# Patient Record
Sex: Female | Born: 1935 | Race: White | Hispanic: No | Smoking: Former smoker
Health system: Southern US, Community
[De-identification: ages and names within clinical notes are randomized; demographics above are authoritative.]

## PROBLEM LIST (undated history)

## (undated) DIAGNOSIS — I4891 Unspecified atrial fibrillation: Secondary | ICD-10-CM

## (undated) DIAGNOSIS — E039 Hypothyroidism, unspecified: Secondary | ICD-10-CM

## (undated) DIAGNOSIS — IMO0002 Reserved for concepts with insufficient information to code with codable children: Secondary | ICD-10-CM

## (undated) DIAGNOSIS — K52839 Microscopic colitis, unspecified: Secondary | ICD-10-CM

## (undated) DIAGNOSIS — M810 Age-related osteoporosis without current pathological fracture: Secondary | ICD-10-CM

## (undated) DIAGNOSIS — R55 Syncope and collapse: Secondary | ICD-10-CM

## (undated) DIAGNOSIS — J449 Chronic obstructive pulmonary disease, unspecified: Secondary | ICD-10-CM

## (undated) HISTORY — DX: Hypothyroidism, unspecified: E03.9

## (undated) HISTORY — DX: Reserved for concepts with insufficient information to code with codable children: IMO0002

## (undated) HISTORY — DX: Chronic obstructive pulmonary disease, unspecified: J44.9

## (undated) HISTORY — DX: Age-related osteoporosis without current pathological fracture: M81.0

## (undated) HISTORY — PX: LIPOMA EXCISION: SHX5283

## (undated) HISTORY — PX: MULTIPLE TOOTH EXTRACTIONS: SHX2053

## (undated) HISTORY — DX: Unspecified atrial fibrillation: I48.91

## (undated) HISTORY — DX: Syncope and collapse: R55

## (undated) HISTORY — PX: REFRACTIVE SURGERY: SHX103

## (undated) HISTORY — DX: Microscopic colitis, unspecified: K52.839

## (undated) HISTORY — PX: CATARACT EXTRACTION: SUR2

## (undated) HISTORY — PX: APPENDECTOMY: SHX54

## (undated) HISTORY — PX: COLONOSCOPY: SHX174

---

## 2010-06-14 LAB — HM COLONOSCOPY

## 2011-09-18 DIAGNOSIS — I872 Venous insufficiency (chronic) (peripheral): Secondary | ICD-10-CM | POA: Diagnosis not present

## 2011-09-18 DIAGNOSIS — L97809 Non-pressure chronic ulcer of other part of unspecified lower leg with unspecified severity: Secondary | ICD-10-CM | POA: Diagnosis not present

## 2011-09-18 DIAGNOSIS — E039 Hypothyroidism, unspecified: Secondary | ICD-10-CM | POA: Diagnosis not present

## 2011-09-18 DIAGNOSIS — Z7901 Long term (current) use of anticoagulants: Secondary | ICD-10-CM | POA: Diagnosis not present

## 2011-09-18 DIAGNOSIS — J449 Chronic obstructive pulmonary disease, unspecified: Secondary | ICD-10-CM | POA: Diagnosis not present

## 2011-09-18 DIAGNOSIS — I4891 Unspecified atrial fibrillation: Secondary | ICD-10-CM | POA: Diagnosis not present

## 2011-09-18 DIAGNOSIS — Z79899 Other long term (current) drug therapy: Secondary | ICD-10-CM | POA: Diagnosis not present

## 2011-09-20 DIAGNOSIS — L98499 Non-pressure chronic ulcer of skin of other sites with unspecified severity: Secondary | ICD-10-CM | POA: Diagnosis not present

## 2011-09-20 DIAGNOSIS — I70219 Atherosclerosis of native arteries of extremities with intermittent claudication, unspecified extremity: Secondary | ICD-10-CM | POA: Diagnosis not present

## 2011-09-20 DIAGNOSIS — I739 Peripheral vascular disease, unspecified: Secondary | ICD-10-CM | POA: Diagnosis not present

## 2011-09-24 DIAGNOSIS — J449 Chronic obstructive pulmonary disease, unspecified: Secondary | ICD-10-CM | POA: Diagnosis not present

## 2011-09-24 DIAGNOSIS — I87319 Chronic venous hypertension (idiopathic) with ulcer of unspecified lower extremity: Secondary | ICD-10-CM | POA: Diagnosis not present

## 2011-09-24 DIAGNOSIS — I872 Venous insufficiency (chronic) (peripheral): Secondary | ICD-10-CM | POA: Diagnosis not present

## 2011-09-24 DIAGNOSIS — Z79899 Other long term (current) drug therapy: Secondary | ICD-10-CM | POA: Diagnosis not present

## 2011-09-24 DIAGNOSIS — L97909 Non-pressure chronic ulcer of unspecified part of unspecified lower leg with unspecified severity: Secondary | ICD-10-CM | POA: Diagnosis not present

## 2011-09-24 DIAGNOSIS — L97809 Non-pressure chronic ulcer of other part of unspecified lower leg with unspecified severity: Secondary | ICD-10-CM | POA: Diagnosis not present

## 2011-09-24 DIAGNOSIS — I4891 Unspecified atrial fibrillation: Secondary | ICD-10-CM | POA: Diagnosis not present

## 2011-09-24 DIAGNOSIS — E039 Hypothyroidism, unspecified: Secondary | ICD-10-CM | POA: Diagnosis not present

## 2011-09-26 DIAGNOSIS — L909 Atrophic disorder of skin, unspecified: Secondary | ICD-10-CM | POA: Diagnosis not present

## 2011-09-26 DIAGNOSIS — D2310 Other benign neoplasm of skin of unspecified eyelid, including canthus: Secondary | ICD-10-CM | POA: Diagnosis not present

## 2011-09-26 DIAGNOSIS — L821 Other seborrheic keratosis: Secondary | ICD-10-CM | POA: Diagnosis not present

## 2011-09-26 DIAGNOSIS — L578 Other skin changes due to chronic exposure to nonionizing radiation: Secondary | ICD-10-CM | POA: Diagnosis not present

## 2011-09-26 DIAGNOSIS — L919 Hypertrophic disorder of the skin, unspecified: Secondary | ICD-10-CM | POA: Diagnosis not present

## 2011-10-01 DIAGNOSIS — L97809 Non-pressure chronic ulcer of other part of unspecified lower leg with unspecified severity: Secondary | ICD-10-CM | POA: Diagnosis not present

## 2011-10-01 DIAGNOSIS — I872 Venous insufficiency (chronic) (peripheral): Secondary | ICD-10-CM | POA: Diagnosis not present

## 2011-10-01 DIAGNOSIS — L97909 Non-pressure chronic ulcer of unspecified part of unspecified lower leg with unspecified severity: Secondary | ICD-10-CM | POA: Diagnosis not present

## 2011-10-01 DIAGNOSIS — Z79899 Other long term (current) drug therapy: Secondary | ICD-10-CM | POA: Diagnosis not present

## 2011-10-01 DIAGNOSIS — J449 Chronic obstructive pulmonary disease, unspecified: Secondary | ICD-10-CM | POA: Diagnosis not present

## 2011-10-01 DIAGNOSIS — I4891 Unspecified atrial fibrillation: Secondary | ICD-10-CM | POA: Diagnosis not present

## 2011-10-01 DIAGNOSIS — E039 Hypothyroidism, unspecified: Secondary | ICD-10-CM | POA: Diagnosis not present

## 2011-10-01 DIAGNOSIS — I87319 Chronic venous hypertension (idiopathic) with ulcer of unspecified lower extremity: Secondary | ICD-10-CM | POA: Diagnosis not present

## 2011-10-02 DIAGNOSIS — Z7901 Long term (current) use of anticoagulants: Secondary | ICD-10-CM | POA: Diagnosis not present

## 2011-10-08 DIAGNOSIS — Z79899 Other long term (current) drug therapy: Secondary | ICD-10-CM | POA: Diagnosis not present

## 2011-10-08 DIAGNOSIS — J449 Chronic obstructive pulmonary disease, unspecified: Secondary | ICD-10-CM | POA: Diagnosis not present

## 2011-10-08 DIAGNOSIS — L97809 Non-pressure chronic ulcer of other part of unspecified lower leg with unspecified severity: Secondary | ICD-10-CM | POA: Diagnosis not present

## 2011-10-08 DIAGNOSIS — I872 Venous insufficiency (chronic) (peripheral): Secondary | ICD-10-CM | POA: Diagnosis not present

## 2011-10-08 DIAGNOSIS — Z7901 Long term (current) use of anticoagulants: Secondary | ICD-10-CM | POA: Diagnosis not present

## 2011-10-08 DIAGNOSIS — I4891 Unspecified atrial fibrillation: Secondary | ICD-10-CM | POA: Diagnosis not present

## 2011-10-08 DIAGNOSIS — E039 Hypothyroidism, unspecified: Secondary | ICD-10-CM | POA: Diagnosis not present

## 2011-10-15 DIAGNOSIS — Z79899 Other long term (current) drug therapy: Secondary | ICD-10-CM | POA: Diagnosis not present

## 2011-10-15 DIAGNOSIS — J449 Chronic obstructive pulmonary disease, unspecified: Secondary | ICD-10-CM | POA: Diagnosis not present

## 2011-10-15 DIAGNOSIS — L97909 Non-pressure chronic ulcer of unspecified part of unspecified lower leg with unspecified severity: Secondary | ICD-10-CM | POA: Diagnosis not present

## 2011-10-15 DIAGNOSIS — I87319 Chronic venous hypertension (idiopathic) with ulcer of unspecified lower extremity: Secondary | ICD-10-CM | POA: Diagnosis not present

## 2011-10-15 DIAGNOSIS — I4891 Unspecified atrial fibrillation: Secondary | ICD-10-CM | POA: Diagnosis not present

## 2011-10-15 DIAGNOSIS — E039 Hypothyroidism, unspecified: Secondary | ICD-10-CM | POA: Diagnosis not present

## 2011-10-15 DIAGNOSIS — L97809 Non-pressure chronic ulcer of other part of unspecified lower leg with unspecified severity: Secondary | ICD-10-CM | POA: Diagnosis not present

## 2011-10-15 DIAGNOSIS — I872 Venous insufficiency (chronic) (peripheral): Secondary | ICD-10-CM | POA: Diagnosis not present

## 2011-10-16 DIAGNOSIS — Z7901 Long term (current) use of anticoagulants: Secondary | ICD-10-CM | POA: Diagnosis not present

## 2011-10-17 DIAGNOSIS — H26499 Other secondary cataract, unspecified eye: Secondary | ICD-10-CM | POA: Diagnosis not present

## 2011-10-30 DIAGNOSIS — J209 Acute bronchitis, unspecified: Secondary | ICD-10-CM | POA: Diagnosis not present

## 2011-10-30 DIAGNOSIS — R634 Abnormal weight loss: Secondary | ICD-10-CM | POA: Diagnosis not present

## 2011-11-15 DIAGNOSIS — R0602 Shortness of breath: Secondary | ICD-10-CM | POA: Diagnosis not present

## 2011-11-15 DIAGNOSIS — J4489 Other specified chronic obstructive pulmonary disease: Secondary | ICD-10-CM | POA: Insufficient documentation

## 2011-11-15 DIAGNOSIS — I4891 Unspecified atrial fibrillation: Secondary | ICD-10-CM | POA: Diagnosis not present

## 2011-11-20 DIAGNOSIS — R634 Abnormal weight loss: Secondary | ICD-10-CM | POA: Diagnosis not present

## 2011-11-20 DIAGNOSIS — R040 Epistaxis: Secondary | ICD-10-CM | POA: Diagnosis not present

## 2011-11-20 DIAGNOSIS — R7401 Elevation of levels of liver transaminase levels: Secondary | ICD-10-CM | POA: Diagnosis not present

## 2011-11-20 DIAGNOSIS — E039 Hypothyroidism, unspecified: Secondary | ICD-10-CM | POA: Diagnosis not present

## 2011-11-29 DIAGNOSIS — I4891 Unspecified atrial fibrillation: Secondary | ICD-10-CM | POA: Diagnosis not present

## 2011-12-13 DIAGNOSIS — I4891 Unspecified atrial fibrillation: Secondary | ICD-10-CM | POA: Diagnosis not present

## 2011-12-27 DIAGNOSIS — I83009 Varicose veins of unspecified lower extremity with ulcer of unspecified site: Secondary | ICD-10-CM | POA: Diagnosis not present

## 2011-12-30 DIAGNOSIS — I8000 Phlebitis and thrombophlebitis of superficial vessels of unspecified lower extremity: Secondary | ICD-10-CM | POA: Diagnosis not present

## 2011-12-30 DIAGNOSIS — I83893 Varicose veins of bilateral lower extremities with other complications: Secondary | ICD-10-CM | POA: Diagnosis not present

## 2012-01-10 DIAGNOSIS — I83893 Varicose veins of bilateral lower extremities with other complications: Secondary | ICD-10-CM | POA: Diagnosis not present

## 2012-01-10 DIAGNOSIS — I4891 Unspecified atrial fibrillation: Secondary | ICD-10-CM | POA: Diagnosis not present

## 2012-02-04 DIAGNOSIS — I83893 Varicose veins of bilateral lower extremities with other complications: Secondary | ICD-10-CM | POA: Diagnosis not present

## 2012-02-25 DIAGNOSIS — E039 Hypothyroidism, unspecified: Secondary | ICD-10-CM | POA: Diagnosis not present

## 2012-04-20 DIAGNOSIS — I4891 Unspecified atrial fibrillation: Secondary | ICD-10-CM | POA: Diagnosis not present

## 2012-04-23 DIAGNOSIS — B351 Tinea unguium: Secondary | ICD-10-CM | POA: Diagnosis not present

## 2012-04-23 DIAGNOSIS — M79609 Pain in unspecified limb: Secondary | ICD-10-CM | POA: Diagnosis not present

## 2012-04-27 DIAGNOSIS — I4891 Unspecified atrial fibrillation: Secondary | ICD-10-CM | POA: Diagnosis not present

## 2012-05-06 DIAGNOSIS — I4891 Unspecified atrial fibrillation: Secondary | ICD-10-CM | POA: Diagnosis not present

## 2012-05-14 DIAGNOSIS — I4891 Unspecified atrial fibrillation: Secondary | ICD-10-CM | POA: Diagnosis not present

## 2012-05-28 DIAGNOSIS — I4891 Unspecified atrial fibrillation: Secondary | ICD-10-CM | POA: Diagnosis not present

## 2012-06-01 DIAGNOSIS — E559 Vitamin D deficiency, unspecified: Secondary | ICD-10-CM | POA: Diagnosis not present

## 2012-06-01 DIAGNOSIS — E039 Hypothyroidism, unspecified: Secondary | ICD-10-CM | POA: Diagnosis not present

## 2012-06-01 DIAGNOSIS — E782 Mixed hyperlipidemia: Secondary | ICD-10-CM | POA: Diagnosis not present

## 2012-06-01 DIAGNOSIS — Z23 Encounter for immunization: Secondary | ICD-10-CM | POA: Diagnosis not present

## 2012-06-01 DIAGNOSIS — Z0000 Encounter for general adult medical examination without abnormal findings: Secondary | ICD-10-CM | POA: Diagnosis not present

## 2012-06-01 LAB — LIPID PANEL
Cholesterol: 231 mg/dL — AB (ref 0–200)
HDL: 88 mg/dL — AB (ref 35–70)
LDL Cholesterol: 124 mg/dL

## 2012-06-01 LAB — TSH: TSH: 2.35 u[IU]/mL (ref 0.41–5.90)

## 2012-06-02 DIAGNOSIS — M899 Disorder of bone, unspecified: Secondary | ICD-10-CM | POA: Diagnosis not present

## 2012-06-02 DIAGNOSIS — Z13820 Encounter for screening for osteoporosis: Secondary | ICD-10-CM | POA: Diagnosis not present

## 2012-06-12 DIAGNOSIS — I4891 Unspecified atrial fibrillation: Secondary | ICD-10-CM | POA: Diagnosis not present

## 2012-07-02 DIAGNOSIS — I4891 Unspecified atrial fibrillation: Secondary | ICD-10-CM | POA: Diagnosis not present

## 2012-07-09 DIAGNOSIS — Z23 Encounter for immunization: Secondary | ICD-10-CM | POA: Diagnosis not present

## 2012-07-13 DIAGNOSIS — Z1231 Encounter for screening mammogram for malignant neoplasm of breast: Secondary | ICD-10-CM | POA: Diagnosis not present

## 2012-07-13 LAB — HM MAMMOGRAPHY: HM Mammogram: NORMAL

## 2012-07-14 DIAGNOSIS — I8000 Phlebitis and thrombophlebitis of superficial vessels of unspecified lower extremity: Secondary | ICD-10-CM | POA: Diagnosis not present

## 2012-07-14 DIAGNOSIS — I83893 Varicose veins of bilateral lower extremities with other complications: Secondary | ICD-10-CM | POA: Diagnosis not present

## 2012-07-23 DIAGNOSIS — D45 Polycythemia vera: Secondary | ICD-10-CM | POA: Diagnosis not present

## 2012-07-28 DIAGNOSIS — I70219 Atherosclerosis of native arteries of extremities with intermittent claudication, unspecified extremity: Secondary | ICD-10-CM | POA: Diagnosis not present

## 2012-07-28 DIAGNOSIS — I8000 Phlebitis and thrombophlebitis of superficial vessels of unspecified lower extremity: Secondary | ICD-10-CM | POA: Diagnosis not present

## 2012-07-28 DIAGNOSIS — I83893 Varicose veins of bilateral lower extremities with other complications: Secondary | ICD-10-CM | POA: Diagnosis not present

## 2012-07-28 DIAGNOSIS — L97909 Non-pressure chronic ulcer of unspecified part of unspecified lower leg with unspecified severity: Secondary | ICD-10-CM | POA: Diagnosis not present

## 2012-07-28 DIAGNOSIS — I83009 Varicose veins of unspecified lower extremity with ulcer of unspecified site: Secondary | ICD-10-CM | POA: Diagnosis not present

## 2012-07-30 DIAGNOSIS — I4891 Unspecified atrial fibrillation: Secondary | ICD-10-CM | POA: Diagnosis not present

## 2012-08-05 DIAGNOSIS — H903 Sensorineural hearing loss, bilateral: Secondary | ICD-10-CM | POA: Diagnosis not present

## 2012-08-05 DIAGNOSIS — H905 Unspecified sensorineural hearing loss: Secondary | ICD-10-CM | POA: Diagnosis not present

## 2012-08-21 DIAGNOSIS — H01019 Ulcerative blepharitis unspecified eye, unspecified eyelid: Secondary | ICD-10-CM | POA: Diagnosis not present

## 2012-08-27 DIAGNOSIS — I4891 Unspecified atrial fibrillation: Secondary | ICD-10-CM | POA: Diagnosis not present

## 2012-09-24 DIAGNOSIS — D230 Other benign neoplasm of skin of lip: Secondary | ICD-10-CM | POA: Diagnosis not present

## 2012-09-24 DIAGNOSIS — L578 Other skin changes due to chronic exposure to nonionizing radiation: Secondary | ICD-10-CM | POA: Diagnosis not present

## 2012-09-24 DIAGNOSIS — Z85828 Personal history of other malignant neoplasm of skin: Secondary | ICD-10-CM | POA: Diagnosis not present

## 2012-09-24 DIAGNOSIS — I4891 Unspecified atrial fibrillation: Secondary | ICD-10-CM | POA: Diagnosis not present

## 2012-09-24 DIAGNOSIS — L821 Other seborrheic keratosis: Secondary | ICD-10-CM | POA: Diagnosis not present

## 2012-09-30 DIAGNOSIS — M79609 Pain in unspecified limb: Secondary | ICD-10-CM | POA: Diagnosis not present

## 2012-09-30 DIAGNOSIS — L03039 Cellulitis of unspecified toe: Secondary | ICD-10-CM | POA: Diagnosis not present

## 2012-10-09 DIAGNOSIS — T1510XA Foreign body in conjunctival sac, unspecified eye, initial encounter: Secondary | ICD-10-CM | POA: Diagnosis not present

## 2012-10-20 DIAGNOSIS — H524 Presbyopia: Secondary | ICD-10-CM | POA: Diagnosis not present

## 2012-10-20 DIAGNOSIS — H26499 Other secondary cataract, unspecified eye: Secondary | ICD-10-CM | POA: Diagnosis not present

## 2012-10-22 DIAGNOSIS — I4891 Unspecified atrial fibrillation: Secondary | ICD-10-CM | POA: Diagnosis not present

## 2012-10-22 DIAGNOSIS — Z7901 Long term (current) use of anticoagulants: Secondary | ICD-10-CM | POA: Diagnosis not present

## 2012-11-05 DIAGNOSIS — R0602 Shortness of breath: Secondary | ICD-10-CM | POA: Diagnosis not present

## 2012-11-05 DIAGNOSIS — J449 Chronic obstructive pulmonary disease, unspecified: Secondary | ICD-10-CM | POA: Diagnosis not present

## 2012-11-05 DIAGNOSIS — I4891 Unspecified atrial fibrillation: Secondary | ICD-10-CM | POA: Diagnosis not present

## 2012-11-17 DIAGNOSIS — E039 Hypothyroidism, unspecified: Secondary | ICD-10-CM | POA: Diagnosis not present

## 2012-11-17 DIAGNOSIS — M79609 Pain in unspecified limb: Secondary | ICD-10-CM | POA: Diagnosis not present

## 2012-11-17 DIAGNOSIS — R209 Unspecified disturbances of skin sensation: Secondary | ICD-10-CM | POA: Diagnosis not present

## 2012-11-17 LAB — HEPATIC FUNCTION PANEL: ALT: 33 U/L (ref 7–35)

## 2012-11-17 LAB — BASIC METABOLIC PANEL: Creatinine: 0.7 mg/dL (ref 0.5–1.1)

## 2012-11-18 DIAGNOSIS — R0602 Shortness of breath: Secondary | ICD-10-CM | POA: Diagnosis not present

## 2012-11-25 DIAGNOSIS — Z7901 Long term (current) use of anticoagulants: Secondary | ICD-10-CM | POA: Diagnosis not present

## 2012-11-25 DIAGNOSIS — I4891 Unspecified atrial fibrillation: Secondary | ICD-10-CM | POA: Diagnosis not present

## 2012-12-04 DIAGNOSIS — Z7901 Long term (current) use of anticoagulants: Secondary | ICD-10-CM | POA: Diagnosis not present

## 2012-12-04 DIAGNOSIS — I4891 Unspecified atrial fibrillation: Secondary | ICD-10-CM | POA: Diagnosis not present

## 2012-12-08 DIAGNOSIS — R0602 Shortness of breath: Secondary | ICD-10-CM | POA: Diagnosis not present

## 2012-12-08 DIAGNOSIS — D45 Polycythemia vera: Secondary | ICD-10-CM | POA: Diagnosis not present

## 2012-12-22 DIAGNOSIS — D45 Polycythemia vera: Secondary | ICD-10-CM | POA: Diagnosis not present

## 2013-01-04 DIAGNOSIS — I4891 Unspecified atrial fibrillation: Secondary | ICD-10-CM | POA: Diagnosis not present

## 2013-01-04 DIAGNOSIS — Z7901 Long term (current) use of anticoagulants: Secondary | ICD-10-CM | POA: Diagnosis not present

## 2013-01-15 DIAGNOSIS — S81809A Unspecified open wound, unspecified lower leg, initial encounter: Secondary | ICD-10-CM | POA: Diagnosis not present

## 2013-01-19 DIAGNOSIS — I83009 Varicose veins of unspecified lower extremity with ulcer of unspecified site: Secondary | ICD-10-CM | POA: Diagnosis not present

## 2013-01-19 DIAGNOSIS — I8390 Asymptomatic varicose veins of unspecified lower extremity: Secondary | ICD-10-CM | POA: Diagnosis not present

## 2013-01-19 DIAGNOSIS — L97909 Non-pressure chronic ulcer of unspecified part of unspecified lower leg with unspecified severity: Secondary | ICD-10-CM | POA: Diagnosis not present

## 2013-01-25 DIAGNOSIS — I4891 Unspecified atrial fibrillation: Secondary | ICD-10-CM | POA: Diagnosis not present

## 2013-01-25 DIAGNOSIS — J449 Chronic obstructive pulmonary disease, unspecified: Secondary | ICD-10-CM | POA: Diagnosis not present

## 2013-01-25 DIAGNOSIS — E785 Hyperlipidemia, unspecified: Secondary | ICD-10-CM | POA: Diagnosis not present

## 2013-01-25 DIAGNOSIS — Z7901 Long term (current) use of anticoagulants: Secondary | ICD-10-CM | POA: Diagnosis not present

## 2013-02-05 DIAGNOSIS — L97909 Non-pressure chronic ulcer of unspecified part of unspecified lower leg with unspecified severity: Secondary | ICD-10-CM | POA: Diagnosis not present

## 2013-02-19 DIAGNOSIS — L97909 Non-pressure chronic ulcer of unspecified part of unspecified lower leg with unspecified severity: Secondary | ICD-10-CM | POA: Diagnosis not present

## 2013-02-19 DIAGNOSIS — J40 Bronchitis, not specified as acute or chronic: Secondary | ICD-10-CM | POA: Diagnosis not present

## 2013-02-22 DIAGNOSIS — Z7901 Long term (current) use of anticoagulants: Secondary | ICD-10-CM | POA: Diagnosis not present

## 2013-02-22 DIAGNOSIS — I4891 Unspecified atrial fibrillation: Secondary | ICD-10-CM | POA: Diagnosis not present

## 2013-03-03 DIAGNOSIS — Z7901 Long term (current) use of anticoagulants: Secondary | ICD-10-CM | POA: Diagnosis not present

## 2013-03-03 DIAGNOSIS — I4891 Unspecified atrial fibrillation: Secondary | ICD-10-CM | POA: Diagnosis not present

## 2013-03-15 DIAGNOSIS — E785 Hyperlipidemia, unspecified: Secondary | ICD-10-CM | POA: Diagnosis not present

## 2013-03-15 DIAGNOSIS — Z7901 Long term (current) use of anticoagulants: Secondary | ICD-10-CM | POA: Diagnosis not present

## 2013-03-15 DIAGNOSIS — J449 Chronic obstructive pulmonary disease, unspecified: Secondary | ICD-10-CM | POA: Diagnosis not present

## 2013-03-15 DIAGNOSIS — I4891 Unspecified atrial fibrillation: Secondary | ICD-10-CM | POA: Diagnosis not present

## 2013-03-29 DIAGNOSIS — Z7901 Long term (current) use of anticoagulants: Secondary | ICD-10-CM | POA: Diagnosis not present

## 2013-04-13 ENCOUNTER — Telehealth: Payer: Self-pay

## 2013-04-13 ENCOUNTER — Encounter: Payer: Self-pay | Admitting: Family Medicine

## 2013-04-13 ENCOUNTER — Ambulatory Visit (INDEPENDENT_AMBULATORY_CARE_PROVIDER_SITE_OTHER): Payer: Medicare Other | Admitting: Family Medicine

## 2013-04-13 VITALS — BP 96/62 | HR 84 | Temp 97.3°F | Ht 69.25 in | Wt 126.0 lb

## 2013-04-13 DIAGNOSIS — E039 Hypothyroidism, unspecified: Secondary | ICD-10-CM | POA: Diagnosis not present

## 2013-04-13 DIAGNOSIS — Z66 Do not resuscitate: Secondary | ICD-10-CM | POA: Diagnosis not present

## 2013-04-13 DIAGNOSIS — I4891 Unspecified atrial fibrillation: Secondary | ICD-10-CM | POA: Diagnosis not present

## 2013-04-13 DIAGNOSIS — J449 Chronic obstructive pulmonary disease, unspecified: Secondary | ICD-10-CM | POA: Insufficient documentation

## 2013-04-13 DIAGNOSIS — J4489 Other specified chronic obstructive pulmonary disease: Secondary | ICD-10-CM

## 2013-04-13 NOTE — Progress Notes (Signed)
Subjective:    Patient ID: [name], female    DOB: [birth year], 79 y.o.   MRN: [id number]  HPI  Very pleasant 79 yo female here to establish care.  Moved to [place] earlier this month from [place], [place].  Her husband died last 03-11-23.  Wants to be closer to her only son who lives in [place]. [place] she is adjusting ok but feels "frazzled" still.  Afib- was never symptomatic.  Diagnosed during pre op clearance for cataracts surgery in 2010.  Has been on digoxin and coumadin since. Last INR per pt was 2.2 on 03/29/2013. Denies any CP, SOB or palpitations.  Hypothyroidism- TSH 2.350 in 05/2013.  Has been on same dose of synthroid for "as long as she can remember." Denies any symptoms of hypo or hyperthyroidism.  COPD- former smoker.  Has been on advair for years.  Typically not short of breath unless she really exerts herself.  She does want to be a DNR- asking for "yellow forms."  Patient Active Problem List   Diagnosis Date Noted  . DNR (do not resuscitate) 04/13/2013  . Atrial fibrillation   . COPD (chronic obstructive pulmonary disease)   . Hypothyroidism    Past Medical History  Diagnosis Date  . Atrial fibrillation   . COPD (chronic obstructive pulmonary disease)   . Hypothyroidism    Past Surgical History  Procedure Laterality Date  . Appendectomy     History  Substance Use Topics  . Smoking status: Former Games developer  . Smokeless tobacco: Not on file  . Alcohol Use: Not on file   Family History  Problem Relation Age of Onset  . Alzheimer's disease Mother   . Cancer Brother 22    bladder   Allergies  Allergen Reactions  . Sulfa Antibiotics Nausea And Vomiting   No current outpatient prescriptions on file prior to visit.   No current facility-administered medications on file prior to visit.   The PMH, PSH, Social History, Family History, Medications, and allergies have been reviewed in [facility], and have been updated if relevant.  Review of Systems See  HPI Denies any changes in her bowel habits No worsening anxiety Denies depression    Objective:   Physical Exam BP 96/62  Pulse 84  Temp(Src) 97.3 F (36.3 C)  Ht 5' 9.25" (1.759 m)  Wt 126 lb (57.153 kg)  BMI 18.47 kg/m2  General:  Well-developed,well-nourished,in no acute distress; alert,appropriate and cooperative throughout examination Head:  normocephalic and atraumatic.   Eyes:  vision grossly intact, pupils equal, pupils round, and pupils reactive to light.   Ears:  R ear normal and L ear normal.   Nose:  no external deformity.   Mouth:  good dentition.   Lungs:  Normal respiratory effort, chest expands symmetrically. Lungs are clear to auscultation, no crackles or wheezes. Heart:  Normal rate and regular rhythm. S1 and S2 normal without gallop, murmur, click, rub or other extra sounds. Abdomen:  Bowel sounds positive,abdomen soft and non-tender without masses, organomegaly or hernias noted. Msk:  No deformity or scoliosis noted of thoracic or lumbar spine.   Extremities:  No clubbing, cyanosis, edema, or deformity noted with normal full range of motion of all joints.   Neurologic:  alert & oriented X3 and gait normal.   Skin:  Intact without suspicious lesions or rashes Cervical Nodes:  No lymphadenopathy noted Psych:  Cognition and judgment appear intact. Alert and cooperative with normal attention span and concentration. No apparent delusions, illusions, hallucinations  Assessment & Plan:  1. DNR (do not resuscitate) Discussed with pt- forms signed, returned to pt and chart updated.  2. Atrial fibrillation Rate and rhythm controlled.  Continue dig and coumadin.  Refer to Dr. Mariah Milling to establish care. - Ambulatory referral to Cardiology  3. COPD (chronic obstructive pulmonary disease) Well controlled on low dose Advair.  4. Hypothyroidism Continue current dose of synthroid.  Will recheck at her medicare wellness visit. The patient indicates understanding of  these issues and agrees with the plan.

## 2013-04-13 NOTE — Patient Instructions (Addendum)
It was nice to meet you. Please stop by to see [name] on your way out to set up your referral.  Please schedule a wellness visit in 05/2013.

## 2013-04-13 NOTE — Telephone Encounter (Signed)
Pt was seen today and forgot to put Advair 100/50 taking 1 puff daily pt request med to be added to med list. Done.

## 2013-04-14 ENCOUNTER — Encounter: Payer: Self-pay | Admitting: *Deleted

## 2013-04-14 ENCOUNTER — Ambulatory Visit (INDEPENDENT_AMBULATORY_CARE_PROVIDER_SITE_OTHER): Payer: Medicare Other | Admitting: Cardiovascular Disease

## 2013-04-14 ENCOUNTER — Ambulatory Visit: Payer: Self-pay | Admitting: Family Medicine

## 2013-04-14 VITALS — BP 102/82 | HR 77 | Ht 69.0 in | Wt 129.5 lb

## 2013-04-14 DIAGNOSIS — J449 Chronic obstructive pulmonary disease, unspecified: Secondary | ICD-10-CM | POA: Diagnosis not present

## 2013-04-14 DIAGNOSIS — I4891 Unspecified atrial fibrillation: Secondary | ICD-10-CM | POA: Diagnosis not present

## 2013-04-14 DIAGNOSIS — R2681 Unsteadiness on feet: Secondary | ICD-10-CM | POA: Insufficient documentation

## 2013-04-14 DIAGNOSIS — R269 Unspecified abnormalities of gait and mobility: Secondary | ICD-10-CM

## 2013-04-14 NOTE — Assessment & Plan Note (Signed)
Long history of smoking. She states it is stable. She is currently on Advair

## 2013-04-14 NOTE — Assessment & Plan Note (Signed)
Chronic atrial fibrillation. Discussion family to all of her old records including notes from Dr. Lyn Hollingshead, several prior echocardiograms. Each note indicates she was in atrial fibrillation. She has severely dilated left and right atria. He will likely be no way we would be able to get her back to normal rhythm and maintained that rhythm given her dilated atria. Encouraged her to stay on warfarin, verapamil and digoxin. No other medication changes made.  Unable to manage her INR through fax is from [facility]. We have suggested either she come to our Wednesday Coumadin clinic or have the Coumadin clinic at primary care check her INR (to avoid our subspecialty co-pay). She does not want to come to our Coumadin clinic and prefers to have this monitored through [facility], and I'm assuming primary care. We'll try to call the Coumadin clinic at [facility].

## 2013-04-14 NOTE — Assessment & Plan Note (Addendum)
She states that she feels wobbly at times. No recent falls. Encouraged her to continue her exercise 3 times a week. She is meeting with a trainer this week at [facility].

## 2013-04-14 NOTE — Patient Instructions (Addendum)
You are doing well. No medication changes were made.  Please call us if you have new issues that need to be addressed before your next appt.  Your physician wants you to follow-up in: 6 months.  You will receive a reminder letter in the mail two months in advance. If you don't receive a letter, please call our office to schedule the follow-up appointment.   

## 2013-04-14 NOTE — Progress Notes (Signed)
Patient ID: [name], female    DOB: 1932/08/07, 79 y.o.   MRN: [id number]  HPI Comments: [name] is a 79 year old woman who is new to the area chronic atrial fibrillation, COPD, 40 years of smoking, on warfarin and rate control medications who presents to establish care in the [place] office.  She seems to be very confused as to whether she is in atrial fibrillation. She states that her prior cardiologist did not mention this to her and primary care in the past indicating she might be in normal rhythm.  Prior cardiac notes from Dr. Lyn Hollingshead in [place] indicate she has chronic atrial fibrillation, physical exam indicating a regular rhythm, echocardiogram in 2010 and March 2014 confirming atrial fibrillation. No old EKGs are available.  She is relatively asymptomatic, no palpitations, shortness of breath. Occasional fatigue with exertion. Difficulty keeping up with her grandchildren She is bothered by the warfarin and has significant lower extremity bruising. No recent falls. She denies any dizziness or symptoms concerning for orthostasis.   Echocardiogram from March 2014 shows normal LV systolic function, severely dilated left and right atrium, mildly elevated right ventricular systolic pressure estimated at 35 mm of mercury   Outpatient Encounter Prescriptions as of 04/14/2013  Medication Sig Dispense Refill  . Cholecalciferol (VITAMIN D-3) 1000 UNITS CAPS Take by mouth.      . digoxin (LANOXIN) 0.125 MG tablet Take 0.125 mg by mouth daily.      . diphenhydrAMINE (BENADRYL) 25 MG tablet Take 25 mg by mouth as needed for sleep.       Marland Kitchen Fluticasone-Salmeterol (ADVAIR DISKUS) 100-50 MCG/DOSE AEPB Inhale 1 puff into the lungs daily.      Marland Kitchen levothyroxine (SYNTHROID, LEVOTHROID) 50 MCG tablet Take one tablet by mouth one day, then half a tablet by mouth the next day and continue to alternate.      . Melatonin 1 MG TABS Take by mouth.      . traZODone (DESYREL) 50 MG tablet Take 50 mg by  mouth at bedtime.      . verapamil (CALAN-SR) 120 MG CR tablet Take 120 mg by mouth daily.      Marland Kitchen warfarin (COUMADIN) 5 MG tablet Take one to two tablets by mouth with supper or as directed.        Review of Systems  Constitutional: Negative.   HENT: Negative.   Eyes: Negative.   Respiratory: Negative.   Cardiovascular: Negative.   Gastrointestinal: Negative.   Musculoskeletal: Negative.   Skin: Negative.   Neurological: Negative.   Psychiatric/Behavioral: Negative.   All other systems reviewed and are negative.    BP 102/82  Pulse 77  Ht 5\' 9"  (1.753 m)  Wt 129 lb 8 oz (58.741 kg)  BMI 19.12 kg/m2  Physical Exam  Nursing note and vitals reviewed. Constitutional: She is oriented to person, place, and time. She appears well-developed and well-nourished.  HENT:  Head: Normocephalic.  Nose: Nose normal.  Mouth/Throat: Oropharynx is clear and moist.  Eyes: Conjunctivae are normal. Pupils are equal, round, and reactive to light.  Neck: Normal range of motion. Neck supple. No JVD present.  Cardiovascular: Normal rate, regular rhythm, S1 normal, S2 normal, normal heart sounds and intact distal pulses.  Exam reveals no gallop and no friction rub.   No murmur heard. Pulmonary/Chest: Effort normal and breath sounds normal. No respiratory distress. She has no wheezes. She has no rales. She exhibits no tenderness.  Abdominal: Soft. Bowel sounds are normal. She exhibits no  distension. There is no tenderness.  Musculoskeletal: Normal range of motion. She exhibits no edema and no tenderness.  Lymphadenopathy:    She has no cervical adenopathy.  Neurological: She is alert and oriented to person, place, and time. Coordination normal.  Skin: Skin is warm and dry. No rash noted. No erythema.  Psychiatric: She has a normal mood and affect. Her behavior is normal. Judgment and thought content normal.    Assessment and Plan

## 2013-04-26 DIAGNOSIS — Z7901 Long term (current) use of anticoagulants: Secondary | ICD-10-CM | POA: Diagnosis not present

## 2013-04-27 ENCOUNTER — Ambulatory Visit (INDEPENDENT_AMBULATORY_CARE_PROVIDER_SITE_OTHER): Payer: Medicare Other | Admitting: Family Medicine

## 2013-04-27 LAB — POCT INR: INR: 2

## 2013-04-28 ENCOUNTER — Telehealth: Payer: Self-pay | Admitting: Family Medicine

## 2013-04-28 NOTE — Telephone Encounter (Signed)
2.0-3.0 thx

## 2013-04-28 NOTE — Telephone Encounter (Signed)
Error

## 2013-04-28 NOTE — Telephone Encounter (Signed)
Morrie Sheldon, RN from [facility] said that pt told her that her MD in [place] had given her a therapeutic range of 1.8-2.6 for her INR. I have her range between 2.0-3.0 according to her hx of afib. Could you clarify what you would like for her INR range to be so that I have it correct in the chart when she comes in to have it checked?

## 2013-05-07 ENCOUNTER — Encounter: Payer: Self-pay | Admitting: Family Medicine

## 2013-05-10 ENCOUNTER — Telehealth: Payer: Self-pay

## 2013-05-10 NOTE — Telephone Encounter (Signed)
Pt left v/m that she received bill for $63.64 for record release of information from her doctor in [place]. Before pt mails the check for $63.64 pt wants cb to verify records were received by Dr Dayton Martes.Please advise.

## 2013-05-24 DIAGNOSIS — Z7901 Long term (current) use of anticoagulants: Secondary | ICD-10-CM | POA: Diagnosis not present

## 2013-05-24 DIAGNOSIS — I4891 Unspecified atrial fibrillation: Secondary | ICD-10-CM | POA: Diagnosis not present

## 2013-05-25 ENCOUNTER — Ambulatory Visit (INDEPENDENT_AMBULATORY_CARE_PROVIDER_SITE_OTHER): Payer: Medicare Other | Admitting: Family Medicine

## 2013-06-08 ENCOUNTER — Other Ambulatory Visit: Payer: Self-pay | Admitting: Family Medicine

## 2013-06-08 DIAGNOSIS — E039 Hypothyroidism, unspecified: Secondary | ICD-10-CM

## 2013-06-08 DIAGNOSIS — Z136 Encounter for screening for cardiovascular disorders: Secondary | ICD-10-CM

## 2013-06-17 ENCOUNTER — Other Ambulatory Visit (INDEPENDENT_AMBULATORY_CARE_PROVIDER_SITE_OTHER): Payer: Medicare Other

## 2013-06-17 DIAGNOSIS — E039 Hypothyroidism, unspecified: Secondary | ICD-10-CM | POA: Diagnosis not present

## 2013-06-17 DIAGNOSIS — Z136 Encounter for screening for cardiovascular disorders: Secondary | ICD-10-CM

## 2013-06-18 LAB — COMPREHENSIVE METABOLIC PANEL
Albumin: 4.2 g/dL (ref 3.5–5.2)
Alkaline Phosphatase: 56 U/L (ref 39–117)
BUN: 10 mg/dL (ref 6–23)
CO2: 30 mEq/L (ref 19–32)
Calcium: 9.5 mg/dL (ref 8.4–10.5)
Creatinine, Ser: 0.8 mg/dL (ref 0.4–1.2)
Glucose, Bld: 87 mg/dL (ref 70–99)
Potassium: 4.6 mEq/L (ref 3.5–5.1)

## 2013-06-18 LAB — TSH: TSH: 2.78 u[IU]/mL (ref 0.35–5.50)

## 2013-06-21 DIAGNOSIS — I4891 Unspecified atrial fibrillation: Secondary | ICD-10-CM | POA: Diagnosis not present

## 2013-06-21 LAB — POCT INR: INR: 1.93

## 2013-06-22 ENCOUNTER — Encounter: Payer: Self-pay | Admitting: Family Medicine

## 2013-06-22 ENCOUNTER — Ambulatory Visit (INDEPENDENT_AMBULATORY_CARE_PROVIDER_SITE_OTHER): Payer: Medicare Other | Admitting: Family Medicine

## 2013-06-22 VITALS — BP 102/78 | HR 79 | Temp 97.7°F | Ht 70.0 in | Wt 130.2 lb

## 2013-06-22 DIAGNOSIS — Z66 Do not resuscitate: Secondary | ICD-10-CM | POA: Diagnosis not present

## 2013-06-22 DIAGNOSIS — Z0000 Encounter for general adult medical examination without abnormal findings: Secondary | ICD-10-CM | POA: Diagnosis not present

## 2013-06-22 DIAGNOSIS — J449 Chronic obstructive pulmonary disease, unspecified: Secondary | ICD-10-CM | POA: Diagnosis not present

## 2013-06-22 DIAGNOSIS — Z7901 Long term (current) use of anticoagulants: Secondary | ICD-10-CM

## 2013-06-22 DIAGNOSIS — R269 Unspecified abnormalities of gait and mobility: Secondary | ICD-10-CM

## 2013-06-22 DIAGNOSIS — Z1211 Encounter for screening for malignant neoplasm of colon: Secondary | ICD-10-CM

## 2013-06-22 DIAGNOSIS — Z1231 Encounter for screening mammogram for malignant neoplasm of breast: Secondary | ICD-10-CM

## 2013-06-22 DIAGNOSIS — E039 Hypothyroidism, unspecified: Secondary | ICD-10-CM

## 2013-06-22 DIAGNOSIS — Z23 Encounter for immunization: Secondary | ICD-10-CM | POA: Diagnosis not present

## 2013-06-22 DIAGNOSIS — R2681 Unsteadiness on feet: Secondary | ICD-10-CM

## 2013-06-22 DIAGNOSIS — I4891 Unspecified atrial fibrillation: Secondary | ICD-10-CM

## 2013-06-22 DIAGNOSIS — E785 Hyperlipidemia, unspecified: Secondary | ICD-10-CM

## 2013-06-22 NOTE — Patient Instructions (Addendum)
Please make an appointment for fasting labs at your convenience. Please stop by the lab to get your stool cards.  Please call to set up your mammogram appointment.  Calcium supplements have received some bad press lately, with questions that they may increase risk of heart attack or blood clots.  The risk is very low, however none of these risks occur with calcium in FOOD.  Try to get most or all of your calcium from your food--aim for 1200 mg/day.  To figure out dietary calcium: 300 mg/day from all non dairy foods plus 300 mg per cup of milk, other dairy, or fortified juice.  Non dairy foods that contain calcium:  Kale, oranges, sardines, oatmeal, soy milk/soybeans, salmon, white beans, dried figs, turnip greens, almonds, broccoli, tofu.

## 2013-06-22 NOTE — Addendum Note (Signed)
Addended by: Criselda Peaches B on: 06/22/2013 10:19 AM   Modules accepted: Orders

## 2013-06-22 NOTE — Progress Notes (Signed)
Subjective:    Patient ID: [name], female    DOB: 07/29/1932, 79 y.o.   MRN: [id number]  HPI  Very pleasant 79 yo female here for annual medicare wellness visit.  I have personally reviewed the Medicare Annual Wellness questionnaire and have noted 1. The patient's medical and social history 2. Their use of alcohol, tobacco or illicit drugs 3. Their current medications and supplements 4. The patient's functional ability including ADL's, fall risks, home safety risks and hearing or visual             impairment. 5. Diet and physical activities 6. Evidence for depression or mood disorders   Moved to [facility] in 03/2013 rom [place], [place].  Her husband died last 03-15-2023.  Wants to be closer to her only son who lives in [place]. [place] she is adjusting ok.  Feels everyone is great but has not met a "best friend yet- someone I can call up and go to the movies."  She is getting to see her son more frequently which makes her very happy- sees him almost weekly.  Appetite good.  Weight stable. Wt Readings from Last 3 Encounters:  06/22/13 130 lb 4 oz (59.081 kg)  04/14/13 129 lb 8 oz (58.741 kg)  04/13/13 126 lb (57.153 kg)    Afib- was never symptomatic.  Diagnosed during pre op clearance for cataracts surgery in 2010.  Has been on digoxin and coumadin since. Lab Results  Component Value Date   INR 2.5 05/25/2013   INR 2.0 04/27/2013   Denies any CP, SOB or palpitations.  Saw Dr. Mariah Milling in 03/2013.  Note reviewed. Encouraged her to stay on warfarin, verapamil and digoxin. No other medication changes made.  Hypothyroidism- .  Has been on same dose of synthroid for "as long as she can remember." Denies any symptoms of hypo or hyperthyroidism. Lab Results  Component Value Date   TSH 2.78 06/17/2013   Lab Results  Component Value Date   CREATININE 0.8 06/17/2013     COPD- former smoker.  Has been on advair for years.  Typically not short of breath unless she really exerts  herself.  She does want to be a DNR- asking for "yellow forms."  Patient Active Problem List   Diagnosis Date Noted  . Medicare annual wellness visit, subsequent 06/22/2013  . Gait instability 04/14/2013  . DNR (do not resuscitate) 04/13/2013  . Atrial fibrillation   . COPD (chronic obstructive pulmonary disease)   . Hypothyroidism    Past Medical History  Diagnosis Date  . Atrial fibrillation   . COPD (chronic obstructive pulmonary disease)   . Hypothyroidism   . Osteoporosis   . Syncope   . Squamous cell carcinoma    Past Surgical History  Procedure Laterality Date  . Appendectomy    . Lipoma excision    . Cataract extraction Bilateral   . Colonoscopy    . Multiple tooth extractions     History  Substance Use Topics  . Smoking status: Former Smoker -- 1.00 packs/day for 40 years    Types: Cigarettes  . Smokeless tobacco: Not on file  . Alcohol Use: Yes     Comment: social   Family History  Problem Relation Age of Onset  . Alzheimer's disease Mother   . Cancer Brother 55    bladder  . Brain cancer Father    Allergies  Allergen Reactions  . Sulfa Antibiotics Nausea And Vomiting   Current Outpatient Prescriptions on File Prior  to Visit  Medication Sig Dispense Refill  . Cholecalciferol (VITAMIN D-3) 1000 UNITS CAPS Take by mouth.      . digoxin (LANOXIN) 0.125 MG tablet Take 0.125 mg by mouth daily.      . diphenhydrAMINE (BENADRYL) 25 MG tablet Take 25 mg by mouth as needed for sleep.       Marland Kitchen Fluticasone-Salmeterol (ADVAIR DISKUS) 100-50 MCG/DOSE AEPB Inhale 1 puff into the lungs daily.      Marland Kitchen levothyroxine (SYNTHROID, LEVOTHROID) 50 MCG tablet Take one tablet by mouth one day, then half a tablet by mouth the next day and continue to alternate.      . Melatonin 1 MG TABS Take by mouth.      . traZODone (DESYREL) 50 MG tablet Take 50 mg by mouth at bedtime.      . verapamil (CALAN-SR) 120 MG CR tablet Take 120 mg by mouth daily.      Marland Kitchen warfarin (COUMADIN) 5  MG tablet Take one to two tablets by mouth with supper or as directed.       No current facility-administered medications on file prior to visit.   The PMH, PSH, Social History, Family History, Medications, and allergies have been reviewed in [facility], and have been updated if relevant.  Review of Systems See HPI Patient reports no  vision/ hearing changes,anorexia, weight change, fever ,adenopathy, persistant / recurrent hoarseness, swallowing issues, chest pain, edema,persistant / recurrent cough, hemoptysis, dyspnea(rest, exertional, paroxysmal nocturnal), gastrointestinal  bleeding (melena, rectal bleeding), abdominal pain, excessive heart burn, GU symptoms(dysuria, hematuria, pyuria, voiding/incontinence  Issues) syncope, focal weakness, severe memory loss, concerning skin lesions, depression, anxiety, abnormal bruising/bleeding, major joint swelling, breast masses or abnormal vaginal bleeding.       Objective:   Physical Exam BP 102/78  Pulse 79  Temp(Src) 97.7 F (36.5 C) (Oral)  Ht 5\' 10"  (1.778 m)  Wt 130 lb 4 oz (59.081 kg)  BMI 18.69 kg/m2  SpO2 94%  General:  Well-developed,well-nourished,in no acute distress; alert,appropriate and cooperative throughout examination Head:  normocephalic and atraumatic.   Eyes:  vision grossly intact, pupils equal, pupils round, and pupils reactive to light.   Ears:  R ear normal and L ear normal.   Nose:  no external deformity.   Mouth:  good dentition.   Lungs:  Normal respiratory effort, chest expands symmetrically. Lungs are clear to auscultation, no crackles or wheezes. Heart:  Normal rate and regular rhythm. S1 and S2 normal without gallop, murmur, click, rub or other extra sounds. Abdomen:  Bowel sounds positive,abdomen soft and non-tender without masses, organomegaly or hernias noted. Msk:  No deformity or scoliosis noted of thoracic or lumbar spine.   Extremities:  No clubbing, cyanosis, edema, or deformity noted with normal full range  of motion of all joints.   Neurologic:  alert & oriented X3 and gait normal.   Skin:  Intact without suspicious lesions or rashes Cervical Nodes:  No lymphadenopathy noted Psych:  Cognition and judgment appear intact. Alert and cooperative with normal attention span and concentration. No apparent delusions, illusions, hallucinations     Assessment & Plan:   1. Medicare annual wellness visit, subsequent The patients weight, height, BMI and visual acuity have been recorded in the chart I have made referrals, counseling and provided education to the patient based review of the above and I have provided the pt with a written personalized care plan for preventive services. Flu shot today. IFOB Set up mammogram Bone density next year.  2.  Atrial fibrillation Rate and rhythm controlled.  Continue current x.   3. COPD (chronic obstructive pulmonary disease) Well controlled on low dose Advair.  4. Hypothyroidism Continue current dose of synthroid.

## 2013-07-08 ENCOUNTER — Other Ambulatory Visit (INDEPENDENT_AMBULATORY_CARE_PROVIDER_SITE_OTHER): Payer: Medicare Other

## 2013-07-08 DIAGNOSIS — E785 Hyperlipidemia, unspecified: Secondary | ICD-10-CM | POA: Diagnosis not present

## 2013-07-08 LAB — LIPID PANEL
Cholesterol: 225 mg/dL — ABNORMAL HIGH (ref 0–200)
HDL: 86.8 mg/dL (ref >39.00)

## 2013-07-08 LAB — LDL CHOLESTEROL, DIRECT: Direct LDL: 126.5 mg/dL

## 2013-07-14 ENCOUNTER — Other Ambulatory Visit (INDEPENDENT_AMBULATORY_CARE_PROVIDER_SITE_OTHER): Payer: Medicare Other

## 2013-07-14 DIAGNOSIS — Z1211 Encounter for screening for malignant neoplasm of colon: Secondary | ICD-10-CM | POA: Diagnosis not present

## 2013-07-14 LAB — FECAL OCCULT BLOOD, IMMUNOCHEMICAL: Fecal Occult Bld: NEGATIVE

## 2013-07-15 ENCOUNTER — Ambulatory Visit: Payer: Self-pay | Admitting: Family Medicine

## 2013-07-15 DIAGNOSIS — Z1231 Encounter for screening mammogram for malignant neoplasm of breast: Secondary | ICD-10-CM | POA: Diagnosis not present

## 2013-07-19 ENCOUNTER — Ambulatory Visit (INDEPENDENT_AMBULATORY_CARE_PROVIDER_SITE_OTHER): Payer: Medicare Other | Admitting: Family Medicine

## 2013-07-19 DIAGNOSIS — I4891 Unspecified atrial fibrillation: Secondary | ICD-10-CM | POA: Diagnosis not present

## 2013-07-19 LAB — POCT INR: INR: 1.73

## 2013-07-20 ENCOUNTER — Other Ambulatory Visit: Payer: Self-pay | Admitting: Cardiovascular Disease

## 2013-07-20 NOTE — Telephone Encounter (Signed)
Please see note below, Thank You.

## 2013-07-20 NOTE — Telephone Encounter (Signed)
Please review and refill, Thank You. 

## 2013-08-02 ENCOUNTER — Encounter: Payer: Self-pay | Admitting: Family Medicine

## 2013-08-16 ENCOUNTER — Encounter: Payer: Self-pay | Admitting: Family Medicine

## 2013-08-16 DIAGNOSIS — I4891 Unspecified atrial fibrillation: Secondary | ICD-10-CM | POA: Diagnosis not present

## 2013-08-17 ENCOUNTER — Ambulatory Visit (INDEPENDENT_AMBULATORY_CARE_PROVIDER_SITE_OTHER): Payer: Medicare Other | Admitting: Family Medicine

## 2013-08-17 LAB — POCT INR: INR: 2.47

## 2013-08-19 DIAGNOSIS — H40009 Preglaucoma, unspecified, unspecified eye: Secondary | ICD-10-CM | POA: Diagnosis not present

## 2013-08-31 ENCOUNTER — Other Ambulatory Visit: Payer: Self-pay | Admitting: Family Medicine

## 2013-09-13 ENCOUNTER — Ambulatory Visit (INDEPENDENT_AMBULATORY_CARE_PROVIDER_SITE_OTHER): Payer: Medicare Other | Admitting: Family Medicine

## 2013-09-13 DIAGNOSIS — I4891 Unspecified atrial fibrillation: Secondary | ICD-10-CM | POA: Diagnosis not present

## 2013-09-15 ENCOUNTER — Other Ambulatory Visit: Payer: Self-pay | Admitting: Family Medicine

## 2013-09-22 DIAGNOSIS — H40009 Preglaucoma, unspecified, unspecified eye: Secondary | ICD-10-CM | POA: Diagnosis not present

## 2013-10-11 DIAGNOSIS — I4891 Unspecified atrial fibrillation: Secondary | ICD-10-CM | POA: Diagnosis not present

## 2013-10-11 LAB — POCT INR: INR: 2.31

## 2013-10-12 ENCOUNTER — Ambulatory Visit (INDEPENDENT_AMBULATORY_CARE_PROVIDER_SITE_OTHER): Payer: Medicare Other | Admitting: Family Medicine

## 2013-10-12 ENCOUNTER — Telehealth: Payer: Self-pay | Admitting: Family Medicine

## 2013-10-12 NOTE — Telephone Encounter (Signed)
Called patient back and let her know she can go ahead and mail those into [place].

## 2013-10-12 NOTE — Telephone Encounter (Signed)
Yes ok to drop off or mail in.  Does not need to be seen.

## 2013-10-12 NOTE — Telephone Encounter (Signed)
Pt is needing medical clearance for wellness program at [facility]. She was last seen 10/14 Please advise if she could just drop those off and go by her last visit. Pt was wanting to mail in form if we can go off of her last visit. She ask if we have to leave a message that we leave if she can mail the form in or not.

## 2013-10-25 ENCOUNTER — Encounter: Payer: Self-pay | Admitting: Cardiovascular Disease

## 2013-10-25 ENCOUNTER — Ambulatory Visit (INDEPENDENT_AMBULATORY_CARE_PROVIDER_SITE_OTHER): Payer: Medicare Other | Admitting: Cardiovascular Disease

## 2013-10-25 VITALS — BP 102/82 | HR 92 | Ht 69.0 in | Wt 132.2 lb

## 2013-10-25 DIAGNOSIS — I4891 Unspecified atrial fibrillation: Secondary | ICD-10-CM

## 2013-10-25 DIAGNOSIS — R2681 Unsteadiness on feet: Secondary | ICD-10-CM

## 2013-10-25 DIAGNOSIS — R269 Unspecified abnormalities of gait and mobility: Secondary | ICD-10-CM | POA: Diagnosis not present

## 2013-10-25 NOTE — Assessment & Plan Note (Signed)
Encouraged her to continue her exercise. She reports that weight is up 5 pounds.

## 2013-10-25 NOTE — Assessment & Plan Note (Signed)
Chronic atrial fibrillation, asymptomatic. Suggested she stay on her warfarin

## 2013-10-25 NOTE — Progress Notes (Signed)
Patient ID: [name], female    DOB: Nov 21, 1931, 80 y.o.   MRN: [id number]  HPI Comments: [name] is a 80 year old woman with chronic atrial fibrillation, COPD, 40 years of smoking, on warfarin and rate control medications who presents for routine followup. [facility].  On her last clinic visit, she was confused that she was in atrial fibrillation. She had been told by her prior cardiologist that "everything was fine". EKG on her last visit showed atrial fibrillation. EKG on today's visit shows atrial fibrillation with ventricular rate 92 beats per minute No recent falls. She denies any dizziness or symptoms concerning for orthostasis.   Reports that she is active, walks frequently for exercise and her balance. Otherwise feels well with no complaints  Prior cardiac notes from Dr. Sheppard Coil in [place] indicate she has chronic atrial fibrillation,  echocardiogram in 2010 and March 2014 confirming atrial fibrillation. No old EKGs are available.  Echocardiogram from March 2014 shows normal LV systolic function, severely dilated left and right atrium, mildly elevated right ventricular systolic pressure estimated at 35 mm of mercury   Outpatient Encounter Prescriptions as of 10/25/2013  Medication Sig  . Cholecalciferol (VITAMIN D-3) 1000 UNITS CAPS Take by mouth.  . digoxin (LANOXIN) 0.125 MG tablet Take 0.125 mg by mouth daily.  . diphenhydrAMINE (BENADRYL) 25 MG tablet Take 25 mg by mouth as needed for sleep.   Marland Kitchen Fluticasone-Salmeterol (ADVAIR DISKUS) 100-50 MCG/DOSE AEPB Inhale 1 puff into the lungs daily.  Marland Kitchen levothyroxine (SYNTHROID, LEVOTHROID) 50 MCG tablet TAKE ONE TABLET BY MOUTH ONE DAY, THEN HALF A TABLET BY MOUTH THE NEXT DAY AND CONTINUE TO ALTERNATE DOSES  . Melatonin 1 MG TABS Take by mouth.  . traZODone (DESYREL) 50 MG tablet TAKE 1 TABLET BY MOUTH EVERY EVENING AS NEEDED FOR INSOMNIA  . verapamil (CALAN-SR) 120 MG CR tablet Take 120 mg by mouth daily.  Marland Kitchen  warfarin (COUMADIN) 5 MG tablet Take as directed.  . [DISCONTINUED] warfarin (COUMADIN) 5 MG tablet TAKE 1-2 TABLETS BY MOUTH WITH SUPPER OR AS DIRECTED    Review of Systems  Constitutional: Negative.   HENT: Negative.   Eyes: Negative.   Respiratory: Negative.   Cardiovascular: Negative.   Gastrointestinal: Negative.   Endocrine: Negative.   Musculoskeletal: Positive for gait problem.  Skin: Negative.   Allergic/Immunologic: Negative.   Neurological: Negative.   Hematological: Negative.   Psychiatric/Behavioral: Negative.   All other systems reviewed and are negative.    BP 102/82  Pulse 92  Ht 5\' 9"  (1.753 m)  Wt 132 lb 4 oz (59.988 kg)  BMI 19.52 kg/m2  Physical Exam  Nursing note and vitals reviewed. Constitutional: She is oriented to person, place, and time. She appears well-developed and well-nourished.  HENT:  Head: Normocephalic.  Nose: Nose normal.  Mouth/Throat: Oropharynx is clear and moist.  Eyes: Conjunctivae are normal. Pupils are equal, round, and reactive to light.  Neck: Normal range of motion. Neck supple. No JVD present.  Cardiovascular: Normal rate, regular rhythm, S1 normal, S2 normal, normal heart sounds and intact distal pulses.  Exam reveals no gallop and no friction rub.   No murmur heard. Pulmonary/Chest: Effort normal and breath sounds normal. No respiratory distress. She has no wheezes. She has no rales. She exhibits no tenderness.  Abdominal: Soft. Bowel sounds are normal. She exhibits no distension. There is no tenderness.  Musculoskeletal: Normal range of motion. She exhibits no edema and no tenderness.  Lymphadenopathy:  She has no cervical adenopathy.  Neurological: She is alert and oriented to person, place, and time. Coordination normal.  Skin: Skin is warm and dry. No rash noted. No erythema.  Psychiatric: She has a normal mood and affect. Her behavior is normal. Judgment and thought content normal.    Assessment and Plan

## 2013-10-25 NOTE — Patient Instructions (Signed)
You are doing well. No medication changes were made. Call the office for any dizzy epsiodes  Please call us if you have new issues that need to be addressed before your next appt.  Your physician wants you to follow-up in: 6 months in June You will receive a reminder letter in the mail two months in advance. If you don't receive a letter, please call our office to schedule the follow-up appointment.

## 2013-11-08 ENCOUNTER — Ambulatory Visit (INDEPENDENT_AMBULATORY_CARE_PROVIDER_SITE_OTHER): Payer: Medicare Other | Admitting: Family Medicine

## 2013-11-08 DIAGNOSIS — I4891 Unspecified atrial fibrillation: Secondary | ICD-10-CM | POA: Diagnosis not present

## 2013-11-08 LAB — POCT INR: INR: 2.49

## 2013-11-25 DIAGNOSIS — D485 Neoplasm of uncertain behavior of skin: Secondary | ICD-10-CM | POA: Diagnosis not present

## 2013-11-25 DIAGNOSIS — Z85828 Personal history of other malignant neoplasm of skin: Secondary | ICD-10-CM | POA: Diagnosis not present

## 2013-11-30 ENCOUNTER — Other Ambulatory Visit: Payer: Self-pay | Admitting: Cardiovascular Disease

## 2013-12-06 ENCOUNTER — Telehealth: Payer: Self-pay

## 2013-12-06 NOTE — Telephone Encounter (Signed)
[name],  Please try to find out what is going on. A protime is the only lab we would ever get there in this manner

## 2013-12-06 NOTE — Telephone Encounter (Signed)
Katie Nurse at independent living [facility] left v/m wanting to know if there is an order for pt to get labs with [employer] [facility]. Pt is having PT/INR at [facility]. Unable to reach Katie by phone to verify what labs she is speaking of.Please advise.

## 2013-12-07 NOTE — Telephone Encounter (Signed)
Spoke with Scientist, research (physical sciences) at [facility].  Pt did not show up for her appt yesterday and there was a misunderstanding with [facility].  [name] said they figured it out and pt is rescheduled to have INR drawn on Thursday.

## 2013-12-08 DIAGNOSIS — L570 Actinic keratosis: Secondary | ICD-10-CM | POA: Diagnosis not present

## 2013-12-08 DIAGNOSIS — Z85828 Personal history of other malignant neoplasm of skin: Secondary | ICD-10-CM | POA: Diagnosis not present

## 2013-12-08 DIAGNOSIS — L723 Sebaceous cyst: Secondary | ICD-10-CM | POA: Diagnosis not present

## 2013-12-08 DIAGNOSIS — L821 Other seborrheic keratosis: Secondary | ICD-10-CM | POA: Diagnosis not present

## 2013-12-08 NOTE — Telephone Encounter (Signed)
Okay, thanks

## 2013-12-09 ENCOUNTER — Ambulatory Visit (INDEPENDENT_AMBULATORY_CARE_PROVIDER_SITE_OTHER): Payer: Medicare Other | Admitting: Family Medicine

## 2013-12-09 DIAGNOSIS — I4891 Unspecified atrial fibrillation: Secondary | ICD-10-CM | POA: Diagnosis not present

## 2013-12-09 LAB — POCT INR: INR: 2.15

## 2013-12-21 ENCOUNTER — Other Ambulatory Visit: Payer: Self-pay | Admitting: Family Medicine

## 2013-12-21 ENCOUNTER — Telehealth: Payer: Self-pay

## 2013-12-21 ENCOUNTER — Ambulatory Visit (INDEPENDENT_AMBULATORY_CARE_PROVIDER_SITE_OTHER): Payer: Medicare Other | Admitting: Family Medicine

## 2013-12-21 ENCOUNTER — Encounter: Payer: Self-pay | Admitting: Family Medicine

## 2013-12-21 VITALS — BP 106/72 | HR 122 | Temp 97.4°F | Ht 68.5 in | Wt 132.5 lb

## 2013-12-21 DIAGNOSIS — I4891 Unspecified atrial fibrillation: Secondary | ICD-10-CM

## 2013-12-21 DIAGNOSIS — E039 Hypothyroidism, unspecified: Secondary | ICD-10-CM | POA: Diagnosis not present

## 2013-12-21 DIAGNOSIS — J309 Allergic rhinitis, unspecified: Secondary | ICD-10-CM | POA: Insufficient documentation

## 2013-12-21 DIAGNOSIS — R269 Unspecified abnormalities of gait and mobility: Secondary | ICD-10-CM

## 2013-12-21 DIAGNOSIS — J449 Chronic obstructive pulmonary disease, unspecified: Secondary | ICD-10-CM

## 2013-12-21 DIAGNOSIS — R2681 Unsteadiness on feet: Secondary | ICD-10-CM

## 2013-12-21 DIAGNOSIS — H9191 Unspecified hearing loss, right ear: Secondary | ICD-10-CM | POA: Insufficient documentation

## 2013-12-21 DIAGNOSIS — H9190 Unspecified hearing loss, unspecified ear: Secondary | ICD-10-CM

## 2013-12-21 MED ORDER — WARFARIN SODIUM 5 MG PO TABS: ORAL_TABLET | ORAL | Status: DC

## 2013-12-21 MED ORDER — FLUTICASONE-SALMETEROL 100-50 MCG/DOSE IN AEPB
1.0000 | INHALATION_SPRAY | Freq: Every day | RESPIRATORY_TRACT | Status: DC
Start: 2013-12-21 — End: 2013-12-21

## 2013-12-21 MED ORDER — LEVOTHYROXINE SODIUM 50 MCG PO TABS: ORAL_TABLET | ORAL | Status: DC

## 2013-12-21 MED ORDER — FLUTICASONE-SALMETEROL 100-50 MCG/DOSE IN AEPB: 1.0000 | INHALATION_SPRAY | Freq: Two times a day (BID) | RESPIRATORY_TRACT | Status: DC

## 2013-12-21 NOTE — Telephone Encounter (Signed)
[facility] left v/m to verify instructions of 1 puff in lungs daily for Advair; [facility] said usual dose is 1 puff twice a day. Please advise. [facility] request cb.

## 2013-12-21 NOTE — Progress Notes (Signed)
Pre visit review using our clinic review tool, if applicable. No additional management support is needed unless otherwise documented below in the visit note. 

## 2013-12-21 NOTE — Telephone Encounter (Signed)
Yes typical dose is 1 puff twice daily.  Please change instructions on rx.

## 2013-12-21 NOTE — Telephone Encounter (Signed)
Lm with pharmacy for new instructions. Rx instruction changed and submitted

## 2013-12-21 NOTE — Progress Notes (Signed)
Subjective:    Patient ID: [name], female    DOB: 15-Jan-1932, 80 y.o.   MRN: [id number]  HPI  Very pleasant 80 yo female here for 6 month follow up with multiple concerns.  Chronic hearing loss in right ear- had an extensive work up in [place] by Nurse, [facility].  Would like to see an audiologist here.  Denies pain in right ear.  Actually sometimes has shooting pain in left ear.  History of SCC- had a lesion on left hand.  Had appointment with Dr. Beverely Low and was told lesion on left hand is a cyst and not SCC.  Feels her nose runs constantly- clear drainage.  No sinus pressure or tooth pain. Ears sometimes pop. Takes Benadryl at bedtime for insomnia.  Sleeps with windows open.  Rhinorrhea has blood streaks in am.  Chronic afib- was never symptomatic.  Diagnosed during pre op clearance for cataracts surgery in 2010.  Has been on digoxin and coumadin since.  Last saw Dr. Rockey Situ on 10/25/13- note reviewed.  Advised to continue coumadin. Lab Results  Component Value Date   INR 2.15 12/09/2013   INR 2.49 11/08/2013   INR 2.31 10/11/2013   Denies any CP, SOB or palpitations.    Also takes verapamil and digoxin.   Hypothyroidism- .  Has been on same dose of synthroid for "as long as she can remember." Denies any symptoms of hypo or hyperthyroidism. Lab Results  Component Value Date   TSH 2.78 06/17/2013   Lab Results  Component Value Date   CREATININE 0.8 06/17/2013     COPD- former smoker.  Has been on advair for years.  Typically not short of breath unless she really exerts herself.   Patient Active Problem List   Diagnosis Date Noted  . Gait instability 04/14/2013  . DNR (do not resuscitate) 04/13/2013  . Atrial fibrillation   . COPD (chronic obstructive pulmonary disease)   . Hypothyroidism    Past Medical History  Diagnosis Date  . Atrial fibrillation   . COPD (chronic obstructive pulmonary disease)   . Hypothyroidism   . Osteoporosis   . Syncope   . Squamous  cell carcinoma    Past Surgical History  Procedure Laterality Date  . Appendectomy    . Lipoma excision    . Cataract extraction Bilateral   . Colonoscopy    . Multiple tooth extractions     History  Substance Use Topics  . Smoking status: Former Smoker -- 1.00 packs/day for 40 years    Types: Cigarettes  . Smokeless tobacco: Not on file  . Alcohol Use: Yes     Comment: social   Family History  Problem Relation Age of Onset  . Alzheimer's disease Mother   . Cancer Brother 55    bladder  . Brain cancer Father    Allergies  Allergen Reactions  . Sulfa Antibiotics Nausea And Vomiting   Current Outpatient Prescriptions on File Prior to Visit  Medication Sig Dispense Refill  . Cholecalciferol (VITAMIN D-3) 1000 UNITS CAPS Take by mouth.      . digoxin (LANOXIN) 0.125 MG tablet Take 0.125 mg by mouth daily.      . diphenhydrAMINE (BENADRYL) 25 MG tablet Take 25 mg by mouth as needed for sleep.       . Melatonin 1 MG TABS Take by mouth.      . traZODone (DESYREL) 50 MG tablet TAKE 1 TABLET BY MOUTH EVERY EVENING AS NEEDED FOR INSOMNIA  90 tablet  0  . verapamil (CALAN-SR) 120 MG CR tablet TAKE 1 TABLET BY MOUTH ONCE DAILY  90 tablet  0   No current facility-administered medications on file prior to visit.   The PMH, PSH, Social History, Family History, Medications, and allergies have been reviewed in [facility], and have been updated if relevant.  Review of Systems See HPI     Objective:   Physical Exam BP 106/72  Pulse 122  Temp(Src) 97.4 F (36.3 C) (Oral)  Ht 5' 8.5" (1.74 m)  Wt 132 lb 8 oz (60.102 kg)  BMI 19.85 kg/m2  SpO2 97%  General:  Well-developed,well-nourished,in no acute distress; alert,appropriate and cooperative throughout examination Head:  normocephalic and atraumatic.   Eyes:  vision grossly intact, pupils equal, pupils round, and pupils reactive to light.   Ears:  R ear normal and L ear normal.   Nose:  no external deformity.   Mouth:  good  dentition.   Lungs:  Normal respiratory effort, chest expands symmetrically. Lungs are clear to auscultation, no crackles or wheezes. Heart:  Normal rate and regular rhythm. S1 and S2 normal without gallop, murmur, click, rub or other extra sounds. Abdomen:  Bowel sounds positive,abdomen soft and non-tender without masses, organomegaly or hernias noted. Msk:  No deformity or scoliosis noted of thoracic or lumbar spine.   Extremities:  No clubbing, cyanosis, edema, or deformity noted with normal full range of motion of all joints.   Neurologic:  alert & oriented X3 and gait normal.   Skin:  Intact without suspicious lesions or rashes Psych:  Cognition and judgment appear intact. Alert and cooperative with normal attention span and concentration. No apparent delusions, illusions, hallucinations     Assessment & Plan:

## 2013-12-21 NOTE — Patient Instructions (Addendum)
Good to see you. Start sleeping with the windows closed and with a cool mist humidifier on.  Continue taking Benadryl. You could try Allegra over the counter.  Please schedule an appointment to see me in December.  We will call you with your audiology referral.

## 2013-12-21 NOTE — Assessment & Plan Note (Signed)
Refer to audiology locally.

## 2013-12-21 NOTE — Assessment & Plan Note (Signed)
Advised closing windows at night and sleeping with humidifier. She could also try another antihistamine. Call or return to clinic prn if these symptoms worsen or fail to improve as anticipated. The patient indicates understanding of these issues and agrees with the plan.

## 2013-12-21 NOTE — Assessment & Plan Note (Signed)
Stable No changes 

## 2013-12-21 NOTE — Assessment & Plan Note (Signed)
Rate controlled. Continue current rx. On coumadin.

## 2014-01-06 ENCOUNTER — Telehealth: Payer: Self-pay | Admitting: Family Medicine

## 2014-01-06 ENCOUNTER — Ambulatory Visit (INDEPENDENT_AMBULATORY_CARE_PROVIDER_SITE_OTHER): Payer: Medicare Other | Admitting: General Practice

## 2014-01-06 DIAGNOSIS — I4891 Unspecified atrial fibrillation: Secondary | ICD-10-CM | POA: Diagnosis not present

## 2014-01-06 LAB — PROTIME-INR: INR: 2.5 — AB (ref <=1.1)

## 2014-01-06 NOTE — Telephone Encounter (Signed)
INR results from [facility] on 01/06/14.  PT result 26.6, INR result 2.53.

## 2014-01-06 NOTE — Progress Notes (Signed)
Pre visit review using our clinic review tool, if applicable. No additional management support is needed unless otherwise documented below in the visit note. 

## 2014-01-07 ENCOUNTER — Other Ambulatory Visit: Payer: Self-pay | Admitting: Internal Medicine

## 2014-01-07 NOTE — Telephone Encounter (Signed)
Last OV was 12/21/13--please advise

## 2014-01-20 DIAGNOSIS — H903 Sensorineural hearing loss, bilateral: Secondary | ICD-10-CM | POA: Diagnosis not present

## 2014-01-20 DIAGNOSIS — H905 Unspecified sensorineural hearing loss: Secondary | ICD-10-CM | POA: Diagnosis not present

## 2014-01-20 DIAGNOSIS — M26609 Unspecified temporomandibular joint disorder, unspecified side: Secondary | ICD-10-CM | POA: Diagnosis not present

## 2014-01-31 ENCOUNTER — Other Ambulatory Visit: Payer: Self-pay | Admitting: Cardiovascular Disease

## 2014-02-03 ENCOUNTER — Ambulatory Visit (INDEPENDENT_AMBULATORY_CARE_PROVIDER_SITE_OTHER): Payer: Medicare Other | Admitting: Family Medicine

## 2014-02-03 DIAGNOSIS — I4891 Unspecified atrial fibrillation: Secondary | ICD-10-CM | POA: Diagnosis not present

## 2014-02-03 LAB — POCT INR: INR: 2.07

## 2014-03-03 ENCOUNTER — Ambulatory Visit (INDEPENDENT_AMBULATORY_CARE_PROVIDER_SITE_OTHER): Payer: Medicare Other | Admitting: Family Medicine

## 2014-03-03 DIAGNOSIS — I4891 Unspecified atrial fibrillation: Secondary | ICD-10-CM | POA: Diagnosis not present

## 2014-03-03 LAB — POCT INR: INR: 2.62

## 2014-03-08 ENCOUNTER — Other Ambulatory Visit: Payer: Self-pay | Admitting: Cardiovascular Disease

## 2014-03-10 DIAGNOSIS — L570 Actinic keratosis: Secondary | ICD-10-CM | POA: Diagnosis not present

## 2014-03-10 DIAGNOSIS — L821 Other seborrheic keratosis: Secondary | ICD-10-CM | POA: Diagnosis not present

## 2014-03-20 ENCOUNTER — Other Ambulatory Visit: Payer: Self-pay | Admitting: Family Medicine

## 2014-03-22 DIAGNOSIS — H40009 Preglaucoma, unspecified, unspecified eye: Secondary | ICD-10-CM | POA: Diagnosis not present

## 2014-03-31 ENCOUNTER — Ambulatory Visit (INDEPENDENT_AMBULATORY_CARE_PROVIDER_SITE_OTHER): Payer: PRIVATE HEALTH INSURANCE | Admitting: Family Medicine

## 2014-03-31 DIAGNOSIS — I4891 Unspecified atrial fibrillation: Secondary | ICD-10-CM | POA: Diagnosis not present

## 2014-03-31 LAB — POCT INR: INR: 2.7

## 2014-04-04 ENCOUNTER — Ambulatory Visit (INDEPENDENT_AMBULATORY_CARE_PROVIDER_SITE_OTHER): Payer: Medicare Other | Admitting: Family Medicine

## 2014-04-04 VITALS — BP 110/68 | HR 82 | Temp 97.6°F | Wt 131.5 lb

## 2014-04-04 DIAGNOSIS — R197 Diarrhea, unspecified: Secondary | ICD-10-CM | POA: Diagnosis not present

## 2014-04-04 DIAGNOSIS — R634 Abnormal weight loss: Secondary | ICD-10-CM

## 2014-04-04 DIAGNOSIS — E039 Hypothyroidism, unspecified: Secondary | ICD-10-CM

## 2014-04-04 LAB — TSH: TSH: 1.89 u[IU]/mL (ref 0.35–4.50)

## 2014-04-04 LAB — T4, FREE: FREE T4: 1.04 ng/dL (ref 0.60–1.60)

## 2014-04-04 MED ORDER — ALIGN 4 MG PO CAPS: ORAL_CAPSULE | ORAL | Status: DC

## 2014-04-04 NOTE — Progress Notes (Signed)
Pre visit review using our clinic review tool, if applicable. No additional management support is needed unless otherwise documented below in the visit note. 

## 2014-04-04 NOTE — Assessment & Plan Note (Signed)
Abdominal exam reassuring- no indication of firm stool ball on exam. Will check digoxin level, thyroid function, and C diff to rule out other possible causes of loose stools. No night time awakenings or red flag symptoms. Add probiotic-Align. The patient indicates understanding of these issues and agrees with the plan.

## 2014-04-04 NOTE — Patient Instructions (Addendum)
Lets get some blood work today and a stool sample. It is ok to start an over probiotic like Align. I will call you with your results.

## 2014-04-04 NOTE — Progress Notes (Signed)
Subjective:    Patient ID: [name], female    DOB: 08-23-1932, 80 y.o.   MRN: [id number]  HPI  Very pleasant 80 yo female here with weight loss, diarrhea and anorexia x 2  months.  "unformed" bowel movements, 1-3 times per day.  Has had some night time bloating. No black or bloody stools. No mucous in stools. Last colonoscopy 06/2010, neg stool card last year on 07/14/13. No recent antibiotics.  Wt Readings from Last 3 Encounters:  04/04/14 131 lb 8 oz (59.648 kg)  12/21/13 132 lb 8 oz (60.102 kg)  10/25/13 132 lb 4 oz (59.988 kg)   Reports weight loss but weight stable according to our scales but did just eat breakfast and had her shoes on.  Chronic afib- was never symptomatic.    Has been on digoxin and coumadin since, no changes in dose.  Followed by Dr. Rockey Situ. Lab Results  Component Value Date   INR 2.70 03/31/2014   INR 2.62 03/03/2014   INR 2.07 02/03/2014   Denies any CP, SOB or palpitations.    Hypothyroidism- .  Has been on same dose of synthroid for "as long as she can remember." Denies any symptoms of hypo or hyperthyroidism. Lab Results  Component Value Date   TSH 2.78 06/17/2013   Lab Results  Component Value Date   CREATININE 0.8 06/17/2013     Patient Active Problem List   Diagnosis Date Noted  . Diarrhea 04/04/2014  . Loss of weight 04/04/2014  . Hearing loss in right ear 12/21/2013  . Allergic rhinitis 12/21/2013  . Gait instability 04/14/2013  . DNR (do not resuscitate) 04/13/2013  . Atrial fibrillation   . COPD (chronic obstructive pulmonary disease)   . Hypothyroidism    Past Medical History  Diagnosis Date  . Atrial fibrillation   . COPD (chronic obstructive pulmonary disease)   . Hypothyroidism   . Osteoporosis   . Syncope   . Squamous cell carcinoma    Past Surgical History  Procedure Laterality Date  . Appendectomy    . Lipoma excision    . Cataract extraction Bilateral   . Colonoscopy    . Multiple tooth extractions      History  Substance Use Topics  . Smoking status: Former Smoker -- 1.00 packs/day for 40 years    Types: Cigarettes  . Smokeless tobacco: Not on file  . Alcohol Use: Yes     Comment: social   Family History  Problem Relation Age of Onset  . Alzheimer's disease Mother   . Cancer Brother 55    bladder  . Brain cancer Father    Allergies  Allergen Reactions  . Sulfa Antibiotics Nausea And Vomiting   Current Outpatient Prescriptions on File Prior to Visit  Medication Sig Dispense Refill  . Cholecalciferol (VITAMIN D-3) 1000 UNITS CAPS Take by mouth.      Marland Kitchen DIGOX 125 MCG tablet TAKE 1 TABLET BY MOUTH EVERY DAY  90 tablet  3  . diphenhydrAMINE (BENADRYL) 25 MG tablet Take 25 mg by mouth as needed for sleep.       Marland Kitchen Fluticasone-Salmeterol (ADVAIR DISKUS) 100-50 MCG/DOSE AEPB Inhale 1 puff into the lungs 2 (two) times daily.  60 each  2  . levothyroxine (SYNTHROID, LEVOTHROID) 50 MCG tablet TAKE ONE TABLET BY MOUTH ONE DAY, THEN HALF A TABLET BY MOUTH THE NEXT DAY AND CONTINUE TO ALTERNATE DOSES  90 tablet  0  . Melatonin 1 MG TABS Take by mouth.      Marland Kitchen  traZODone (DESYREL) 50 MG tablet TAKE 1 TABLET BY MOUTH EVERY EVENING AS NEEDED FOR INSOMNIA  90 tablet  0  . verapamil (CALAN-SR) 120 MG CR tablet TAKE 1 TABLET BY MOUTH EVERY DAY  90 tablet  3  . warfarin (COUMADIN) 5 MG tablet TAKE AS DIRECTED  90 tablet  0   No current facility-administered medications on file prior to visit.   The PMH, PSH, Social History, Family History, Medications, and allergies have been reviewed in [facility], and have been updated if relevant.  Review of Systems See HPI  No nausea or vomiting No fevers No incontinence No early satiety     Objective:   Physical Exam BP 110/68  Pulse 82  Temp(Src) 97.6 F (36.4 C) (Oral)  Wt 131 lb 8 oz (59.648 kg)  SpO2 98%  General:  Well-developed,well-nourished,in no acute distress; alert,appropriate and cooperative throughout examination Head:  normocephalic and  atraumatic.   Abdomen:  Bowel sounds positive,abdomen soft and non-tender without masses, organomegaly or hernias noted. Skin:  Intact without suspicious lesions or rashes Psych:  Cognition and judgment appear intact. Alert and cooperative with normal attention span and concentration. No apparent delusions, illusions, hallucinations     Assessment & Plan:

## 2014-04-05 ENCOUNTER — Telehealth: Payer: Self-pay

## 2014-04-05 LAB — DIGOXIN LEVEL: Digoxin Level: 0.71 ng/mL — ABNORMAL LOW (ref 0.8–2.0)

## 2014-04-05 NOTE — Telephone Encounter (Signed)
Pt had lab draw in rt arm on 04/04/14; pt had immediate pain in rt thumb at time of draw; pt advised then may have hit nerve and would resolve. Pt said thumb pain has resolved but on 04/04/14 there was swelling and bruising at site where blood was drawn. Pt put ice on arm x 2 yesterday and arm is not as swollen today with no pain. Pt has no SOB or CP. Pt is concerned due to pt being on coumadin. Spoke with Dr Deborra Medina and she said with info given to her she thought would be OK; Benjie Karvonen RN team lead looked at rt arm and advised pt to alternate heat and ice for no more than 20 mins every 2 hours prn. If condition changes or worsens pt will contact office or go to UC if needed. Pt voiced understanding.

## 2014-04-06 NOTE — Addendum Note (Signed)
Addended by: Ellamae Sia on: 04/06/2014 02:23 PM   Modules accepted: Orders

## 2014-04-07 ENCOUNTER — Telehealth: Payer: Self-pay

## 2014-04-07 DIAGNOSIS — R197 Diarrhea, unspecified: Secondary | ICD-10-CM | POA: Diagnosis not present

## 2014-04-07 NOTE — Addendum Note (Signed)
Addended by: Ellamae Sia on: 04/07/2014 10:44 AM   Modules accepted: Orders

## 2014-04-07 NOTE — Telephone Encounter (Signed)
Pt left note requesting to add Dr Darnelle Spangle and Dr Dingledien to her chart as physicians that pt sees periodically. Added to snapshot as requested. Pt wanted Dr Bronson Curb DDS added but could not find name in directory and could not add Dr Bronson Curb. Pt notifed done.

## 2014-04-08 LAB — C. DIFFICILE GDH AND TOXIN A/B
C. DIFF TOXIN A/B: NOT DETECTED
C. difficile GDH: NOT DETECTED

## 2014-04-22 ENCOUNTER — Other Ambulatory Visit: Payer: Self-pay | Admitting: Family Medicine

## 2014-04-25 ENCOUNTER — Encounter: Payer: Self-pay | Admitting: Cardiovascular Disease

## 2014-04-25 ENCOUNTER — Ambulatory Visit (INDEPENDENT_AMBULATORY_CARE_PROVIDER_SITE_OTHER): Payer: Medicare Other | Admitting: Cardiovascular Disease

## 2014-04-25 VITALS — BP 100/80 | HR 78 | Ht 69.0 in | Wt 132.0 lb

## 2014-04-25 DIAGNOSIS — I4891 Unspecified atrial fibrillation: Secondary | ICD-10-CM

## 2014-04-25 DIAGNOSIS — R634 Abnormal weight loss: Secondary | ICD-10-CM

## 2014-04-25 NOTE — Progress Notes (Signed)
Patient ID: [name], female    DOB: [birth year], 80 y.o.   MRN: [id number]  HPI Comments: [name] is a 81 year old woman with chronic atrial fibrillation, COPD, 40 years of smoking, on warfarin and rate control medications who presents for routine followup. She lives at [facility] [address].  On prior clinic visits, she was confused that she was in atrial fibrillation. She had been told by her prior cardiologist that "everything was fine". Since she has been seen in our clinic, she has been in permanent atrial fibrillation  In followup today, she reports that she is doing well. No orthostasis, no shortness of breath. She is very active, does exercise classes. Recently had low digoxin level 0.71. Heart rate otherwise well controlled No recent falls.   no complaints  Prior cardiac notes from Dr. Sheppard Coil in [place] indicate she has chronic atrial fibrillation,  echocardiogram in 2010 and March 2014 confirming atrial fibrillation. No old EKGs are available.  Echocardiogram from March 2014 shows normal LV systolic function, severely dilated left and right atrium, mildly elevated right ventricular systolic pressure estimated at 35 mm of mercury  EKG on today's visit shows atrial fibrillation with rate 78 beats per minute, no significant ST or T wave changes   Outpatient Encounter Prescriptions as of 04/25/2014  Medication Sig  . Cholecalciferol (VITAMIN D-3) 1000 UNITS CAPS Take by mouth.  Marland Kitchen DIGOX 125 MCG tablet TAKE 1 TABLET BY MOUTH EVERY DAY  . diphenhydrAMINE (BENADRYL) 25 MG tablet Take 25 mg by mouth as needed for sleep.   Marland Kitchen Fluticasone-Salmeterol (ADVAIR DISKUS) 100-50 MCG/DOSE AEPB Inhale 1 puff into the lungs 2 (two) times daily.  Marland Kitchen levothyroxine (SYNTHROID, LEVOTHROID) 50 MCG tablet TAKE 1 TABLET BY MOUTH ONE DAY, THEN TAKE 1/2 TABLET BY MOUTH THE NEXT AND CONTINUE TO ALTERNATE DOSES  . Melatonin 1 MG TABS Take by mouth.  . Probiotic Product (ALIGN) 4 MG CAPS 1 tablet daily  .  traZODone (DESYREL) 50 MG tablet TAKE 1 TABLET BY MOUTH EVERY EVENING AS NEEDED FOR INSOMNIA  . verapamil (CALAN-SR) 120 MG CR tablet TAKE 1 TABLET BY MOUTH EVERY DAY  . warfarin (COUMADIN) 5 MG tablet TAKE AS DIRECTED    Review of Systems  Constitutional: Negative.   HENT: Negative.   Eyes: Negative.   Respiratory: Negative.   Cardiovascular: Negative.   Gastrointestinal: Negative.   Endocrine: Negative.   Musculoskeletal: Positive for gait problem.  Skin: Negative.   Allergic/Immunologic: Negative.   Neurological: Negative.   Hematological: Negative.   Psychiatric/Behavioral: Negative.   All other systems reviewed and are negative.   BP 100/80  Pulse 78  Ht 5\' 9"  (0.947 m)  Wt 132 lb (59.875 kg)  BMI 19.48 kg/m2  Physical Exam  Nursing note and vitals reviewed. Constitutional: She is oriented to person, place, and time. She appears well-developed and well-nourished.  HENT:  Head: Normocephalic.  Nose: Nose normal.  Mouth/Throat: Oropharynx is clear and moist.  Eyes: Conjunctivae are normal. Pupils are equal, round, and reactive to light.  Neck: Normal range of motion. Neck supple. No JVD present.  Cardiovascular: Normal rate, S1 normal, S2 normal, normal heart sounds and intact distal pulses.  An irregularly irregular rhythm present. Exam reveals no gallop and no friction rub.   No murmur heard. Pulmonary/Chest: Effort normal and breath sounds normal. No respiratory distress. She has no wheezes. She has no rales. She exhibits no tenderness.  Abdominal: Soft. Bowel sounds are normal. She exhibits no distension. There is no  tenderness.  Musculoskeletal: Normal range of motion. She exhibits no edema and no tenderness.  Lymphadenopathy:    She has no cervical adenopathy.  Neurological: She is alert and oriented to person, place, and time. Coordination normal.  Skin: Skin is warm and dry. No rash noted. No erythema.  Psychiatric: She has a normal mood and affect. Her  behavior is normal. Judgment and thought content normal.    Assessment and Plan

## 2014-04-25 NOTE — Patient Instructions (Signed)
You are doing well. No medication changes were made.  Please call us if you have new issues that need to be addressed before your next appt.  Your physician wants you to follow-up in: 12 months.  You will receive a reminder letter in the mail two months in advance. If you don't receive a letter, please call our office to schedule the follow-up appointment. 

## 2014-04-25 NOTE — Assessment & Plan Note (Signed)
Heart rate well-controlled, tolerating anticoagulation. No further workup needed at this time

## 2014-04-25 NOTE — Assessment & Plan Note (Signed)
Blood pressure is getting low. Recommended that she try not to lose any weight and to call our office if she has any orthostasis symptoms.

## 2014-04-28 DIAGNOSIS — Z7901 Long term (current) use of anticoagulants: Secondary | ICD-10-CM | POA: Diagnosis not present

## 2014-04-28 LAB — PROTIME-INR

## 2014-04-29 ENCOUNTER — Encounter: Payer: Self-pay | Admitting: Family Medicine

## 2014-05-02 ENCOUNTER — Ambulatory Visit (INDEPENDENT_AMBULATORY_CARE_PROVIDER_SITE_OTHER): Payer: Medicare Other | Admitting: Family Medicine

## 2014-05-02 LAB — POCT INR: INR: 2.42

## 2014-05-10 ENCOUNTER — Other Ambulatory Visit: Payer: Self-pay | Admitting: Family Medicine

## 2014-05-16 ENCOUNTER — Other Ambulatory Visit: Payer: Self-pay | Admitting: Family Medicine

## 2014-05-19 ENCOUNTER — Telehealth: Payer: Self-pay | Admitting: *Deleted

## 2014-05-19 NOTE — Telephone Encounter (Signed)
Pt dropped off handicap placard request form to be completed; completed and mailed to pt at her request

## 2014-05-26 ENCOUNTER — Ambulatory Visit (INDEPENDENT_AMBULATORY_CARE_PROVIDER_SITE_OTHER): Payer: Medicare Other | Admitting: Family Medicine

## 2014-05-26 DIAGNOSIS — I4891 Unspecified atrial fibrillation: Secondary | ICD-10-CM | POA: Diagnosis not present

## 2014-05-26 LAB — POCT INR: INR: 2.09

## 2014-06-04 ENCOUNTER — Other Ambulatory Visit: Payer: Self-pay | Admitting: Family Medicine

## 2014-06-17 DIAGNOSIS — Z23 Encounter for immunization: Secondary | ICD-10-CM | POA: Diagnosis not present

## 2014-06-23 ENCOUNTER — Encounter: Payer: Self-pay | Admitting: Family Medicine

## 2014-06-23 DIAGNOSIS — I4891 Unspecified atrial fibrillation: Secondary | ICD-10-CM | POA: Diagnosis not present

## 2014-06-23 LAB — PROTIME-INR

## 2014-07-21 DIAGNOSIS — I4891 Unspecified atrial fibrillation: Secondary | ICD-10-CM | POA: Diagnosis not present

## 2014-07-21 LAB — PROTIME-INR

## 2014-07-22 ENCOUNTER — Encounter: Payer: Self-pay | Admitting: Family Medicine

## 2014-08-07 ENCOUNTER — Other Ambulatory Visit: Payer: Self-pay | Admitting: Family Medicine

## 2014-08-09 ENCOUNTER — Other Ambulatory Visit: Payer: Self-pay | Admitting: Family Medicine

## 2014-08-09 NOTE — Telephone Encounter (Signed)
Pt request status of advair refill; advised done this afternoon to [facility] [address]. Pt will ck with pharmacy.

## 2014-08-18 DIAGNOSIS — I482 Chronic atrial fibrillation: Secondary | ICD-10-CM | POA: Diagnosis not present

## 2014-09-05 ENCOUNTER — Other Ambulatory Visit: Payer: Self-pay | Admitting: Family Medicine

## 2014-09-19 ENCOUNTER — Encounter: Payer: Self-pay | Admitting: Family Medicine

## 2014-09-19 ENCOUNTER — Ambulatory Visit (INDEPENDENT_AMBULATORY_CARE_PROVIDER_SITE_OTHER): Payer: Medicare Other | Admitting: Family Medicine

## 2014-09-19 VITALS — BP 110/76 | HR 77 | Temp 97.4°F | Wt 129.8 lb

## 2014-09-19 DIAGNOSIS — Z13220 Encounter for screening for lipoid disorders: Secondary | ICD-10-CM

## 2014-09-19 DIAGNOSIS — I10 Essential (primary) hypertension: Secondary | ICD-10-CM | POA: Diagnosis not present

## 2014-09-19 DIAGNOSIS — R197 Diarrhea, unspecified: Secondary | ICD-10-CM | POA: Diagnosis not present

## 2014-09-19 DIAGNOSIS — R634 Abnormal weight loss: Secondary | ICD-10-CM

## 2014-09-19 DIAGNOSIS — Z1211 Encounter for screening for malignant neoplasm of colon: Secondary | ICD-10-CM | POA: Diagnosis not present

## 2014-09-19 DIAGNOSIS — E785 Hyperlipidemia, unspecified: Secondary | ICD-10-CM | POA: Diagnosis not present

## 2014-09-19 DIAGNOSIS — Z79899 Other long term (current) drug therapy: Secondary | ICD-10-CM | POA: Diagnosis not present

## 2014-09-19 DIAGNOSIS — I4891 Unspecified atrial fibrillation: Secondary | ICD-10-CM | POA: Diagnosis not present

## 2014-09-19 DIAGNOSIS — I482 Chronic atrial fibrillation: Secondary | ICD-10-CM | POA: Diagnosis not present

## 2014-09-19 LAB — COMPREHENSIVE METABOLIC PANEL
ALT: 31 U/L (ref 0–35)
AST: 43 U/L — ABNORMAL HIGH (ref 0–37)
Albumin: 4.6 g/dL (ref 3.5–5.2)
Alkaline Phosphatase: 67 U/L (ref 39–117)
BILIRUBIN TOTAL: 0.8 mg/dL (ref 0.2–1.2)
BUN: 11 mg/dL (ref 6–23)
CHLORIDE: 104 meq/L (ref 96–112)
CO2: 27 mEq/L (ref 19–32)
CREATININE: 0.7 mg/dL (ref 0.4–1.2)
Calcium: 10.2 mg/dL (ref 8.4–10.5)
GFR: 88.32 mL/min (ref >60.00)
Glucose, Bld: 69 mg/dL — ABNORMAL LOW (ref 70–99)
Potassium: 4.6 mEq/L (ref 3.5–5.1)
Sodium: 140 mEq/L (ref 135–145)
TOTAL PROTEIN: 7.8 g/dL (ref 6.0–8.3)

## 2014-09-19 LAB — CBC WITH DIFFERENTIAL/PLATELET
Basophils Absolute: 0.1 K/uL (ref 0.0–0.1)
Basophils Relative: 0.8 % (ref 0.0–3.0)
Eosinophils Absolute: 0.1 K/uL (ref 0.0–0.7)
Eosinophils Relative: 1.3 % (ref 0.0–5.0)
HCT: 51.1 % — ABNORMAL HIGH (ref 36.0–46.0)
Hemoglobin: 16.5 g/dL — ABNORMAL HIGH (ref 12.0–15.0)
Lymphocytes Relative: 24.7 % (ref 12.0–46.0)
Lymphs Abs: 1.7 K/uL (ref 0.7–4.0)
MCHC: 32.2 g/dL (ref 30.0–36.0)
MCV: 93.7 fl (ref 78.0–100.0)
Monocytes Absolute: 0.4 K/uL (ref 0.1–1.0)
Monocytes Relative: 6.3 % (ref 3.0–12.0)
Neutro Abs: 4.6 K/uL (ref 1.4–7.7)
Neutrophils Relative %: 66.9 % (ref 43.0–77.0)
Platelets: 171 K/uL (ref 150.0–400.0)
RBC: 5.46 Mil/uL — ABNORMAL HIGH (ref 3.87–5.11)
RDW: 14.1 % (ref 11.5–15.5)
WBC: 6.9 K/uL (ref 4.0–10.5)

## 2014-09-19 LAB — TSH: TSH: 3.17 u[IU]/mL (ref 0.35–4.50)

## 2014-09-19 LAB — LIPID PANEL
Cholesterol: 272 mg/dL — ABNORMAL HIGH (ref 0–200)
HDL: 79.7 mg/dL
LDL Cholesterol: 165 mg/dL — ABNORMAL HIGH (ref 0–99)
NonHDL: 192.3
Total CHOL/HDL Ratio: 3
Triglycerides: 139 mg/dL (ref 0.0–149.0)
VLDL: 27.8 mg/dL (ref 0.0–40.0)

## 2014-09-19 LAB — HEMOGLOBIN A1C: Hgb A1c MFr Bld: 6 % (ref 4.6–6.5)

## 2014-09-19 MED ORDER — WARFARIN SODIUM 5 MG PO TABS: ORAL_TABLET | ORAL | Status: DC

## 2014-09-19 MED ORDER — TRAZODONE HCL 50 MG PO TABS: ORAL_TABLET | ORAL | Status: DC

## 2014-09-19 NOTE — Assessment & Plan Note (Signed)
Persistent and concerning. Check labs today. See above.

## 2014-09-19 NOTE — Progress Notes (Signed)
Pre visit review using our clinic review tool, if applicable. No additional management support is needed unless otherwise documented below in the visit note. 

## 2014-09-19 NOTE — Assessment & Plan Note (Signed)
Intermittent issue- with unintentional weight loss which is concerning. She is refusing GI referral. Will check IFOB today. Long time spent with pt today- >25 minutes spent in face to face time with patient, >50% spent in counselling or coordination of care, explaining that she should come in for a wellness visit and we do not call pts to remind them to schedule these unfortunately.

## 2014-09-19 NOTE — Addendum Note (Signed)
Addended by: Ellamae Sia on: 09/19/2014 12:21 PM   Modules accepted: Orders

## 2014-09-19 NOTE — Progress Notes (Signed)
Subjective:    Patient ID: [name], female    DOB: [birth year], 81 y.o.   MRN: [id number]  HPI  Very pleasant 81 yo female here for 6 month follow up. Has multiple issues she wants to discuss today. She is frustrated that we did not call her to remind her to schedule a annual wellness visit- she thought today's visit was a wellness visit.    Hypothyroidism- .  Has been on same dose of synthroid for "as long as she can remember." Denies any symptoms of hypo or hyperthyroidism. Lab Results  Component Value Date   TSH 1.89 04/04/2014   Lab Results  Component Value Date   CREATININE 0.8 06/17/2013    Diarrhea- did improve when we started align for this in 03/2014.  Stopped taking probiotic once symptoms resolved.  Had another bout of diarrhea which started last month.  More bloating.  No blood in her stool.  Restarted probiotic and past two days, has had less bloating and more formed stools again. In July, ruled out dig toxicity along with c diff. No blood in her stool. Has had unintentional weight loss.  Craves sweets all the time. Wt Readings from Last 3 Encounters:  09/19/14 129 lb 12 oz (58.854 kg)  04/25/14 132 lb (59.875 kg)  04/04/14 131 lb 8 oz (59.648 kg)   Worried that her feet are deformed.  Patient Active Problem List   Diagnosis Date Noted  . Diarrhea 04/04/2014  . Loss of weight 04/04/2014  . Hearing loss in right ear 12/21/2013  . Allergic rhinitis 12/21/2013  . Gait instability 04/14/2013  . DNR (do not resuscitate) 04/13/2013  . Atrial fibrillation   . COPD (chronic obstructive pulmonary disease)   . Hypothyroidism    Past Medical History  Diagnosis Date  . Atrial fibrillation   . COPD (chronic obstructive pulmonary disease)   . Hypothyroidism   . Osteoporosis   . Syncope   . Squamous cell carcinoma    Past Surgical History  Procedure Laterality Date  . Appendectomy    . Lipoma excision    . Cataract extraction Bilateral   . Colonoscopy     . Multiple tooth extractions     History  Substance Use Topics  . Smoking status: Former Smoker -- 1.00 packs/day for 40 years    Types: Cigarettes  . Smokeless tobacco: Not on file  . Alcohol Use: Yes     Comment: social   Family History  Problem Relation Age of Onset  . Alzheimer's disease Mother   . Cancer Brother 55    bladder  . Brain cancer Father    Allergies  Allergen Reactions  . Sulfa Antibiotics Nausea And Vomiting   Current Outpatient Prescriptions on File Prior to Visit  Medication Sig Dispense Refill  . ADVAIR DISKUS 100-50 MCG/DOSE AEPB INHALE 1 PUFF BY MOUTH TWICE DAILY 60 each 1  . Cholecalciferol (VITAMIN D-3) 1000 UNITS CAPS Take by mouth.    Marland Kitchen DIGOX 125 MCG tablet TAKE 1 TABLET BY MOUTH EVERY DAY 90 tablet 3  . diphenhydrAMINE (BENADRYL) 25 MG tablet Take 25 mg by mouth as needed for sleep.     Marland Kitchen levothyroxine (SYNTHROID, LEVOTHROID) 50 MCG tablet TAKE 1 TABLET BY MOUTH ONE DAY, THEN TAKE 1/2 TABLET BY MOUTH THE NEXT AND CONTINUE TO ALTERNATE DOSES 90 tablet 1  . Melatonin 1 MG TABS Take by mouth.    . Probiotic Product (ALIGN) 4 MG CAPS 1 tablet daily 30 capsule 0  .  traZODone (DESYREL) 50 MG tablet TAKE 1 TABLET BY MOUTH EVERY EVENING AS NEEDED FOR INSOMNIA 30 tablet 0  . verapamil (CALAN-SR) 120 MG CR tablet TAKE 1 TABLET BY MOUTH EVERY DAY 90 tablet 3  . warfarin (COUMADIN) 5 MG tablet TAKE AS DIRECTED 90 tablet 0   No current facility-administered medications on file prior to visit.   The PMH, PSH, Social History, Family History, Medications, and allergies have been reviewed in [facility], and have been updated if relevant.  Review of Systems See HPI +untentional weight loss +abdominal bloating No nausea or vomiting +intermittent diarrhea No CP  No SOB No LE edema- wears compression hose +intermittent neuropathy Denies feeling anxious but "I am mad today" Denies any epistaxis    Objective:   Physical Exam BP 110/76 mmHg  Pulse 77  Temp(Src)  97.4 F (36.3 C) (Oral)  Wt 129 lb 12 oz (58.854 kg)  SpO2 96%  Wt Readings from Last 3 Encounters:  09/19/14 129 lb 12 oz (58.854 kg)  04/25/14 132 lb (59.875 kg)  04/04/14 131 lb 8 oz (59.648 kg)     General:  Well-developed,well-nourished,in no acute distress; alert,appropriate and cooperative throughout examination Head:  normocephalic and atraumatic.   Eyes:  vision grossly intact, pupils equal, pupils round, and pupils reactive to light.   Ears:  R ear normal and L ear normal.   Nose:  no external deformity.   Mouth:  good dentition.   Lungs:  Normal respiratory effort Heart:  Normal rate and effort Abdomen:  Bowel sounds positive,abdomen soft and non-tender without masses, organomegaly or hernias noted. Msk:  No deformity or scoliosis noted of thoracic or lumbar spine.   Extremities:  No LE edema, does have hyperpigmentation- changes of venous insufficiency Neurologic:  alert & oriented X3 and gait normal.   Skin:  Intact without suspicious lesions or rashes Psych:  Cognition and judgment appear intact. Alert and cooperative with normal attention span and concentration. No apparent delusions, illusions, hallucinations     Assessment & Plan:

## 2014-09-19 NOTE — Patient Instructions (Addendum)
Great to see you. Please make an appointment for a wellness visit on your way out- please schedule this week.

## 2014-09-20 ENCOUNTER — Ambulatory Visit (INDEPENDENT_AMBULATORY_CARE_PROVIDER_SITE_OTHER): Payer: Medicare Other | Admitting: Family Medicine

## 2014-09-20 LAB — POCT INR: INR: 2.43

## 2014-09-21 ENCOUNTER — Other Ambulatory Visit (INDEPENDENT_AMBULATORY_CARE_PROVIDER_SITE_OTHER): Payer: Medicare Other

## 2014-09-21 DIAGNOSIS — L720 Epidermal cyst: Secondary | ICD-10-CM | POA: Diagnosis not present

## 2014-09-21 DIAGNOSIS — X32XXXA Exposure to sunlight, initial encounter: Secondary | ICD-10-CM | POA: Diagnosis not present

## 2014-09-21 DIAGNOSIS — Z1211 Encounter for screening for malignant neoplasm of colon: Secondary | ICD-10-CM

## 2014-09-21 DIAGNOSIS — L821 Other seborrheic keratosis: Secondary | ICD-10-CM | POA: Diagnosis not present

## 2014-09-21 DIAGNOSIS — L570 Actinic keratosis: Secondary | ICD-10-CM | POA: Diagnosis not present

## 2014-09-21 LAB — FECAL OCCULT BLOOD, IMMUNOCHEMICAL: FECAL OCCULT BLD: POSITIVE — AB

## 2014-09-22 ENCOUNTER — Ambulatory Visit (INDEPENDENT_AMBULATORY_CARE_PROVIDER_SITE_OTHER): Payer: Medicare Other | Admitting: Family Medicine

## 2014-09-22 ENCOUNTER — Encounter: Payer: Self-pay | Admitting: Family Medicine

## 2014-09-22 VITALS — BP 112/60 | HR 175 | Temp 97.3°F | Ht 69.0 in | Wt 128.5 lb

## 2014-09-22 DIAGNOSIS — E785 Hyperlipidemia, unspecified: Secondary | ICD-10-CM | POA: Diagnosis not present

## 2014-09-22 DIAGNOSIS — R634 Abnormal weight loss: Secondary | ICD-10-CM

## 2014-09-22 DIAGNOSIS — Z66 Do not resuscitate: Secondary | ICD-10-CM | POA: Diagnosis not present

## 2014-09-22 DIAGNOSIS — R195 Other fecal abnormalities: Secondary | ICD-10-CM | POA: Diagnosis not present

## 2014-09-22 DIAGNOSIS — I4891 Unspecified atrial fibrillation: Secondary | ICD-10-CM

## 2014-09-22 DIAGNOSIS — R5381 Other malaise: Secondary | ICD-10-CM | POA: Diagnosis not present

## 2014-09-22 DIAGNOSIS — R000 Tachycardia, unspecified: Secondary | ICD-10-CM

## 2014-09-22 DIAGNOSIS — R197 Diarrhea, unspecified: Secondary | ICD-10-CM

## 2014-09-22 DIAGNOSIS — E782 Mixed hyperlipidemia: Secondary | ICD-10-CM | POA: Insufficient documentation

## 2014-09-22 DIAGNOSIS — Z0000 Encounter for general adult medical examination without abnormal findings: Secondary | ICD-10-CM | POA: Diagnosis not present

## 2014-09-22 NOTE — Assessment & Plan Note (Signed)
See above. Concerning issue- pursue GI work up and re evaluation by cardiology. Probably needs to get her digoxin level rechecked again. The patient indicates understanding of these issues and agrees with the plan.

## 2014-09-22 NOTE — Assessment & Plan Note (Signed)
The patients weight, height, BMI and visual acuity have been recorded in the chart I have made referrals, counseling and provided education to the patient based review of the above and I have provided the pt with a written personalized care plan for preventive services.  Discussed prevnar 13- she would like to return to have this done.  Discussed DEXA- she is declining which I feel is appropriate since she would not want bisphosphonate.  Also discussed ways in which she can increase dietary calcium- see AVS.

## 2014-09-22 NOTE — Assessment & Plan Note (Signed)
Intermittent, with unintentional weight loss and now heme positive IFOB. Refer to GI, will get a repeat IFOB in 2 weeks. The patient indicates understanding of these issues and agrees with the plan.

## 2014-09-22 NOTE — Assessment & Plan Note (Signed)
Deteriorated. She does not want to start rx at this time given that she knows her diet has worsened. Given handout on low cholesterol diet. Recheck lipid panel in 1 month. The patient indicates understanding of these issues and agrees with the plan.

## 2014-09-22 NOTE — Patient Instructions (Signed)
Good to see you. Please stop by to see [name] on your way out- I would like for her to schedule an appointment for you to see Dr. Rockey Situ sooner.  Also we will go ahead and make an appointment to see a gastroenterologist.  Calcium supplements have received some bad press lately, with questions that they may increase risk of heart attack or blood clots.  The risk is very low, however none of these risks occur with calcium in FOOD. Try to get most or all of your calcium from your food--aim for 1000 mg/day for women up to 54 and men up to 70 and 1200 mg/day for women over 31 and men over 70.  To figure out dietary calcium: 300 mg/day from all non dairy foods plus 300 mg per cup of milk, other dairy, or fortified juice.  Non dairy foods that contain calcium:  Kale, oranges, sardines, oatmeal, soy milk/soybeans, salmon, white beans, dried figs, turnip greens, almonds, broccoli, tofu.   Please work on your improving your cholesterol with the suggestions outlined in handout.

## 2014-09-22 NOTE — Assessment & Plan Note (Signed)
Deteriorated with intermittent RVR.   EKG not good reading- shows improved rate but I do not feel this is accurate.  Will ask Dr. Rockey Situ to see her urgently- ? If this was exacerbated due to her other recent health concerns and is likely worsening her feeling of malaise. Continue warfarin, verapamil and dig.

## 2014-09-22 NOTE — Progress Notes (Signed)
Pre visit review using our clinic review tool, if applicable. No additional management support is needed unless otherwise documented below in the visit note. 

## 2014-09-22 NOTE — Progress Notes (Signed)
Subjective:    Patient ID: [name], female    DOB: [birth year], 81 y.o.   MRN: [id number]  HPI  Very pleasant 81 yo female here for annual medicare wellness visit but she has multiple ongoing issues.  I have personally reviewed the Medicare Annual Wellness questionnaire and have noted 1. The patient's medical and social history 2. Their use of alcohol, tobacco or illicit drugs 3. Their current medications and supplements 4. The patient's functional ability including ADL's, fall risks, home safety risks and hearing or visual             impairment. 5. Diet and physical activities 6. Evidence for depression or mood disorders  End of life wishes discussed and updated in Social History.  The roster of all physicians providing medical care to patient - is listed in the Snapshot section of the chart.  Eye exam 03/22/14- next app 09/27/14 Dental exam 04/18/14- next appt on 10/24/14 Mammogram 07/15/13 Zostavax 08/06/07 DEXA 2013 Colonoscopy 07/11/10 Flu vaccine 06/17/14   I saw her earlier this week for loss of weight, intermittent diarrhea,. She has been refusing GI referral- did agree to IFOB which was positive.  Had another episode of diarrhea yesterday, normal/formed BM today. Wt Readings from Last 3 Encounters:  09/22/14 128 lb 8 oz (58.287 kg)  09/19/14 129 lb 12 oz (58.854 kg)  04/25/14 132 lb (59.875 kg)    HLD- very elevated this month compared to 06/2013.  She admits to eating more fatty and sweet foods but she is surprised by how high it is. Lab Results  Component Value Date   CHOL 272* 09/19/2014   HDL 79.70 09/19/2014   LDLCALC 165* 09/19/2014   LDLDIRECT 126.5 07/08/2013   TRIG 139.0 09/19/2014   CHOLHDL 3 09/19/2014    Lab Results  Component Value Date   WBC 6.9 09/19/2014   HGB 16.5* 09/19/2014   HCT 51.1* 09/19/2014   MCV 93.7 09/19/2014   PLT 171.0 09/19/2014   Lab Results  Component Value Date   TSH 3.17 09/19/2014   Lab Results  Component Value  Date   NA 140 09/19/2014   K 4.6 09/19/2014   CL 104 09/19/2014   CO2 27 09/19/2014   Lab Results  Component Value Date   CREATININE 0.7 09/19/2014     Afib- was never symptomatic but ?RVR this am.  Diagnosed during pre op clearance for cataracts surgery in 2010.  Has been on digoxin and coumadin since.  Rate was normal when I saw her two days ago.   Lab Results  Component Value Date   INR 2.43 09/20/2014   INR 2.09 05/26/2014   INR 2.42 05/02/2014   Denies any CP or palpitations.  Saw Dr. Rockey Situ on 04/25/2013.  Note reviewed. Encouraged her to stay on warfarin, verapamil and digoxin. No other medication changes made.  Hypothyroidism- .  Has been on same dose of synthroid for "as long as she can remember." Denies any symptoms of hypo or hyperthyroidism. Lab Results  Component Value Date   TSH 3.17 09/19/2014   Lab Results  Component Value Date   CREATININE 0.7 09/19/2014       Patient Active Problem List   Diagnosis Date Noted  . Medicare annual wellness visit, subsequent 09/22/2014  . Diarrhea 04/04/2014  . Loss of weight 04/04/2014  . Hearing loss in right ear 12/21/2013  . Allergic rhinitis 12/21/2013  . Gait instability 04/14/2013  . DNR (do not resuscitate) 04/13/2013  . Atrial fibrillation   .  COPD (chronic obstructive pulmonary disease)   . Hypothyroidism    Past Medical History  Diagnosis Date  . Atrial fibrillation   . COPD (chronic obstructive pulmonary disease)   . Hypothyroidism   . Osteoporosis   . Syncope   . Squamous cell carcinoma    Past Surgical History  Procedure Laterality Date  . Appendectomy    . Lipoma excision    . Cataract extraction Bilateral   . Colonoscopy    . Multiple tooth extractions     History  Substance Use Topics  . Smoking status: Former Smoker -- 1.00 packs/day for 40 years    Types: Cigarettes  . Smokeless tobacco: Not on file  . Alcohol Use: Yes     Comment: social   Family History  Problem Relation Age  of Onset  . Alzheimer's disease Mother   . Cancer Brother 55    bladder  . Brain cancer Father    Allergies  Allergen Reactions  . Sulfa Antibiotics Nausea And Vomiting   Current Outpatient Prescriptions on File Prior to Visit  Medication Sig Dispense Refill  . ADVAIR DISKUS 100-50 MCG/DOSE AEPB INHALE 1 PUFF BY MOUTH TWICE DAILY 60 each 1  . Cholecalciferol (VITAMIN D-3) 1000 UNITS CAPS Take by mouth.    Marland Kitchen DIGOX 125 MCG tablet TAKE 1 TABLET BY MOUTH EVERY DAY 90 tablet 3  . diphenhydrAMINE (BENADRYL) 25 MG tablet Take 25 mg by mouth as needed for sleep.     Marland Kitchen levothyroxine (SYNTHROID, LEVOTHROID) 50 MCG tablet TAKE 1 TABLET BY MOUTH ONE DAY, THEN TAKE 1/2 TABLET BY MOUTH THE NEXT AND CONTINUE TO ALTERNATE DOSES 90 tablet 1  . Melatonin 1 MG TABS Take by mouth.    . traZODone (DESYREL) 50 MG tablet TAKE 1 TABLET BY MOUTH EVERY EVENING AS NEEDED FOR INSOMNIA 90 tablet 3  . verapamil (CALAN-SR) 120 MG CR tablet TAKE 1 TABLET BY MOUTH EVERY DAY 90 tablet 3  . warfarin (COUMADIN) 5 MG tablet TAKE AS DIRECTED 90 tablet 1   No current facility-administered medications on file prior to visit.   The PMH, PSH, Social History, Family History, Medications, and allergies have been reviewed in [facility], and have been updated if relevant.  Review of Systems See HPI  +fatigue, malaise +intermittent diarrhea with bloating No gross blood in stool or black stools but IFOB positive this week No nausea but was diaphoretic with last large stool she had + continued unintentional weight loss-  Wt Readings from Last 3 Encounters:  09/22/14 128 lb 8 oz (58.287 kg)  09/19/14 129 lb 12 oz (58.854 kg)  04/25/14 132 lb (59.875 kg)   No LE edema No HA No blurred vision No back pain No rashes No ear pain No difficulty swallowing Denies feeling depressed or anxious- "I like to stay busy."      Objective:   Physical Exam BP 112/60 mmHg  Pulse 175  Temp(Src) 97.3 F (36.3 C) (Oral)  Ht 5\' 9"  (1.753  m)  Wt 128 lb 8 oz (58.287 kg)  BMI 18.97 kg/m2  SpO2 89%   General:  Well-developed,well-nourished,in no acute distress; alert,appropriate and cooperative throughout examination Head:  normocephalic and atraumatic.   Eyes:  vision grossly intact, pupils equal, pupils round, and pupils reactive to light.   Ears:  R ear normal and L ear normal.   Nose:  no external deformity.   Mouth:  good dentition.   Neck:  No deformities, masses, or tenderness noted. Breasts:  No  mass, nodules, thickening, tenderness, bulging, retraction, inflamation, nipple discharge or skin changes noted.   Lungs:  Normal respiratory effort, chest expands symmetrically. Lungs are clear to auscultation, no crackles or wheezes. Heart:  irregularly irregular- rate improved from this am Abdomen:  Bowel sounds positive,abdomen soft and non-tender without masses, organomegaly or hernias noted. Msk:  No deformity or scoliosis noted of thoracic or lumbar spine.   Extremities:  No clubbing, cyanosis, edema, or deformity noted with normal full range of motion of all joints.   Neurologic:  alert & oriented X3 and gait normal.   Skin:  Intact without suspicious lesions or rashes Cervical Nodes:  No lymphadenopathy noted Axillary Nodes:  No palpable lymphadenopathy Psych:  Cognition and judgment appear intact. Alert and cooperative with normal attention span and concentration. No apparent delusions, illusions, hallucinations     Assessment & Plan:

## 2014-09-23 ENCOUNTER — Encounter: Payer: Self-pay | Admitting: Family Medicine

## 2014-09-26 ENCOUNTER — Ambulatory Visit (INDEPENDENT_AMBULATORY_CARE_PROVIDER_SITE_OTHER): Payer: Medicare Other | Admitting: Cardiovascular Disease

## 2014-09-26 ENCOUNTER — Encounter: Payer: Self-pay | Admitting: Cardiovascular Disease

## 2014-09-26 VITALS — BP 112/78 | HR 71 | Ht 69.0 in | Wt 128.5 lb

## 2014-09-26 DIAGNOSIS — I4891 Unspecified atrial fibrillation: Secondary | ICD-10-CM

## 2014-09-26 DIAGNOSIS — R634 Abnormal weight loss: Secondary | ICD-10-CM

## 2014-09-26 DIAGNOSIS — E785 Hyperlipidemia, unspecified: Secondary | ICD-10-CM

## 2014-09-26 DIAGNOSIS — R197 Diarrhea, unspecified: Secondary | ICD-10-CM | POA: Diagnosis not present

## 2014-09-26 DIAGNOSIS — J439 Emphysema, unspecified: Secondary | ICD-10-CM | POA: Diagnosis not present

## 2014-09-26 NOTE — Assessment & Plan Note (Signed)
Stable breathing, on Advair

## 2014-09-26 NOTE — Assessment & Plan Note (Signed)
She is reluctant to go out of the [place] area to see GI. She has an appointment in February 2016. She does not think that her GI issues are a problem. She feels that everything will look better on her next stool study. She does not seem bothered by her diarrhea.

## 2014-09-26 NOTE — Patient Instructions (Signed)
You are doing well. No medication changes were made.  Please call us if you have new issues that need to be addressed before your next appt.  Your physician wants you to follow-up in: 6 months.  You will receive a reminder letter in the mail two months in advance. If you don't receive a letter, please call our office to schedule the follow-up appointment.   

## 2014-09-26 NOTE — Assessment & Plan Note (Signed)
She continues to have chronic atrial fibrillation. Rate is well controlled on her visit to primary care with heart rate in the 80s, and on her visit today with heart rate of 75. No changes to her medications. She is tolerating anticoagulation

## 2014-09-26 NOTE — Progress Notes (Signed)
Patient ID: [name], female    DOB: 1931-10-04, 81 y.o.   MRN: [id number]  HPI Comments: [name] is a 81 year old woman with chronic atrial fibrillation, COPD, 40 years of smoking, on warfarin and rate control medications who presents for routine followup of her atrial fibrillation. She lives at [address] [place].  In follow-up today, she reports having recent episodes of diarrhea, sometimes explosive. It seems to come and go. She is not very concerned by it as it has been a chronic issue. She does not take Imodium. She reports her weight is down several pounds. She is otherwise eating well. She denies any tachycardia or shortness of breath She does a very aggressive exercise class and feels that it is too aggressive, too much. She feels exhausted after this is done. The trainer continues to push her harder and harder No recent falls.   EKG last week with primary care showed atrial fibrillation with ventricular rate in the 80s, significant baseline artifact EKG today shows atrial fibrillation with ventricular rate 75 bpm, poor R-wave progression to the anterior precordial leads, left axis deviation otherwise both EKGs are unchanged from prior EKGs in 2015  Other past medical history Prior cardiac notes from Dr. Sheppard Coil in [place] indicate she has chronic atrial fibrillation,  echocardiogram in 2010 and March 2014 confirming atrial fibrillation. No old EKGs are available.  Echocardiogram from March 2014 shows normal LV systolic function, severely dilated left and right atrium, mildly elevated right ventricular systolic pressure estimated at 35 mm of mercury  Allergies  Allergen Reactions  . Sulfa Antibiotics Nausea And Vomiting    Outpatient Encounter Prescriptions as of 09/26/2014  Medication Sig  . ADVAIR DISKUS 100-50 MCG/DOSE AEPB INHALE 1 PUFF BY MOUTH TWICE DAILY  . Cholecalciferol (VITAMIN D-3) 1000 UNITS CAPS Take 1,000 Units by mouth every other day.   Marland Kitchen DIGOX 125 MCG tablet  TAKE 1 TABLET BY MOUTH EVERY DAY  . diphenhydrAMINE (BENADRYL) 25 MG tablet Take 25 mg by mouth as needed for sleep.   . Lactobacillus (ACIDOPHILUS PO) Take by mouth.  . levothyroxine (SYNTHROID, LEVOTHROID) 50 MCG tablet TAKE 1 TABLET BY MOUTH ONE DAY, THEN TAKE 1/2 TABLET BY MOUTH THE NEXT AND CONTINUE TO ALTERNATE DOSES  . Melatonin 1 MG TABS Take by mouth.  . traZODone (DESYREL) 50 MG tablet TAKE 1 TABLET BY MOUTH EVERY EVENING AS NEEDED FOR INSOMNIA  . verapamil (CALAN-SR) 120 MG CR tablet TAKE 1 TABLET BY MOUTH EVERY DAY  . warfarin (COUMADIN) 5 MG tablet TAKE AS DIRECTED    Past Medical History  Diagnosis Date  . Atrial fibrillation   . COPD (chronic obstructive pulmonary disease)   . Hypothyroidism   . Osteoporosis   . Syncope   . Squamous cell carcinoma     Past Surgical History  Procedure Laterality Date  . Appendectomy    . Lipoma excision    . Cataract extraction Bilateral   . Colonoscopy    . Multiple tooth extractions      Social History  reports that she has quit smoking. Her smoking use included Cigarettes. She has a 40 pack-year smoking history. She does not have any smokeless tobacco history on file. She reports that she drinks alcohol. She reports that she does not use illicit drugs.  Family History family history includes Alzheimer's disease in her mother; Brain cancer in her father; Cancer (age of onset: 29) in her brother.  Review of Systems  Constitutional: Negative.   Respiratory: Negative.  Cardiovascular: Negative.   Gastrointestinal: Negative.   Musculoskeletal: Positive for gait problem.  Skin: Negative.   Neurological: Negative.   Hematological: Negative.   Psychiatric/Behavioral: Negative.   All other systems reviewed and are negative.   BP 112/78 mmHg  Pulse 71  Ht 5\' 9"  (1.753 m)  Wt 128 lb 8 oz (58.287 kg)  BMI 18.97 kg/m2  Physical Exam  Constitutional: She is oriented to person, place, and time.  Very thin  HENT:  Head:  Normocephalic.  Nose: Nose normal.  Mouth/Throat: Oropharynx is clear and moist.  Eyes: Conjunctivae are normal. Pupils are equal, round, and reactive to light.  Neck: Normal range of motion. Neck supple. No JVD present.  Cardiovascular: Normal rate, regular rhythm, S1 normal, S2 normal, normal heart sounds and intact distal pulses.  An irregularly irregular rhythm present. Exam reveals no gallop and no friction rub.   No murmur heard. Pulmonary/Chest: Effort normal and breath sounds normal. No respiratory distress. She has no wheezes. She has no rales. She exhibits no tenderness.  Abdominal: Soft. Bowel sounds are normal. She exhibits no distension. There is no tenderness.  Musculoskeletal: Normal range of motion. She exhibits no edema or tenderness.  Lymphadenopathy:    She has no cervical adenopathy.  Neurological: She is alert and oriented to person, place, and time. Coordination normal.  Skin: Skin is warm and dry. No rash noted. No erythema.  Psychiatric: She has a normal mood and affect. Her behavior is normal. Judgment and thought content normal.    Assessment and Plan  Nursing note and vitals reviewed.

## 2014-09-26 NOTE — Assessment & Plan Note (Signed)
She is aware of her elevated cholesterol. She did not seem particularly interested in cholesterol medication at this time. We will discuss with her again in follow-up

## 2014-09-26 NOTE — Assessment & Plan Note (Signed)
Close to 3 pound weight loss from her prior clinic visit. She is working out aggressively. Suggested she try to increase her calorie intake, decrease the intensity of her workouts

## 2014-09-27 DIAGNOSIS — H40003 Preglaucoma, unspecified, bilateral: Secondary | ICD-10-CM | POA: Diagnosis not present

## 2014-09-27 DIAGNOSIS — Z961 Presence of intraocular lens: Secondary | ICD-10-CM | POA: Diagnosis not present

## 2014-09-29 ENCOUNTER — Ambulatory Visit (INDEPENDENT_AMBULATORY_CARE_PROVIDER_SITE_OTHER): Payer: Medicare Other | Admitting: *Deleted

## 2014-09-29 DIAGNOSIS — Z23 Encounter for immunization: Secondary | ICD-10-CM

## 2014-10-03 ENCOUNTER — Other Ambulatory Visit: Payer: Medicare Other

## 2014-10-04 ENCOUNTER — Telehealth: Payer: Self-pay | Admitting: Family Medicine

## 2014-10-04 ENCOUNTER — Other Ambulatory Visit (INDEPENDENT_AMBULATORY_CARE_PROVIDER_SITE_OTHER): Payer: Medicare Other

## 2014-10-04 ENCOUNTER — Other Ambulatory Visit: Payer: Self-pay | Admitting: Family Medicine

## 2014-10-04 DIAGNOSIS — Z1211 Encounter for screening for malignant neoplasm of colon: Secondary | ICD-10-CM

## 2014-10-04 DIAGNOSIS — R195 Other fecal abnormalities: Secondary | ICD-10-CM

## 2014-10-04 DIAGNOSIS — R634 Abnormal weight loss: Secondary | ICD-10-CM

## 2014-10-04 DIAGNOSIS — R197 Diarrhea, unspecified: Secondary | ICD-10-CM

## 2014-10-04 LAB — FECAL OCCULT BLOOD, IMMUNOCHEMICAL: Fecal Occult Bld: POSITIVE — AB

## 2014-10-04 NOTE — Telephone Encounter (Signed)
Pt states that she is returning a call from [facility], Please give pt a call back @ [phone].

## 2014-10-05 NOTE — Telephone Encounter (Signed)
Spoke to pt and informed her of results; see result note

## 2014-10-17 DIAGNOSIS — I482 Chronic atrial fibrillation: Secondary | ICD-10-CM | POA: Diagnosis not present

## 2014-10-18 DIAGNOSIS — K529 Noninfective gastroenteritis and colitis, unspecified: Secondary | ICD-10-CM | POA: Diagnosis not present

## 2014-10-18 DIAGNOSIS — R195 Other fecal abnormalities: Secondary | ICD-10-CM | POA: Diagnosis not present

## 2014-10-21 ENCOUNTER — Telehealth: Payer: Self-pay

## 2014-10-21 ENCOUNTER — Telehealth: Payer: Self-pay | Admitting: Family Medicine

## 2014-10-21 NOTE — Telephone Encounter (Signed)
Spoke w/ receptionist @ [facility]. Asked her to let Amy know that we have not received clearance request for this pt.

## 2014-10-21 NOTE — Telephone Encounter (Signed)
Amy is calling to see if you received the form for patient to hold Coumadin for 5 days and a risk assessment for Cardiac clearance for a Colonoscopy.  He would take his last Coumadin on 10/25/14.  Amy would like to know as soon as possible, so she can contact the patient.  The form is on your desk and she said she sent another form on 10/18/14.

## 2014-10-21 NOTE — Telephone Encounter (Signed)
Spoke to Amy and informed her form was faxed to Dr Rockey Situ, per Dr Deborra Medina,

## 2014-10-21 NOTE — Telephone Encounter (Signed)
Nurse calling regardign clearance for colonoscopy, states it was faxed this morning, needs asap.

## 2014-10-21 NOTE — Telephone Encounter (Signed)
She has a cardiologist so I requested that it be sent to cardiology for cardiac clearance.

## 2014-10-22 DIAGNOSIS — K529 Noninfective gastroenteritis and colitis, unspecified: Secondary | ICD-10-CM | POA: Insufficient documentation

## 2014-10-25 ENCOUNTER — Telehealth: Payer: Self-pay | Admitting: Family Medicine

## 2014-10-25 ENCOUNTER — Telehealth: Payer: Self-pay

## 2014-10-25 NOTE — Telephone Encounter (Signed)
Spoke w/ [name] @ [place] [facility]. Advised her that I will be happy to educate pt on Lovenox injection and send in rx if needed.  Left message for pt to call back to discuss when she is available to set this up.

## 2014-10-25 NOTE — Telephone Encounter (Signed)
Spoke to [place], Therapist, sports at [facility] and advised her of current situation. She states that she will look more in depth and contact me back with a possible resolution

## 2014-10-25 NOTE — Telephone Encounter (Signed)
Per Christell Faith, PA, pt is cleared to proceed w/ surgery. "Discontinue coumadin x 5 days prior to procedure.  Start Lovenox 2 days after stopping warfarin (Lovenox 19 mg BID), stop 24 hrs prior to procedure.  Restart 24 hrs post procedure if ok w/ MD (Lovenox & coumadin).  Continue both until therapeutic INR (2.0-3.0), then stop Lovenox.  Sched coumadin clinic f/u). Faxed to [facility] at [phone].

## 2014-10-25 NOTE — Telephone Encounter (Signed)
Spoke to [place], Therapist, sports who states that she has spoken with Leafy Ro at Dr Donivan Scull office and they will contact pt directly for Lovenox teaching. Contacted Amy at [facility] and advised.

## 2014-10-25 NOTE — Telephone Encounter (Signed)
Spoke w/ pt.  Advised her that I am sending in rx for Lovenox and scheduling her an appt to come in tomorrow am for injection education.  Pt is quite upset about this, as she states that her roommate "busted a blood vessel" by self administering lovenox. Advised pt that bruising is a side effect and will most likely occur and this may have been what her friend experienced.  Pt is concerned about the cost, asking if our office will pay for the Lovenox or if it will be her financial responsibility.  Pt asks that I call Dr. Jackalyn Lombard office and see if she definitely needs the colonoscopy as she "doesn't feel good about it".  Spoke w/ Amy @ [facility].  Advised her that pt is sched for nurse visit tomorrow for Lovenox education and that our coumadin nurse will be in the office to answer any questions she may have.   Asked Ignacia Bayley, NP to review Lovenox instructions.  He is reviewing the dose and will speak w/ Dr. Rockey Situ to see if we can avoid the Lovenox altogether.

## 2014-10-25 NOTE — Telephone Encounter (Signed)
Amy @ [facility] called She sent form for ms Deshler from dr [name] office on recommentations for pt coumadin dosage today  She needs answer to this or she will have to r/s ms Mealor   Pt will have to be r/s for her colonscopy if this has not be taken care of She faxed this form to dr Deborra Medina office on 10/20/14   Please call amy if you have any questions about the form [phone]

## 2014-10-25 NOTE — Telephone Encounter (Signed)
Spoke w/ [name].  Advised her that Dr. Hulen Shouts office follows and doses pt's coumadin, so she will need appt

## 2014-10-25 NOTE — Telephone Encounter (Signed)
I have never done lovenox teaching or been asked to do so but if you call coumadin clinic RN at [facility] and find out exactly what this entails (ie how to given injection, where to give injection, risks), we would be happy to do it here.

## 2014-10-25 NOTE — Telephone Encounter (Signed)
Dr. Reuel Derby nurse called, states they need to know when pt needs f/u for coumadin, Lovenox education. Please call.

## 2014-10-25 NOTE — Telephone Encounter (Signed)
Coumadin Clinic nurse in [place] P[facility] called, states that Dr. Deborra Medina office does not have a nurse to educate pt on Lovenox,(nurse has taken another job) and asks can we educate her here in [place]. Please call and advise.

## 2014-10-25 NOTE — Telephone Encounter (Signed)
Advised Amy to have pt f/u w/ coumadin clinic in PCP's office, as they monitor pt's coumadin dosing. She verbalizes understanding and will call back w/ any further questions or concerns.

## 2014-10-25 NOTE — Telephone Encounter (Signed)
Spoke w/ pt.  Advised her that I did not sent in Lovenox rx, I am cancelling her nurse visit tomorrow, as she will not need Lovenox. She is very grateful for this and understands to hold her coumadin x 5 days before procedure.  Asked her to call back w/ any questions or concerns.

## 2014-10-25 NOTE — Telephone Encounter (Signed)
Spoke to [name] at [facility]. She states that she has spoken with Dr Rockey Situ and they have refused to assist in this matter any further, as Dr Deborra Medina manages pt's coumadin. Advised [name] that we no longer have a Coumadin clinic at this location, while the clinical lead is out. [name] verbally expressed understanding and states that last refill and dosing instructions are from Dr Deborra Medina and Dr Rockey Situ states that it has to be taken care of through [facility]. [name] states that the pt is very upset and is not wanting to reschedule procedure. Advised [name] that I would contact her before the end of today with a resolution.

## 2014-10-25 NOTE — Telephone Encounter (Signed)
Spoke w/pt. °

## 2014-10-25 NOTE — Telephone Encounter (Signed)
Mackenzie Bayley, NP discussed w/ Dr. Rockey Situ and they both agree that pt will hold coumadin x 5 days before colonoscopy w/ no Lovenox bridge.   Left message for pt to call back.   Left message for Amy to call back if she needs a new clearance signed.

## 2014-10-27 ENCOUNTER — Telehealth: Payer: Self-pay | Admitting: Cardiovascular Disease

## 2014-10-27 NOTE — Telephone Encounter (Signed)
Patient is having GI procedure tomorrow and Nursing at Facility would like to know when and how much to resume blood thinner.  Please call back.

## 2014-10-27 NOTE — Telephone Encounter (Signed)
Left message on nursing line that our office cleared pt to hold coumadin x 5 days prior to colonoscopy and it is up to the discretion of performing MD when to resume.  Asked her to call back w/ any questions or concerns.

## 2014-10-31 ENCOUNTER — Ambulatory Visit: Payer: Self-pay | Admitting: Gastroenterology

## 2014-10-31 DIAGNOSIS — D122 Benign neoplasm of ascending colon: Secondary | ICD-10-CM | POA: Diagnosis not present

## 2014-10-31 DIAGNOSIS — R197 Diarrhea, unspecified: Secondary | ICD-10-CM | POA: Diagnosis not present

## 2014-10-31 DIAGNOSIS — R634 Abnormal weight loss: Secondary | ICD-10-CM | POA: Diagnosis not present

## 2014-10-31 DIAGNOSIS — K648 Other hemorrhoids: Secondary | ICD-10-CM | POA: Diagnosis not present

## 2014-10-31 DIAGNOSIS — K5289 Other specified noninfective gastroenteritis and colitis: Secondary | ICD-10-CM | POA: Diagnosis not present

## 2014-10-31 DIAGNOSIS — K635 Polyp of colon: Secondary | ICD-10-CM | POA: Diagnosis not present

## 2014-10-31 DIAGNOSIS — D124 Benign neoplasm of descending colon: Secondary | ICD-10-CM | POA: Diagnosis not present

## 2014-10-31 DIAGNOSIS — K640 First degree hemorrhoids: Secondary | ICD-10-CM | POA: Diagnosis not present

## 2014-10-31 DIAGNOSIS — Z882 Allergy status to sulfonamides status: Secondary | ICD-10-CM | POA: Diagnosis not present

## 2014-10-31 DIAGNOSIS — I10 Essential (primary) hypertension: Secondary | ICD-10-CM | POA: Diagnosis not present

## 2014-10-31 LAB — HM COLONOSCOPY: HM COLON: NORMAL

## 2014-11-01 ENCOUNTER — Encounter: Payer: Self-pay | Admitting: Family Medicine

## 2014-11-07 ENCOUNTER — Encounter: Payer: Self-pay | Admitting: Family Medicine

## 2014-11-14 DIAGNOSIS — I482 Chronic atrial fibrillation: Secondary | ICD-10-CM | POA: Diagnosis not present

## 2014-11-28 DIAGNOSIS — I482 Chronic atrial fibrillation: Secondary | ICD-10-CM | POA: Diagnosis not present

## 2014-12-06 ENCOUNTER — Other Ambulatory Visit: Payer: Self-pay | Admitting: Family Medicine

## 2014-12-14 IMAGING — MG MM DIGITAL SCREENING BILAT W/ CAD
1 series · 4 of 4 positions shown · non-contrast
Comparison: Previous exam(s).

CLINICAL DATA: Screening.

EXAM:
DIGITAL SCREENING BILATERAL MAMMOGRAM WITH CAD

[R CC · right · 4 of 4 slices shown]
[im 1/4]
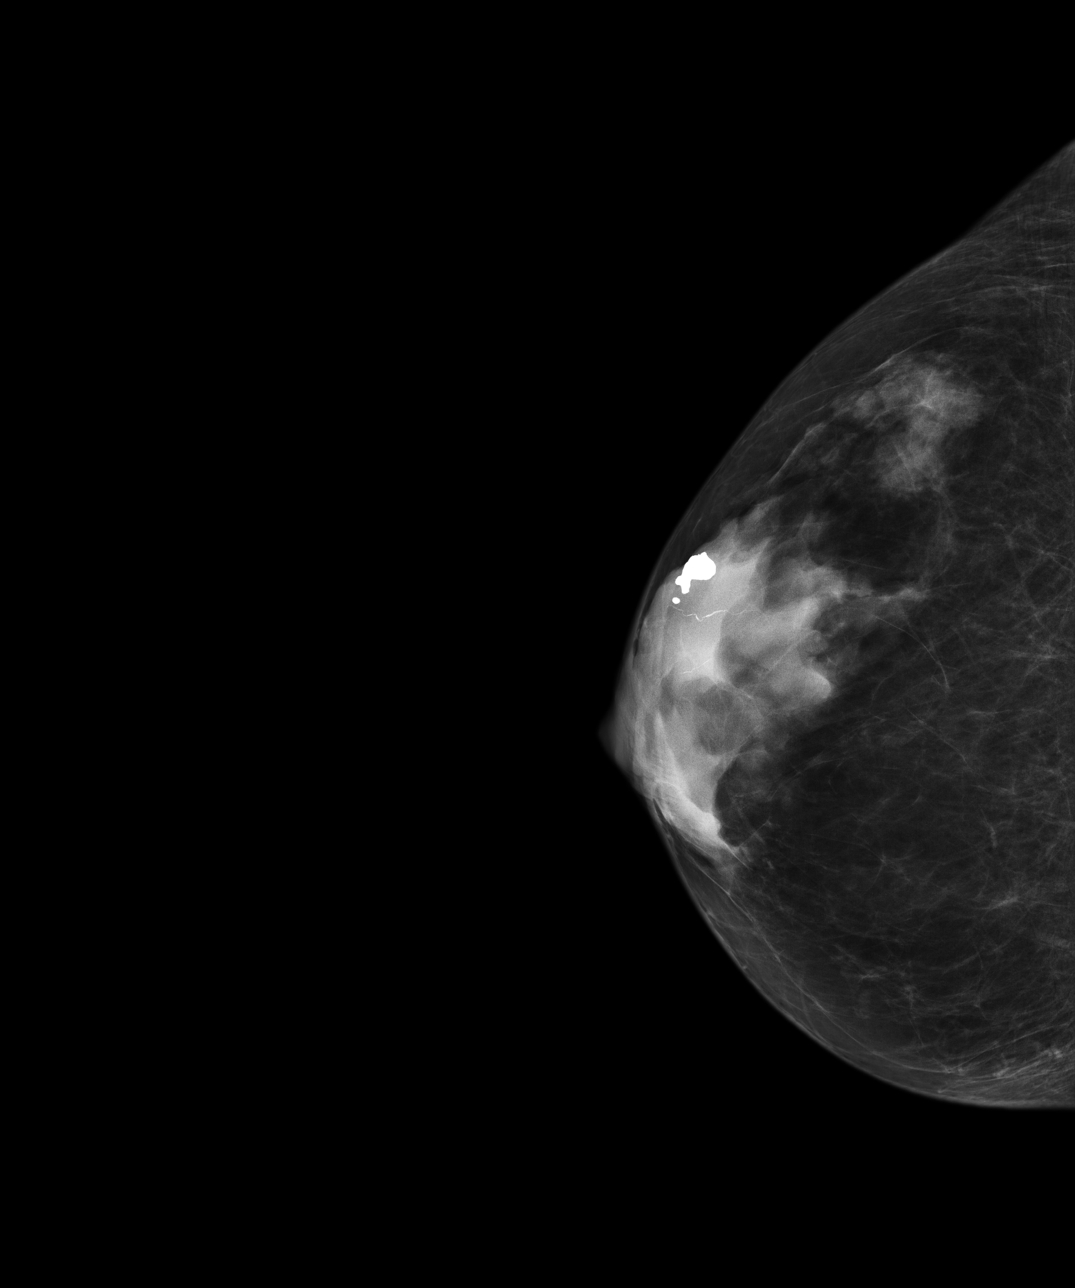
[im 2/4]
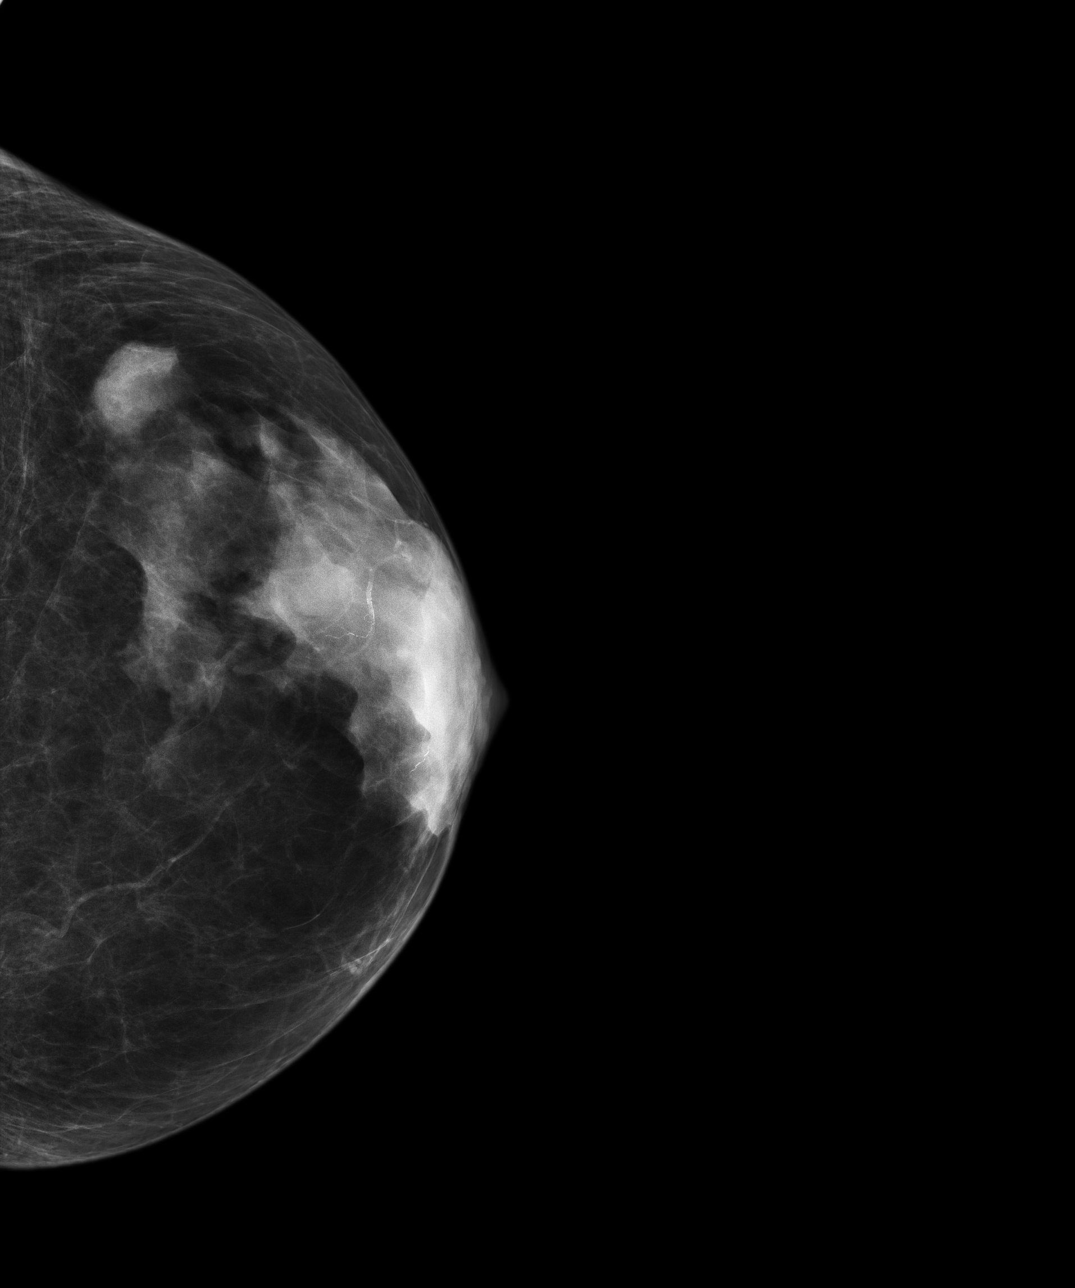
[im 3/4]
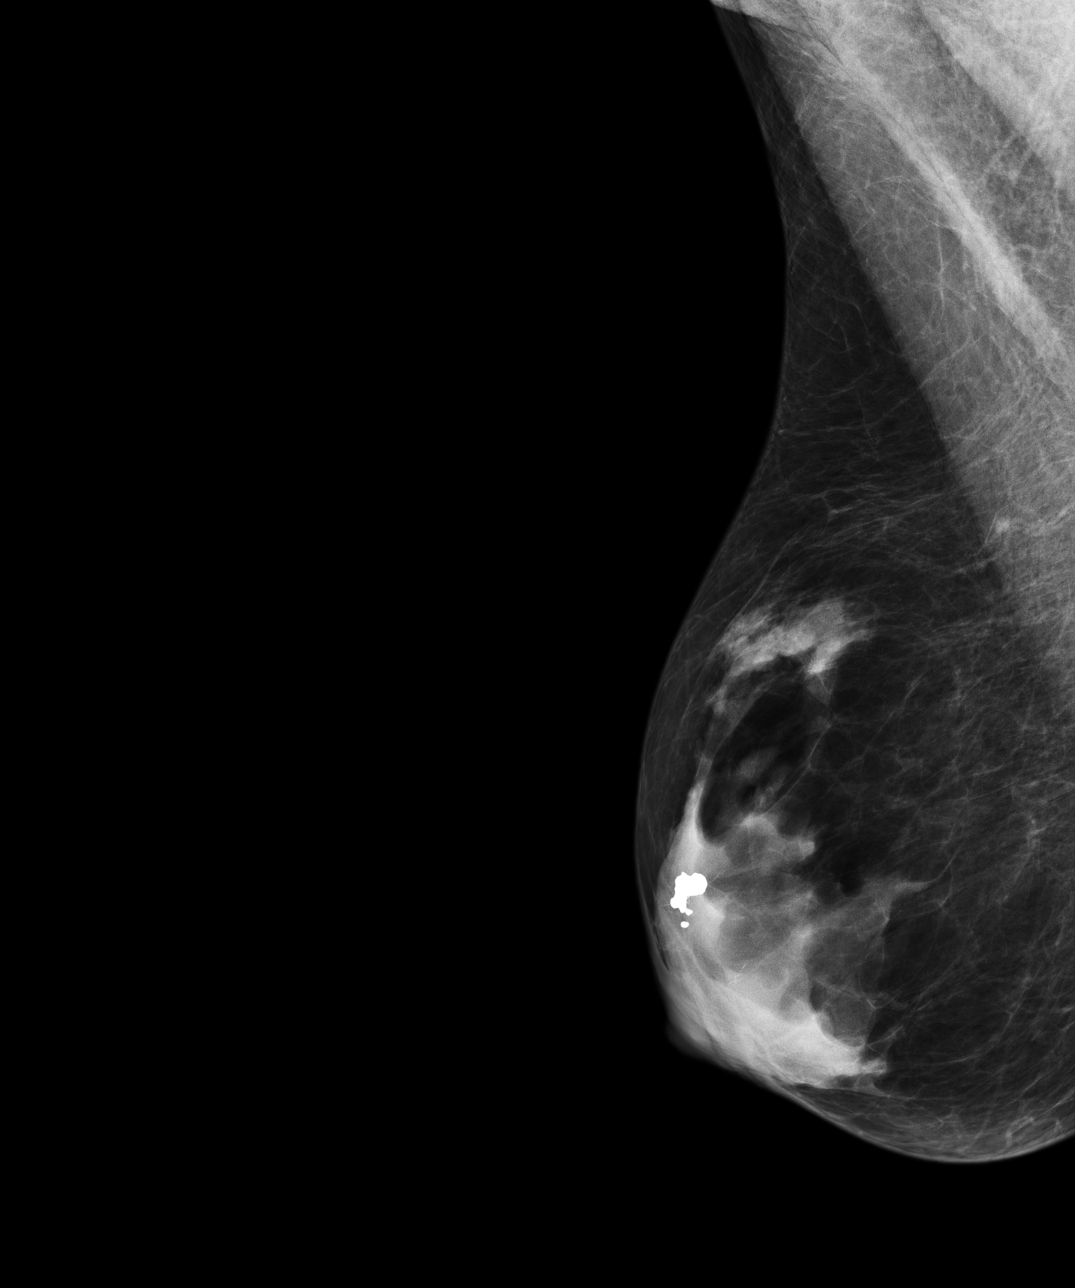
[im 4/4]
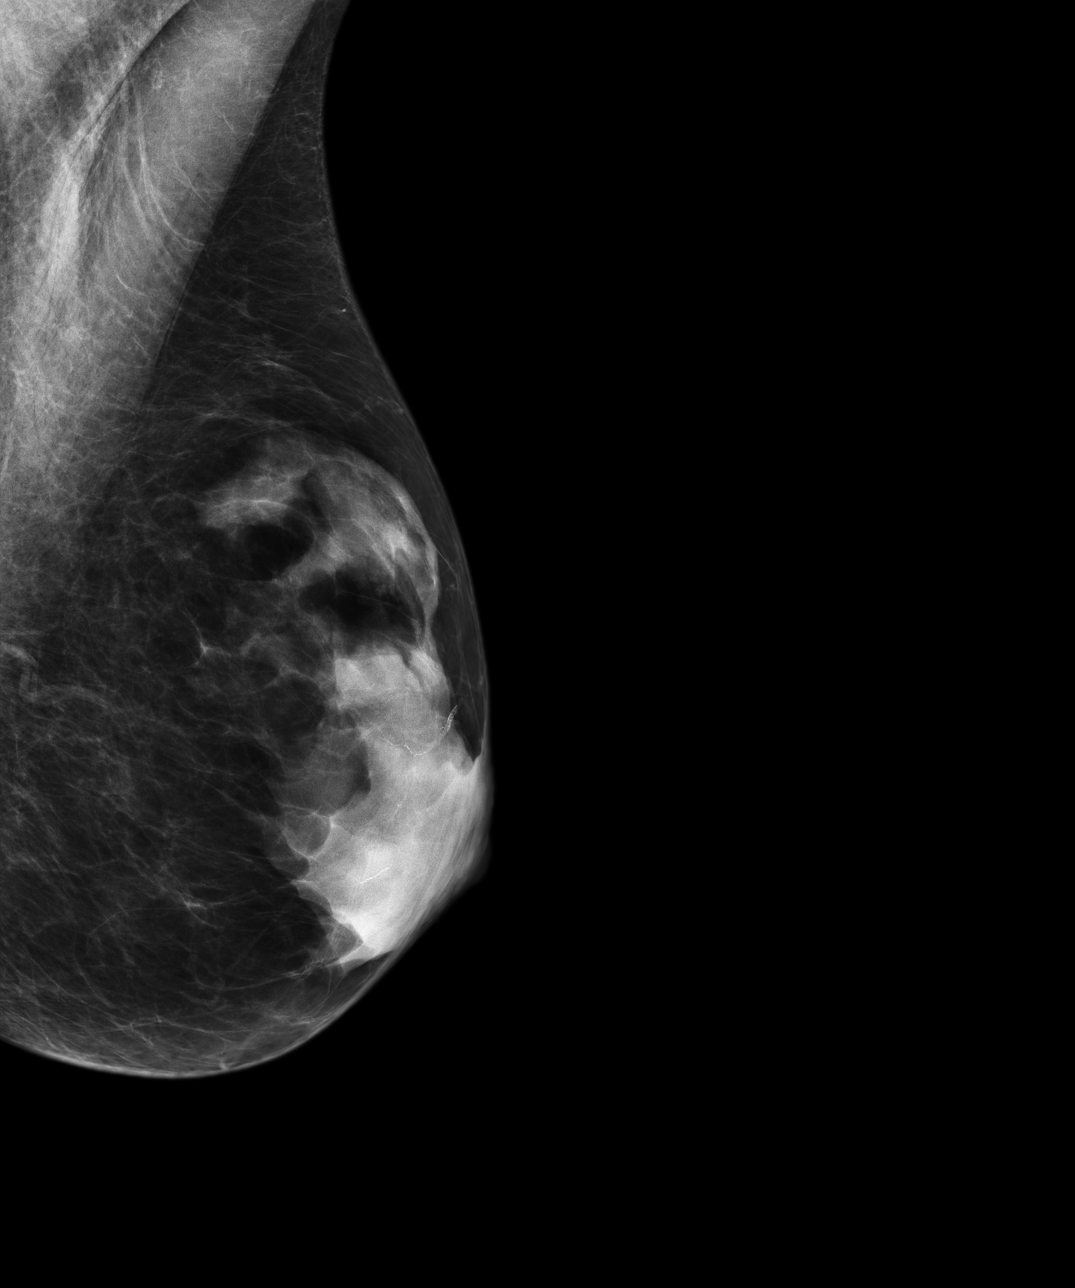

[4 of 4 positions shown; findings below may reference images not displayed]

ACR Breast Density Category c: The breasts are heterogeneously
dense, which may obscure small masses.
FINDINGS: There are no findings suspicious for malignancy. Images were
processed with CAD.
IMPRESSION: No mammographic evidence of malignancy. A result letter of this
screening mammogram will be mailed directly to the patient.

RECOMMENDATION:
Screening mammogram in one year. (Code:ZN-B-8X6)

BI-RADS CATEGORY  1: Negative

## 2014-12-26 DIAGNOSIS — I482 Chronic atrial fibrillation: Secondary | ICD-10-CM | POA: Diagnosis not present

## 2014-12-27 DIAGNOSIS — K529 Noninfective gastroenteritis and colitis, unspecified: Secondary | ICD-10-CM | POA: Diagnosis not present

## 2014-12-27 DIAGNOSIS — K52839 Microscopic colitis, unspecified: Secondary | ICD-10-CM | POA: Insufficient documentation

## 2014-12-27 DIAGNOSIS — K5289 Other specified noninfective gastroenteritis and colitis: Secondary | ICD-10-CM | POA: Diagnosis not present

## 2015-01-09 LAB — SURGICAL PATHOLOGY

## 2015-01-26 DIAGNOSIS — I482 Chronic atrial fibrillation: Secondary | ICD-10-CM | POA: Diagnosis not present

## 2015-01-31 ENCOUNTER — Other Ambulatory Visit: Payer: Self-pay | Admitting: Cardiovascular Disease

## 2015-02-27 DIAGNOSIS — I482 Chronic atrial fibrillation: Secondary | ICD-10-CM | POA: Diagnosis not present

## 2015-03-06 ENCOUNTER — Other Ambulatory Visit: Payer: Self-pay | Admitting: *Deleted

## 2015-03-06 MED ORDER — VERAPAMIL HCL ER 120 MG PO TBCR: 120.0000 mg | EXTENDED_RELEASE_TABLET | Freq: Every day | ORAL | Status: DC

## 2015-03-22 DIAGNOSIS — Z85828 Personal history of other malignant neoplasm of skin: Secondary | ICD-10-CM | POA: Diagnosis not present

## 2015-03-22 DIAGNOSIS — L821 Other seborrheic keratosis: Secondary | ICD-10-CM | POA: Diagnosis not present

## 2015-03-22 DIAGNOSIS — L570 Actinic keratosis: Secondary | ICD-10-CM | POA: Diagnosis not present

## 2015-03-22 DIAGNOSIS — D485 Neoplasm of uncertain behavior of skin: Secondary | ICD-10-CM | POA: Diagnosis not present

## 2015-03-22 DIAGNOSIS — L820 Inflamed seborrheic keratosis: Secondary | ICD-10-CM | POA: Diagnosis not present

## 2015-03-22 DIAGNOSIS — X32XXXA Exposure to sunlight, initial encounter: Secondary | ICD-10-CM | POA: Diagnosis not present

## 2015-03-27 ENCOUNTER — Ambulatory Visit (INDEPENDENT_AMBULATORY_CARE_PROVIDER_SITE_OTHER): Payer: Medicare Other | Admitting: *Deleted

## 2015-03-27 DIAGNOSIS — I482 Chronic atrial fibrillation: Secondary | ICD-10-CM | POA: Diagnosis not present

## 2015-03-27 LAB — POCT INR: INR: 2.31

## 2015-03-27 NOTE — Progress Notes (Signed)
Pre visit review using our clinic review tool, if applicable. No additional management support is needed unless otherwise documented below in the visit note. 

## 2015-04-14 ENCOUNTER — Encounter: Payer: Self-pay | Admitting: Cardiovascular Disease

## 2015-04-14 ENCOUNTER — Ambulatory Visit (INDEPENDENT_AMBULATORY_CARE_PROVIDER_SITE_OTHER): Payer: Medicare Other | Admitting: Cardiovascular Disease

## 2015-04-14 VITALS — BP 110/80 | HR 80 | Ht 69.0 in | Wt 129.5 lb

## 2015-04-14 DIAGNOSIS — I4891 Unspecified atrial fibrillation: Secondary | ICD-10-CM

## 2015-04-14 DIAGNOSIS — R0602 Shortness of breath: Secondary | ICD-10-CM

## 2015-04-14 DIAGNOSIS — R5381 Other malaise: Secondary | ICD-10-CM | POA: Diagnosis not present

## 2015-04-14 DIAGNOSIS — E785 Hyperlipidemia, unspecified: Secondary | ICD-10-CM

## 2015-04-14 DIAGNOSIS — R197 Diarrhea, unspecified: Secondary | ICD-10-CM

## 2015-04-14 DIAGNOSIS — R634 Abnormal weight loss: Secondary | ICD-10-CM

## 2015-04-14 NOTE — Assessment & Plan Note (Signed)
She is aware of her elevated cholesterol. She did not seem particularly interested in cholesterol medication at this time.

## 2015-04-14 NOTE — Progress Notes (Signed)
Patient ID: [name], female    DOB: 1932/08/04, 81 y.o.   MRN: [id number]  HPI Comments: [name] is a 81 year old woman with chronic atrial fibrillation, COPD, 40 years of smoking, on warfarin and rate control medications who presents for routine followup of her atrial fibrillation. She lives at [address].  In follow-up today, she reports having periods of malaise, feels clammy. She has been less active in the heat. She reports weight has been relatively stable, she is trying to eat more. Significant bruising of her lower extremities, very thin skin Occasionally has palpitations are a strong heartbeat No blood pressure cuff at home Initial blood pressure was low, up to 379 systolic on my check Diarrhea stable, none recently  EKG on today's visit shows atrial fibrillation with ventricular rate 80 bpm, no significant ST or T-wave changes  Other past medical history Prior cardiac notes from Dr. Sheppard Coil in [place] indicate she has chronic atrial fibrillation,  echocardiogram in 2010 and March 2014 confirming atrial fibrillation. No old EKGs are available.  Echocardiogram from March 2014 shows normal LV systolic function, severely dilated left and right atrium, mildly elevated right ventricular systolic pressure estimated at 35 mm of mercury  Allergies  Allergen Reactions  . Sulfa Antibiotics Nausea And Vomiting    Outpatient Encounter Prescriptions as of 04/14/2015  Medication Sig  . ADVAIR DISKUS 100-50 MCG/DOSE AEPB INHALE 1 PUFF BY MOUTH TWICE DAILY  . Cholecalciferol (VITAMIN D-3) 1000 UNITS CAPS Take 1,000 Units by mouth every other day.   Marland Kitchen DIGOX 125 MCG tablet TAKE 1 TABLET BY MOUTH EVERY DAY  . diphenhydrAMINE (BENADRYL) 25 MG tablet Take 25 mg by mouth as needed for sleep.   . Lactobacillus (ACIDOPHILUS PO) Take by mouth.  . levothyroxine (SYNTHROID, LEVOTHROID) 50 MCG tablet TAKE 1 TABLET BY MOUTH EVERY OTHER DAY AND ALTERNATE WITH 1/2 TABLET  . Melatonin 1 MG TABS  Take by mouth.  . traZODone (DESYREL) 50 MG tablet TAKE 1 TABLET BY MOUTH EVERY EVENING AS NEEDED FOR INSOMNIA  . verapamil (CALAN-SR) 120 MG CR tablet Take 1 tablet (120 mg total) by mouth daily.  Marland Kitchen warfarin (COUMADIN) 5 MG tablet TAKE AS DIRECTED   No facility-administered encounter medications on file as of 04/14/2015.    Past Medical History  Diagnosis Date  . Atrial fibrillation   . COPD (chronic obstructive pulmonary disease)   . Hypothyroidism   . Osteoporosis   . Syncope   . Squamous cell carcinoma   . Microscopic colitis     Past Surgical History  Procedure Laterality Date  . Appendectomy    . Lipoma excision    . Cataract extraction Bilateral   . Colonoscopy    . Multiple tooth extractions      Social History  reports that she has quit smoking. Her smoking use included Cigarettes. She has a 40 pack-year smoking history. She does not have any smokeless tobacco history on file. She reports that she drinks alcohol. She reports that she does not use illicit drugs.  Family History family history includes Alzheimer's disease in her mother; Brain cancer in her father; Cancer (age of onset: 36) in her brother.  Review of Systems  Constitutional: Positive for fatigue.  Respiratory: Negative.   Cardiovascular: Negative.   Gastrointestinal: Negative.   Musculoskeletal: Positive for gait problem.  Skin: Negative.   Neurological: Negative.   Hematological: Negative.   Psychiatric/Behavioral: Negative.   All other systems reviewed and are negative.   BP 110/80  mmHg  Pulse 80  Ht 5\' 9"  (1.753 m)  Wt 129 lb 8 oz (58.741 kg)  BMI 19.12 kg/m2  Physical Exam  Constitutional: She is oriented to person, place, and time.  Very thin  HENT:  Head: Normocephalic.  Nose: Nose normal.  Mouth/Throat: Oropharynx is clear and moist.  Eyes: Conjunctivae are normal. Pupils are equal, round, and reactive to light.  Neck: Normal range of motion. Neck supple. No JVD present.   Cardiovascular: Normal rate, S1 normal, S2 normal, normal heart sounds and intact distal pulses.  An irregularly irregular rhythm present. Exam reveals no gallop and no friction rub.   No murmur heard. Pulmonary/Chest: Effort normal and breath sounds normal. No respiratory distress. She has no wheezes. She has no rales. She exhibits no tenderness.  Abdominal: Soft. Bowel sounds are normal. She exhibits no distension. There is no tenderness.  Musculoskeletal: Normal range of motion. She exhibits no edema or tenderness.  Lymphadenopathy:    She has no cervical adenopathy.  Neurological: She is alert and oriented to person, place, and time. Coordination normal.  Skin: Skin is warm and dry. No rash noted. No erythema.  Psychiatric: She has a normal mood and affect. Her behavior is normal. Judgment and thought content normal.    Assessment and Plan  Nursing note and vitals reviewed.

## 2015-04-14 NOTE — Patient Instructions (Addendum)
You are doing well. No medication changes were made.  Consider buying a blood pressure cuff if you have periods of malaise  Please call us if you have new issues that need to be addressed before your next appt.  Your physician wants you to follow-up in: 12 months.  You will receive a reminder letter in the mail two months in advance. If you don't receive a letter, please call our office to schedule the follow-up appointment.

## 2015-04-14 NOTE — Assessment & Plan Note (Signed)
Previously reported having fatigue, malaise. Etiology unclear. Sometimes has a clammy feeling. Recommended she monitor blood pressure at home. This is borderline low Possibly from the heat

## 2015-04-14 NOTE — Assessment & Plan Note (Signed)
Continues to struggle with low weight. Reports she is eating high calorie foods

## 2015-04-14 NOTE — Assessment & Plan Note (Signed)
chronic atrial fibrillation. Rate is well controlled   She is tolerating anticoagulation

## 2015-04-14 NOTE — Assessment & Plan Note (Signed)
Seems to be less of an issue on today's visit. Occasional flareup

## 2015-04-20 DIAGNOSIS — L570 Actinic keratosis: Secondary | ICD-10-CM | POA: Diagnosis not present

## 2015-04-24 ENCOUNTER — Ambulatory Visit (INDEPENDENT_AMBULATORY_CARE_PROVIDER_SITE_OTHER): Payer: Medicare Other | Admitting: *Deleted

## 2015-04-24 DIAGNOSIS — I482 Chronic atrial fibrillation: Secondary | ICD-10-CM | POA: Diagnosis not present

## 2015-04-24 LAB — POCT INR: INR: 2.32

## 2015-04-24 NOTE — Progress Notes (Signed)
Pre visit review using our clinic review tool, if applicable. No additional management support is needed unless otherwise documented below in the visit note. 

## 2015-05-01 ENCOUNTER — Other Ambulatory Visit: Payer: Self-pay | Admitting: Family Medicine

## 2015-05-25 ENCOUNTER — Ambulatory Visit (INDEPENDENT_AMBULATORY_CARE_PROVIDER_SITE_OTHER): Payer: Medicare Other | Admitting: *Deleted

## 2015-05-25 DIAGNOSIS — I482 Chronic atrial fibrillation: Secondary | ICD-10-CM | POA: Diagnosis not present

## 2015-05-25 LAB — POCT INR: INR: 2.02

## 2015-05-25 NOTE — Progress Notes (Signed)
Pre visit review using our clinic review tool, if applicable. No additional management support is needed unless otherwise documented below in the visit note. 

## 2015-06-22 ENCOUNTER — Ambulatory Visit (INDEPENDENT_AMBULATORY_CARE_PROVIDER_SITE_OTHER): Payer: PRIVATE HEALTH INSURANCE | Admitting: *Deleted

## 2015-06-22 DIAGNOSIS — I482 Chronic atrial fibrillation: Secondary | ICD-10-CM | POA: Diagnosis not present

## 2015-06-22 LAB — POCT INR: INR: 3.27

## 2015-06-22 NOTE — Progress Notes (Signed)
Pre visit review using our clinic review tool, if applicable. No additional management support is needed unless otherwise documented below in the visit note. 

## 2015-06-26 ENCOUNTER — Encounter: Payer: Self-pay | Admitting: Cardiovascular Disease

## 2015-06-26 ENCOUNTER — Encounter: Payer: Self-pay | Admitting: Family Medicine

## 2015-06-26 DIAGNOSIS — Z23 Encounter for immunization: Secondary | ICD-10-CM | POA: Diagnosis not present

## 2015-07-18 ENCOUNTER — Other Ambulatory Visit: Payer: Self-pay | Admitting: Family Medicine

## 2015-07-20 ENCOUNTER — Ambulatory Visit (INDEPENDENT_AMBULATORY_CARE_PROVIDER_SITE_OTHER): Payer: PRIVATE HEALTH INSURANCE | Admitting: *Deleted

## 2015-07-20 DIAGNOSIS — I482 Chronic atrial fibrillation: Secondary | ICD-10-CM | POA: Diagnosis not present

## 2015-07-20 LAB — POCT INR: INR: 2.49

## 2015-07-20 NOTE — Progress Notes (Signed)
Pre visit review using our clinic review tool, if applicable. No additional management support is needed unless otherwise documented below in the visit note. 

## 2015-08-17 ENCOUNTER — Ambulatory Visit (INDEPENDENT_AMBULATORY_CARE_PROVIDER_SITE_OTHER): Payer: PRIVATE HEALTH INSURANCE | Admitting: *Deleted

## 2015-08-17 DIAGNOSIS — I482 Chronic atrial fibrillation: Secondary | ICD-10-CM | POA: Diagnosis not present

## 2015-08-17 LAB — POCT INR: INR: 1.96

## 2015-08-17 NOTE — Progress Notes (Signed)
Pre visit review using our clinic review tool, if applicable. No additional management support is needed unless otherwise documented below in the visit note. 

## 2015-08-21 ENCOUNTER — Other Ambulatory Visit: Payer: Self-pay | Admitting: Family Medicine

## 2015-08-31 DIAGNOSIS — I482 Chronic atrial fibrillation: Secondary | ICD-10-CM | POA: Diagnosis not present

## 2015-08-31 LAB — POCT INR: INR: 3.18

## 2015-09-01 ENCOUNTER — Ambulatory Visit (INDEPENDENT_AMBULATORY_CARE_PROVIDER_SITE_OTHER): Payer: PRIVATE HEALTH INSURANCE | Admitting: *Deleted

## 2015-09-04 NOTE — Progress Notes (Signed)
Pre visit review using our clinic review tool, if applicable. No additional management support is needed unless otherwise documented below in the visit note. 

## 2015-09-14 ENCOUNTER — Ambulatory Visit (INDEPENDENT_AMBULATORY_CARE_PROVIDER_SITE_OTHER): Payer: PRIVATE HEALTH INSURANCE | Admitting: *Deleted

## 2015-09-14 DIAGNOSIS — I482 Chronic atrial fibrillation: Secondary | ICD-10-CM | POA: Diagnosis not present

## 2015-09-14 LAB — POCT INR: INR: 2.56

## 2015-09-14 NOTE — Progress Notes (Signed)
Pre visit review using our clinic review tool, if applicable. No additional management support is needed unless otherwise documented below in the visit note. 

## 2015-09-19 ENCOUNTER — Other Ambulatory Visit: Payer: Self-pay | Admitting: Family Medicine

## 2015-09-19 DIAGNOSIS — Z0000 Encounter for general adult medical examination without abnormal findings: Secondary | ICD-10-CM

## 2015-09-25 ENCOUNTER — Other Ambulatory Visit: Payer: Self-pay

## 2015-09-26 ENCOUNTER — Encounter: Payer: Self-pay | Admitting: Family Medicine

## 2015-09-27 DIAGNOSIS — L570 Actinic keratosis: Secondary | ICD-10-CM | POA: Diagnosis not present

## 2015-09-27 DIAGNOSIS — X32XXXA Exposure to sunlight, initial encounter: Secondary | ICD-10-CM | POA: Diagnosis not present

## 2015-09-27 DIAGNOSIS — Z85828 Personal history of other malignant neoplasm of skin: Secondary | ICD-10-CM | POA: Diagnosis not present

## 2015-09-27 DIAGNOSIS — L821 Other seborrheic keratosis: Secondary | ICD-10-CM | POA: Diagnosis not present

## 2015-09-28 ENCOUNTER — Encounter: Payer: Self-pay | Admitting: Family Medicine

## 2015-09-29 ENCOUNTER — Other Ambulatory Visit (INDEPENDENT_AMBULATORY_CARE_PROVIDER_SITE_OTHER): Payer: Medicare Other

## 2015-09-29 DIAGNOSIS — Z0000 Encounter for general adult medical examination without abnormal findings: Secondary | ICD-10-CM

## 2015-09-29 DIAGNOSIS — E785 Hyperlipidemia, unspecified: Secondary | ICD-10-CM

## 2015-09-29 DIAGNOSIS — E039 Hypothyroidism, unspecified: Secondary | ICD-10-CM | POA: Diagnosis not present

## 2015-09-29 LAB — COMPREHENSIVE METABOLIC PANEL
ALK PHOS: 70 U/L (ref 39–117)
ALT: 21 U/L (ref 0–35)
AST: 29 U/L (ref 0–37)
Albumin: 4.1 g/dL (ref 3.5–5.2)
BUN: 13 mg/dL (ref 6–23)
CO2: 30 meq/L (ref 19–32)
Calcium: 9.7 mg/dL (ref 8.4–10.5)
Chloride: 101 mEq/L (ref 96–112)
Creatinine, Ser: 0.71 mg/dL (ref 0.40–1.20)
GFR: 83.81 mL/min (ref >60.00)
GLUCOSE: 81 mg/dL (ref 70–99)
POTASSIUM: 3.9 meq/L (ref 3.5–5.1)
Sodium: 137 mEq/L (ref 135–145)
TOTAL PROTEIN: 7.1 g/dL (ref 6.0–8.3)
Total Bilirubin: 0.8 mg/dL (ref 0.2–1.2)

## 2015-09-29 LAB — LIPID PANEL
Cholesterol: 208 mg/dL — ABNORMAL HIGH (ref 0–200)
HDL: 67.8 mg/dL (ref >39.00)
LDL Cholesterol: 120 mg/dL — ABNORMAL HIGH (ref 0–99)
NONHDL: 140.69
TRIGLYCERIDES: 103 mg/dL (ref 0.0–149.0)
Total CHOL/HDL Ratio: 3
VLDL: 20.6 mg/dL (ref 0.0–40.0)

## 2015-09-29 LAB — TSH: TSH: 2.52 u[IU]/mL (ref 0.35–4.50)

## 2015-10-03 ENCOUNTER — Ambulatory Visit (INDEPENDENT_AMBULATORY_CARE_PROVIDER_SITE_OTHER): Payer: Medicare Other | Admitting: Family Medicine

## 2015-10-03 ENCOUNTER — Encounter: Payer: Self-pay | Admitting: Family Medicine

## 2015-10-03 VITALS — BP 124/76 | HR 80 | Temp 97.5°F | Ht 68.75 in | Wt 131.5 lb

## 2015-10-03 DIAGNOSIS — E039 Hypothyroidism, unspecified: Secondary | ICD-10-CM | POA: Diagnosis not present

## 2015-10-03 DIAGNOSIS — Z66 Do not resuscitate: Secondary | ICD-10-CM

## 2015-10-03 DIAGNOSIS — I4891 Unspecified atrial fibrillation: Secondary | ICD-10-CM

## 2015-10-03 DIAGNOSIS — Z0000 Encounter for general adult medical examination without abnormal findings: Secondary | ICD-10-CM

## 2015-10-03 DIAGNOSIS — E785 Hyperlipidemia, unspecified: Secondary | ICD-10-CM | POA: Diagnosis not present

## 2015-10-03 DIAGNOSIS — J439 Emphysema, unspecified: Secondary | ICD-10-CM | POA: Diagnosis not present

## 2015-10-03 NOTE — Progress Notes (Signed)
Pre visit review using our clinic review tool, if applicable. No additional management support is needed unless otherwise documented below in the visit note. 

## 2015-10-03 NOTE — Assessment & Plan Note (Signed)
Well controlled on current dose of synthroid. No changes made. 

## 2015-10-03 NOTE — Addendum Note (Signed)
Addended by: Lucille Passy on: 10/03/2015 01:40 PM   Modules accepted: Miquel Dunn

## 2015-10-03 NOTE — Assessment & Plan Note (Signed)
Improved from last year.  

## 2015-10-03 NOTE — Assessment & Plan Note (Signed)
Chronic afib Rate controlled On coumadin. Followed by Dr. Rockey Situ.

## 2015-10-03 NOTE — Assessment & Plan Note (Signed)
The patients weight, height, BMI and visual acuity have been recorded in the chart.  Cognitive function assessed.   I have made referrals, counseling and provided education to the patient based review of the above and I have provided the pt with a written personalized care plan for preventive services.  

## 2015-10-03 NOTE — Progress Notes (Addendum)
Subjective:    Patient ID: [name], female    DOB: 1931-11-23, 82 y.o.   MRN: [id number]  HPI  Very pleasant 82 yo female here for annual medicare wellness visit and follow up chronic medical conditions.  I have personally reviewed the Medicare Annual Wellness questionnaire and have noted 1. The patient's medical and social history 2. Their use of alcohol, tobacco or illicit drugs 3. Their current medications and supplements 4. The patient's functional ability including ADL's, fall risks, home safety risks and hearing or visual             impairment. 5. Diet and physical activities 6. Evidence for depression or mood disorders  End of life wishes discussed and updated in Social History.  The roster of all physicians providing medical care to patient - is listed in the Snapshot section of the chart.  Eye exam/12/16 Dental exam 10/24/14 Mammogram 07/15/13 Zostavax 08/06/07 DEXA 2013 Colonoscopy 10/31/14 Flu vaccine 06/26/15 Pneumovax 06/01/12 Prevnar 13 09/29/14  Remains very active.  Loves to walk.  Seeing Dr. Kellie Moor on 10/06/2015 for blue light treatment.  Weight has improved- did have some unintentional weight loss last year.  Colonoscopy was done by Dr. Rayann Heman in 10/2014.  Wt Readings from Last 3 Encounters:  10/03/15 131 lb 8 oz (59.648 kg)  04/14/15 129 lb 8 oz (58.741 kg)  09/26/14 128 lb 8 oz (58.287 kg)    HLD- much improved this year.  Lab Results  Component Value Date   CHOL 208* 09/29/2015   HDL 67.80 09/29/2015   LDLCALC 120* 09/29/2015   LDLDIRECT 126.5 07/08/2013   TRIG 103.0 09/29/2015   CHOLHDL 3 09/29/2015    Lab Results  Component Value Date   WBC 6.9 09/19/2014   HGB 16.5* 09/19/2014   HCT 51.1* 09/19/2014   MCV 93.7 09/19/2014   PLT 171.0 09/19/2014   Lab Results  Component Value Date   TSH 2.52 09/29/2015   Lab Results  Component Value Date   NA 137 09/29/2015   K 3.9 09/29/2015   CL 101 09/29/2015   CO2 30 09/29/2015   Lab  Results  Component Value Date   CREATININE 0.71 09/29/2015     Afib-   Diagnosed during pre op clearance for cataracts surgery in 2010.  Has been on digoxin and coumadin since.   Lab Results  Component Value Date   INR 2.56 09/14/2015   INR 3.18 08/31/2015   INR 1.96 08/17/2015   Denies any CP or palpitations.  Saw Dr. Rockey Situ on 03/2915.  Note reviewed. No changes were made to her rxs.  Hypothyroidism- .  Has been on same dose of synthroid for "as long as she can remember." Denies any symptoms of hypo or hyperthyroidism. Lab Results  Component Value Date   TSH 2.52 09/29/2015   Lab Results  Component Value Date   CREATININE 0.71 09/29/2015       Patient Active Problem List   Diagnosis Date Noted  . Medicare annual wellness visit, subsequent 09/22/2014  . HLD (hyperlipidemia) 09/22/2014  . DNR (do not resuscitate) 04/13/2013  . Atrial fibrillation ([place])   . COPD (chronic obstructive pulmonary disease) ([place])   . Hypothyroidism    Past Medical History  Diagnosis Date  . Atrial fibrillation ([place])   . COPD (chronic obstructive pulmonary disease) ([place])   . Hypothyroidism   . Osteoporosis   . Syncope   . Squamous cell carcinoma ([place])   . Microscopic colitis    Past Surgical  History  Procedure Laterality Date  . Appendectomy    . Lipoma excision    . Cataract extraction Bilateral   . Colonoscopy    . Multiple tooth extractions     Social History  Substance Use Topics  . Smoking status: Former Smoker -- 1.00 packs/day for 40 years    Types: Cigarettes  . Smokeless tobacco: None  . Alcohol Use: Yes     Comment: social   Family History  Problem Relation Age of Onset  . Alzheimer's disease Mother   . Cancer Brother 55    bladder  . Brain cancer Father    Allergies  Allergen Reactions  . Sulfa Antibiotics Nausea And Vomiting   Current Outpatient Prescriptions on File Prior to Visit  Medication Sig Dispense Refill  . ADVAIR DISKUS 100-50 MCG/DOSE AEPB  INHALE 1 PUFF BY MOUTH TWICE DAILY 60 each 5  . Cholecalciferol (VITAMIN D-3) 1000 UNITS CAPS Take 1,000 Units by mouth every other day.     Marland Kitchen DIGOX 125 MCG tablet TAKE 1 TABLET BY MOUTH EVERY DAY 90 tablet 3  . diphenhydrAMINE (BENADRYL) 25 MG tablet Take 25 mg by mouth as needed for sleep.     . Lactobacillus (ACIDOPHILUS PO) Take by mouth.    . levothyroxine (SYNTHROID, LEVOTHROID) 50 MCG tablet TAKE 1 TABLET BY MOUTH EVERY OTHER DAY AND ALTERNATE WITH 1/2 TABLET 90 tablet 3  . Melatonin 1 MG TABS Take by mouth.    . traZODone (DESYREL) 50 MG tablet TAKE 1 TABLET BY MOUTH EVERY EVENING AS NEEDED FOR INSOMNIA 90 tablet 3  . verapamil (CALAN-SR) 120 MG CR tablet Take 1 tablet (120 mg total) by mouth daily. 90 tablet 3  . warfarin (COUMADIN) 5 MG tablet TAKE AS DIRECTED BY ANTI-COAGULATION CLINIC 120 tablet 0   No current facility-administered medications on file prior to visit.   The PMH, PSH, Social History, Family History, Medications, and allergies have been reviewed in [facility], and have been updated if relevant.  Review of Systems  Constitutional: Negative.   HENT: Negative.   Respiratory: Negative.   Cardiovascular: Negative.   Gastrointestinal: Negative.   Endocrine: Negative.   Genitourinary: Negative.   Musculoskeletal: Negative.   Skin: Negative.   Allergic/Immunologic: Negative.   Neurological: Negative.   Hematological: Negative.   Psychiatric/Behavioral: Negative.   All other systems reviewed and are negative.       Objective:   Physical Exam BP 124/76 mmHg  Pulse 80  Temp(Src) 97.5 F (36.4 C) (Oral)  Ht 5' 8.75" (1.746 m)  Wt 131 lb 8 oz (59.648 kg)  BMI 19.57 kg/m2  SpO2 88%   General:  Well-developed,well-nourished,in no acute distress; alert,appropriate and cooperative throughout examination Head:  normocephalic and atraumatic.   Eyes:  vision grossly intact, pupils equal, pupils round, and pupils reactive to light.   Ears:  R ear normal and L ear  normal.   Nose:  no external deformity.   Mouth:  good dentition.   Neck:  No deformities, masses, or tenderness noted. Breasts:  No mass, nodules, thickening, tenderness, bulging, retraction, inflamation, nipple discharge or skin changes noted.   Lungs:  Normal respiratory effort, chest expands symmetrically. Lungs are clear to auscultation, no crackles or wheezes. Heart:  irregularly irregular Abdomen:  Bowel sounds positive,abdomen soft and non-tender without masses, organomegaly or hernias noted. Msk:  No deformity or scoliosis noted of thoracic or lumbar spine.   Extremities:  No clubbing, cyanosis, edema, or deformity noted with normal full range  of motion of all joints.   Neurologic:  alert & oriented X3 and gait normal.   Skin:  Intact without suspicious lesions or rashes Cervical Nodes:  No lymphadenopathy noted Axillary Nodes:  No palpable lymphadenopathy Psych:  Cognition and judgment appear intact. Alert and cooperative with normal attention span and concentration. No apparent delusions, illusions, hallucinations     Assessment & Plan:

## 2015-10-11 DIAGNOSIS — L570 Actinic keratosis: Secondary | ICD-10-CM | POA: Diagnosis not present

## 2015-10-11 DIAGNOSIS — X32XXXA Exposure to sunlight, initial encounter: Secondary | ICD-10-CM | POA: Diagnosis not present

## 2015-10-12 ENCOUNTER — Ambulatory Visit (INDEPENDENT_AMBULATORY_CARE_PROVIDER_SITE_OTHER): Payer: PRIVATE HEALTH INSURANCE | Admitting: *Deleted

## 2015-10-12 DIAGNOSIS — I482 Chronic atrial fibrillation: Secondary | ICD-10-CM | POA: Diagnosis not present

## 2015-10-12 LAB — POCT INR: INR: 2.76

## 2015-10-12 NOTE — Progress Notes (Signed)
Pre visit review using our clinic review tool, if applicable. No additional management support is needed unless otherwise documented below in the visit note. 

## 2015-10-26 DIAGNOSIS — H40003 Preglaucoma, unspecified, bilateral: Secondary | ICD-10-CM | POA: Diagnosis not present

## 2015-10-26 DIAGNOSIS — Z961 Presence of intraocular lens: Secondary | ICD-10-CM | POA: Diagnosis not present

## 2015-11-09 ENCOUNTER — Ambulatory Visit (INDEPENDENT_AMBULATORY_CARE_PROVIDER_SITE_OTHER): Payer: PRIVATE HEALTH INSURANCE | Admitting: *Deleted

## 2015-11-09 DIAGNOSIS — I482 Chronic atrial fibrillation: Secondary | ICD-10-CM | POA: Diagnosis not present

## 2015-11-09 LAB — POCT INR: INR: 2.09

## 2015-11-09 NOTE — Progress Notes (Signed)
Pre visit review using our clinic review tool, if applicable. No additional management support is needed unless otherwise documented below in the visit note. 

## 2015-11-30 DIAGNOSIS — H26492 Other secondary cataract, left eye: Secondary | ICD-10-CM | POA: Diagnosis not present

## 2015-12-07 ENCOUNTER — Encounter: Payer: Self-pay | Admitting: Family Medicine

## 2015-12-07 ENCOUNTER — Other Ambulatory Visit: Payer: Self-pay | Admitting: Family Medicine

## 2015-12-07 ENCOUNTER — Ambulatory Visit (INDEPENDENT_AMBULATORY_CARE_PROVIDER_SITE_OTHER): Payer: PRIVATE HEALTH INSURANCE | Admitting: *Deleted

## 2015-12-07 DIAGNOSIS — I482 Chronic atrial fibrillation: Secondary | ICD-10-CM | POA: Diagnosis not present

## 2015-12-07 LAB — POCT INR: INR: 2.43

## 2015-12-07 NOTE — Progress Notes (Signed)
Pre visit review using our clinic review tool, if applicable. No additional management support is needed unless otherwise documented below in the visit note. 

## 2015-12-07 NOTE — Telephone Encounter (Signed)
i am not familiar with anything she has mentioned below. Will await form from pharmacy

## 2016-01-03 ENCOUNTER — Telehealth: Payer: Self-pay | Admitting: Family Medicine

## 2016-01-03 NOTE — Telephone Encounter (Signed)
Left message for pt to call if she has any question regarding the [place] letter she received about Dante (ACO) and the AWV that would allow her to recieve compensation for completion of her AWV.

## 2016-01-04 DIAGNOSIS — I482 Chronic atrial fibrillation: Secondary | ICD-10-CM | POA: Diagnosis not present

## 2016-01-05 ENCOUNTER — Ambulatory Visit (INDEPENDENT_AMBULATORY_CARE_PROVIDER_SITE_OTHER): Payer: PRIVATE HEALTH INSURANCE | Admitting: *Deleted

## 2016-01-05 LAB — POCT INR: INR: 3.12

## 2016-01-05 NOTE — Progress Notes (Signed)
Pre visit review using our clinic review tool, if applicable. No additional management support is needed unless otherwise documented below in the visit note. 

## 2016-02-01 DIAGNOSIS — I482 Chronic atrial fibrillation: Secondary | ICD-10-CM | POA: Diagnosis not present

## 2016-02-01 LAB — POCT INR: INR: 2.55

## 2016-02-06 ENCOUNTER — Ambulatory Visit (INDEPENDENT_AMBULATORY_CARE_PROVIDER_SITE_OTHER): Payer: PRIVATE HEALTH INSURANCE | Admitting: *Deleted

## 2016-02-06 ENCOUNTER — Other Ambulatory Visit: Payer: Self-pay | Admitting: Cardiovascular Disease

## 2016-02-06 NOTE — Progress Notes (Signed)
Pre visit review using our clinic review tool, if applicable. No additional management support is needed unless otherwise documented below in the visit note. 

## 2016-02-29 DIAGNOSIS — I482 Chronic atrial fibrillation: Secondary | ICD-10-CM | POA: Diagnosis not present

## 2016-03-05 ENCOUNTER — Other Ambulatory Visit: Payer: Self-pay | Admitting: Cardiovascular Disease

## 2016-03-28 ENCOUNTER — Other Ambulatory Visit: Payer: Self-pay | Admitting: Family Medicine

## 2016-03-28 ENCOUNTER — Encounter: Payer: Self-pay | Admitting: Family Medicine

## 2016-03-28 DIAGNOSIS — I482 Chronic atrial fibrillation: Secondary | ICD-10-CM | POA: Diagnosis not present

## 2016-03-28 LAB — PROTIME-INR

## 2016-04-10 ENCOUNTER — Encounter: Payer: Self-pay | Admitting: Cardiovascular Disease

## 2016-04-10 ENCOUNTER — Ambulatory Visit (INDEPENDENT_AMBULATORY_CARE_PROVIDER_SITE_OTHER): Payer: Medicare Other | Admitting: Cardiovascular Disease

## 2016-04-10 VITALS — BP 104/80 | HR 86 | Ht 68.0 in | Wt 133.0 lb

## 2016-04-10 DIAGNOSIS — I4891 Unspecified atrial fibrillation: Secondary | ICD-10-CM | POA: Diagnosis not present

## 2016-04-10 DIAGNOSIS — E785 Hyperlipidemia, unspecified: Secondary | ICD-10-CM

## 2016-04-10 DIAGNOSIS — J432 Centrilobular emphysema: Secondary | ICD-10-CM

## 2016-04-10 DIAGNOSIS — M79673 Pain in unspecified foot: Secondary | ICD-10-CM

## 2016-04-10 NOTE — Patient Instructions (Signed)
Medication Instructions:   No medication changes   Follow-Up: It was a pleasure seeing you in the office today. Please call us if you have new issues that need to be addressed before your next appt.  [phone]  Your physician wants you to follow-up in: 12 months.  You will receive a reminder letter in the mail two months in advance. If you don't receive a letter, please call our office to schedule the follow-up appointment.  If you need a refill on your cardiac medications before your next appointment, please call your pharmacy.

## 2016-04-10 NOTE — Progress Notes (Signed)
Cardiology Office Note  Date:  04/10/2016   ID:  [name], DOB December 21, 1931, MRN [id number]  PCP:  Arnette Norris, MD   Chief Complaint  Patient presents with  . Other    1 yr f/u. Meds reviewed verbally with pt.    HPI:  [name] is a 82 year old woman with chronic atrial fibrillation, COPD, 40 years of smoking, on warfarin and rate control medications who presents for routine followup of her atrial fibrillation. She lives at [facility].  In follow-up today, she reports that she is doing relatively well Foot problems, pain in feet, with shoes, leg pain Less active 2 to 3 days a week goes to do exercise  Problems with vertigo, balance issues, legs weak Seeing [name], Dr. Pryor Ochoa for vertigo  SOB with hills and stairs, okay on flat surfaces Drinking almond milk, feels it may have helped her cholesterol Down from 270 to 208  EKG on today's visit shows atrial fibrillation with ventricular rate 86 bpm, no significant ST or T-wave changes  Other past medical history Prior cardiac notes from Dr. Sheppard Coil in [place] indicate she has chronic atrial fibrillation,  echocardiogram in 2010 and March 2014 confirming atrial fibrillation. No old EKGs are available.  Echocardiogram from March 2014 shows normal LV systolic function, severely dilated left and right atrium, mildly elevated right ventricular systolic pressure estimated at 35 mm of mercury  PMH:   has a past medical history of Atrial fibrillation ([place]); COPD (chronic obstructive pulmonary disease) ([place]); Hypothyroidism; Microscopic colitis; Osteoporosis; Squamous cell carcinoma ([place]); and Syncope.  PSH:    Past Surgical History:  Procedure Laterality Date  . APPENDECTOMY    . CATARACT EXTRACTION Bilateral   . COLONOSCOPY    . LIPOMA EXCISION    . MULTIPLE TOOTH EXTRACTIONS    . REFRACTIVE SURGERY      Current Outpatient Prescriptions  Medication Sig Dispense Refill  . ADVAIR DISKUS 100-50 MCG/DOSE AEPB INHALE 1 PUFF BY  MOUTH TWICE DAILY 60 each 5  . Cholecalciferol (VITAMIN D-3) 1000 UNITS CAPS Take 1,000 Units by mouth every other day.     Marland Kitchen DIGOX 125 MCG tablet TAKE 1 TABLET BY MOUTH EVERY DAY 90 tablet 0  . diphenhydrAMINE (BENADRYL) 25 MG tablet Take 25 mg by mouth as needed for sleep.     . Lactobacillus (ACIDOPHILUS PO) Take by mouth.    . levothyroxine (SYNTHROID, LEVOTHROID) 50 MCG tablet TAKE 1 TABLET BY MOUTH EVERY OTHER DAY AND ALTERNATE WITH 1/2 TABLET 90 tablet 0  . Melatonin 1 MG TABS Take by mouth.    . traZODone (DESYREL) 50 MG tablet TAKE 1 TABLET BY MOUTH EVERY EVENING AS NEEDED FOR INSOMNIA 90 tablet 3  . verapamil (CALAN-SR) 120 MG CR tablet TAKE 1 TABLET(120 MG) BY MOUTH DAILY 90 tablet 0  . warfarin (COUMADIN) 5 MG tablet TAKE AS DIRECTED BY ANTI-COAGULATION CLINIC 360 tablet 0   No current facility-administered medications for this visit.      Allergies:   Sulfa antibiotics   Social History:  The patient  reports that she has quit smoking. Her smoking use included Cigarettes. She has a 40.00 pack-year smoking history. She does not have any smokeless tobacco history on file. She reports that she drinks alcohol. She reports that she does not use drugs.   Family History:   family history includes Alzheimer's disease in her mother; Brain cancer in her father; Cancer (age of onset: 52) in her brother.    Review of Systems:  Review of Systems  Respiratory: Negative.   Cardiovascular: Negative.   Gastrointestinal: Negative.   Musculoskeletal: Negative.        Poor balance, pain in her feet  Neurological: Positive for dizziness and weakness.  Endo/Heme/Allergies: Bruises/bleeds easily.  Psychiatric/Behavioral: Negative.   All other systems reviewed and are negative.    PHYSICAL EXAM: VS:  BP 104/80 (BP Location: Left Arm, Patient Position: Sitting, Cuff Size: Normal)   Pulse 86   Ht 5\' 8"  (1.727 m)   Wt 133 lb (60.3 kg)   BMI 20.22 kg/m  , BMI Body mass index is 20.22  kg/m. GEN: Well nourished, well developed, in no acute distress  HEENT: normal  Neck: no JVD, carotid bruits, or masses Cardiac: irregular irregular,  no murmurs, rubs, or gallops,no edema  Respiratory:  clear to auscultation bilaterally, normal work of breathing GI: soft, nontender, nondistended, + BS MS: no deformity or atrophy  Skin: warm and dry, no rash, bruising in LE b/l Neuro:  Strength and sensation are intact Psych: euthymic mood, full affect    Recent Labs: 09/29/2015: ALT 21; BUN 13; Creatinine, Ser 0.71; Potassium 3.9; Sodium 137; TSH 2.52    Lipid Panel Lab Results  Component Value Date   CHOL 208 (H) 09/29/2015   HDL 67.80 09/29/2015   LDLCALC 120 (H) 09/29/2015   TRIG 103.0 09/29/2015      Wt Readings from Last 3 Encounters:  04/10/16 133 lb (60.3 kg)  10/03/15 131 lb 8 oz (59.6 kg)  04/14/15 129 lb 8 oz (58.7 kg)       ASSESSMENT AND PLAN:  Atrial fibrillation, unspecified type ([place]) - Plan: EKG 12-Lead Rate controlled , on anticoagulation Again is surprised that she is still in atrial fibrillation  HLD (hyperlipidemia) 270 down to 208 in 09/2015 Weight up 4 pounds in one year Does not want a statin  Centrilobular emphysema (HCC) SOB with hills and stairs  Leg pain Significant problems with her feet, recommended she see podiatry   Total encounter time more than 25 minutes  Greater than 50% was spent in counseling and coordination of care with the patient   Disposition:   F/U  12 months   Orders Placed This Encounter  Procedures  . EKG 12-Lead     Signed, Esmond Plants, M.D., Ph.D. 04/10/2016  [facility], [place]

## 2016-04-11 ENCOUNTER — Encounter: Payer: Self-pay | Admitting: Cardiovascular Disease

## 2016-04-19 DIAGNOSIS — R42 Dizziness and giddiness: Secondary | ICD-10-CM | POA: Diagnosis not present

## 2016-04-19 DIAGNOSIS — H903 Sensorineural hearing loss, bilateral: Secondary | ICD-10-CM | POA: Diagnosis not present

## 2016-04-25 DIAGNOSIS — I482 Chronic atrial fibrillation: Secondary | ICD-10-CM | POA: Diagnosis not present

## 2016-04-26 ENCOUNTER — Telehealth: Payer: Self-pay

## 2016-04-26 NOTE — Telephone Encounter (Signed)
Goal is 2-3 Have her hold x 1 day--then restart at 5mg  daily except 7.5mg  once a week Recheck 1 month

## 2016-04-26 NOTE — Telephone Encounter (Signed)
Sharee Pimple at [facility] left v/m with called report for PT/INR. PT is 33.9 and INR is 3.4; pt presently taking Coumadin 5 mg daily except taking 7.5 mg on Mon. And Thur. Sharee Pimple request cb. Dr Deborra Medina out of office and Earl Lagos is not sure when Dr Deborra Medina will be back on computers. Will send note to Dr Silvio Pate.

## 2016-04-26 NOTE — Telephone Encounter (Signed)
Left message to call office

## 2016-04-26 NOTE — Telephone Encounter (Signed)
Spoke to [place] at [facility] with verbal orders for PT/INR

## 2016-05-02 ENCOUNTER — Other Ambulatory Visit: Payer: Self-pay | Admitting: Cardiovascular Disease

## 2016-05-02 ENCOUNTER — Other Ambulatory Visit: Payer: Self-pay | Admitting: Family Medicine

## 2016-05-23 ENCOUNTER — Encounter: Payer: Self-pay | Admitting: Family Medicine

## 2016-05-23 DIAGNOSIS — I482 Chronic atrial fibrillation: Secondary | ICD-10-CM | POA: Diagnosis not present

## 2016-05-23 LAB — PROTIME-INR

## 2016-06-06 ENCOUNTER — Other Ambulatory Visit: Payer: Self-pay | Admitting: Cardiovascular Disease

## 2016-06-19 DIAGNOSIS — R42 Dizziness and giddiness: Secondary | ICD-10-CM | POA: Diagnosis not present

## 2016-06-19 DIAGNOSIS — H903 Sensorineural hearing loss, bilateral: Secondary | ICD-10-CM | POA: Diagnosis not present

## 2016-06-20 ENCOUNTER — Encounter: Payer: Self-pay | Admitting: Family Medicine

## 2016-06-20 DIAGNOSIS — I482 Chronic atrial fibrillation: Secondary | ICD-10-CM | POA: Diagnosis not present

## 2016-06-20 LAB — PROTIME-INR

## 2016-06-25 DIAGNOSIS — Z23 Encounter for immunization: Secondary | ICD-10-CM | POA: Diagnosis not present

## 2016-06-27 ENCOUNTER — Encounter: Payer: Self-pay | Admitting: Cardiovascular Disease

## 2016-06-27 ENCOUNTER — Encounter: Payer: Self-pay | Admitting: Family Medicine

## 2016-06-27 DIAGNOSIS — I482 Chronic atrial fibrillation: Secondary | ICD-10-CM | POA: Diagnosis not present

## 2016-06-29 ENCOUNTER — Other Ambulatory Visit: Payer: Self-pay | Admitting: Family Medicine

## 2016-07-04 DIAGNOSIS — I482 Chronic atrial fibrillation: Secondary | ICD-10-CM | POA: Diagnosis not present

## 2016-07-18 ENCOUNTER — Encounter: Payer: Self-pay | Admitting: Family Medicine

## 2016-07-18 DIAGNOSIS — I482 Chronic atrial fibrillation: Secondary | ICD-10-CM | POA: Diagnosis not present

## 2016-07-18 LAB — PROTIME-INR

## 2016-07-25 ENCOUNTER — Encounter: Payer: Self-pay | Admitting: Family Medicine

## 2016-07-25 DIAGNOSIS — I482 Chronic atrial fibrillation: Secondary | ICD-10-CM | POA: Diagnosis not present

## 2016-07-25 LAB — PROTIME-INR

## 2016-08-12 ENCOUNTER — Other Ambulatory Visit: Payer: Self-pay | Admitting: Family Medicine

## 2016-08-14 DIAGNOSIS — H40003 Preglaucoma, unspecified, bilateral: Secondary | ICD-10-CM | POA: Diagnosis not present

## 2016-08-22 ENCOUNTER — Ambulatory Visit (INDEPENDENT_AMBULATORY_CARE_PROVIDER_SITE_OTHER): Payer: PRIVATE HEALTH INSURANCE

## 2016-08-22 DIAGNOSIS — I482 Chronic atrial fibrillation: Secondary | ICD-10-CM | POA: Diagnosis not present

## 2016-08-22 LAB — POCT INR: INR: 2.4

## 2016-08-22 NOTE — Patient Instructions (Signed)
Pre visit review using our clinic review tool, if applicable. No additional management support is needed unless otherwise documented below in the visit note. 

## 2016-08-28 ENCOUNTER — Other Ambulatory Visit: Payer: Self-pay | Admitting: Family Medicine

## 2016-09-19 ENCOUNTER — Ambulatory Visit (INDEPENDENT_AMBULATORY_CARE_PROVIDER_SITE_OTHER): Payer: PRIVATE HEALTH INSURANCE

## 2016-09-19 DIAGNOSIS — I482 Chronic atrial fibrillation: Secondary | ICD-10-CM | POA: Diagnosis not present

## 2016-09-19 LAB — POCT INR: INR: 2.5

## 2016-09-19 NOTE — Patient Instructions (Signed)
Pre visit review using our clinic review tool, if applicable. No additional management support is needed unless otherwise documented below in the visit note. 

## 2016-09-26 DIAGNOSIS — I872 Venous insufficiency (chronic) (peripheral): Secondary | ICD-10-CM | POA: Diagnosis not present

## 2016-09-26 DIAGNOSIS — X32XXXA Exposure to sunlight, initial encounter: Secondary | ICD-10-CM | POA: Diagnosis not present

## 2016-09-26 DIAGNOSIS — Z08 Encounter for follow-up examination after completed treatment for malignant neoplasm: Secondary | ICD-10-CM | POA: Diagnosis not present

## 2016-09-26 DIAGNOSIS — Z85828 Personal history of other malignant neoplasm of skin: Secondary | ICD-10-CM | POA: Diagnosis not present

## 2016-09-26 DIAGNOSIS — L821 Other seborrheic keratosis: Secondary | ICD-10-CM | POA: Diagnosis not present

## 2016-09-26 DIAGNOSIS — L570 Actinic keratosis: Secondary | ICD-10-CM | POA: Diagnosis not present

## 2016-10-01 ENCOUNTER — Encounter: Payer: Self-pay | Admitting: Family Medicine

## 2016-10-07 ENCOUNTER — Ambulatory Visit (INDEPENDENT_AMBULATORY_CARE_PROVIDER_SITE_OTHER): Payer: Medicare Other

## 2016-10-07 VITALS — BP 100/72 | HR 79 | Temp 97.6°F | Ht 68.25 in | Wt 134.8 lb

## 2016-10-07 DIAGNOSIS — E784 Other hyperlipidemia: Secondary | ICD-10-CM

## 2016-10-07 DIAGNOSIS — Z79899 Other long term (current) drug therapy: Secondary | ICD-10-CM

## 2016-10-07 DIAGNOSIS — Z0000 Encounter for general adult medical examination without abnormal findings: Secondary | ICD-10-CM | POA: Diagnosis not present

## 2016-10-07 DIAGNOSIS — E038 Other specified hypothyroidism: Secondary | ICD-10-CM

## 2016-10-07 DIAGNOSIS — E7849 Other hyperlipidemia: Secondary | ICD-10-CM

## 2016-10-07 DIAGNOSIS — Z5181 Encounter for therapeutic drug level monitoring: Secondary | ICD-10-CM

## 2016-10-07 DIAGNOSIS — R7989 Other specified abnormal findings of blood chemistry: Secondary | ICD-10-CM | POA: Diagnosis not present

## 2016-10-07 LAB — LIPID PANEL
Cholesterol: 233 mg/dL — ABNORMAL HIGH (ref 0–200)
HDL: 75.9 mg/dL (ref >39.00)
LDL Cholesterol: 140 mg/dL — ABNORMAL HIGH (ref 0–99)
NONHDL: 157.23
Total CHOL/HDL Ratio: 3
Triglycerides: 85 mg/dL (ref 0.0–149.0)
VLDL: 17 mg/dL (ref 0.0–40.0)

## 2016-10-07 LAB — COMPREHENSIVE METABOLIC PANEL
ALBUMIN: 4.3 g/dL (ref 3.5–5.2)
ALK PHOS: 79 U/L (ref 39–117)
ALT: 25 U/L (ref 0–35)
AST: 31 U/L (ref 0–37)
BILIRUBIN TOTAL: 0.6 mg/dL (ref 0.2–1.2)
BUN: 12 mg/dL (ref 6–23)
CO2: 30 mEq/L (ref 19–32)
Calcium: 10 mg/dL (ref 8.4–10.5)
Chloride: 102 mEq/L (ref 96–112)
Creatinine, Ser: 0.69 mg/dL (ref 0.40–1.20)
GFR: 86.4 mL/min (ref >60.00)
GLUCOSE: 94 mg/dL (ref 70–99)
Potassium: 4.6 mEq/L (ref 3.5–5.1)
Sodium: 138 mEq/L (ref 135–145)
Total Protein: 7.5 g/dL (ref 6.0–8.3)

## 2016-10-07 LAB — CBC WITH DIFFERENTIAL/PLATELET
BASOS PCT: 0.6 % (ref 0.0–3.0)
Basophils Absolute: 0 10*3/uL (ref 0.0–0.1)
EOS ABS: 0.1 10*3/uL (ref 0.0–0.7)
Eosinophils Relative: 1.5 % (ref 0.0–5.0)
HEMATOCRIT: 45.2 % (ref 36.0–46.0)
Hemoglobin: 15 g/dL (ref 12.0–15.0)
Lymphocytes Relative: 28.1 % (ref 12.0–46.0)
Lymphs Abs: 1.9 10*3/uL (ref 0.7–4.0)
MCHC: 33.3 g/dL (ref 30.0–36.0)
MCV: 92.2 fl (ref 78.0–100.0)
MONO ABS: 0.4 10*3/uL (ref 0.1–1.0)
Monocytes Relative: 6.5 % (ref 3.0–12.0)
NEUTROS ABS: 4.3 10*3/uL (ref 1.4–7.7)
Neutrophils Relative %: 63.3 % (ref 43.0–77.0)
PLATELETS: 201 10*3/uL (ref 150.0–400.0)
RBC: 4.9 Mil/uL (ref 3.87–5.11)
RDW: 14.2 % (ref 11.5–15.5)
WBC: 6.8 10*3/uL (ref 4.0–10.5)

## 2016-10-07 LAB — TSH: TSH: 1.68 u[IU]/mL (ref 0.35–4.50)

## 2016-10-07 NOTE — Progress Notes (Signed)
Subjective:   [name] is a 83 y.o. female who presents for Medicare Annual (Subsequent) preventive examination.  Review of Systems:  N/A Cardiac Risk Factors include: advanced age (>35men, >60 women);dyslipidemia     Objective:     Vitals: BP 100/72 (BP Location: Right Arm, Patient Position: Sitting, Cuff Size: Normal)   Pulse 79   Temp 97.6 F (36.4 C) (Oral)   Ht 5' 8.25" (1.734 m) Comment: no shoes  Wt 134 lb 12 oz (61.1 kg)   SpO2 92%   BMI 20.34 kg/m   Body mass index is 20.34 kg/m.   Tobacco History  Smoking Status  . Former Smoker  . Packs/day: 1.00  . Years: 40.00  . Types: Cigarettes  Smokeless Tobacco  . Never Used     Counseling given: No   Past Medical History:  Diagnosis Date  . Atrial fibrillation ([place])   . COPD (chronic obstructive pulmonary disease) ([place])   . Hypothyroidism   . Microscopic colitis   . Osteoporosis   . Squamous cell carcinoma   . Syncope    Past Surgical History:  Procedure Laterality Date  . APPENDECTOMY    . CATARACT EXTRACTION Bilateral   . COLONOSCOPY    . LIPOMA EXCISION    . MULTIPLE TOOTH EXTRACTIONS    . REFRACTIVE SURGERY     Family History  Problem Relation Age of Onset  . Alzheimer's disease Mother   . Brain cancer Father   . Cancer Brother 7    bladder   History  Sexual Activity  . Sexual activity: No    Outpatient Encounter Prescriptions as of 10/07/2016  Medication Sig  . ADVAIR DISKUS 100-50 MCG/DOSE AEPB INHALE 1 PUFF BY MOUTH TWICE DAILY  . Cholecalciferol (VITAMIN D-3) 1000 UNITS CAPS Take 1,000 Units by mouth every other day.   Marland Kitchen DIGOX 125 MCG tablet TAKE 1 TABLET BY MOUTH EVERY DAY  . Lactobacillus (ACIDOPHILUS PO) Take by mouth.  . levothyroxine (SYNTHROID, LEVOTHROID) 50 MCG tablet TAKE 1 TABLET BY MOUTH EVERY OTHER DAY AND ALTERNATE WITH 1/2 TABLET  . verapamil (CALAN-SR) 120 MG CR tablet TAKE 1 TABLET(120 MG) BY MOUTH DAILY  . warfarin (COUMADIN) 5 MG tablet TAKE AS DIRECTED  BY ANTI-COAGULATION CLINIC  . [DISCONTINUED] Melatonin 1 MG TABS Take by mouth.  . [DISCONTINUED] traZODone (DESYREL) 50 MG tablet TAKE 1 TABLET BY MOUTH EVERY EVENING AS NEEDED FOR INSOMNIA  . [DISCONTINUED] diphenhydrAMINE (BENADRYL) 25 MG tablet Take 25 mg by mouth as needed for sleep.    No facility-administered encounter medications on file as of 10/07/2016.     Activities of Daily Living In your present state of health, do you have any difficulty performing the following activities: 10/07/2016  Hearing? Y  Vision? N  Difficulty concentrating or making decisions? Y  Walking or climbing stairs? Y  Dressing or bathing? N  Doing errands, shopping? N  Preparing Food and eating ? N  Using the Toilet? N  In the past six months, have you accidently leaked urine? N  Do you have problems with loss of bowel control? N  Managing your Medications? N  Managing your Finances? N  Housekeeping or managing your Housekeeping? N  Some recent data might be hidden    Patient Care Team: Lucille Passy, MD as PCP - General (Family Medicine) Oneta Rack, MD (Dermatology) Carloyn Manner, MD as Referring Physician (Otolaryngology) Estill Cotta, MD as Consulting Physician (Ophthalmology) Minna Merritts, MD as Consulting Physician (Cardiology)  Josefine Class, MD as Referring Physician (Gastroenterology) Oneta Rack, MD (Dermatology)    Assessment:     Hearing Screening   125Hz  250Hz  500Hz  1000Hz  2000Hz  3000Hz  4000Hz  6000Hz  8000Hz   Right ear:   0 0 0  0    Left ear:   0 0 40  40    Vision Screening Comments: Last vision exam in November 2017 with Dr. Sandra Cockayne   Exercise Activities and Dietary recommendations Current Exercise Habits: Home exercise routine, Type of exercise: yoga;walking, Time (Minutes): 60, Frequency (Times/Week): 3, Weekly Exercise (Minutes/Week): 180, Exercise limited by: None identified  Goals    . Increase physical activity          Starting  10/07/16, I will continue to do yoga and stretching at least 60 min 3 days a week.       Fall Risk Fall Risk  10/07/2016 10/03/2015 09/22/2014 06/22/2013  Falls in the past year? No No No No   Depression Screen PHQ 2/9 Scores 10/07/2016 10/03/2015 09/22/2014 06/22/2013  PHQ - 2 Score 0 0 0 0     Cognitive Function MMSE - Mini Mental State Exam 10/07/2016  Orientation to time 5  Orientation to Place 5  Registration 3  Attention/ Calculation 0  Recall 3  Language- name 2 objects 0  Language- repeat 1  Language- follow 3 step command 3  Language- read & follow direction 0  Write a sentence 0  Copy design 0  Total score 20     PLEASE NOTE: A Mini-Cog screen was completed. Maximum score is 20. A value of 0 denotes this part of [place] MMSE was not completed or the patient failed this part of the Mini-Cog screening.   Mini-Cog Screening Orientation to Time - Max 5 pts Orientation to Place - Max 5 pts Registration - Max 3 pts Recall - Max 3 pts Language Repeat - Max 1 pts Language Follow 3 Step Command - Max 3 pts     Immunization History  Administered Date(s) Administered  . DTaP 04/30/2004  . Influenza, High Dose Seasonal PF 06/17/2014  . Influenza,inj,Quad PF,36+ Mos 06/22/2013  . Influenza-Unspecified 06/26/2015  . Pneumococcal Conjugate-13 09/29/2014  . Pneumococcal Polysaccharide-23 06/01/2012  . Varicella 08/06/2007   Screening Tests Health Maintenance  Topic Date Due  . TETANUS/TDAP  04/29/2024 (Originally 04/30/2014)  . INFLUENZA VACCINE  Completed  . DEXA SCAN  Completed  . ZOSTAVAX  Completed  . PNA vac Low Risk Adult  Completed      Plan:     I have personally reviewed and addressed the Medicare Annual Wellness questionnaire and have noted the following in the patient's chart:  A. Medical and social history B. Use of alcohol, tobacco or illicit drugs  C. Current medications and supplements D. Functional ability and status E.  Nutritional status F.    Physical activity G. Advance directives H. List of other physicians I.  Hospitalizations, surgeries, and ER visits in previous 12 months J.  [place] to include hearing, vision, cognitive, depression L. Referrals and appointments - none  In addition, I have reviewed and discussed with patient certain preventive protocols, quality metrics, and best practice recommendations. A written personalized care plan for preventive services as well as general preventive health recommendations were provided to patient.  See attached scanned questionnaire for additional information.   Signed,   Lindell Noe, MHA, BS, LPN Health Coach

## 2016-10-07 NOTE — Progress Notes (Signed)
I reviewed health advisor's note, was available for consultation, and agree with documentation and plan.  

## 2016-10-07 NOTE — Patient Instructions (Signed)
Ms. Burgert , Thank you for taking time to come for your Medicare Wellness Visit. I appreciate your ongoing commitment to your health goals. Please review the following plan we discussed and let me know if I can assist you in the future.   These are the goals we discussed: Goals    . Increase physical activity          Starting 10/07/16, I will continue to do yoga and stretching at least 60 min 3 days a week.        This is a list of the screening recommended for you and due dates:  Health Maintenance  Topic Date Due  . Tetanus Vaccine  04/29/2024*  . Flu Shot  Completed  . DEXA scan (bone density measurement)  Completed  . Shingles Vaccine  Completed  . Pneumonia vaccines  Completed  *Topic was postponed. The date shown is not the original due date.   Preventive Care for Adults  A healthy lifestyle and preventive care can promote health and wellness. Preventive health guidelines for adults include the following key practices.  . A routine yearly physical is a good way to check with your health care provider about your health and preventive screening. It is a chance to share any concerns and updates on your health and to receive a thorough exam.  . Visit your dentist for a routine exam and preventive care every 6 months. Brush your teeth twice a day and floss once a day. Good oral hygiene prevents tooth decay and gum disease.  . The frequency of eye exams is based on your age, health, family medical history, use  of contact lenses, and other factors. Follow your health care provider's ecommendations for frequency of eye exams.  . Eat a healthy diet. Foods like vegetables, fruits, whole grains, low-fat dairy products, and lean protein foods contain the nutrients you need without too many calories. Decrease your intake of foods high in solid fats, added sugars, and salt. Eat the right amount of calories for you. Get information about a proper diet from your health care provider, if  necessary.  . Regular physical exercise is one of the most important things you can do for your health. Most adults should get at least 150 minutes of moderate-intensity exercise (any activity that increases your heart rate and causes you to sweat) each week. In addition, most adults need muscle-strengthening exercises on 2 or more days a week.  Silver Sneakers may be a benefit available to you. To determine eligibility, you may visit the website: www.silversneakers.com or contact program at 845-356-8127 Mon-Fri between 8AM-8PM.   . Maintain a healthy weight. The body mass index (BMI) is a screening tool to identify possible weight problems. It provides an estimate of body fat based on height and weight. Your health care provider can find your BMI and can help you achieve or maintain a healthy weight.   For adults 20 years and older: ? A BMI below 18.5 is considered underweight. ? A BMI of 18.5 to 24.9 is normal. ? A BMI of 25 to 29.9 is considered overweight. ? A BMI of 30 and above is considered obese.   . Maintain normal blood lipids and cholesterol levels by exercising and minimizing your intake of saturated fat. Eat a balanced diet with plenty of fruit and vegetables. Blood tests for lipids and cholesterol should begin at age 53 and be repeated every 5 years. If your lipid or cholesterol levels are high, you are over 50,  or you are at high risk for heart disease, you may need your cholesterol levels checked more frequently. Ongoing high lipid and cholesterol levels should be treated with medicines if diet and exercise are not working.  . If you smoke, find out from your health care provider how to quit. If you do not use tobacco, please do not start.  . If you choose to drink alcohol, please do not consume more than 2 drinks per day. One drink is considered to be 12 ounces (355 mL) of beer, 5 ounces (148 mL) of wine, or 1.5 ounces (44 mL) of liquor.  . If you are 69-2 years old, ask your  health care provider if you should take aspirin to prevent strokes.  . Use sunscreen. Apply sunscreen liberally and repeatedly throughout the day. You should seek shade when your shadow is shorter than you. Protect yourself by wearing long sleeves, pants, a wide-brimmed hat, and sunglasses year round, whenever you are outdoors.  . Once a month, do a whole body skin exam, using a mirror to look at the skin on your back. Tell your health care provider of new moles, moles that have irregular borders, moles that are larger than a pencil eraser, or moles that have changed in shape or color.

## 2016-10-07 NOTE — Progress Notes (Signed)
PCP notes:   Health maintenance:  Tetanus - postponed/insurance  Abnormal screenings:   Hearing- failed  Patient concerns:   None  Nurse concerns:  None  Next PCP appt:   10/09/16 @ 0915

## 2016-10-07 NOTE — Progress Notes (Signed)
Pre visit review using our clinic review tool, if applicable. No additional management support is needed unless otherwise documented below in the visit note. 

## 2016-10-08 LAB — DIGOXIN LEVEL: DIGOXIN LVL: 0.6 ug/L — AB (ref 0.8–2.0)

## 2016-10-09 ENCOUNTER — Ambulatory Visit (INDEPENDENT_AMBULATORY_CARE_PROVIDER_SITE_OTHER)
Admission: RE | Admit: 2016-10-09 | Discharge: 2016-10-09 | Disposition: A | Discharge status: Home / Self Care | Payer: Medicare Other | Source: Ambulatory Visit | Attending: Family Medicine | Admitting: Family Medicine

## 2016-10-09 ENCOUNTER — Ambulatory Visit (INDEPENDENT_AMBULATORY_CARE_PROVIDER_SITE_OTHER): Payer: Medicare Other | Admitting: Family Medicine

## 2016-10-09 ENCOUNTER — Encounter: Payer: Self-pay | Admitting: Family Medicine

## 2016-10-09 VITALS — BP 118/66 | Temp 97.5°F | Ht 68.25 in | Wt 136.5 lb

## 2016-10-09 DIAGNOSIS — M25579 Pain in unspecified ankle and joints of unspecified foot: Secondary | ICD-10-CM | POA: Insufficient documentation

## 2016-10-09 DIAGNOSIS — Z66 Do not resuscitate: Secondary | ICD-10-CM | POA: Diagnosis not present

## 2016-10-09 DIAGNOSIS — E039 Hypothyroidism, unspecified: Secondary | ICD-10-CM | POA: Diagnosis not present

## 2016-10-09 DIAGNOSIS — J432 Centrilobular emphysema: Secondary | ICD-10-CM | POA: Diagnosis not present

## 2016-10-09 DIAGNOSIS — E785 Hyperlipidemia, unspecified: Secondary | ICD-10-CM

## 2016-10-09 DIAGNOSIS — I4891 Unspecified atrial fibrillation: Secondary | ICD-10-CM

## 2016-10-09 DIAGNOSIS — I872 Venous insufficiency (chronic) (peripheral): Secondary | ICD-10-CM | POA: Insufficient documentation

## 2016-10-09 DIAGNOSIS — R0602 Shortness of breath: Secondary | ICD-10-CM | POA: Diagnosis not present

## 2016-10-09 DIAGNOSIS — B351 Tinea unguium: Secondary | ICD-10-CM | POA: Insufficient documentation

## 2016-10-09 NOTE — Assessment & Plan Note (Signed)
With onychomycosis. Refer to podiatry.

## 2016-10-09 NOTE — Progress Notes (Signed)
Pre visit review using our clinic review tool, if applicable. No additional management support is needed unless otherwise documented below in the visit note. 

## 2016-10-09 NOTE — Assessment & Plan Note (Signed)
Deteriorated. Check CXR today.

## 2016-10-09 NOTE — Addendum Note (Signed)
Addended by: Lucille Passy on: 10/09/2016 09:41 AM   Modules accepted: Level of Service

## 2016-10-09 NOTE — Assessment & Plan Note (Signed)
Well controlled. No changes made to rxs today. 

## 2016-10-09 NOTE — Progress Notes (Addendum)
Subjective:   Patient ID: [name], female    DOB: Aug 29, 1932, 83 y.o.   MRN: [id number]  [name] is a pleasant 83 y.o. year old female who presents to clinic today with Follow-up  on 10/09/2016  HPI:  Annual wellness visit with [name], RN on 10/07/16. Note reviewed.  Bilateral foot pain- "malformed" an affecting her walking.  Afib-   Diagnosed during pre op clearance for cataracts surgery in 2010.  Has been on digoxin and coumadin since.    Denies any CP or palpitations.  Saw Dr. Rockey Situ on 04/10/16.  Note reviewed. No changes were made to her rxs.  Lab Results  Component Value Date   INR 2.5 09/19/2016   INR 2.4 08/22/2016   INR 2.55 02/01/2016   COPD- feels this is worsening despite taking Adviar.   Hypothyroidism- .  Has been on same dose of synthroid for "as long as she can remember." Denies any symptoms of hypo or hyperthyroidism.  Lab Results  Component Value Date   TSH 1.68 10/07/2016   HLD- continues to refuse statin. Lab Results  Component Value Date   CHOL 233 (H) 10/07/2016   HDL 75.90 10/07/2016   LDLCALC 140 (H) 10/07/2016   LDLDIRECT 126.5 07/08/2013   TRIG 85.0 10/07/2016   CHOLHDL 3 10/07/2016   Current Outpatient Prescriptions on File Prior to Visit  Medication Sig Dispense Refill  . ADVAIR DISKUS 100-50 MCG/DOSE AEPB INHALE 1 PUFF BY MOUTH TWICE DAILY 60 each 5  . Cholecalciferol (VITAMIN D-3) 1000 UNITS CAPS Take 1,000 Units by mouth every other day.     Marland Kitchen DIGOX 125 MCG tablet TAKE 1 TABLET BY MOUTH EVERY DAY 90 tablet 3  . Lactobacillus (ACIDOPHILUS PO) Take by mouth.    . levothyroxine (SYNTHROID, LEVOTHROID) 50 MCG tablet TAKE 1 TABLET BY MOUTH EVERY OTHER DAY AND ALTERNATE WITH 1/2 TABLET 90 tablet 0  . verapamil (CALAN-SR) 120 MG CR tablet TAKE 1 TABLET(120 MG) BY MOUTH DAILY 90 tablet 3  . warfarin (COUMADIN) 5 MG tablet TAKE AS DIRECTED BY ANTI-COAGULATION CLINIC 360 tablet 0   No current facility-administered  medications on file prior to visit.     Allergies  Allergen Reactions  . Sulfa Antibiotics Nausea And Vomiting    Past Medical History:  Diagnosis Date  . Atrial fibrillation ([place])   . COPD (chronic obstructive pulmonary disease) ([place])   . Hypothyroidism   . Microscopic colitis   . Osteoporosis   . Squamous cell carcinoma   . Syncope     Past Surgical History:  Procedure Laterality Date  . APPENDECTOMY    . CATARACT EXTRACTION Bilateral   . COLONOSCOPY    . LIPOMA EXCISION    . MULTIPLE TOOTH EXTRACTIONS    . REFRACTIVE SURGERY      Family History  Problem Relation Age of Onset  . Alzheimer's disease Mother   . Brain cancer Father   . Cancer Brother 20    bladder    Social History   Social History  . Marital status: Widowed    Spouse name: N/A  . Number of children: N/A  . Years of education: N/A   Occupational History  . Not on file.   Social History Main Topics  . Smoking status: Former Smoker    Packs/day: 1.00    Years: 40.00    Types: Cigarettes  . Smokeless tobacco: Never Used  . Alcohol use Yes     Comment: social  .  Drug use: No  . Sexual activity: No   Other Topics Concern  . Not on file   Social History Narrative   Recently moved to [facility] 03/2013 from [place].   Husband died in 11/11/2011.      Son (adopted) lives in [place], [place].  Two grandchildren- 49, 1 yo.   DNR- forms signed and returned to patient.            The PMH, PSH, Social History, Family History, Medications, and allergies have been reviewed in [facility], and have been updated if relevant.  Review of Systems  Constitutional: Negative.   HENT: Negative.   Eyes: Negative.   Respiratory: Positive for shortness of breath.   Gastrointestinal: Negative.   Endocrine: Negative.   Genitourinary: Negative.   Musculoskeletal: Negative.   Allergic/Immunologic: Negative.   Neurological: Negative.   Hematological: Negative.   Psychiatric/Behavioral: Negative.   All other  systems reviewed and are negative.      Objective:    BP 118/66   Temp 97.5 F (36.4 C) (Oral)   Ht 5' 8.25" (1.734 m)   Wt 136 lb 8 oz (61.9 kg)   BMI 20.60 kg/m    Physical Exam  Constitutional: She is oriented to person, place, and time. She appears well-developed and well-nourished. No distress.  HENT:  Head: Normocephalic.  Eyes: Conjunctivae are normal.  Neck: Neck supple.  Cardiovascular: An irregularly irregular rhythm present.  Pulmonary/Chest: Effort normal and breath sounds normal. No respiratory distress.  Musculoskeletal: She exhibits no edema.  Both feet have some bony protrusions and nail thickening and discoloration.  Venous changes of lower leg bilateral  +pes planus bilaterally  Neurological: She is alert and oriented to person, place, and time. No cranial nerve deficit.  Skin: She is not diaphoretic.  Psychiatric: She has a normal mood and affect. Her behavior is normal. Judgment and thought content normal.          Assessment & Plan:   Hyperlipidemia, unspecified hyperlipidemia type  DNR (do not resuscitate)  Hypothyroidism, unspecified type  Centrilobular emphysema ([place])  Atrial fibrillation, unspecified type ([place]) No Follow-up on file.

## 2016-10-09 NOTE — Assessment & Plan Note (Signed)
Chronic afib. Rate controlled. On coumadin followed by Dr. Rockey Situ.

## 2016-10-09 NOTE — Assessment & Plan Note (Signed)
Chronic changes.

## 2016-10-09 NOTE — Patient Instructions (Signed)
Great to see you. I will call you with xray results from today.  Please stop by to see Rosaria Ferries on your way out.

## 2016-10-16 ENCOUNTER — Ambulatory Visit (INDEPENDENT_AMBULATORY_CARE_PROVIDER_SITE_OTHER): Payer: Medicare Other

## 2016-10-16 ENCOUNTER — Ambulatory Visit (INDEPENDENT_AMBULATORY_CARE_PROVIDER_SITE_OTHER): Payer: Medicare Other | Admitting: Podiatry

## 2016-10-16 ENCOUNTER — Telehealth: Payer: Self-pay | Admitting: *Deleted

## 2016-10-16 VITALS — BP 106/73 | HR 80 | Resp 16

## 2016-10-16 DIAGNOSIS — M2040 Other hammer toe(s) (acquired), unspecified foot: Secondary | ICD-10-CM | POA: Diagnosis not present

## 2016-10-16 DIAGNOSIS — R52 Pain, unspecified: Secondary | ICD-10-CM

## 2016-10-16 DIAGNOSIS — R0989 Other specified symptoms and signs involving the circulatory and respiratory systems: Secondary | ICD-10-CM

## 2016-10-16 DIAGNOSIS — M21379 Foot drop, unspecified foot: Secondary | ICD-10-CM

## 2016-10-16 NOTE — Progress Notes (Signed)
   Subjective:    Patient ID: [name], female    DOB: 1931/09/25, 83 y.o.   MRN: [id number]  HPI: She presents today as a 83 year old white female with a chief complaint of hammertoes and bunions this into the getting worse. She is also concerned about the fungus in the nail of the hallux bilaterally and the lesser toes. States that she had a podiatrist prescribed for medication for this in the past that did not work    Review of Systems  HENT: Positive for tinnitus.   Hematological: Bruises/bleeds easily.  All other systems reviewed and are negative.      Objective:   Physical Exam: While signs are stable alert and oriented 3 pulses are nonpalpable bilateral. Venous distention is noted. She does have venous insufficiency which has resulted in hemosiderin deposition to the legs and the dorsal aspect of the bilateral foot. Muscle strength is normal bilateral. Though she does have a weak tibialis anterior tendon possibly associated with her foot drop. This could be neuropathy. Orthopedic evaluation develops severe hallux abductovalgus deformity and hammertoe deformities. She is requesting surgical intervention and I explicitly explained to her that this should've been done many years ago and that her vascular status is to report this point. Radiographs don't demonstrate that she has osteolysis and osteopenia with severe deformity. Cutaneous evaluation demonstrates nail dystrophy and possible onychomycosis. No open lesions or wounds are noted.        Assessment & Plan:  Assessment: Hallux abductovalgus deformity hammertoe deformities bilateral. Neurologic complications as well as vascular insufficiency  Plan: Referring her to neurology intravascular for evaluations.

## 2016-10-16 NOTE — Telephone Encounter (Addendum)
-----   Message from [place] sent at 10/16/2016  2:48 PM EST ----- Regarding: Referrals  Referrals-  1) Vascular studies - [facility] - non palpable     pulses bilateral   2) Neurology consult - [facility] - drop foot  Thanks!! Orders for arterial dopplers and referral for evaluation and treatment to [facility] with clinicals and demographic. Orders to [facility] for referral for drop foot with clinicals and demographics.

## 2016-10-17 ENCOUNTER — Ambulatory Visit (INDEPENDENT_AMBULATORY_CARE_PROVIDER_SITE_OTHER): Payer: PRIVATE HEALTH INSURANCE

## 2016-10-17 DIAGNOSIS — I482 Chronic atrial fibrillation: Secondary | ICD-10-CM | POA: Diagnosis not present

## 2016-10-17 LAB — POCT INR: INR: 2.9

## 2016-10-17 NOTE — Patient Instructions (Signed)
Pre visit review using our clinic review tool, if applicable. No additional management support is needed unless otherwise documented below in the visit note. 

## 2016-10-18 ENCOUNTER — Other Ambulatory Visit (INDEPENDENT_AMBULATORY_CARE_PROVIDER_SITE_OTHER): Payer: Self-pay | Admitting: Vascular Surgery

## 2016-10-18 ENCOUNTER — Encounter (INDEPENDENT_AMBULATORY_CARE_PROVIDER_SITE_OTHER): Payer: Self-pay | Admitting: Vascular Surgery

## 2016-10-18 ENCOUNTER — Ambulatory Visit (INDEPENDENT_AMBULATORY_CARE_PROVIDER_SITE_OTHER): Payer: Medicare Other | Admitting: Vascular Surgery

## 2016-10-18 ENCOUNTER — Ambulatory Visit (INDEPENDENT_AMBULATORY_CARE_PROVIDER_SITE_OTHER): Payer: Medicare Other

## 2016-10-18 VITALS — BP 107/71 | HR 88 | Resp 15 | Ht 68.5 in | Wt 135.0 lb

## 2016-10-18 DIAGNOSIS — J432 Centrilobular emphysema: Secondary | ICD-10-CM | POA: Diagnosis not present

## 2016-10-18 DIAGNOSIS — E785 Hyperlipidemia, unspecified: Secondary | ICD-10-CM

## 2016-10-18 DIAGNOSIS — M79604 Pain in right leg: Secondary | ICD-10-CM

## 2016-10-18 DIAGNOSIS — R0989 Other specified symptoms and signs involving the circulatory and respiratory systems: Secondary | ICD-10-CM | POA: Diagnosis not present

## 2016-10-18 DIAGNOSIS — I4891 Unspecified atrial fibrillation: Secondary | ICD-10-CM | POA: Diagnosis not present

## 2016-10-18 DIAGNOSIS — M79609 Pain in unspecified limb: Secondary | ICD-10-CM | POA: Insufficient documentation

## 2016-10-18 DIAGNOSIS — I872 Venous insufficiency (chronic) (peripheral): Secondary | ICD-10-CM | POA: Diagnosis not present

## 2016-10-18 DIAGNOSIS — M79605 Pain in left leg: Secondary | ICD-10-CM

## 2016-10-18 NOTE — Assessment & Plan Note (Signed)
Has a history of venous intervention about 5 years ago and has symptoms that sound consistent with possible venous insufficiency currently. Does not have arterial insufficiency. Were going to check venous reflux studies at her convenience in the near future. I will see her back following the studies.

## 2016-10-18 NOTE — Assessment & Plan Note (Signed)
On anticoagulation 

## 2016-10-18 NOTE — Patient Instructions (Signed)
Venous Stasis or Chronic Venous Insufficiency Chronic venous insufficiency, also called venous stasis, is a condition that affects the veins in the legs. The condition prevents blood from being pumped through these veins effectively. Blood may no longer be pumped effectively from the legs back to the heart. This condition can range from mild to severe. With proper treatment, you should be able to continue with an active life. CAUSES  Chronic venous insufficiency occurs when the vein walls become stretched, weakened, or damaged or when valves within the vein are damaged. Some common causes of this include:  High blood pressure inside the veins (venous hypertension).  Increased blood pressure in the leg veins from long periods of sitting or standing.  A blood clot that blocks blood flow in a vein (deep vein thrombosis).  Inflammation of a superficial vein (phlebitis) that causes a blood clot to form. RISK FACTORS Various things can make you more likely to develop chronic venous insufficiency, including:  Family history of this condition.  Obesity.  Pregnancy.  Sedentary lifestyle.  Smoking.  Jobs requiring long periods of standing or sitting in one place.  Being a certain age. Women in their 40s and 50s and men in their 70s are more likely to develop this condition. SIGNS AND SYMPTOMS  Symptoms may include:   Varicose veins.  Skin breakdown or ulcers.  Reddened or discolored skin on the leg.  Brown, smooth, tight, and painful skin just above the ankle, usually on the inside surface (lipodermatosclerosis).  Swelling. DIAGNOSIS  To diagnose this condition, your health care provider will take a medical history and do a physical exam. The following tests may be ordered to confirm the diagnosis:  Duplex ultrasound-A procedure that produces a picture of a blood vessel and nearby organs and also provides information on blood flow through the blood vessel.  Plethysmography-A  procedure that tests blood flow.  A venogram, or venography-A procedure used to look at the veins using X-ray and dye. TREATMENT The goals of treatment are to help you return to an active life and to minimize pain or disability. Treatment will depend on the severity of the condition. Medical procedures may be needed for severe cases. Treatment options may include:   Use of compression stockings. These can help with symptoms and lower the chances of the problem getting worse, but they do not cure the problem.  Sclerotherapy-A procedure involving an injection of a material that "dissolves" the damaged veins. Other veins in the network of blood vessels take over the function of the damaged veins.  Surgery to remove the vein or cut off blood flow through the vein (vein stripping or laser ablation surgery).  Surgery to repair a valve. HOME CARE INSTRUCTIONS   Wear compression stockings as directed by your health care provider.  Only take over-the-counter or prescription medicines for pain, discomfort, or fever as directed by your health care provider.  Follow up with your health care provider as directed. SEEK MEDICAL CARE IF:   You have redness, swelling, or increasing pain in the affected area.  You see a red streak or line that extends up or down from the affected area.  You have a breakdown or loss of skin in the affected area, even if the breakdown is small.  You have an injury to the affected area. SEEK IMMEDIATE MEDICAL CARE IF:   You have an injury and open wound in the affected area.  Your pain is severe and does not improve with medicine.  You have   sudden numbness or weakness in the foot or ankle below the affected area, or you have trouble moving your foot or ankle.  You have a fever or persistent symptoms for more than 2-3 days.  You have a fever and your symptoms suddenly get worse. MAKE SURE YOU:   Understand these instructions.  Will watch your condition.  Will  get help right away if you are not doing well or get worse. This information is not intended to replace advice given to you by your health care provider. Make sure you discuss any questions you have with your health care provider. Document Released: 01/06/2007 Document Revised: 06/23/2013 Document Reviewed: 05/10/2013 Elsevier Interactive Patient Education  2017 [employer]  

## 2016-10-18 NOTE — Assessment & Plan Note (Signed)
To evaluate her arterial perfusion of the lower extremities, noninvasive studies were performed today. Her right ABI was greater than 1.3 consistent with noncompressible vessel, but her waveforms were good and her digit pressure was normal. Her left ABI was 1.04 with good wave forms as well. It does not appear as if she has significant lower extremity arterial insufficiency. I suspect a large component of her symptoms are from neuropathy. I do think venous insufficiency could be contributing as well, and we will check reflux study at her convenience in the near future.

## 2016-10-18 NOTE — Assessment & Plan Note (Signed)
lipid control important in reducing the progression of atherosclerotic disease.   

## 2016-10-18 NOTE — Progress Notes (Signed)
Patient ID: [name], female   DOB: 11-05-1931, 83 y.o.   MRN: [id number]  Chief Complaint  Patient presents with  . New Evaluation    Diminishing pulses    HPI [name] is a 83 y.o. female.  I am asked to see the patient by Dr. Milinda Pointer for evaluation of lower extremity circulation.  The patient reports A previous history of treatment for venous insufficiency of the right leg associated with ulceration about 5 years ago in [place]. It sounds like she had venous ablation performed for a nonhealing ulceration. She is now having problems with pain, discoloration, as well as numbness and tingling in the feet. This is affecting both legs. She has purplish discoloration of both feet and ankles. She does not have a lot of swelling, but her skin changes have become much more prominent over the past couple of years. There was no trauma, injury, or inciting event that started her symptoms. She reports having an appointment with neurology up-and-coming for suspected neuropathy. To evaluate her arterial perfusion of the lower extremities, noninvasive studies were performed today. Her right ABI was greater than 1.3 consistent with noncompressible vessel, but her waveforms were good and her digit pressure was normal. Her left ABI was 1.04 with good wave forms as well. It does not appear as if she has significant lower extremity arterial insufficiency.  Past Medical History:  Diagnosis Date  . Atrial fibrillation ([place])   . COPD (chronic obstructive pulmonary disease) ([place])   . Hypothyroidism   . Microscopic colitis   . Osteoporosis   . Squamous cell carcinoma   . Syncope     Past Surgical History:  Procedure Laterality Date  . APPENDECTOMY    . CATARACT EXTRACTION Bilateral   . COLONOSCOPY    . LIPOMA EXCISION    . MULTIPLE TOOTH EXTRACTIONS    . REFRACTIVE SURGERY      Family History  Problem Relation Age of Onset  . Alzheimer's disease Mother   . Brain cancer Father   . Cancer  Brother 80    bladder  No bleeding or clotting disorders  Social History Social History  Substance Use Topics  . Smoking status: Former Smoker    Packs/day: 1.00    Years: 40.00    Types: Cigarettes  . Smokeless tobacco: Never Used  . Alcohol use Yes     Comment: social  Widowed  Allergies  Allergen Reactions  . Sulfa Antibiotics Nausea And Vomiting    Current Outpatient Prescriptions  Medication Sig Dispense Refill  . ADVAIR DISKUS 100-50 MCG/DOSE AEPB INHALE 1 PUFF BY MOUTH TWICE DAILY 60 each 5  . Cholecalciferol (VITAMIN D-3) 1000 UNITS CAPS Take 1,000 Units by mouth every other day.     Marland Kitchen DIGOX 125 MCG tablet TAKE 1 TABLET BY MOUTH EVERY DAY 90 tablet 3  . Lactobacillus (ACIDOPHILUS PO) Take by mouth.    . levothyroxine (SYNTHROID, LEVOTHROID) 50 MCG tablet TAKE 1 TABLET BY MOUTH EVERY OTHER DAY AND ALTERNATE WITH 1/2 TABLET 90 tablet 0  . verapamil (CALAN-SR) 120 MG CR tablet TAKE 1 TABLET(120 MG) BY MOUTH DAILY 90 tablet 3  . warfarin (COUMADIN) 5 MG tablet TAKE AS DIRECTED BY ANTI-COAGULATION CLINIC 360 tablet 0   No current facility-administered medications for this visit.       REVIEW OF SYSTEMS (Negative unless checked)  Constitutional: [] Weight loss  [] Fever  [] Chills Cardiac: [] Chest pain   [] Chest pressure   [x] Palpitations   [] Shortness  of breath when laying flat   [] Shortness of breath at rest   [] Shortness of breath with exertion. Vascular:  [] Pain in legs with walking   [] Pain in legs at rest   [] Pain in legs when laying flat   [] Claudication   [] Pain in feet when walking  [] Pain in feet at rest  [] Pain in feet when laying flat   [] History of DVT   [] Phlebitis   [] Swelling in legs   [x] Varicose veins   [] Non-healing ulcers Pulmonary:   [] Uses home oxygen   [] Productive cough   [] Hemoptysis   [] Wheeze  [x] COPD   [] Asthma Neurologic:  [] Dizziness  [] Blackouts   [] Seizures   [] History of stroke   [] History of TIA  [] Aphasia   [] Temporary blindness    [] Dysphagia   [] Weakness or numbness in arms   [] Weakness or numbness in legs Musculoskeletal:  [] Arthritis   [] Joint swelling   [] Joint pain   [] Low back pain Hematologic:  [] Easy bruising  [] Easy bleeding   [] Hypercoagulable state   [] Anemic  [] Hepatitis Gastrointestinal:  [] Blood in stool   [] Vomiting blood  [] Gastroesophageal reflux/heartburn   [] Abdominal pain Genitourinary:  [] Chronic kidney disease   [] Difficult urination  [] Frequent urination  [] Burning with urination   [] Hematuria Skin:  [] Rashes   [] Ulcers   [] Wounds Psychological:  [] History of anxiety   []  History of major depression.    Physical Exam BP 107/71 (BP Location: Right Arm)   Pulse 88   Resp 15   Ht 5' 8.5" (1.74 m)   Wt 135 lb (61.2 kg)   BMI 20.23 kg/m  Gen: Thin, NAD. Appears younger than stated age Head: [place]/AT, No temporalis wasting. Prominent temp pulse not noted. Ear/Nose/Throat: Hearing grossly intact, nares w/o erythema or drainage, oropharynx w/o Erythema/Exudate Eyes: Conjunctiva clear, sclera non-icteric  Neck: trachea midline.  No JVD.  Pulmonary:  Good air movement, no use of accessory muscles.  Cardiac: Irregularly irregular Vascular:  Vessel Right Left  Radial Palpable Palpable  Ulnar Palpable Palpable  Brachial Palpable Palpable  Carotid Palpable, without bruit Palpable, without bruit  Aorta Not palpable N/A  Femoral Palpable Palpable  Popliteal Palpable Palpable  PT Palpable 1+ Palpable  DP 1+ Palpable Palpable   Gastrointestinal: soft, non-tender/non-distended. No guarding/reflex. No masses, surgical incisions, or scars. Musculoskeletal: M/S 5/5 throughout.  No deformity or atrophy. Significant stasis changes are present bilaterally. Prominent varicose veins are present in the right leg with more scattered varicosities present in the left leg Neurologic: Sensation grossly intact in extremities.  Symmetrical.  Speech is fluent. Motor exam as listed above. Psychiatric: Judgment intact,  Mood & affect appropriate for pt's clinical situation. Dermatologic: No rashes or ulcers noted.  No cellulitis or open wounds. Lymph : No Cervical, Axillary, or Inguinal lymphadenopathy.   Radiology Dg Chest 2 View  Result Date: 10/09/2016 CLINICAL DATA:  COPD with shortness of Breath EXAM: CHEST  2 VIEW COMPARISON:  None. FINDINGS: Cardiac shadow is within normal limits. The lungs are hyperinflated consistent with COPD. Considerable but formation is noted inferiorly on the right. No sizable infiltrate or effusion is seen. No parenchymal nodules are noted. No bony abnormality is seen. IMPRESSION: COPD without acute abnormality. Electronically Signed   By: Inez Catalina M.D.   On: 10/09/2016 10:26    Labs Recent Results (from the past 2160 hour(s))  Protime-INR     Status: None   Collection Time: 07/25/16 12:00 AM  Result Value Ref Range   INR  0.9 - 1.1  POCT INR     Status: None   Collection Time: 08/22/16 12:00 AM  Result Value Ref Range   INR 2.4   POCT INR     Status: None   Collection Time: 09/19/16 12:00 AM  Result Value Ref Range   INR 2.5   Digoxin level     Status: Abnormal   Collection Time: 10/07/16  1:45 PM  Result Value Ref Range   Digoxin Level 0.6 (L) 0.8 - 2.0 ug/L    Comment: ** Please note change in unit of measure. ** FAB Anti-Digoxin (Digibind(R)) in serum/plasma of patients under toxicity therapy may interfere with the digoxin immunoassay.   TSH     Status: None   Collection Time: 10/07/16  1:45 PM  Result Value Ref Range   TSH 1.68 0.35 - 4.50 uIU/mL  CBC with Differential     Status: None   Collection Time: 10/07/16  1:45 PM  Result Value Ref Range   WBC 6.8 4.0 - 10.5 K/uL   RBC 4.90 3.87 - 5.11 Mil/uL   Hemoglobin 15.0 12.0 - 15.0 g/dL   HCT 45.2 36.0 - 46.0 %   MCV 92.2 78.0 - 100.0 fl   MCHC 33.3 30.0 - 36.0 g/dL   RDW 14.2 11.5 - 15.5 %   Platelets 201.0 150.0 - 400.0 K/uL   Neutrophils Relative % 63.3 43.0 - 77.0 %   Lymphocytes Relative  28.1 12.0 - 46.0 %   Monocytes Relative 6.5 3.0 - 12.0 %   Eosinophils Relative 1.5 0.0 - 5.0 %   Basophils Relative 0.6 0.0 - 3.0 %   Neutro Abs 4.3 1.4 - 7.7 K/uL   Lymphs Abs 1.9 0.7 - 4.0 K/uL   Monocytes Absolute 0.4 0.1 - 1.0 K/uL   Eosinophils Absolute 0.1 0.0 - 0.7 K/uL   Basophils Absolute 0.0 0.0 - 0.1 K/uL  Comprehensive metabolic panel     Status: None   Collection Time: 10/07/16  1:45 PM  Result Value Ref Range   Sodium 138 135 - 145 mEq/L   Potassium 4.6 3.5 - 5.1 mEq/L   Chloride 102 96 - 112 mEq/L   CO2 30 19 - 32 mEq/L   Glucose, Bld 94 70 - 99 mg/dL   BUN 12 6 - 23 mg/dL   Creatinine, Ser 0.69 0.40 - 1.20 mg/dL   Total Bilirubin 0.6 0.2 - 1.2 mg/dL   Alkaline Phosphatase 79 39 - 117 U/L   AST 31 0 - 37 U/L   ALT 25 0 - 35 U/L   Total Protein 7.5 6.0 - 8.3 g/dL   Albumin 4.3 3.5 - 5.2 g/dL   Calcium 10.0 8.4 - 10.5 mg/dL   GFR 86.40 >60.00 mL/min  Lipid Panel     Status: Abnormal   Collection Time: 10/07/16  1:45 PM  Result Value Ref Range   Cholesterol 233 (H) 0 - 200 mg/dL    Comment: ATP III Classification       Desirable:  < 200 mg/dL               Borderline High:  200 - 239 mg/dL          High:  > = 240 mg/dL   Triglycerides 85.0 0.0 - 149.0 mg/dL    Comment: Normal:  <150 mg/dLBorderline High:  150 - 199 mg/dL   HDL 75.90 >39.00 mg/dL   VLDL 17.0 0.0 - 40.0 mg/dL   LDL Cholesterol 140 (H) 0 - 99 mg/dL  Total CHOL/HDL Ratio 3     Comment:                Men          Women1/2 Average Risk     3.4          3.3Average Risk          5.0          4.42X Average Risk          9.6          7.13X Average Risk          15.0          11.0                       NonHDL 157.23     Comment: NOTE:  Non-HDL goal should be 30 mg/dL higher than patient's LDL goal (i.e. LDL goal of < 70 mg/dL, would have non-HDL goal of < 100 mg/dL)  POCT INR     Status: None   Collection Time: 10/17/16 12:00 AM  Result Value Ref Range   INR 2.9     Assessment/Plan:  Atrial  fibrillation On anticoagulation  HLD (hyperlipidemia) lipid control important in reducing the progression of atherosclerotic disease.    COPD (chronic obstructive pulmonary disease) Stable  Pain in limb To evaluate her arterial perfusion of the lower extremities, noninvasive studies were performed today. Her right ABI was greater than 1.3 consistent with noncompressible vessel, but her waveforms were good and her digit pressure was normal. Her left ABI was 1.04 with good wave forms as well. It does not appear as if she has significant lower extremity arterial insufficiency. I suspect a large component of her symptoms are from neuropathy. I do think venous insufficiency could be contributing as well, and we will check reflux study at her convenience in the near future.  Chronic venous insufficiency Has a history of venous intervention about 5 years ago and has symptoms that sound consistent with possible venous insufficiency currently. Does not have arterial insufficiency. Were going to check venous reflux studies at her convenience in the near future. I will see her back following the studies.      Leotis Pain 10/18/2016, 3:53 PM   This note was created with Dragon medical transcription system.  Any errors from dictation are unintentional.

## 2016-10-18 NOTE — Assessment & Plan Note (Signed)
Stable

## 2016-10-31 ENCOUNTER — Other Ambulatory Visit (INDEPENDENT_AMBULATORY_CARE_PROVIDER_SITE_OTHER): Payer: Self-pay

## 2016-11-14 DIAGNOSIS — I482 Chronic atrial fibrillation: Secondary | ICD-10-CM | POA: Diagnosis not present

## 2016-11-14 LAB — POCT INR: INR: 2.3

## 2016-11-15 ENCOUNTER — Ambulatory Visit (INDEPENDENT_AMBULATORY_CARE_PROVIDER_SITE_OTHER): Payer: PRIVATE HEALTH INSURANCE

## 2016-11-19 NOTE — Patient Instructions (Signed)
Pre visit review using our clinic review tool, if applicable. No additional management support is needed unless otherwise documented below in the visit note. 

## 2016-11-27 DIAGNOSIS — M21371 Foot drop, right foot: Secondary | ICD-10-CM | POA: Diagnosis not present

## 2016-11-27 DIAGNOSIS — M21372 Foot drop, left foot: Secondary | ICD-10-CM | POA: Diagnosis not present

## 2016-11-27 DIAGNOSIS — G629 Polyneuropathy, unspecified: Secondary | ICD-10-CM | POA: Diagnosis not present

## 2016-11-29 ENCOUNTER — Encounter: Payer: Self-pay | Admitting: Family Medicine

## 2016-12-02 MED ORDER — LEVOTHYROXINE SODIUM 50 MCG PO TABS: ORAL_TABLET | ORAL | 3 refills | Status: DC

## 2016-12-06 ENCOUNTER — Encounter: Payer: Self-pay | Admitting: Family Medicine

## 2016-12-12 ENCOUNTER — Ambulatory Visit (INDEPENDENT_AMBULATORY_CARE_PROVIDER_SITE_OTHER): Payer: PRIVATE HEALTH INSURANCE

## 2016-12-12 DIAGNOSIS — I482 Chronic atrial fibrillation: Secondary | ICD-10-CM | POA: Diagnosis not present

## 2016-12-12 LAB — POCT INR: INR: 2.2

## 2016-12-12 NOTE — Patient Instructions (Signed)
Pre visit review using our clinic review tool, if applicable. No additional management support is needed unless otherwise documented below in the visit note. 

## 2016-12-16 ENCOUNTER — Ambulatory Visit (INDEPENDENT_AMBULATORY_CARE_PROVIDER_SITE_OTHER): Payer: Medicare Other | Admitting: Vascular Surgery

## 2016-12-16 ENCOUNTER — Ambulatory Visit (INDEPENDENT_AMBULATORY_CARE_PROVIDER_SITE_OTHER): Payer: Medicare Other

## 2016-12-16 ENCOUNTER — Encounter (INDEPENDENT_AMBULATORY_CARE_PROVIDER_SITE_OTHER): Payer: Self-pay | Admitting: Vascular Surgery

## 2016-12-16 VITALS — BP 107/68 | HR 73 | Resp 16 | Wt 138.0 lb

## 2016-12-16 DIAGNOSIS — M79605 Pain in left leg: Secondary | ICD-10-CM

## 2016-12-16 DIAGNOSIS — M79604 Pain in right leg: Secondary | ICD-10-CM

## 2016-12-16 DIAGNOSIS — I872 Venous insufficiency (chronic) (peripheral): Secondary | ICD-10-CM

## 2016-12-16 DIAGNOSIS — E785 Hyperlipidemia, unspecified: Secondary | ICD-10-CM | POA: Diagnosis not present

## 2016-12-16 NOTE — Progress Notes (Signed)
Subjective:    Patient ID: [name], female    DOB: 08/09/1932, 83 y.o.   MRN: [id number] Chief Complaint  Patient presents with  . Follow-up    Patient presents to review vascular studies. She was last seen on 10/18/16 for lower extremity pain and swelling. ABI at the time was normal. Patients symptoms are stable. She continues to wear medical grade one compression, elevate her legs and remain active. She underwent a bilateral venous duplex which was notable for ablation of her right lower extremity GSV. She does have venous reflux in the left GSV and femoropopliteal venous system. No DVT. Denies fever, nausea or vomiting.    Review of Systems  Constitutional: Negative.   HENT: Negative.   Eyes: Negative.   Respiratory: Negative.   Cardiovascular: Positive for leg swelling.       Lower Extremity Pain  Gastrointestinal: Negative.   Endocrine: Negative.   Genitourinary: Negative.   Musculoskeletal: Negative.   Skin: Negative.   Allergic/Immunologic: Negative.   Neurological: Negative.   Hematological: Negative.   Psychiatric/Behavioral: Negative.       Objective:   Physical Exam  Constitutional: She is oriented to person, place, and time. She appears well-developed and well-nourished. No distress.  HENT:  Head: Normocephalic and atraumatic.  Eyes: Conjunctivae are normal. Pupils are equal, round, and reactive to light.  Neck: Normal range of motion.  Cardiovascular: Normal rate, regular rhythm and normal heart sounds.   Pulses:      Radial pulses are 2+ on the right side, and 2+ on the left side.  Pulmonary/Chest: Effort normal.  Musculoskeletal: Normal range of motion. She exhibits edema (Moderate Bilateral Edema. Severe stasis dermatitis. <1cm diffuse varicose veins. ).  Neurological: She is alert and oriented to person, place, and time.  Skin: Skin is warm and dry. She is not diaphoretic.  Psychiatric: She has a normal mood and affect. Her behavior is normal.  Judgment and thought content normal.   BP 107/68   Pulse 73   Resp 16   Wt 138 lb (62.6 kg)   BMI 20.68 kg/m   Past Medical History:  Diagnosis Date  . Atrial fibrillation ([place])   . COPD (chronic obstructive pulmonary disease) ([place])   . Hypothyroidism   . Microscopic colitis   . Osteoporosis   . Squamous cell carcinoma   . Syncope    Social History   Social History  . Marital status: Widowed    Spouse name: N/A  . Number of children: N/A  . Years of education: N/A   Occupational History  . Not on file.   Social History Main Topics  . Smoking status: Former Smoker    Packs/day: 1.00    Years: 40.00    Types: Cigarettes  . Smokeless tobacco: Never Used  . Alcohol use Yes     Comment: social  . Drug use: No  . Sexual activity: No   Other Topics Concern  . Not on file   Social History Narrative   Recently moved to [facility] 03/2013 from [place].   Husband died in 11/14/11.      Son (adopted) lives in [place], [place].  Two grandchildren- 16, 24 yo.   DNR- forms signed and returned to patient.            Past Surgical History:  Procedure Laterality Date  . APPENDECTOMY    . CATARACT EXTRACTION Bilateral   . COLONOSCOPY    . LIPOMA EXCISION    .  MULTIPLE TOOTH EXTRACTIONS    . REFRACTIVE SURGERY     Family History  Problem Relation Age of Onset  . Alzheimer's disease Mother   . Brain cancer Father   . Cancer Brother 55    bladder   Allergies  Allergen Reactions  . Sulfa Antibiotics Nausea And Vomiting      Assessment & Plan:  Patient presents to review vascular studies. She was last seen on 10/18/16 for lower extremity pain and swelling. ABI at the time was normal. Patients symptoms are stable. She continues to wear medical grade one compression, elevate her legs and remain active. She underwent a bilateral venous duplex which was notable for ablation of her right lower extremity GSV. She does have venous reflux in the left GSV and femoropopliteal venous  system. No DVT. Denies fever, nausea or vomiting.   1. Chronic venous insufficiency - New Recommend left GSV ablation. I spent over 25 minutes with patient explaining venous reflux and lymphedema. Recommended laser ablation to the left extremity and lymphedema pump to control swelling and discomfort.  The patient is unsure if she wants to proceed. She has been given information on venous reflux, lymphedema, laser ablation and lymphedema pump. She will call of she wants to move forward. Continue compression and elevation for now.    2. Hyperlipidemia, unspecified hyperlipidemia type - Stable Encouraged good control as its slows the progression of atherosclerotic disease  Current Outpatient Prescriptions on File Prior to Visit  Medication Sig Dispense Refill  . ADVAIR DISKUS 100-50 MCG/DOSE AEPB INHALE 1 PUFF BY MOUTH TWICE DAILY 60 each 5  . Cholecalciferol (VITAMIN D-3) 1000 UNITS CAPS Take 1,000 Units by mouth every other day.     Marland Kitchen DIGOX 125 MCG tablet TAKE 1 TABLET BY MOUTH EVERY DAY 90 tablet 3  . Lactobacillus (ACIDOPHILUS PO) Take by mouth.    . levothyroxine (SYNTHROID, LEVOTHROID) 50 MCG tablet 1 tablet one day then 1/2 tablet next day alternating. 68 tablet 3  . verapamil (CALAN-SR) 120 MG CR tablet TAKE 1 TABLET(120 MG) BY MOUTH DAILY 90 tablet 3  . warfarin (COUMADIN) 5 MG tablet TAKE AS DIRECTED BY ANTI-COAGULATION CLINIC 360 tablet 0   No current facility-administered medications on file prior to visit.     There are no Patient Instructions on file for this visit. No Follow-up on file.   Amiliana Foutz A Celestino Ackerman, PA-C

## 2017-01-09 DIAGNOSIS — I482 Chronic atrial fibrillation: Secondary | ICD-10-CM | POA: Diagnosis not present

## 2017-01-10 ENCOUNTER — Ambulatory Visit: Payer: Self-pay

## 2017-01-10 LAB — POCT INR: INR: 2.5

## 2017-01-13 ENCOUNTER — Encounter: Payer: Self-pay | Admitting: Family Medicine

## 2017-01-13 ENCOUNTER — Ambulatory Visit (INDEPENDENT_AMBULATORY_CARE_PROVIDER_SITE_OTHER): Payer: Medicare Other | Admitting: Family Medicine

## 2017-01-13 VITALS — BP 132/90 | HR 74 | Temp 97.4°F | Resp 24 | Wt 138.0 lb

## 2017-01-13 DIAGNOSIS — J069 Acute upper respiratory infection, unspecified: Secondary | ICD-10-CM | POA: Diagnosis not present

## 2017-01-13 NOTE — Patient Instructions (Signed)
Great to see you. Your lungs sound great!  Keep taking Delsym like you have been doing for cough.  Drink plenty fluids.

## 2017-01-13 NOTE — Progress Notes (Signed)
Pre visit review using our clinic review tool, if applicable. No additional management support is needed unless otherwise documented below in the visit note. 

## 2017-01-13 NOTE — Progress Notes (Signed)
SUBJECTIVE:  [name] is a 83 y.o. female who complains of coryza, congestion, cough described as nonproductive and bilateral sinus pain for 8 days. She denies a history of anorexia and chest pain and denies a history of asthma. Patient denies smoke cigarettes.   Current Outpatient Prescriptions on File Prior to Visit  Medication Sig Dispense Refill  . ADVAIR DISKUS 100-50 MCG/DOSE AEPB INHALE 1 PUFF BY MOUTH TWICE DAILY 60 each 5  . Cholecalciferol (VITAMIN D-3) 1000 UNITS CAPS Take 1,000 Units by mouth every other day.     Marland Kitchen DIGOX 125 MCG tablet TAKE 1 TABLET BY MOUTH EVERY DAY 90 tablet 3  . gabapentin (NEURONTIN) 100 MG capsule Take 100 mg by mouth 2 (two) times daily.     . Lactobacillus (ACIDOPHILUS PO) Take by mouth.    . levothyroxine (SYNTHROID, LEVOTHROID) 50 MCG tablet 1 tablet one day then 1/2 tablet next day alternating. 68 tablet 3  . verapamil (CALAN-SR) 120 MG CR tablet TAKE 1 TABLET(120 MG) BY MOUTH DAILY 90 tablet 3  . warfarin (COUMADIN) 5 MG tablet TAKE AS DIRECTED BY ANTI-COAGULATION CLINIC 360 tablet 0   No current facility-administered medications on file prior to visit.     Allergies  Allergen Reactions  . Sulfa Antibiotics Nausea And Vomiting    Past Medical History:  Diagnosis Date  . Atrial fibrillation ([place])   . COPD (chronic obstructive pulmonary disease) ([place])   . Hypothyroidism   . Microscopic colitis   . Osteoporosis   . Squamous cell carcinoma   . Syncope     Past Surgical History:  Procedure Laterality Date  . APPENDECTOMY    . CATARACT EXTRACTION Bilateral   . COLONOSCOPY    . LIPOMA EXCISION    . MULTIPLE TOOTH EXTRACTIONS    . REFRACTIVE SURGERY      Family History  Problem Relation Age of Onset  . Alzheimer's disease Mother   . Brain cancer Father   . Cancer Brother 8    bladder    Social History   Social History  . Marital status: Widowed    Spouse name: N/A  . Number of children: N/A  . Years of education: N/A    Occupational History  . Not on file.   Social History Main Topics  . Smoking status: Former Smoker    Packs/day: 1.00    Years: 40.00    Types: Cigarettes  . Smokeless tobacco: Never Used  . Alcohol use Yes     Comment: social  . Drug use: No  . Sexual activity: No   Other Topics Concern  . Not on file   Social History Narrative   Recently moved to [facility] 03/2013 from [place].   Husband died in November 02, 2011.      Son (adopted) lives in [place], [place].  Two grandchildren- 88, 81 yo.   DNR- forms signed and returned to patient.            The PMH, PSH, Social History, Family History, Medications, and allergies have been reviewed in [facility], and have been updated if relevant.  OBJECTIVE: BP 132/90 (BP Location: Right Arm, Patient Position: Sitting, Cuff Size: Small)   Pulse 74   Temp 97.4 F (36.3 C) (Oral)   Resp (!) 24   Wt 138 lb (62.6 kg)   SpO2 97%   BMI 20.68 kg/m   She appears well, vital signs are as noted. Ears normal.  Throat and pharynx normal.  Neck supple. No adenopathy  in the neck. Nose is congested. Sinuses non tender. The chest is clear, without wheezes or rales.  ASSESSMENT:  viral upper respiratory illness  PLAN: Symptomatic therapy suggested: push fluids, rest and return office visit prn if symptoms persist or worsen. Lack of antibiotic effectiveness discussed with her. Call or return to clinic prn if these symptoms worsen or fail to improve as anticipated.

## 2017-01-28 ENCOUNTER — Encounter: Payer: Self-pay | Admitting: Family Medicine

## 2017-01-28 ENCOUNTER — Ambulatory Visit (INDEPENDENT_AMBULATORY_CARE_PROVIDER_SITE_OTHER): Payer: Medicare Other | Admitting: Family Medicine

## 2017-01-28 VITALS — BP 120/70 | HR 82 | Temp 98.1°F | Wt 137.0 lb

## 2017-01-28 DIAGNOSIS — I872 Venous insufficiency (chronic) (peripheral): Secondary | ICD-10-CM | POA: Diagnosis not present

## 2017-01-28 NOTE — Assessment & Plan Note (Signed)
>  25 minutes spent in face to face time with patient, >50% spent in counselling or coordination of care I attempted to explain what I could about the pump versus ablation.  I did recommend that she call Dr. Bunnie Domino office to tell him that she has additional questions. The patient indicates understanding of these issues and agrees with the plan.

## 2017-01-28 NOTE — Patient Instructions (Signed)

## 2017-01-28 NOTE — Progress Notes (Signed)
Subjective:   Patient ID: [name], female    DOB: 03-28-1932, 83 y.o.   MRN: [id number]  [name] is a pleasant 83 y.o. year old female who presents to clinic today with Follow-up  on 01/28/2017  HPI:  [name] is really here to discuss her recent appointment with Vascular.  She is confused about her diagnosis and what has been recommended for her to do.  Note reviewed from 12/16/16- Finally assessment and plan copied below-    1. Chronic venous insufficiency - New Recommend left GSV ablation. I spent over 25 minutes with patient explaining venous reflux and lymphedema. Recommended laser ablation to the left extremity and lymphedema pump to control swelling and discomfort.  The patient is unsure if she wants to proceed. She has been given information on venous reflux, lymphedema, laser ablation and lymphedema pump. She will call of she wants to move forward. Continue compression and elevation for now.    She is unclear as to what a lymphedema pump does and if she should get this pump or have ablation done.  She does wear compression socks daily.  Pain in her legs has improved with the addition of Gabapentin.   Current Outpatient Prescriptions on File Prior to Visit  Medication Sig Dispense Refill  . ADVAIR DISKUS 100-50 MCG/DOSE AEPB INHALE 1 PUFF BY MOUTH TWICE DAILY 60 each 5  . Cholecalciferol (VITAMIN D-3) 1000 UNITS CAPS Take 1,000 Units by mouth every other day.     Marland Kitchen DIGOX 125 MCG tablet TAKE 1 TABLET BY MOUTH EVERY DAY 90 tablet 3  . gabapentin (NEURONTIN) 100 MG capsule Take 100 mg by mouth 4 (four) times daily. Pt takes 2 in the am and 2 at night    . Lactobacillus (ACIDOPHILUS PO) Take by mouth.    . levothyroxine (SYNTHROID, LEVOTHROID) 50 MCG tablet 1 tablet one day then 1/2 tablet next day alternating. 68 tablet 3  . verapamil (CALAN-SR) 120 MG CR tablet TAKE 1 TABLET(120 MG) BY MOUTH DAILY 90 tablet 3  . warfarin (COUMADIN) 5 MG tablet TAKE AS  DIRECTED BY ANTI-COAGULATION CLINIC 360 tablet 0   No current facility-administered medications on file prior to visit.     Allergies  Allergen Reactions  . Sulfa Antibiotics Nausea And Vomiting    Past Medical History:  Diagnosis Date  . Atrial fibrillation ([place])   . COPD (chronic obstructive pulmonary disease) ([place])   . Hypothyroidism   . Microscopic colitis   . Osteoporosis   . Squamous cell carcinoma   . Syncope     Past Surgical History:  Procedure Laterality Date  . APPENDECTOMY    . CATARACT EXTRACTION Bilateral   . COLONOSCOPY    . LIPOMA EXCISION    . MULTIPLE TOOTH EXTRACTIONS    . REFRACTIVE SURGERY      Family History  Problem Relation Age of Onset  . Alzheimer's disease Mother   . Brain cancer Father   . Cancer Brother 51       bladder    Social History   Social History  . Marital status: Widowed    Spouse name: N/A  . Number of children: N/A  . Years of education: N/A   Occupational History  . Not on file.   Social History Main Topics  . Smoking status: Former Smoker    Packs/day: 1.00    Years: 40.00    Types: Cigarettes  . Smokeless tobacco: Never Used  . Alcohol use Yes  Comment: social  . Drug use: No  . Sexual activity: No   Other Topics Concern  . Not on file   Social History Narrative   Recently moved to [facility] 03/2013 from [place].   Husband died in 2011/10/31.      Son (adopted) lives in [place], [place].  Two grandchildren- 22, 29 yo.   DNR- forms signed and returned to patient.            The PMH, PSH, Social History, Family History, Medications, and allergies have been reviewed in [facility], and have been updated if relevant.   Review of Systems  All other systems reviewed and are negative.      Objective:    BP 120/70   Pulse 82   Temp 98.1 F (36.7 C)   Wt 137 lb (62.1 kg)   SpO2 94%   BMI 20.53 kg/m    Physical Exam  Constitutional: She is oriented to person, place, and time. She appears  well-nourished. No distress.  HENT:  Head: Normocephalic and atraumatic.  Eyes: Conjunctivae are normal.  Cardiovascular: Normal rate.   Pulmonary/Chest: Effort normal.  Musculoskeletal: Normal range of motion. She exhibits no edema.  Neurological: She is alert and oriented to person, place, and time. No cranial nerve deficit.  Skin: Skin is warm and dry. She is not diaphoretic.  Psychiatric: She has a normal mood and affect. Her behavior is normal. Judgment and thought content normal.  Nursing note and vitals reviewed.         Assessment & Plan:   Chronic venous insufficiency No Follow-up on file.

## 2017-01-31 ENCOUNTER — Encounter (INDEPENDENT_AMBULATORY_CARE_PROVIDER_SITE_OTHER): Payer: Self-pay | Admitting: Vascular Surgery

## 2017-01-31 ENCOUNTER — Ambulatory Visit (INDEPENDENT_AMBULATORY_CARE_PROVIDER_SITE_OTHER): Payer: Medicare Other | Admitting: Vascular Surgery

## 2017-01-31 VITALS — BP 172/103 | HR 105 | Resp 17 | Ht 68.0 in | Wt 136.0 lb

## 2017-01-31 DIAGNOSIS — I872 Venous insufficiency (chronic) (peripheral): Secondary | ICD-10-CM | POA: Diagnosis not present

## 2017-01-31 DIAGNOSIS — I4891 Unspecified atrial fibrillation: Secondary | ICD-10-CM | POA: Diagnosis not present

## 2017-01-31 DIAGNOSIS — M25579 Pain in unspecified ankle and joints of unspecified foot: Secondary | ICD-10-CM | POA: Diagnosis not present

## 2017-01-31 DIAGNOSIS — J432 Centrilobular emphysema: Secondary | ICD-10-CM

## 2017-01-31 NOTE — Progress Notes (Signed)
MRN : [id number]  [name] is a 83 y.o. ([birth year]) female who presents with chief complaint of  Chief Complaint  Patient presents with  . Re-evaluation    Questions about treatment  .  History of Present Illness: Patient returns today in follow up of Her venous disease and lymphedema. She has been wearing compression stockings daily and her swelling is essentially gone at this point. She still has pain in her legs but she thinks this is more related to foot drop and neuropathy that his venous disease. She had no arterial insufficiency and her studies. Her venous duplex showed deep venous reflux bilaterally and superficial venous reflux in the great saphenous vein on the left.       Past Medical History:  Diagnosis Date  . Atrial fibrillation ([place])   . COPD (chronic obstructive pulmonary disease) ([place])   . Hypothyroidism   . Microscopic colitis   . Osteoporosis   . Squamous cell carcinoma   . Syncope          Past Surgical History:  Procedure Laterality Date  . APPENDECTOMY    . CATARACT EXTRACTION Bilateral   . COLONOSCOPY    . LIPOMA EXCISION    . MULTIPLE TOOTH EXTRACTIONS    . REFRACTIVE SURGERY            Family History  Problem Relation Age of Onset  . Alzheimer's disease Mother   . Brain cancer Father   . Cancer Brother 50    bladder  No bleeding or clotting disorders  Social History        Social History  Substance Use Topics  . Smoking status: Former Smoker    Packs/day: 1.00    Years: 40.00    Types: Cigarettes  . Smokeless tobacco: Never Used  . Alcohol use Yes      Comment: social  Widowed      Allergies  Allergen Reactions  . Sulfa Antibiotics Nausea And Vomiting    Current Outpatient Prescriptions  Medication Sig Dispense Refill  . ADVAIR DISKUS 100-50 MCG/DOSE AEPB INHALE 1 PUFF BY MOUTH TWICE DAILY 60 each 5  . Cholecalciferol (VITAMIN D-3) 1000 UNITS CAPS Take 1,000 Units by mouth  every other day.     Marland Kitchen DIGOX 125 MCG tablet TAKE 1 TABLET BY MOUTH EVERY DAY 90 tablet 3  . Lactobacillus (ACIDOPHILUS PO) Take by mouth.    . levothyroxine (SYNTHROID, LEVOTHROID) 50 MCG tablet TAKE 1 TABLET BY MOUTH EVERY OTHER DAY AND ALTERNATE WITH 1/2 TABLET 90 tablet 0  . verapamil (CALAN-SR) 120 MG CR tablet TAKE 1 TABLET(120 MG) BY MOUTH DAILY 90 tablet 3  . warfarin (COUMADIN) 5 MG tablet TAKE AS DIRECTED BY ANTI-COAGULATION CLINIC 360 tablet 0   No current facility-administered medications for this visit.       REVIEW OF SYSTEMS (Negative unless checked)  Constitutional: [] Weight loss  [] Fever  [] Chills Cardiac: [] Chest pain   [] Chest pressure   [x] Palpitations   [] Shortness of breath when laying flat   [] Shortness of breath at rest   [] Shortness of breath with exertion. Vascular:  [] Pain in legs with walking   [] Pain in legs at rest   [] Pain in legs when laying flat   [] Claudication   [] Pain in feet when walking  [] Pain in feet at rest  [] Pain in feet when laying flat   [] History of DVT   [] Phlebitis   [] Swelling in legs   [x] Varicose veins   [] Non-healing ulcers Pulmonary:   []   Uses home oxygen   [] Productive cough   [] Hemoptysis   [] Wheeze  [x] COPD   [] Asthma Neurologic:  [] Dizziness  [] Blackouts   [] Seizures   [] History of stroke   [] History of TIA  [] Aphasia   [] Temporary blindness   [] Dysphagia   [] Weakness or numbness in arms   [] Weakness or numbness in legs Musculoskeletal:  [] Arthritis   [] Joint swelling   [] Joint pain   [] Low back pain Hematologic:  [] Easy bruising  [] Easy bleeding   [] Hypercoagulable state   [] Anemic  [] Hepatitis Gastrointestinal:  [] Blood in stool   [] Vomiting blood  [] Gastroesophageal reflux/heartburn   [] Abdominal pain Genitourinary:  [] Chronic kidney disease   [] Difficult urination  [] Frequent urination  [] Burning with urination   [] Hematuria Skin:  [] Rashes   [] Ulcers   [] Wounds Psychological:  [] History of anxiety   []  History of major  depression.   Physical Examination  BP (!) 172/103 (BP Location: Right Arm)   Pulse (!) 105   Resp 17   Ht 5\' 8"  (1.727 m)   Wt 136 lb (61.7 kg)   BMI 20.68 kg/m  Gen:  WD/WN, NAD Head: [place]/AT, No temporalis wasting. Ear/Nose/Throat: Hearing grossly intact, nares w/o erythema or drainage, trachea midline Eyes: Conjunctiva clear. Sclera non-icteric Neck: Supple.  No JVD.  Pulmonary:  Good air movement, no use of accessory muscles.  Cardiac: RRR, normal S1, S2 Vascular:  Vessel Right Left  Radial Palpable Palpable                                   Gastrointestinal: soft, non-tender/non-distended.  Musculoskeletal: M/S 5/5 throughout.  No deformity or atrophy. No edema. Stasis changes present bilaterally Neurologic: Sensation grossly intact in extremities.  Symmetrical.  Speech is fluent.  Psychiatric: Judgment intact, Mood & affect appropriate for pt's clinical situation. Dermatologic: No rashes or ulcers noted.  No cellulitis or open wounds.       Labs Recent Results (from the past 2160 hour(s))  POCT INR     Status: None   Collection Time: 11/14/16 12:00 AM  Result Value Ref Range   INR 2.3   POCT INR     Status: None   Collection Time: 12/12/16 12:00 AM  Result Value Ref Range   INR 2.2   POCT INR     Status: None   Collection Time: 01/10/17 12:00 AM  Result Value Ref Range   INR 2.5     Radiology No results found.    Assessment/Plan  Atrial fibrillation On anticoagulation  HLD (hyperlipidemia) lipid control important in reducing the progression of atherosclerotic disease.    COPD (chronic obstructive pulmonary disease) Stable Chronic venous insufficiency Currently well controlled with compression stockings and conservative measures. No need for intervention at this point. Also likely no need for a lymphedema pump and lesser symptoms worsen.    Leotis Pain, MD  01/31/2017 4:13 PM    This note was created with Dragon medical  transcription system.  Any errors from dictation are purely unintentional

## 2017-01-31 NOTE — Assessment & Plan Note (Signed)
Currently well controlled with compression stockings and conservative measures. No need for intervention at this point. Also likely no need for a lymphedema pump and lesser symptoms worsen.

## 2017-02-06 ENCOUNTER — Encounter: Payer: Self-pay | Admitting: Family Medicine

## 2017-02-06 ENCOUNTER — Ambulatory Visit (INDEPENDENT_AMBULATORY_CARE_PROVIDER_SITE_OTHER): Payer: Medicare Other

## 2017-02-06 DIAGNOSIS — I482 Chronic atrial fibrillation: Secondary | ICD-10-CM | POA: Diagnosis not present

## 2017-02-06 LAB — POCT INR: INR: 1.6

## 2017-02-06 LAB — PROTIME-INR

## 2017-02-06 NOTE — Patient Instructions (Signed)
Pre visit review using our clinic review tool, if applicable. No additional management support is needed unless otherwise documented below in the visit note. 

## 2017-02-13 ENCOUNTER — Ambulatory Visit (INDEPENDENT_AMBULATORY_CARE_PROVIDER_SITE_OTHER)
Admission: RE | Admit: 2017-02-13 | Discharge: 2017-02-13 | Disposition: A | Discharge status: Home / Self Care | Payer: Medicare Other | Source: Ambulatory Visit | Attending: Family Medicine | Admitting: Family Medicine

## 2017-02-13 ENCOUNTER — Ambulatory Visit (INDEPENDENT_AMBULATORY_CARE_PROVIDER_SITE_OTHER): Payer: Medicare Other | Admitting: Family Medicine

## 2017-02-13 VITALS — BP 100/70 | HR 86 | Temp 97.7°F | Wt 137.5 lb

## 2017-02-13 DIAGNOSIS — M25562 Pain in left knee: Secondary | ICD-10-CM | POA: Diagnosis not present

## 2017-02-13 DIAGNOSIS — S8992XA Unspecified injury of left lower leg, initial encounter: Secondary | ICD-10-CM | POA: Diagnosis not present

## 2017-02-13 DIAGNOSIS — H40003 Preglaucoma, unspecified, bilateral: Secondary | ICD-10-CM | POA: Diagnosis not present

## 2017-02-13 NOTE — Progress Notes (Signed)
Pre visit review using our clinic review tool, if applicable. No additional management support is needed unless otherwise documented below in the visit note. 

## 2017-02-13 NOTE — Progress Notes (Signed)
SUBJECTIVE: [name] is a 83 y.o. female who sustained a left knee injury 2 week(s) ago. Mechanism of injury- tripped and fell on both knees. Immediate symptoms: immediate pain. Symptoms have been constant since that time. Prior history of related problems: no prior problems with this area in the past.  Current Outpatient Prescriptions on File Prior to Visit  Medication Sig Dispense Refill  . ADVAIR DISKUS 100-50 MCG/DOSE AEPB INHALE 1 PUFF BY MOUTH TWICE DAILY 60 each 5  . Cholecalciferol (VITAMIN D-3) 1000 UNITS CAPS Take 1,000 Units by mouth every other day.     Marland Kitchen DIGOX 125 MCG tablet TAKE 1 TABLET BY MOUTH EVERY DAY 90 tablet 3  . gabapentin (NEURONTIN) 100 MG capsule Take 100 mg by mouth 4 (four) times daily. Pt takes 2 in the am and 2 at night    . Lactobacillus (ACIDOPHILUS PO) Take by mouth.    . levothyroxine (SYNTHROID, LEVOTHROID) 50 MCG tablet 1 tablet one day then 1/2 tablet next day alternating. 68 tablet 3  . verapamil (CALAN-SR) 120 MG CR tablet TAKE 1 TABLET(120 MG) BY MOUTH DAILY 90 tablet 3  . warfarin (COUMADIN) 5 MG tablet TAKE AS DIRECTED BY ANTI-COAGULATION CLINIC 360 tablet 0   No current facility-administered medications on file prior to visit.     Allergies  Allergen Reactions  . Sulfa Antibiotics Nausea And Vomiting    Past Medical History:  Diagnosis Date  . Atrial fibrillation ([place])   . COPD (chronic obstructive pulmonary disease) ([place])   . Hypothyroidism   . Microscopic colitis   . Osteoporosis   . Squamous cell carcinoma   . Syncope     Past Surgical History:  Procedure Laterality Date  . APPENDECTOMY    . CATARACT EXTRACTION Bilateral   . COLONOSCOPY    . LIPOMA EXCISION    . MULTIPLE TOOTH EXTRACTIONS    . REFRACTIVE SURGERY      Family History  Problem Relation Age of Onset  . Alzheimer's disease Mother   . Brain cancer Father   . Cancer Brother 20       bladder    Social History   Social History  . Marital status: Widowed     Spouse name: N/A  . Number of children: N/A  . Years of education: N/A   Occupational History  . Not on file.   Social History Main Topics  . Smoking status: Former Smoker    Packs/day: 1.00    Years: 40.00    Types: Cigarettes  . Smokeless tobacco: Never Used  . Alcohol use Yes     Comment: social  . Drug use: No  . Sexual activity: No   Other Topics Concern  . Not on file   Social History Narrative   Recently moved to [facility] 03/2013 from [place].   Husband died in 11-07-11.      Son (adopted) lives in [place], [place].  Two grandchildren- 77, 23 yo.   DNR- forms signed and returned to patient.            The PMH, PSH, Social History, Family History, Medications, and allergies have been reviewed in [facility], and have been updated if relevant.  OBJECTIVE: BP 100/70   Pulse 86   Temp 97.7 F (36.5 C)   Wt 137 lb 8 oz (62.4 kg)   SpO2 98%   BMI 20.91 kg/m   Vital signs as noted above. Appearance: alert, well appearing, and in no distress. Knee exam: soft  tissue tenderness over left medial joint spce. X-ray: ordered, but results not yet available.  ASSESSMENT: Knee contusion  PLAN: rest the injured area as much as practical Xray to rule out fx due to pt concern See orders for this visit as documented in the electronic medical record.

## 2017-02-20 ENCOUNTER — Ambulatory Visit (INDEPENDENT_AMBULATORY_CARE_PROVIDER_SITE_OTHER): Payer: Medicare Other

## 2017-02-20 DIAGNOSIS — I482 Chronic atrial fibrillation: Secondary | ICD-10-CM | POA: Diagnosis not present

## 2017-02-20 LAB — POCT INR: INR: 3.3

## 2017-02-20 NOTE — Patient Instructions (Signed)
Pre visit review using our clinic review tool, if applicable. No additional management support is needed unless otherwise documented below in the visit note. 

## 2017-03-04 DIAGNOSIS — M21371 Foot drop, right foot: Secondary | ICD-10-CM | POA: Diagnosis not present

## 2017-03-04 DIAGNOSIS — M21372 Foot drop, left foot: Secondary | ICD-10-CM | POA: Diagnosis not present

## 2017-03-04 DIAGNOSIS — G629 Polyneuropathy, unspecified: Secondary | ICD-10-CM | POA: Diagnosis not present

## 2017-03-06 DIAGNOSIS — I482 Chronic atrial fibrillation: Secondary | ICD-10-CM | POA: Diagnosis not present

## 2017-03-06 LAB — POCT INR: INR: 2.4

## 2017-03-07 ENCOUNTER — Ambulatory Visit (INDEPENDENT_AMBULATORY_CARE_PROVIDER_SITE_OTHER): Payer: Medicare Other

## 2017-03-07 NOTE — Patient Instructions (Signed)
Pre visit review using our clinic review tool, if applicable. No additional management support is needed unless otherwise documented below in the visit note. 

## 2017-04-01 ENCOUNTER — Other Ambulatory Visit: Payer: Self-pay | Admitting: Family Medicine

## 2017-04-01 ENCOUNTER — Other Ambulatory Visit: Payer: Self-pay | Admitting: Cardiovascular Disease

## 2017-04-01 NOTE — Telephone Encounter (Signed)
Patient is compliant with coumadin regimen.  Will fill for 3 month supply with 1 refill.

## 2017-04-03 ENCOUNTER — Ambulatory Visit (INDEPENDENT_AMBULATORY_CARE_PROVIDER_SITE_OTHER): Payer: Medicare Other

## 2017-04-03 DIAGNOSIS — I482 Chronic atrial fibrillation: Secondary | ICD-10-CM | POA: Diagnosis not present

## 2017-04-03 LAB — POCT INR: INR: 2.4

## 2017-04-03 NOTE — Patient Instructions (Signed)
Pre visit review using our clinic review tool, if applicable. No additional management support is needed unless otherwise documented below in the visit note. 

## 2017-04-09 NOTE — Progress Notes (Signed)
Cardiology Office Note  Date:  04/10/2017   ID:  Roanna Reaves, DOB May 28, 1932, MRN [id number]  PCP:  Lucille Passy, MD   Chief Complaint  Patient presents with  . other    12 month follow up. Patient states she is doing good. Meds reviewed verbally with patient.     HPI:  Ms. Matsuoka is a 83 year old woman with  chronic atrial fibrillation,  COPD,   40 years of smoking, on warfarin  rate control medications  who presents for routine followup of her atrial fibrillation.  She lives at [facility].  doing relatively well Foot problems, pain in feet, with shoes, leg pain Active, less exercise 2 to 3 days a week goes to do exercise  Problems with vertigo, balance issues, legs weak Seeing [name], Dr. Pryor Ochoa for vertigo  SOB with hills and stairs, okay on flat surfaces  Chronically low blood pressure, she does not feel it. Denies having any orthostasis symptoms   EKG on today's visit shows atrial fibrillation with ventricular rate 83 bpm, no significant ST or T-wave changes  Other past medical history Prior cardiac notes from Dr. Sheppard Coil in [place] indicate she has chronic atrial fibrillation,  echocardiogram in 2010 and March 2014 confirming atrial fibrillation. No old EKGs are available.  Echocardiogram from March 2014 shows normal LV systolic function, severely dilated left and right atrium, mildly elevated right ventricular systolic pressure estimated at 35 mm of mercury  PMH:   has a past medical history of Atrial fibrillation ([place]); COPD (chronic obstructive pulmonary disease) ([place]); Hypothyroidism; Microscopic colitis; Osteoporosis; Squamous cell carcinoma; and Syncope.  PSH:    Past Surgical History:  Procedure Laterality Date  . APPENDECTOMY    . CATARACT EXTRACTION Bilateral   . COLONOSCOPY    . LIPOMA EXCISION    . MULTIPLE TOOTH EXTRACTIONS    . REFRACTIVE SURGERY      Current Outpatient Prescriptions  Medication Sig Dispense Refill  . ADVAIR DISKUS 100-50  MCG/DOSE AEPB INHALE 1 PUFF BY MOUTH TWICE DAILY 60 each 5  . Cholecalciferol (VITAMIN D-3) 1000 UNITS CAPS Take 1,000 Units by mouth every other day.     Marland Kitchen DIGOX 125 MCG tablet TAKE 1 TABLET BY MOUTH EVERY DAY 90 tablet 3  . gabapentin (NEURONTIN) 100 MG capsule Take 100 mg by mouth 4 (four) times daily. Pt takes 2 in the am and 2 at night    . levothyroxine (SYNTHROID, LEVOTHROID) 50 MCG tablet 1 tablet one day then 1/2 tablet next day alternating. 68 tablet 3  . verapamil (CALAN-SR) 120 MG CR tablet TAKE 1 TABLET(120 MG) BY MOUTH DAILY 90 tablet 3  . warfarin (COUMADIN) 5 MG tablet TAKE AS DIRECTED BY ANTI-COAGULATION CLINIC 360 tablet 0  . warfarin (COUMADIN) 5 MG tablet TAKE AS DIRECTED BY ANTI-COAGULATION CLINIC 90 tablet 1   No current facility-administered medications for this visit.      Allergies:   Sulfa antibiotics   Social History:  The patient  reports that she has quit smoking. Her smoking use included Cigarettes. She has a 40.00 pack-year smoking history. She has never used smokeless tobacco. She reports that she drinks alcohol. She reports that she does not use drugs.   Family History:   family history includes Alzheimer's disease in her mother; Brain cancer in her father; Cancer (age of onset: 22) in her brother.    Review of Systems: Review of Systems  Respiratory: Negative.   Cardiovascular: Negative.   Gastrointestinal: Negative.  Musculoskeletal: Negative.        Poor balance, pain in her feet  Neurological: Positive for weakness.  Endo/Heme/Allergies: Bruises/bleeds easily.  Psychiatric/Behavioral: Negative.   All other systems reviewed and are negative.    PHYSICAL EXAM: VS:  BP 102/68 (BP Location: Left Arm, Patient Position: Sitting, Cuff Size: Normal)   Pulse 83   Ht 5\' 8"  (1.727 m)   Wt 138 lb 12 oz (62.9 kg)   BMI 21.10 kg/m  , BMI Body mass index is 21.1 kg/m.  GEN: Well nourished, well developed, in no acute distress  HEENT: normal  Neck:  no JVD, carotid bruits, or masses Cardiac: irregular irregular,  no murmurs, rubs, or gallops,no edema  Respiratory:  clear to auscultation bilaterally, normal work of breathing GI: soft, nontender, nondistended, + BS MS: no deformity or atrophy  Skin: warm and dry, no rash, bruising in LE b/l Neuro:  Strength and sensation are intact Psych: euthymic mood, full affect    Recent Labs: 10/07/2016: ALT 25; BUN 12; Creatinine, Ser 0.69; Hemoglobin 15.0; Platelets 201.0; Potassium 4.6; Sodium 138; TSH 1.68    Lipid Panel Lab Results  Component Value Date   CHOL 233 (H) 10/07/2016   HDL 75.90 10/07/2016   LDLCALC 140 (H) 10/07/2016   TRIG 85.0 10/07/2016      Wt Readings from Last 3 Encounters:  04/10/17 138 lb 12 oz (62.9 kg)  02/13/17 137 lb 8 oz (62.4 kg)  01/31/17 136 lb (61.7 kg)       ASSESSMENT AND PLAN:  Atrial fibrillation, unspecified type ([place]) - Plan: EKG 12-Lead Rate controlled , on anticoagulation No orthostasis sx, BP low We'll continue current medications If she develops orthostasis symptoms we will decrease verapamil dosing  HLD (hyperlipidemia) Does not want a statin  Centrilobular emphysema (HCC) Stable, Some SOB with hills. Not on oxygen  Leg pain Significant problems with her feet, previously seen by podiatry Foot drop. Hammertoes among other foot issues    Total encounter time more than 25 minutes  Greater than 50% was spent in counseling and coordination of care with the patient   Disposition:   F/U  12 months   Orders Placed This Encounter  Procedures  . EKG 12-Lead     Signed, Esmond Plants, M.D., Ph.D. 04/10/2017  [place], [place]

## 2017-04-10 ENCOUNTER — Ambulatory Visit (INDEPENDENT_AMBULATORY_CARE_PROVIDER_SITE_OTHER): Payer: Medicare Other | Admitting: Cardiovascular Disease

## 2017-04-10 VITALS — BP 102/68 | HR 83 | Ht 68.0 in | Wt 138.8 lb

## 2017-04-10 DIAGNOSIS — E782 Mixed hyperlipidemia: Secondary | ICD-10-CM

## 2017-04-10 DIAGNOSIS — I482 Chronic atrial fibrillation, unspecified: Secondary | ICD-10-CM

## 2017-04-10 DIAGNOSIS — J432 Centrilobular emphysema: Secondary | ICD-10-CM

## 2017-04-10 NOTE — Patient Instructions (Addendum)
Medication Instructions:   No medication changes made  Please call if you get dizzy symptoms when you stand up  Labwork:  No new labs needed  Testing/Procedures:  No further testing at this time   Follow-Up: It was a pleasure seeing you in the office today. Please call us if you have new issues that need to be addressed before your next appt.  [phone]  Your physician wants you to follow-up in: 12 months.  You will receive a reminder letter in the mail two months in advance. If you don't receive a letter, please call our office to schedule the follow-up appointment.  If you need a refill on your cardiac medications before your next appointment, please call your pharmacy.

## 2017-05-01 DIAGNOSIS — I482 Chronic atrial fibrillation: Secondary | ICD-10-CM | POA: Diagnosis not present

## 2017-05-01 LAB — POCT INR: INR: 2.1

## 2017-05-02 ENCOUNTER — Ambulatory Visit: Payer: Self-pay

## 2017-05-02 NOTE — Patient Instructions (Signed)
Pre visit review using our clinic review tool, if applicable. No additional management support is needed unless otherwise documented below in the visit note. 

## 2017-05-12 ENCOUNTER — Encounter: Payer: Self-pay | Admitting: Cardiovascular Disease

## 2017-05-22 DIAGNOSIS — X32XXXA Exposure to sunlight, initial encounter: Secondary | ICD-10-CM | POA: Diagnosis not present

## 2017-05-22 DIAGNOSIS — L570 Actinic keratosis: Secondary | ICD-10-CM | POA: Diagnosis not present

## 2017-05-22 DIAGNOSIS — D485 Neoplasm of uncertain behavior of skin: Secondary | ICD-10-CM | POA: Diagnosis not present

## 2017-05-22 DIAGNOSIS — D0472 Carcinoma in situ of skin of left lower limb, including hip: Secondary | ICD-10-CM | POA: Diagnosis not present

## 2017-05-29 ENCOUNTER — Ambulatory Visit: Payer: Self-pay

## 2017-05-29 DIAGNOSIS — I482 Chronic atrial fibrillation: Secondary | ICD-10-CM | POA: Diagnosis not present

## 2017-05-29 LAB — POCT INR: INR: 2.1

## 2017-05-29 NOTE — Patient Instructions (Signed)
Pre visit review using our clinic review tool, if applicable. No additional management support is needed unless otherwise documented below in the visit note. 

## 2017-06-03 DIAGNOSIS — M21371 Foot drop, right foot: Secondary | ICD-10-CM | POA: Diagnosis not present

## 2017-06-03 DIAGNOSIS — M21372 Foot drop, left foot: Secondary | ICD-10-CM | POA: Diagnosis not present

## 2017-06-03 DIAGNOSIS — G629 Polyneuropathy, unspecified: Secondary | ICD-10-CM | POA: Diagnosis not present

## 2017-06-06 ENCOUNTER — Encounter: Payer: Self-pay | Admitting: Family Medicine

## 2017-06-09 ENCOUNTER — Other Ambulatory Visit: Payer: Self-pay | Admitting: Cardiovascular Disease

## 2017-06-25 DIAGNOSIS — Z23 Encounter for immunization: Secondary | ICD-10-CM | POA: Diagnosis not present

## 2017-07-03 ENCOUNTER — Telehealth: Payer: Self-pay

## 2017-07-03 NOTE — Telephone Encounter (Signed)
Lab tech did not show up this am at [facility] to draw patient's INR.  Nurse wants to know whether patient can come in here for check or okay to wait until lab returns next week?  Thanks.

## 2017-07-03 NOTE — Telephone Encounter (Signed)
LM with Sharee Pimple (nurse receptionist), to inform [name] that it is okay to recheck this Monday 10/22 at [facility].  But, if lab does not show at that time, will need to schedule to come in here to the office.

## 2017-07-07 ENCOUNTER — Ambulatory Visit: Payer: Self-pay

## 2017-07-07 DIAGNOSIS — I482 Chronic atrial fibrillation: Secondary | ICD-10-CM | POA: Diagnosis not present

## 2017-07-07 LAB — POCT INR: INR: 2.9

## 2017-07-07 NOTE — Patient Instructions (Signed)
Pre visit review using our clinic review tool, if applicable. No additional management support is needed unless otherwise documented below in the visit note. 

## 2017-07-10 DIAGNOSIS — D0472 Carcinoma in situ of skin of left lower limb, including hip: Secondary | ICD-10-CM | POA: Diagnosis not present

## 2017-07-10 DIAGNOSIS — L905 Scar conditions and fibrosis of skin: Secondary | ICD-10-CM | POA: Diagnosis not present

## 2017-07-10 HISTORY — PX: SQUAMOUS CELL CARCINOMA EXCISION: SHX2433

## 2017-07-14 DIAGNOSIS — T8141XA Infection following a procedure, superficial incisional surgical site, initial encounter: Secondary | ICD-10-CM | POA: Diagnosis not present

## 2017-07-31 ENCOUNTER — Encounter: Payer: Self-pay | Admitting: Family Medicine

## 2017-07-31 DIAGNOSIS — I482 Chronic atrial fibrillation: Secondary | ICD-10-CM | POA: Diagnosis not present

## 2017-07-31 LAB — PROTIME-INR

## 2017-08-01 ENCOUNTER — Ambulatory Visit: Payer: Self-pay

## 2017-08-01 LAB — POCT INR: INR: 2.6

## 2017-08-13 ENCOUNTER — Other Ambulatory Visit: Payer: Self-pay | Admitting: Family Medicine

## 2017-08-28 DIAGNOSIS — I482 Chronic atrial fibrillation: Secondary | ICD-10-CM | POA: Diagnosis not present

## 2017-08-28 LAB — POCT INR: INR: 2.7

## 2017-08-29 ENCOUNTER — Ambulatory Visit: Payer: Self-pay

## 2017-08-29 NOTE — Patient Instructions (Signed)
INR today 2.7  Continue same dose (1 pill - 5mg ) daily and recheck in 4 weeks.  Nurse, Wilford Sports, RN at [facility] made aware and will communicate with patient.

## 2017-09-11 ENCOUNTER — Other Ambulatory Visit: Payer: Self-pay | Admitting: Family Medicine

## 2017-09-19 NOTE — Progress Notes (Signed)
Coumadin visit note reviewed. Agree with assessment and plan.

## 2017-09-25 ENCOUNTER — Ambulatory Visit: Payer: Self-pay

## 2017-09-25 DIAGNOSIS — L97829 Non-pressure chronic ulcer of other part of left lower leg with unspecified severity: Secondary | ICD-10-CM | POA: Diagnosis not present

## 2017-09-25 DIAGNOSIS — Z08 Encounter for follow-up examination after completed treatment for malignant neoplasm: Secondary | ICD-10-CM | POA: Diagnosis not present

## 2017-09-25 DIAGNOSIS — I482 Chronic atrial fibrillation: Secondary | ICD-10-CM | POA: Diagnosis not present

## 2017-09-25 DIAGNOSIS — Z85828 Personal history of other malignant neoplasm of skin: Secondary | ICD-10-CM | POA: Diagnosis not present

## 2017-09-25 DIAGNOSIS — L821 Other seborrheic keratosis: Secondary | ICD-10-CM | POA: Diagnosis not present

## 2017-09-25 LAB — POCT INR: INR: 2.4

## 2017-10-03 NOTE — Patient Instructions (Signed)
INR on 09/25/17:  2.4  Continue taking 1 (5mg ) pill daily and recheck in 4 weeks.  Notified Wilford Sports RN, nurse at [facility] who will contact patient.

## 2017-10-10 ENCOUNTER — Other Ambulatory Visit: Payer: Self-pay | Admitting: Family Medicine

## 2017-10-10 ENCOUNTER — Telehealth: Payer: Self-pay

## 2017-10-10 DIAGNOSIS — Z7901 Long term (current) use of anticoagulants: Secondary | ICD-10-CM

## 2017-10-10 DIAGNOSIS — Z79899 Other long term (current) drug therapy: Secondary | ICD-10-CM

## 2017-10-10 DIAGNOSIS — I482 Chronic atrial fibrillation, unspecified: Secondary | ICD-10-CM

## 2017-10-10 DIAGNOSIS — Z5181 Encounter for therapeutic drug level monitoring: Secondary | ICD-10-CM

## 2017-10-10 NOTE — Telephone Encounter (Signed)
Lm on pts vm requesting a call back to schedule lab appt 

## 2017-10-10 NOTE — Progress Notes (Signed)
Patient requests labs prior to visit

## 2017-10-10 NOTE — Telephone Encounter (Signed)
I have put in future lab orders,  Please schedule her a lab only visit.

## 2017-10-10 NOTE — Telephone Encounter (Signed)
Copied from [place] [id number]. Topic: Inquiry >> Oct 10, 2017 12:57 PM Scherrie Gerlach wrote: Reason for CRM: pt wants to know if she cshould have labs done prior to her visit on 10/15/17? No one said anything about.  Pt last labs 10/07/16. Pt would like a call back and ok to leave message

## 2017-10-13 ENCOUNTER — Other Ambulatory Visit (INDEPENDENT_AMBULATORY_CARE_PROVIDER_SITE_OTHER): Payer: PPO

## 2017-10-13 DIAGNOSIS — I482 Chronic atrial fibrillation, unspecified: Secondary | ICD-10-CM

## 2017-10-13 DIAGNOSIS — Z7901 Long term (current) use of anticoagulants: Secondary | ICD-10-CM

## 2017-10-13 DIAGNOSIS — Z5181 Encounter for therapeutic drug level monitoring: Secondary | ICD-10-CM

## 2017-10-13 DIAGNOSIS — Z79899 Other long term (current) drug therapy: Principal | ICD-10-CM

## 2017-10-13 LAB — CBC WITH DIFFERENTIAL/PLATELET
Basophils Absolute: 0.1 10*3/uL (ref 0.0–0.1)
Basophils Relative: 1.1 % (ref 0.0–3.0)
EOS PCT: 1.4 % (ref 0.0–5.0)
Eosinophils Absolute: 0.1 10*3/uL (ref 0.0–0.7)
HEMATOCRIT: 47.1 % — AB (ref 36.0–46.0)
HEMOGLOBIN: 15.7 g/dL — AB (ref 12.0–15.0)
LYMPHS ABS: 1.2 10*3/uL (ref 0.7–4.0)
Lymphocytes Relative: 22.5 % (ref 12.0–46.0)
MCHC: 33.4 g/dL (ref 30.0–36.0)
MCV: 93.1 fl (ref 78.0–100.0)
MONOS PCT: 7.7 % (ref 3.0–12.0)
Monocytes Absolute: 0.4 10*3/uL (ref 0.1–1.0)
Neutro Abs: 3.6 10*3/uL (ref 1.4–7.7)
Neutrophils Relative %: 67.3 % (ref 43.0–77.0)
Platelets: 192 10*3/uL (ref 150.0–400.0)
RBC: 5.06 Mil/uL (ref 3.87–5.11)
RDW: 14 % (ref 11.5–15.5)
WBC: 5.3 10*3/uL (ref 4.0–10.5)

## 2017-10-13 LAB — COMPREHENSIVE METABOLIC PANEL
ALT: 22 U/L (ref 0–35)
AST: 32 U/L (ref 0–37)
Albumin: 4.3 g/dL (ref 3.5–5.2)
Alkaline Phosphatase: 79 U/L (ref 39–117)
BUN: 12 mg/dL (ref 6–23)
CO2: 29 mEq/L (ref 19–32)
Calcium: 10 mg/dL (ref 8.4–10.5)
Chloride: 104 mEq/L (ref 96–112)
Creatinine, Ser: 0.72 mg/dL (ref 0.40–1.20)
GFR: 82.06 mL/min (ref >60.00)
GLUCOSE: 86 mg/dL (ref 70–99)
POTASSIUM: 3.8 meq/L (ref 3.5–5.1)
Sodium: 140 mEq/L (ref 135–145)
TOTAL PROTEIN: 7.5 g/dL (ref 6.0–8.3)
Total Bilirubin: 0.9 mg/dL (ref 0.2–1.2)

## 2017-10-14 LAB — DIGOXIN LEVEL: Digoxin Level: 0.6 mcg/L — ABNORMAL LOW (ref 0.8–2.0)

## 2017-10-14 NOTE — Telephone Encounter (Signed)
Pt completed labs 1/28

## 2017-10-15 ENCOUNTER — Ambulatory Visit (INDEPENDENT_AMBULATORY_CARE_PROVIDER_SITE_OTHER): Payer: PPO | Admitting: Family Medicine

## 2017-10-15 ENCOUNTER — Encounter: Payer: Self-pay | Admitting: Family Medicine

## 2017-10-15 VITALS — BP 96/66 | HR 80 | Temp 97.6°F | Ht 68.5 in | Wt 139.0 lb

## 2017-10-15 DIAGNOSIS — I4891 Unspecified atrial fibrillation: Secondary | ICD-10-CM | POA: Diagnosis not present

## 2017-10-15 DIAGNOSIS — E785 Hyperlipidemia, unspecified: Secondary | ICD-10-CM | POA: Diagnosis not present

## 2017-10-15 DIAGNOSIS — Z7689 Persons encountering health services in other specified circumstances: Secondary | ICD-10-CM

## 2017-10-15 DIAGNOSIS — I872 Venous insufficiency (chronic) (peripheral): Secondary | ICD-10-CM | POA: Diagnosis not present

## 2017-10-15 DIAGNOSIS — J310 Chronic rhinitis: Secondary | ICD-10-CM

## 2017-10-15 NOTE — Progress Notes (Signed)
Subjective:    Patient ID: [name], female    DOB: 01/09/1932, 84 y.o.   MRN: [id number]  HPI This is an 84 yo female who presents today to establish care. Was previously cared for by Dr. Deborra Medina.  She lives at [facility] and stays very active. She continues to drive. She exercises most days.   Her only concern today is that she has noticed that her nose drips watery drainage every day. This is very annoying as she is always wiping her nose with tissue and it drips as she does her housework. Occasional sneezing, little watery eyes. No cough.   Chronic venous insufficiency- no swelling as long as she put her support socks on first thing in the morning. Had skin cancer removed from left leg several months ago, has taken a long time to heal.   Last CPE- AWV- 10/07/16 Mammo- 07/15/13- no longer desires screening Pap- no longer desires screening Colonoscopy- 07/13/10, no additional screening recommended Tdap- 2--5 Flu- annual Eye- regular Dental-regular Exercise- most days, participates in exercise classes    Past Medical History:  Diagnosis Date  . Atrial fibrillation ([place])   . COPD (chronic obstructive pulmonary disease) ([place])   . Hypothyroidism   . Microscopic colitis   . Osteoporosis   . Squamous cell carcinoma   . Syncope    Past Surgical History:  Procedure Laterality Date  . APPENDECTOMY    . CATARACT EXTRACTION Bilateral   . COLONOSCOPY    . LIPOMA EXCISION    . MULTIPLE TOOTH EXTRACTIONS    . REFRACTIVE SURGERY    . SQUAMOUS CELL CARCINOMA EXCISION  07/10/2017   left shin   Family History  Problem Relation Age of Onset  . Alzheimer's disease Mother   . Brain cancer Father   . Cancer Brother 42       bladder   Social History   Tobacco Use  . Smoking status: Former Smoker    Packs/day: 1.00    Years: 40.00    Pack years: 40.00    Types: Cigarettes  . Smokeless tobacco: Never Used  Substance Use Topics  . Alcohol use: Yes    Comment: social  . Drug  use: No      Review of Systems  Constitutional: Negative for fatigue, fever and unexpected weight change.  HENT: Positive for rhinorrhea and sneezing. Negative for sinus pressure and sore throat.   Respiratory: Negative for cough and shortness of breath.   Cardiovascular: Negative for chest pain, palpitations and leg swelling.  Gastrointestinal: Negative for abdominal pain, constipation and diarrhea.  Genitourinary: Negative for dysuria and frequency.  Skin: Positive for color change (lower legs related to venous insufficiency).  Psychiatric/Behavioral: Positive for sleep disturbance (occasionally when she has something on her mind). Negative for dysphoric mood. The patient is not nervous/anxious.        Objective:   Physical Exam  Constitutional: She is oriented to person, place, and time. She appears well-developed and well-nourished. No distress.  HENT:  Head: Normocephalic and atraumatic.  Mouth/Throat: Oropharynx is clear and moist.  Eyes: Conjunctivae are normal.  Cardiovascular: Normal rate and normal heart sounds. An irregular rhythm present.  Pulmonary/Chest: Effort normal and breath sounds normal.  Musculoskeletal: She exhibits no edema.  Neurological: She is alert and oriented to person, place, and time.  Skin: Skin is warm and dry. She is not diaphoretic.  Bilateral lower extremities with hyperpigmentation. Left medial leg with very small (approximately 3 mm), healing wound. No  drainage or erythema.   Psychiatric: She has a normal mood and affect. Her behavior is normal. Judgment and thought content normal.  Vitals reviewed.     BP 96/66 (BP Location: Left Arm, Patient Position: Sitting, Cuff Size: Normal)   Pulse 80   Temp 97.6 F (36.4 C) (Oral)   Ht 5' 8.5" (1.74 m)   Wt 139 lb (63 kg)   BMI 20.83 kg/m  Wt Readings from Last 3 Encounters:  10/15/17 139 lb (63 kg)  04/10/17 138 lb 12 oz (62.9 kg)  02/13/17 137 lb 8 oz (62.4 kg)   BP Readings from Last 3  Encounters:  10/15/17 96/66  04/10/17 102/68  02/13/17 100/70   Depression screen PHQ 2/9 10/15/2017 10/07/2016 10/03/2015 09/22/2014 06/22/2013  Decreased Interest 0 0 0 0 0  Down, Depressed, Hopeless 0 0 0 0 0  PHQ - 2 Score 0 0 0 0 0       Assessment & Plan:  1. Encounter to establish care - Discussed her desire to forgo regular screenings due to age  53. Chronic venous insufficiency - continue support socks  3. Hyperlipidemia, unspecified hyperlipidemia type - Discussed no further need to check lipids as she would not want any treatement  4. Atrial fibrillation, unspecified type ([place]) - continue coumadin with INR goal of therapy 2-3  5. Chronic rhinitis - can try otc cetirizine daily prn   Clarene Reamer, FNP-BC  [facility] at [facility], [facility]  10/18/2017 6:55 AM

## 2017-10-15 NOTE — Patient Instructions (Signed)
For your drippy nose, I would like you to try generic Zyrtec- cetirizine, once a day for several days. If it is helpful, you can take every day, if not helpful, you can stop it.

## 2017-10-18 ENCOUNTER — Encounter: Payer: Self-pay | Admitting: Family Medicine

## 2017-10-21 ENCOUNTER — Ambulatory Visit (INDEPENDENT_AMBULATORY_CARE_PROVIDER_SITE_OTHER): Payer: PPO

## 2017-10-21 VITALS — BP 92/70 | HR 91 | Temp 98.3°F | Ht 69.0 in | Wt 141.2 lb

## 2017-10-21 DIAGNOSIS — Z0000 Encounter for general adult medical examination without abnormal findings: Secondary | ICD-10-CM

## 2017-10-21 NOTE — Progress Notes (Signed)
Pre visit review using our clinic review tool, if applicable. No additional management support is needed unless otherwise documented below in the visit note. 

## 2017-10-21 NOTE — Progress Notes (Signed)
PCP notes:   Health maintenance:  No gaps identified.  Abnormal screenings:   Hearing - failed  Hearing Screening   125Hz  250Hz  500Hz  1000Hz  2000Hz  3000Hz  4000Hz  6000Hz  8000Hz   Right ear:   0 0 0  0    Left ear:   0 0 40  0     Patient concerns:   None  Nurse concerns:  None  Next PCP appt:   N/A; new patient appt in Jan 2019  I reviewed health advisor's note, was available for consultation on the day of service listed in this note, and agree with documentation and plan. Elsie Stain, MD.

## 2017-10-21 NOTE — Patient Instructions (Signed)
[name] , Thank you for taking time to come for your Medicare Wellness Visit. I appreciate your ongoing commitment to your health goals. Please review the following plan we discussed and let me know if I can assist you in the future.   These are the goals we discussed: Goals    . Increase physical activity     Starting 10/21/2017, I will continue to exercise for at least 30 minutes 6 days per week.        This is a list of the screening recommended for you and due dates:  Health Maintenance  Topic Date Due  . Tetanus Vaccine  04/29/2024*  . Flu Shot  Completed  . DEXA scan (bone density measurement)  Completed  . Pneumonia vaccines  Completed  *Topic was postponed. The date shown is not the original due date.    Preventive Care for Adults  A healthy lifestyle and preventive care can promote health and wellness. Preventive health guidelines for adults include the following key practices.  . A routine yearly physical is a good way to check with your health care provider about your health and preventive screening. It is a chance to share any concerns and updates on your health and to receive a thorough exam.  . Visit your dentist for a routine exam and preventive care every 6 months. Brush your teeth twice a day and floss once a day. Good oral hygiene prevents tooth decay and gum disease.  . The frequency of eye exams is based on your age, health, family medical history, use  of contact lenses, and other factors. Follow your health care provider's recommendations for frequency of eye exams.  . Eat a healthy diet. Foods like vegetables, fruits, whole grains, low-fat dairy products, and lean protein foods contain the nutrients you need without too many calories. Decrease your intake of foods high in solid fats, added sugars, and salt. Eat the right amount of calories for you. Get information about a proper diet from your health care provider, if necessary.  . Regular physical exercise is  one of the most important things you can do for your health. Most adults should get at least 150 minutes of moderate-intensity exercise (any activity that increases your heart rate and causes you to sweat) each week. In addition, most adults need muscle-strengthening exercises on 2 or more days a week.  Silver Sneakers may be a benefit available to you. To determine eligibility, you may visit the website: www.silversneakers.com or contact program at 220 273 9458 Mon-Fri between 8AM-8PM.   . Maintain a healthy weight. The body mass index (BMI) is a screening tool to identify possible weight problems. It provides an estimate of body fat based on height and weight. Your health care provider can find your BMI and can help you achieve or maintain a healthy weight.   For adults 20 years and older: ? A BMI below 18.5 is considered underweight. ? A BMI of 18.5 to 24.9 is normal. ? A BMI of 25 to 29.9 is considered overweight. ? A BMI of 30 and above is considered obese.   . Maintain normal blood lipids and cholesterol levels by exercising and minimizing your intake of saturated fat. Eat a balanced diet with plenty of fruit and vegetables. Blood tests for lipids and cholesterol should begin at age 45 and be repeated every 5 years. If your lipid or cholesterol levels are high, you are over 50, or you are at high risk for heart disease, you may need  your cholesterol levels checked more frequently. Ongoing high lipid and cholesterol levels should be treated with medicines if diet and exercise are not working.  . If you smoke, find out from your health care provider how to quit. If you do not use tobacco, please do not start.  . If you choose to drink alcohol, please do not consume more than 2 drinks per day. One drink is considered to be 12 ounces (355 mL) of beer, 5 ounces (148 mL) of wine, or 1.5 ounces (44 mL) of liquor.  . If you are 60-71 years old, ask your health care provider if you should take  aspirin to prevent strokes.  . Use sunscreen. Apply sunscreen liberally and repeatedly throughout the day. You should seek shade when your shadow is shorter than you. Protect yourself by wearing long sleeves, pants, a wide-brimmed hat, and sunglasses year round, whenever you are outdoors.  . Once a month, do a whole body skin exam, using a mirror to look at the skin on your back. Tell your health care provider of new moles, moles that have irregular borders, moles that are larger than a pencil eraser, or moles that have changed in shape or color.

## 2017-10-21 NOTE — Progress Notes (Signed)
Subjective:   [name] is a 84 y.o. female who presents for Medicare Annual (Subsequent) preventive examination.  Review of Systems:  N/A Cardiac Risk Factors include: advanced age (>80men, >26 women);dyslipidemia     Objective:     Vitals: BP 92/70 (BP Location: Right Arm, Patient Position: Sitting, Cuff Size: Normal)   Pulse 91   Temp 98.3 F (36.8 C) (Oral)   Ht 5\' 9"  (1.753 m)   Wt 141 lb 4 oz (64.1 kg)   SpO2 91%   BMI 20.86 kg/m   Body mass index is 20.86 kg/m.  Advanced Directives 10/21/2017 01/31/2017 12/16/2016 10/07/2016  Does Patient Have a Medical Advance Directive? Yes Yes Yes Yes  Type of Paramedic of [place];Living will [place];Living will [place];Living will [place];Living will  Copy of [place] in Chart? No - copy requested - - No - copy requested    Tobacco Social History   Tobacco Use  Smoking Status Former Smoker  . Packs/day: 1.00  . Years: 40.00  . Pack years: 40.00  . 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 Atrial fibrillation ([place])   . COPD (chronic obstructive pulmonary disease) ([place])   . Hypothyroidism   . Microscopic colitis   . Osteoporosis   . Squamous cell carcinoma   . Syncope    Past Surgical History:    Procedure Laterality Date  . APPENDECTOMY    . CATARACT EXTRACTION Bilateral   . COLONOSCOPY    . LIPOMA EXCISION    . MULTIPLE TOOTH EXTRACTIONS    . REFRACTIVE SURGERY    . SQUAMOUS CELL CARCINOMA EXCISION  07/10/2017   left shin   Family History  Problem Relation Age of Onset  . Alzheimer's disease Mother   . Brain cancer Father   . Cancer Brother 1       bladder   Social History   Socioeconomic History  . Marital status: Widowed    Spouse name: None  . Number of children: None  . Years of education: None  . Highest education level: None  Social Needs  . Financial resource strain: None  . Food insecurity - worry: None  . Food insecurity - inability: None  . Transportation needs - medical: None  . Transportation needs - non-medical: None  Occupational History  . None  Tobacco Use  . Smoking status: Former Smoker    Packs/day: 1.00    Years: 40.00    Pack years: 40.00    Types: Cigarettes  . Smokeless tobacco: Never Used  Substance and Sexual Activity  . Alcohol use: Yes    Comment: social  . Drug use: No  . Sexual activity: No  Other Topics Concern  . None  Social History Narrative   Recently  moved to [facility] 03/2013 from [place].   Husband died in 11/04/11.      Son (adopted) lives in [place], [place].  Two grandchildren- 21, 49 yo.   DNR- forms signed and returned to patient.             Outpatient Encounter Medications as of 10/21/2017  Medication Sig  . ADVAIR DISKUS 100-50 MCG/DOSE AEPB INHALE 1 PUFF BY MOUTH TWICE DAILY  . CETIRIZINE HCL PO Take 5 mg by mouth as needed.  . Cholecalciferol (VITAMIN D-3) 1000 UNITS CAPS Take 1,000 Units by mouth every other day.   Marland Kitchen DIGOX 125 MCG tablet TAKE 1 TABLET BY MOUTH EVERY DAY  . gabapentin (NEURONTIN) 100 MG capsule Take 100 mg by mouth 4 (four) times daily. Pt takes 2 in the am and 2 at night  . levothyroxine (SYNTHROID, LEVOTHROID) 50 MCG tablet 1 tablet one day then 1/2 tablet next day alternating.  .  verapamil (CALAN-SR) 120 MG CR tablet TAKE 1 TABLET(120 MG) BY MOUTH DAILY  . warfarin (COUMADIN) 5 MG tablet TAKE AS DIRECTED BY ANTI-COAGULATION CLINIC   No facility-administered encounter medications on file as of 10/21/2017.     Activities of Daily Living In your present state of health, do you have any difficulty performing the following activities: 10/21/2017 10/15/2017  Hearing? Y Y  Comment - worse over last year  Vision? N N  Difficulty concentrating or making decisions? N N  Walking or climbing stairs? Y Y  Comment - balance issue  Dressing or bathing? N N  Doing errands, shopping? N N  Preparing Food and eating ? N -  Using the Toilet? N -  In the past six months, have you accidently leaked urine? N -  Do you have problems with loss of bowel control? N -  Managing your Medications? N -  Managing your Finances? N -  Housekeeping or managing your Housekeeping? N -  Some recent data might be hidden    Patient Care Team: Elby Beck, FNP as PCP - General (Nurse Practitioner) Oneta Rack, MD (Dermatology) Carloyn Manner, MD as Referring Physician (Otolaryngology) Estill Cotta, MD as Consulting Physician (Ophthalmology) Minna Merritts, MD as Consulting Physician (Cardiology) Josefine Class, MD as Referring Physician (Gastroenterology) Oneta Rack, MD (Dermatology)    Assessment:   This is a routine wellness examination for [name].   Hearing Screening   125Hz  250Hz  500Hz  1000Hz  2000Hz  3000Hz  4000Hz  6000Hz  8000Hz   Right ear:   0 0 0  0    Left ear:   0 0 40  0    Vision Screening Comments: Vision exam in 2018 with Dr. Sandra Cockayne    Exercise Activities and Dietary recommendations Current Exercise Habits: Home exercise routine;Structured exercise class, Type of exercise: yoga;stretching;walking;Other - see comments([employer] class 30 min 2 days/wk; recumbent bike), Time (Minutes): 30, Frequency (Times/Week): 6, Weekly Exercise  (Minutes/Week): 180, Intensity: Mild, Exercise limited by: None identified  Goals    . Increase physical activity     Starting 10/21/2017, I will continue to exercise for at least 30 minutes 6 days per week.        Fall Risk Fall Risk  10/21/2017 10/15/2017 10/07/2016 10/03/2015 09/22/2014  Falls in the past year? No No No No No   Depression Screen PHQ 2/9 Scores 10/21/2017 10/15/2017 10/07/2016 10/03/2015  PHQ - 2 Score 0 0 0 0  PHQ- 9 Score 0 - - -     Cognitive Function MMSE -  Mini Mental State Exam 10/21/2017 10/07/2016  Orientation to time 5 5  Orientation to Place 5 5  Registration 3 3  Attention/ Calculation 0 0  Recall 3 3  Language- name 2 objects 0 0  Language- repeat 1 1  Language- follow 3 step command 3 3  Language- read & follow direction 0 0  Write a sentence 0 0  Copy design 0 0  Total score 20 20       PLEASE NOTE: A Mini-Cog screen was completed. Maximum score is 20. A value of 0 denotes this part of [place] MMSE was not completed or the patient failed this part of the Mini-Cog screening.   Mini-Cog Screening Orientation to Time - Max 5 pts Orientation to Place - Max 5 pts Registration - Max 3 pts Recall - Max 3 pts Language Repeat - Max 1 pts Language Follow 3 Step Command - Max 3 pts   Immunization History  Administered Date(s) Administered  . DTaP 04/30/2004  . Influenza, High Dose Seasonal PF 06/17/2014  . Influenza,inj,Quad PF,6+ Mos 06/22/2013  . Influenza-Unspecified 06/26/2015  . Pneumococcal Conjugate-13 09/29/2014  . Pneumococcal Polysaccharide-23 06/01/2012  . Varicella 08/06/2007    Screening Tests Health Maintenance  Topic Date Due  . TETANUS/TDAP  04/29/2024 (Originally 04/30/2014)  . INFLUENZA VACCINE  Completed  . DEXA SCAN  Completed  . PNA vac Low Risk Adult  Completed      Plan:     I have personally reviewed, addressed, and noted the following in the patient's chart:  A. Medical and social history B. Use of alcohol, tobacco or  illicit drugs  C. Current medications and supplements D. Functional ability and status E.  Nutritional status F.  Physical activity G. Advance directives H. List of other physicians I.  Hospitalizations, surgeries, and ER visits in previous 12 months J.  [place] to include hearing, vision, cognitive, depression L. Referrals and appointments - none  In addition, I have reviewed and discussed with patient certain preventive protocols, quality metrics, and best practice recommendations. A written personalized care plan for preventive services as well as general preventive health recommendations were provided to patient.  See attached scanned questionnaire for additional information.   Signed,   Lindell Noe, MHA, BS, LPN Health Coach

## 2017-10-23 ENCOUNTER — Ambulatory Visit: Payer: Self-pay

## 2017-10-23 DIAGNOSIS — I482 Chronic atrial fibrillation: Secondary | ICD-10-CM | POA: Diagnosis not present

## 2017-10-23 LAB — POCT INR: INR: 1.9

## 2017-10-23 NOTE — Patient Instructions (Signed)
INR today 1.9  Take 7.5mg  today (10/23/17) only and then continue taking 1 (5mg ) pill daily and recheck in 3 weeks.  Notified Wilford Sports RN, nurse at [facility] who will contact patient.

## 2017-10-28 NOTE — Progress Notes (Signed)
I have reviewed this visit and I agree on the patient's plan of dosage and recommendations. Saheed Carrington B Slayton Lubitz, FNP   

## 2017-11-13 ENCOUNTER — Ambulatory Visit (INDEPENDENT_AMBULATORY_CARE_PROVIDER_SITE_OTHER): Payer: PPO | Admitting: General Practice

## 2017-11-13 DIAGNOSIS — I482 Chronic atrial fibrillation: Secondary | ICD-10-CM | POA: Diagnosis not present

## 2017-11-13 DIAGNOSIS — Z7901 Long term (current) use of anticoagulants: Secondary | ICD-10-CM | POA: Diagnosis not present

## 2017-11-13 LAB — PROTIME-INR: INR: 2.6 — AB (ref 0.9–1.1)

## 2017-11-13 LAB — POCT INR: INR: 2.6

## 2017-11-13 NOTE — Patient Instructions (Signed)
Pre visit review using our clinic review tool, if applicable. No additional management support is needed unless otherwise documented below in the visit note.  Continue taking 1 (5mg ) pill daily and recheck in 3 weeks.  Notified Wilford Sports RN, nurse at [facility] who will contact patient.

## 2017-11-17 ENCOUNTER — Ambulatory Visit: Payer: Self-pay

## 2017-11-17 NOTE — Patient Instructions (Signed)
INR on 11/13/17: 2.6  Continue taking 1 (5mg ) pill daily and recheck in 4 weeks.  Notified Wilford Sports RN, nurse at [facility] who will contact patient.

## 2017-12-09 ENCOUNTER — Other Ambulatory Visit: Payer: Self-pay | Admitting: Family Medicine

## 2017-12-11 ENCOUNTER — Other Ambulatory Visit: Payer: Self-pay | Admitting: Family Medicine

## 2017-12-11 ENCOUNTER — Ambulatory Visit: Payer: Self-pay

## 2017-12-11 DIAGNOSIS — I482 Chronic atrial fibrillation: Secondary | ICD-10-CM | POA: Diagnosis not present

## 2017-12-11 LAB — POCT INR: INR: 2.8

## 2017-12-11 NOTE — Telephone Encounter (Signed)
Patient is compliant with coumadin management, will refill X 6 months.   

## 2017-12-12 NOTE — Progress Notes (Signed)
I have reviewed this visit and I agree on the patient's plan of dosage and recommendations. Deborah B Gessner, FNP   

## 2018-01-08 ENCOUNTER — Ambulatory Visit: Payer: Self-pay

## 2018-01-08 DIAGNOSIS — I482 Chronic atrial fibrillation: Secondary | ICD-10-CM | POA: Diagnosis not present

## 2018-01-08 LAB — POCT INR: INR: 2.9

## 2018-01-08 NOTE — Patient Instructions (Signed)
Email Report received from [facility] Nurse: Wilford Sports, RN  INR: 2.9 today  Continue taking 1 (5mg ) pill daily and recheck in 4 weeks.  Notified Wilford Sports RN, nurse at [facility] who will contact patient.

## 2018-01-09 NOTE — Progress Notes (Signed)
I have reviewed this visit and I agree on the patient's plan of dosage and recommendations. Akua Blethen B Avan Gullett, FNP   

## 2018-02-02 ENCOUNTER — Other Ambulatory Visit: Payer: Self-pay | Admitting: Family Medicine

## 2018-02-05 ENCOUNTER — Ambulatory Visit: Payer: Self-pay

## 2018-02-05 DIAGNOSIS — I482 Chronic atrial fibrillation: Secondary | ICD-10-CM | POA: Diagnosis not present

## 2018-02-05 LAB — POCT INR: INR: 2.6 (ref 2.0–3.0)

## 2018-02-05 NOTE — Patient Instructions (Signed)
Email Report received from [facility] Nurse: Susan Moore, RN INR: 2.6  Continue taking 1 (5mg) pill daily and recheck in 4 weeks.  Notified Susan Moore, RN, nurse at [facility] who will contact patient.   

## 2018-02-14 NOTE — Progress Notes (Signed)
I have reviewed this visit and I agree on the patient's plan of dosage and recommendations. Custer Pimenta B Kineta Fudala, FNP   

## 2018-03-05 ENCOUNTER — Ambulatory Visit: Payer: Self-pay

## 2018-03-05 DIAGNOSIS — I482 Chronic atrial fibrillation: Secondary | ICD-10-CM | POA: Diagnosis not present

## 2018-03-05 LAB — POCT INR: INR: 2.6 (ref 2.0–3.0)

## 2018-03-06 LAB — POCT INR: INR: 2.6 (ref 2.0–3.0)

## 2018-03-06 NOTE — Patient Instructions (Signed)
Email Report received from [facility] Nurse: Zenovia Jordan, RN INR: 2.6  Continue taking 1 (5mg ) pill daily and recheck in 4 weeks.  Notified Zenovia Jordan, RN, nurse at [facility] who will contact patient.

## 2018-03-10 IMAGING — DX DG CHEST 2V
2 series · 2 of 2 positions shown · non-contrast
Comparison: None.

CLINICAL DATA: COPD with shortness of Breath

EXAM:
CHEST  2 VIEW

[chest pa]
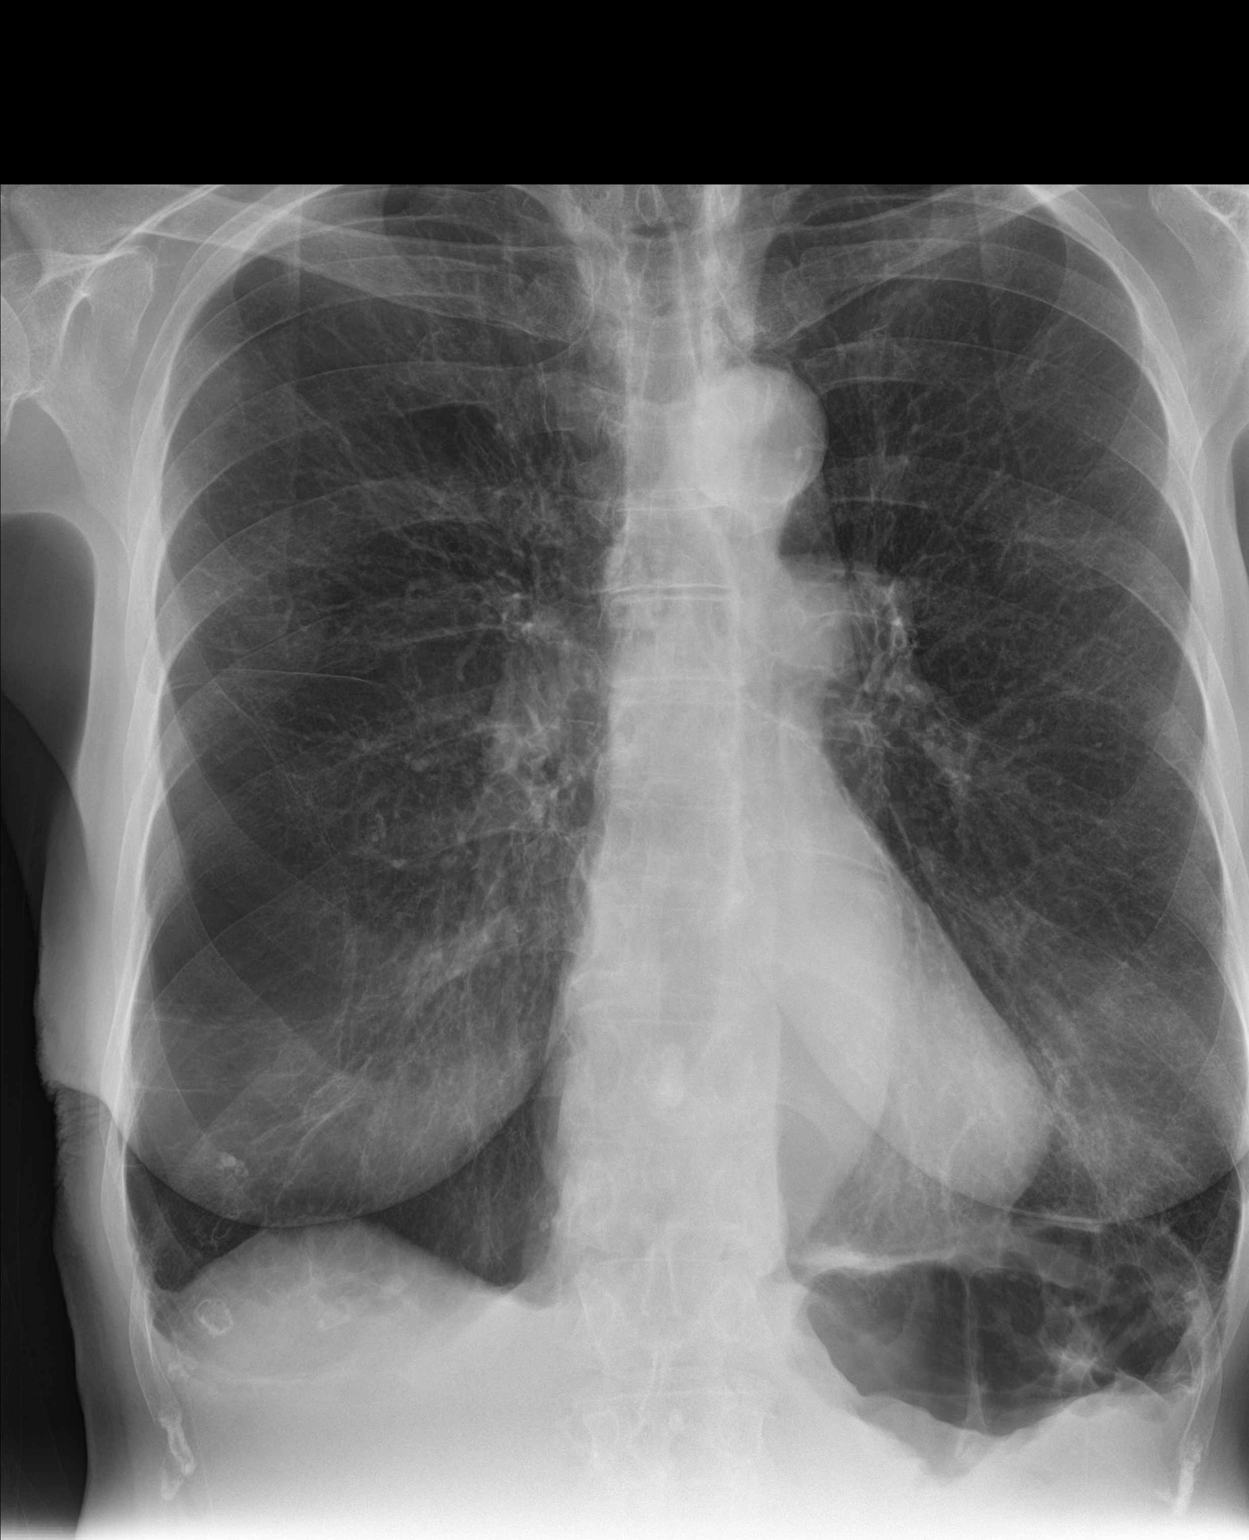

[chest lat]
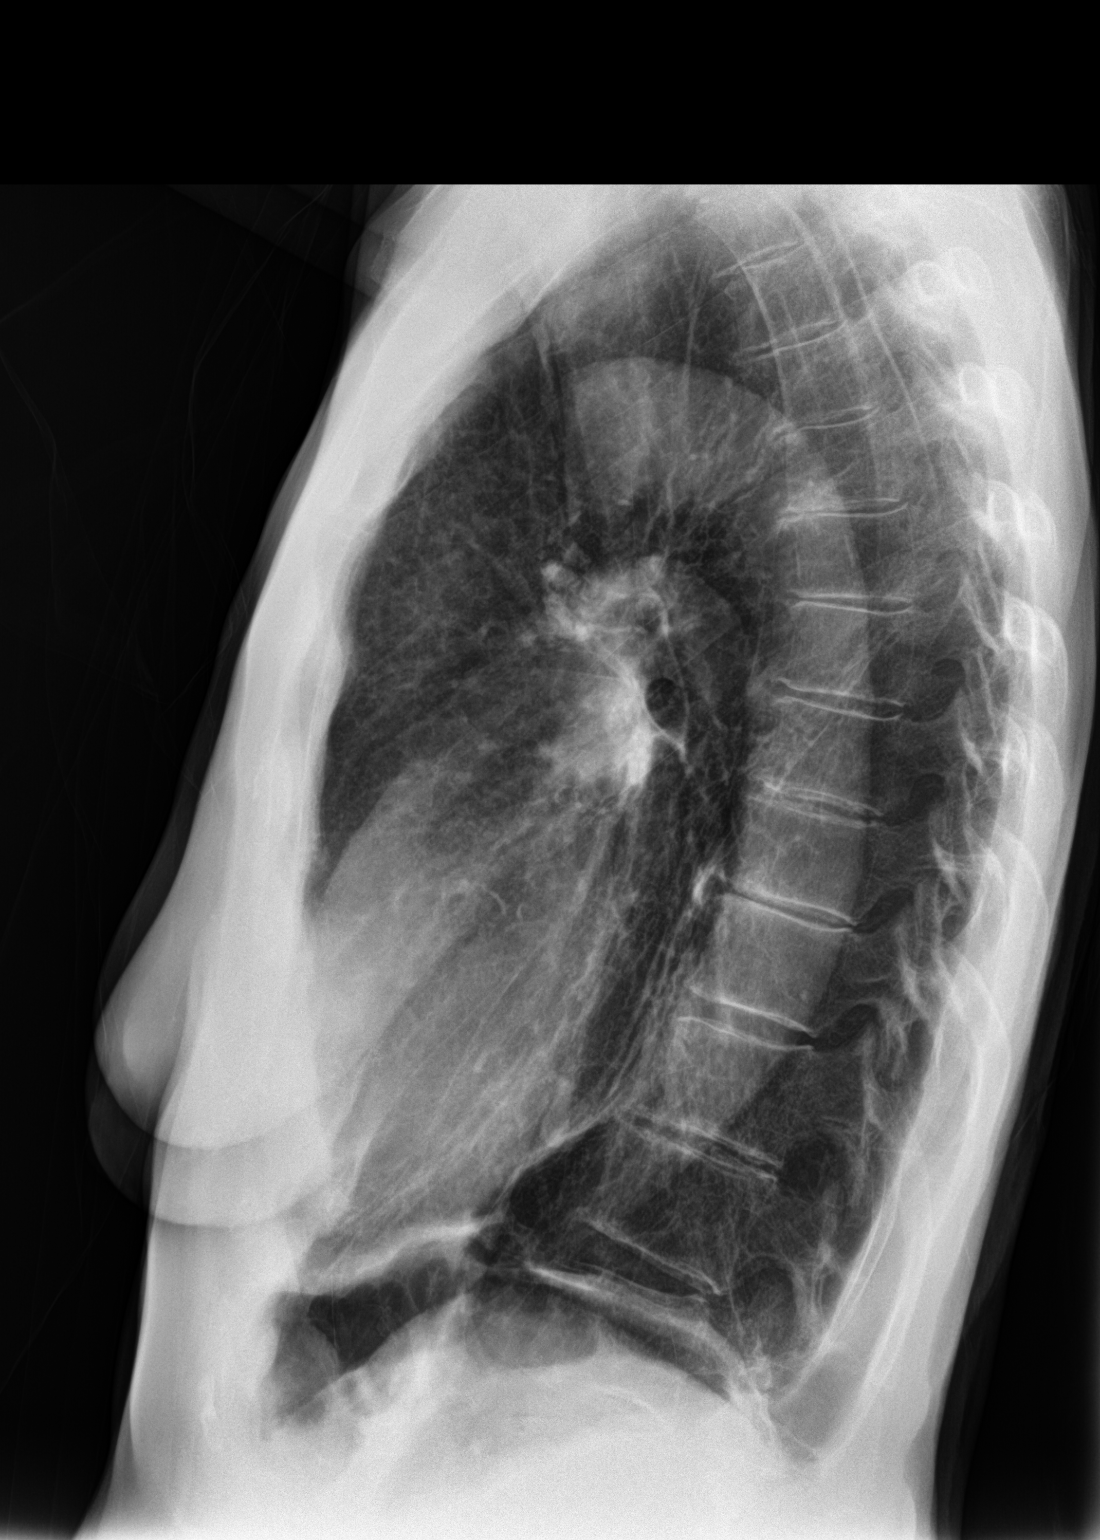

[2 of 2 positions shown; findings below may reference images not displayed]

FINDINGS: Cardiac shadow is within normal limits. The lungs are hyperinflated
consistent with COPD. Considerable but formation is noted inferiorly
on the right. No sizable infiltrate or effusion is seen. No
parenchymal nodules are noted. No bony abnormality is seen.
IMPRESSION: COPD without acute abnormality.

## 2018-03-18 ENCOUNTER — Other Ambulatory Visit: Payer: Self-pay | Admitting: Cardiovascular Disease

## 2018-03-18 NOTE — Progress Notes (Signed)
I have reviewed this visit and I agree on the patient's plan of dosage and recommendations. Deborah B Gessner, FNP   

## 2018-03-20 ENCOUNTER — Other Ambulatory Visit: Payer: Self-pay | Admitting: Family Medicine

## 2018-03-26 DIAGNOSIS — R58 Hemorrhage, not elsewhere classified: Secondary | ICD-10-CM | POA: Diagnosis not present

## 2018-03-26 DIAGNOSIS — L820 Inflamed seborrheic keratosis: Secondary | ICD-10-CM | POA: Diagnosis not present

## 2018-03-26 DIAGNOSIS — Z85828 Personal history of other malignant neoplasm of skin: Secondary | ICD-10-CM | POA: Diagnosis not present

## 2018-03-26 DIAGNOSIS — Z08 Encounter for follow-up examination after completed treatment for malignant neoplasm: Secondary | ICD-10-CM | POA: Diagnosis not present

## 2018-03-26 DIAGNOSIS — L570 Actinic keratosis: Secondary | ICD-10-CM | POA: Diagnosis not present

## 2018-04-02 ENCOUNTER — Ambulatory Visit: Payer: Self-pay

## 2018-04-02 DIAGNOSIS — I482 Chronic atrial fibrillation: Secondary | ICD-10-CM | POA: Diagnosis not present

## 2018-04-03 LAB — POCT INR: INR: 3 (ref 2.0–3.0)

## 2018-04-03 NOTE — Patient Instructions (Signed)
Email Report received from [facility] Nurse: Zenovia Jordan, RN INR: 3.0  Continue taking 1 (5mg ) pill daily and recheck in 4 weeks.  Notified Zenovia Jordan, RN, nurse at [facility] who will contact patient.

## 2018-04-05 NOTE — Progress Notes (Signed)
I have reviewed this visit and I agree on the patient's plan of dosage and recommendations. Deborah B Gessner, FNP   

## 2018-04-06 ENCOUNTER — Ambulatory Visit: Payer: PPO | Admitting: Podiatry

## 2018-04-06 ENCOUNTER — Encounter: Payer: Self-pay | Admitting: Podiatry

## 2018-04-06 DIAGNOSIS — M79675 Pain in left toe(s): Secondary | ICD-10-CM | POA: Diagnosis not present

## 2018-04-06 DIAGNOSIS — M79674 Pain in right toe(s): Secondary | ICD-10-CM | POA: Diagnosis not present

## 2018-04-06 DIAGNOSIS — B351 Tinea unguium: Secondary | ICD-10-CM

## 2018-04-06 DIAGNOSIS — D689 Coagulation defect, unspecified: Secondary | ICD-10-CM

## 2018-04-07 NOTE — Progress Notes (Signed)
Complaint:  Visit Type: Patient returns to my office for continued preventative foot care services. Complaint: Patient states" my nails have grown long and thick and become painful to walk and wear shoes" The patient presents for preventative foot care services. No changes to ROS.  Patient is taking coumadin.  Podiatric Exam: Vascular: dorsalis pedis and posterior tibial pulses are not  palpable bilateral. Capillary return is immediate. Temperature gradient is WNL. Skin turgor WNL .  Brown discoloration legs/foot  B/L. Sensorium: Normal Semmes Weinstein monofilament test. Normal tactile sensation bilaterally. Nail Exam: Pt has thick disfigured discolored nails with subungual debris noted bilateral entire nail hallux through fifth toenails Ulcer Exam: There is no evidence of ulcer or pre-ulcerative changes or infection. Orthopedic Exam: Muscle tone and strength are WNL. No limitations in general ROM. No crepitus or effusions noted. Foot type and digits show no abnormalities.HAV  B/L.    Hammer toes  2-4  B/L. Skin: No Porokeratosis. No infection or ulcers  Diagnosis:  Onychomycosis, , Pain in right toe, pain in left toes  Treatment & Plan Procedures and Treatment: Consent by patient was obtained for treatment procedures.   Debridement of mycotic and hypertrophic toenails, 1 through 5 bilateral and clearing of subungual debris. No ulceration, no infection noted.  Return Visit-Office Procedure: Patient instructed to return to the office for a follow up visit 3 months for continued evaluation and treatment.    Gardiner Barefoot DPM

## 2018-04-11 NOTE — Progress Notes (Deleted)
Cardiology Office Note  Date:  04/11/2018   ID:  Chelcie Estorga, DOB Aug 12, 1932, MRN [id number]  PCP:  Elby Beck, FNP   No chief complaint on file.   HPI:  Ms. Chiu is a 84 year old woman with  chronic atrial fibrillation,  COPD,   40 years of smoking, on warfarin  rate control medications  who presents for routine followup of her atrial fibrillation.  She lives at [address] [place].  doing relatively well Foot problems, pain in feet, with shoes, leg pain Active, less exercise 2 to 3 days a week goes to do exercise  Problems with vertigo, balance issues, legs weak Seeing [name], Dr. Pryor Ochoa for vertigo  SOB with hills and stairs, okay on flat surfaces  Chronically low blood pressure, she does not feel it. Denies having any orthostasis symptoms   EKG on today's visit shows atrial fibrillation with ventricular rate 83 bpm, no significant ST or T-wave changes  Other past medical history Prior cardiac notes from Dr. Sheppard Coil in [place] indicate she has chronic atrial fibrillation,  echocardiogram in 2010 and March 2014 confirming atrial fibrillation. No old EKGs are available.  Echocardiogram from March 2014 shows normal LV systolic function, severely dilated left and right atrium, mildly elevated right ventricular systolic pressure estimated at 35 mm of mercury  PMH:   has a past medical history of Atrial fibrillation ([place]), COPD (chronic obstructive pulmonary disease) ([place]), Hypothyroidism, Microscopic colitis, Osteoporosis, Squamous cell carcinoma, and Syncope.  PSH:    Past Surgical History:  Procedure Laterality Date  . APPENDECTOMY    . CATARACT EXTRACTION Bilateral   . COLONOSCOPY    . LIPOMA EXCISION    . MULTIPLE TOOTH EXTRACTIONS    . REFRACTIVE SURGERY    . SQUAMOUS CELL CARCINOMA EXCISION  07/10/2017   left shin    Current Outpatient Medications  Medication Sig Dispense Refill  . ADVAIR DISKUS 100-50 MCG/DOSE AEPB USE 1 INHALATION BY MOUTH TWICE DAILY  180 each 2  . CETIRIZINE HCL PO Take 5 mg by mouth as needed.    . Cholecalciferol (VITAMIN D-3) 1000 UNITS CAPS Take 1,000 Units by mouth every other day.     Marland Kitchen DIGOX 125 MCG tablet TAKE 1 TABLET BY MOUTH EVERY DAY 90 tablet 0  . gabapentin (NEURONTIN) 100 MG capsule Take 100 mg by mouth 4 (four) times daily. Pt takes 2 in the am and 2 at night    . levothyroxine (SYNTHROID, LEVOTHROID) 50 MCG tablet TAKE 1 TABLET BY MOUTH DAILY, THEN 1/2 TABLET THE NEXT DAY ALTERNATING 68 tablet 3  . verapamil (CALAN-SR) 120 MG CR tablet TAKE 1 TABLET(120 MG) BY MOUTH DAILY 90 tablet 3  . verapamil (CALAN-SR) 120 MG CR tablet TAKE 1 TABLET(120 MG) BY MOUTH DAILY 90 tablet 0  . warfarin (COUMADIN) 5 MG tablet TAKE AS DIRECTED BY ANTI-COAGULATION CLINIC 90 tablet 1   No current facility-administered medications for this visit.      Allergies:   Other and Sulfa antibiotics   Social History:  The patient  reports that she has quit smoking. Her smoking use included cigarettes. She has a 40.00 pack-year smoking history. She has never used smokeless tobacco. She reports that she drinks alcohol. She reports that she does not use drugs.   Family History:   family history includes Alzheimer's disease in her mother; Brain cancer in her father; Cancer (age of onset: 57) in her brother.    Review of Systems: Review of Systems  Respiratory: Negative.  Cardiovascular: Negative.   Gastrointestinal: Negative.   Musculoskeletal: Negative.        Poor balance, pain in her feet  Neurological: Positive for weakness.  Endo/Heme/Allergies: Bruises/bleeds easily.  Psychiatric/Behavioral: Negative.   All other systems reviewed and are negative.    PHYSICAL EXAM: VS:  There were no vitals taken for this visit. , BMI There is no height or weight on file to calculate BMI.  GEN: Well nourished, well developed, in no acute distress  HEENT: normal  Neck: no JVD, carotid bruits, or masses Cardiac: irregular irregular,   no murmurs, rubs, or gallops,no edema  Respiratory:  clear to auscultation bilaterally, normal work of breathing GI: soft, nontender, nondistended, + BS MS: no deformity or atrophy  Skin: warm and dry, no rash, bruising in LE b/l Neuro:  Strength and sensation are intact Psych: euthymic mood, full affect    Recent Labs: 10/13/2017: ALT 22; BUN 12; Creatinine, Ser 0.72; Hemoglobin 15.7; Platelets 192.0; Potassium 3.8; Sodium 140    Lipid Panel Lab Results  Component Value Date   CHOL 233 (H) 10/07/2016   HDL 75.90 10/07/2016   LDLCALC 140 (H) 10/07/2016   TRIG 85.0 10/07/2016      Wt Readings from Last 3 Encounters:  10/21/17 141 lb 4 oz (64.1 kg)  10/15/17 139 lb (63 kg)  04/10/17 138 lb 12 oz (62.9 kg)       ASSESSMENT AND PLAN:  Atrial fibrillation, unspecified type ([place]) - Plan: EKG 12-Lead Rate controlled , on anticoagulation No orthostasis sx, BP low We'll continue current medications If she develops orthostasis symptoms we will decrease verapamil dosing  HLD (hyperlipidemia) Does not want a statin  Centrilobular emphysema (HCC) Stable, Some SOB with hills. Not on oxygen  Leg pain Significant problems with her feet, previously seen by podiatry Foot drop. Hammertoes among other foot issues    Total encounter time more than 25 minutes  Greater than 50% was spent in counseling and coordination of care with the patient   Disposition:   F/U  12 months   No orders of the defined types were placed in this encounter.    Signed, Esmond Plants, M.D., Ph.D. 04/11/2018  [place], [place]

## 2018-04-13 ENCOUNTER — Ambulatory Visit: Payer: PPO | Admitting: Cardiovascular Disease

## 2018-04-13 ENCOUNTER — Telehealth: Payer: Self-pay | Admitting: Cardiovascular Disease

## 2018-04-13 NOTE — Telephone Encounter (Signed)
lmov to reschedule appt with Dr. Rockey Situ

## 2018-04-14 NOTE — Telephone Encounter (Signed)
Pt rescheduled for 05/14/18   Nothing else needed.

## 2018-04-24 DIAGNOSIS — T148XXA Other injury of unspecified body region, initial encounter: Secondary | ICD-10-CM | POA: Diagnosis not present

## 2018-04-30 DIAGNOSIS — I482 Chronic atrial fibrillation: Secondary | ICD-10-CM | POA: Diagnosis not present

## 2018-05-04 ENCOUNTER — Ambulatory Visit: Payer: Self-pay

## 2018-05-04 DIAGNOSIS — I482 Chronic atrial fibrillation: Secondary | ICD-10-CM | POA: Diagnosis not present

## 2018-05-04 LAB — POCT INR
INR: 3.1 — AB (ref 2.0–3.0)
INR: 3.1 — AB (ref 2.0–3.0)

## 2018-05-05 LAB — POCT INR: INR: 3.1 — AB (ref 2.0–3.0)

## 2018-05-11 ENCOUNTER — Ambulatory Visit: Payer: Self-pay

## 2018-05-11 DIAGNOSIS — I482 Chronic atrial fibrillation: Secondary | ICD-10-CM | POA: Diagnosis not present

## 2018-05-11 LAB — POCT INR: INR: 2.1 (ref 2.0–3.0)

## 2018-05-11 NOTE — Patient Instructions (Addendum)
Email Report received from [facility] Nurse: Wilford Sports RN: 3.1  Patient is on abx, amoxicillin 4x daily though this Thursday.  Patient is to hold coumadin today (05/04/18) and then take 1/2 pill (2.5mg ) on 8/20 then resume prior dosing of 1 pill daily Recheck in 1 week after finishing abx on 05/11/18.  Notified Wilford Sports, RN who will communicate with patient.

## 2018-05-11 NOTE — Progress Notes (Signed)
I have reviewed this visit and I agree on the patient's plan of dosage and recommendations. Ian Cavey B Aman Bonet, FNP   

## 2018-05-11 NOTE — Progress Notes (Signed)
I have reviewed this visit and I agree on the patient's plan of dosage and recommendations. Deborah B Gessner, FNP   

## 2018-05-11 NOTE — Patient Instructions (Signed)
Email Report received from [facility] Nurse: Wilford Sports, RN: 2.1  Continue taking 1 (5mg ) pill daily and recheck in 2 weeks.  Notified Wilford Sports, RN at [facility] who will communicate to patient.

## 2018-05-11 NOTE — Progress Notes (Signed)
This encounter was created in error - please disregard.

## 2018-05-14 ENCOUNTER — Encounter: Payer: Self-pay | Admitting: Cardiovascular Disease

## 2018-05-14 ENCOUNTER — Ambulatory Visit: Payer: PPO | Admitting: Cardiovascular Disease

## 2018-05-14 VITALS — BP 100/60 | HR 79 | Ht 68.0 in | Wt 137.2 lb

## 2018-05-14 DIAGNOSIS — J432 Centrilobular emphysema: Secondary | ICD-10-CM | POA: Insufficient documentation

## 2018-05-14 DIAGNOSIS — I872 Venous insufficiency (chronic) (peripheral): Secondary | ICD-10-CM

## 2018-05-14 DIAGNOSIS — I482 Chronic atrial fibrillation, unspecified: Secondary | ICD-10-CM

## 2018-05-14 DIAGNOSIS — E782 Mixed hyperlipidemia: Secondary | ICD-10-CM

## 2018-05-14 NOTE — Progress Notes (Signed)
Cardiology Office Note  Date:  05/14/2018   ID:  [name], DOB 10/26/1931, MRN [id number]  PCP:  Elby Beck, FNP   Chief Complaint  Patient presents with  . other    12 month f/u no complaints today.  Meds reviewed verbally with pt.    HPI:  [name] is a 84 year old woman with  chronic atrial fibrillation, on warfarin COPD,   40 years of smoking, hyperlipidemia rate control medications  Chronic leg pain She lives at [facility] [place]. who presents for routine followup of her atrial fibrillation.   In follow-up today she reports having continued leg pain somewhat better on gabapentin Managed by neurology  Active, less exercise Does exercise 2 or 3 days a week  Previously with chronic vertigo, balance issues, legs weak Previously seen by [name], Dr. Pryor Ochoa for vertigo  Chronically low blood pressure, denies any orthostasis symptoms Blood pressure on today's visit barely 253 systolic Does not want to change her medications  EKG personally reviewed by myself on todays visit Shows atrial fibrillation ventricular rate 79 bpm no significant ST or T wave changes  Other past medical history Prior cardiac notes from Dr. Sheppard Coil in [place] indicate she has chronic atrial fibrillation,  echocardiogram in 2010 and March 2014 confirming atrial fibrillation. No old EKGs are available.  Echocardiogram from March 2014 shows normal LV systolic function, severely dilated left and right atrium, mildly elevated right ventricular systolic pressure estimated at 35 mm of mercury  PMH:   has a past medical history of Atrial fibrillation ([place]), COPD (chronic obstructive pulmonary disease) ([place]), Hypothyroidism, Microscopic colitis, Osteoporosis, Squamous cell carcinoma, and Syncope.  PSH:    Past Surgical History:  Procedure Laterality Date  . APPENDECTOMY    . CATARACT EXTRACTION Bilateral   . COLONOSCOPY    . LIPOMA EXCISION    . MULTIPLE TOOTH EXTRACTIONS    . REFRACTIVE  SURGERY    . SQUAMOUS CELL CARCINOMA EXCISION  07/10/2017   left shin    Current Outpatient Medications  Medication Sig Dispense Refill  . ADVAIR DISKUS 100-50 MCG/DOSE AEPB USE 1 INHALATION BY MOUTH TWICE DAILY 180 each 2  . Cholecalciferol (VITAMIN D-3) 1000 UNITS CAPS Take 1,000 Units by mouth every other day.     Marland Kitchen DIGOX 125 MCG tablet TAKE 1 TABLET BY MOUTH EVERY DAY 90 tablet 0  . gabapentin (NEURONTIN) 100 MG capsule Take 100 mg by mouth. Pt takes 2 in the am and 2 at night    . levothyroxine (SYNTHROID, LEVOTHROID) 50 MCG tablet TAKE 1 TABLET BY MOUTH DAILY, THEN 1/2 TABLET THE NEXT DAY ALTERNATING 68 tablet 3  . verapamil (CALAN-SR) 120 MG CR tablet TAKE 1 TABLET(120 MG) BY MOUTH DAILY 90 tablet 0  . warfarin (COUMADIN) 5 MG tablet TAKE AS DIRECTED BY ANTI-COAGULATION CLINIC 90 tablet 1   No current facility-administered medications for this visit.      Allergies:   Other and Sulfa antibiotics   Social History:  The patient  reports that she has quit smoking. Her smoking use included cigarettes. She has a 40.00 pack-year smoking history. She has never used smokeless tobacco. She reports that she drinks alcohol. She reports that she does not use drugs.   Family History:   family history includes Alzheimer's disease in her mother; Brain cancer in her father; Cancer (age of onset: 1) in her brother.    Review of Systems: Review of Systems  Respiratory: Negative.   Cardiovascular: Negative.   Gastrointestinal:  Negative.   Musculoskeletal: Negative.        Poor balance, pain in her feet  Neurological: Positive for weakness.  Endo/Heme/Allergies: Bruises/bleeds easily.  Psychiatric/Behavioral: Negative.   All other systems reviewed and are negative.    PHYSICAL EXAM: VS:  BP (!) 84/60 (BP Location: Left Arm, Patient Position: Sitting, Cuff Size: Normal)   Pulse 79   Ht 5\' 8"  (1.727 m)   Wt 137 lb 4 oz (62.3 kg)   BMI 20.87 kg/m  , BMI Body mass index is 20.87  kg/m.  Constitutional:  oriented to person, place, and time. No distress. Very thin HENT:  Head: Normocephalic and atraumatic.  Eyes:  no discharge. No scleral icterus.  Neck: Normal range of motion. Neck supple. No JVD present.  Cardiovascular: Normal rate, regular rhythm, normal heart sounds and intact distal pulses. Exam reveals no gallop and no friction rub. No edema No murmur heard. Pulmonary/Chest: Effort normal and breath sounds normal. No stridor. No respiratory distress.  no wheezes.  no rales.  no tenderness.  Abdominal: Soft.  no distension.  no tenderness.  Musculoskeletal: Normal range of motion.  no  tenderness or deformity.  Neurological:  normal muscle tone. Coordination normal. No atrophy Skin: Skin is warm and dry. No rash noted. not diaphoretic.  Psychiatric:  normal mood and affect. behavior is normal. Thought content normal.   Recent Labs: 10/13/2017: ALT 22; BUN 12; Creatinine, Ser 0.72; Hemoglobin 15.7; Platelets 192.0; Potassium 3.8; Sodium 140    Lipid Panel Lab Results  Component Value Date   CHOL 233 (H) 10/07/2016   HDL 75.90 10/07/2016   LDLCALC 140 (H) 10/07/2016   TRIG 85.0 10/07/2016      Wt Readings from Last 3 Encounters:  05/14/18 137 lb 4 oz (62.3 kg)  10/21/17 141 lb 4 oz (64.1 kg)  10/15/17 139 lb (63 kg)     ASSESSMENT AND PLAN:  Atrial fibrillation, unspecified type ([place]) - Plan: EKG 12-Lead Rate controlled , on anticoagulation No orthostasis sx,  We'll continue current medications If she develops orthostasis symptoms we will decrease verapamil dosing  HLD (hyperlipidemia) Currently not on cholesterol medication  Centrilobular emphysema (HCC) Stable, No recent COPD exacerbation  Leg pain Significant problems with her feet, previously seen by podiatry and neurology  Foot drop. Hammertoes  On Neurontin    Total encounter time more than 25 minutes  Greater than 50% was spent in counseling and coordination of care with the  patient   Disposition:   F/U  12 months   No orders of the defined types were placed in this encounter.    Signed, Esmond Plants, M.D., Ph.D. 05/14/2018  [place], [place]

## 2018-05-14 NOTE — Patient Instructions (Addendum)
Medication Instructions:   No medication changes made  Consider changing to xarelto one a day instead of warfarin Or eliquis twice a day instead of warfarin  Labwork:  No new labs needed  Testing/Procedures:  No further testing at this time   Follow-Up: It was a pleasure seeing you in the office today. Please call us if you have new issues that need to be addressed before your next appt.  [phone]  Your physician wants you to follow-up in: 12 months.  You will receive a reminder letter in the mail two months in advance. If you don't receive a letter, please call our office to schedule the follow-up appointment.  If you need a refill on your cardiac medications before your next appointment, please call your pharmacy.  For educational health videos Log in to : [email or web address] Or : [email or web address], password : [email or web address]

## 2018-05-25 ENCOUNTER — Ambulatory Visit: Payer: Self-pay

## 2018-05-25 DIAGNOSIS — I482 Chronic atrial fibrillation: Secondary | ICD-10-CM | POA: Diagnosis not present

## 2018-05-25 LAB — POCT INR: INR: 3.3 — AB (ref 2.0–3.0)

## 2018-05-25 NOTE — Patient Instructions (Signed)
Email Report received from [facility] Nurse: Wilford Sports, RN: 3.3  Have patient hold her coumadin today and then decrease weekly dose to taking 1 (5mg ) pill daily EXCEPT for 1/2 pill (2.5mg ) on Wednesdays and recheck in 2 weeks.  Notified Wilford Sports, RN at [facility] who will communicate to patient.

## 2018-06-02 ENCOUNTER — Other Ambulatory Visit: Payer: Self-pay | Admitting: Family Medicine

## 2018-06-02 ENCOUNTER — Other Ambulatory Visit: Payer: Self-pay | Admitting: Cardiovascular Disease

## 2018-06-04 NOTE — Telephone Encounter (Signed)
Patient is compliant with coumadin management will refill X 6 months.   

## 2018-06-05 NOTE — Progress Notes (Signed)
I have reviewed this visit and I agree on the patient's plan of dosage and recommendations. Lulani Bour B Leslyn Monda, FNP   

## 2018-06-08 ENCOUNTER — Ambulatory Visit: Payer: Self-pay

## 2018-06-08 DIAGNOSIS — I482 Chronic atrial fibrillation: Secondary | ICD-10-CM | POA: Diagnosis not present

## 2018-06-08 LAB — POCT INR: INR: 2.3 (ref 2.0–3.0)

## 2018-06-12 NOTE — Progress Notes (Signed)
I have reviewed this visit and I agree on the patient's plan of dosage and recommendations. Tesha Archambeau B Esmay Amspacher, FNP   

## 2018-06-12 NOTE — Patient Instructions (Signed)
Email Report received from [facility] Nurse: Wilford Sports, RN: 2.3  Spoke with Wilford Sports, RN on 9/23 and gave the following instructions:   Patient is to continue taking 1 (5mg ) pill daily EXCEPT for 1/2 pill (2.5mg ) on Wednesdays and recheck in 4 weeks.  Notified Wilford Sports, RN at [facility] who will communicate to patient.

## 2018-06-16 DIAGNOSIS — G629 Polyneuropathy, unspecified: Secondary | ICD-10-CM | POA: Diagnosis not present

## 2018-06-16 DIAGNOSIS — M21371 Foot drop, right foot: Secondary | ICD-10-CM | POA: Diagnosis not present

## 2018-06-16 DIAGNOSIS — M21372 Foot drop, left foot: Secondary | ICD-10-CM | POA: Diagnosis not present

## 2018-07-06 ENCOUNTER — Ambulatory Visit: Payer: Self-pay

## 2018-07-06 DIAGNOSIS — I482 Chronic atrial fibrillation, unspecified: Secondary | ICD-10-CM | POA: Diagnosis not present

## 2018-07-06 LAB — POCT INR: INR: 2.5 (ref 2.0–3.0)

## 2018-07-10 NOTE — Patient Instructions (Signed)
Email Report received from [facility] Nurse: Wilford Sports, RN: 2.5  Spoke with Wilford Sports, RN on 10/21 and gave the following instructions:   Patient is to continue taking 1 (5mg ) pill daily EXCEPT for 1/2 pill (2.5mg ) on Wednesdays and recheck in 4 weeks.  Notified Wilford Sports, RN at [facility] who will communicate to patient.

## 2018-07-13 NOTE — Progress Notes (Signed)
I have reviewed this visit and I agree on the patient's plan of dosage and recommendations. Baila Rouse B Vennie Waymire, FNP   

## 2018-07-15 ENCOUNTER — Telehealth: Payer: Self-pay | Admitting: Family Medicine

## 2018-07-15 NOTE — Telephone Encounter (Signed)
Pt want to let us know she had her flu shot 10.17.19 at [facility]

## 2018-07-20 NOTE — Telephone Encounter (Signed)
Updated in system

## 2018-08-03 ENCOUNTER — Ambulatory Visit: Payer: Self-pay

## 2018-08-03 DIAGNOSIS — I482 Chronic atrial fibrillation, unspecified: Secondary | ICD-10-CM | POA: Diagnosis not present

## 2018-08-03 LAB — POCT INR: INR: 2.4 (ref 2.0–3.0)

## 2018-08-07 NOTE — Patient Instructions (Signed)
Email Report received from [facility] Nurse: Wilford Sports, RN: 2.4  Spoke with Wilford Sports, RN on 11/18 and gave the following instructions:   Patient is to continue taking 1 (5mg ) pill daily EXCEPT for 1/2 pill (2.5mg ) on Wednesdays and recheck in 4 weeks.  Notified Wilford Sports, RN at [facility] who will communicate to patient.

## 2018-08-09 NOTE — Progress Notes (Signed)
I have reviewed this visit and I agree on the patient's plan of dosage and recommendations. Deborah B Gessner, FNP   

## 2018-08-18 DIAGNOSIS — Z961 Presence of intraocular lens: Secondary | ICD-10-CM | POA: Diagnosis not present

## 2018-08-31 ENCOUNTER — Ambulatory Visit: Payer: Self-pay

## 2018-08-31 DIAGNOSIS — I482 Chronic atrial fibrillation, unspecified: Secondary | ICD-10-CM | POA: Diagnosis not present

## 2018-08-31 LAB — POCT INR: INR: 2.3 (ref 2.0–3.0)

## 2018-09-01 LAB — POCT INR: INR: 2.3 (ref 2.0–3.0)

## 2018-09-01 NOTE — Patient Instructions (Signed)
Email Report received from [facility] Nurse: Wilford Sports, RN: 2.3  Spoke with Wilford Sports, RN on 12/16 and gave the following instructions:   Patient is to continue taking 1 (5mg ) pill daily EXCEPT for 1/2 pill (2.5mg ) on Wednesdays and recheck in 4 weeks.  Notified Wilford Sports, RN at [facility] who will communicate to patient.

## 2018-09-05 NOTE — Progress Notes (Signed)
I have reviewed this visit and I agree on the patient's plan of dosage and recommendations. Deborah B Gessner, FNP   

## 2018-09-11 ENCOUNTER — Other Ambulatory Visit: Payer: Self-pay | Admitting: Cardiovascular Disease

## 2018-09-28 ENCOUNTER — Ambulatory Visit: Payer: Self-pay

## 2018-09-28 DIAGNOSIS — I482 Chronic atrial fibrillation, unspecified: Secondary | ICD-10-CM | POA: Diagnosis not present

## 2018-09-28 LAB — POCT INR: INR: 1.6 — AB (ref 2.0–3.0)

## 2018-09-28 NOTE — Progress Notes (Signed)
I have reviewed this visit and I agree on the patient's plan of dosage and recommendations. Deborah B Gessner, FNP   

## 2018-09-28 NOTE — Patient Instructions (Signed)
Email Report received from [facility] Nurse: Wilford Sports, RN: 1.6  Spoke with Wilford Sports, RN on 09/28/18 and gave the following instructions: Take 7.5mg  today (1/13 and tomorrow 1/14) and then resume prior dosing of 1 (5mg ) pill daily EXCEPT for 1/2 pill (2.5mg ) on Wednesdays and recheck in 2 weeks.  Notified Wilford Sports, RN at [facility] who will communicate to patient.    Patient denies any changes to diet, health or medications.  She is aware of risks associated with subtherapeutic level and will go to ER if any concerns develop.

## 2018-10-01 DIAGNOSIS — Z08 Encounter for follow-up examination after completed treatment for malignant neoplasm: Secondary | ICD-10-CM | POA: Diagnosis not present

## 2018-10-01 DIAGNOSIS — Z85828 Personal history of other malignant neoplasm of skin: Secondary | ICD-10-CM | POA: Diagnosis not present

## 2018-10-01 DIAGNOSIS — L821 Other seborrheic keratosis: Secondary | ICD-10-CM | POA: Diagnosis not present

## 2018-10-12 ENCOUNTER — Ambulatory Visit: Payer: Self-pay

## 2018-10-12 DIAGNOSIS — I482 Chronic atrial fibrillation, unspecified: Secondary | ICD-10-CM | POA: Diagnosis not present

## 2018-10-12 LAB — POCT INR: INR: 2.3 (ref 2.0–3.0)

## 2018-10-14 NOTE — Patient Instructions (Signed)
Email Report received from [facility] Nurse: Wilford Sports, RN: 2.3  Spoke with Wilford Sports, RN on 10/12/18 and gave the following instructions: Continue taking 1 (5mg ) pill daily EXCEPT for 1/2 pill (2.5mg ) on Wednesdays and recheck in 4 weeks.  Notified Wilford Sports, RN at [facility] who will communicate to patient.    Patient denies any changes to diet, health or medications.

## 2018-10-17 NOTE — Progress Notes (Signed)
I have reviewed this visit and I agree on the patient's plan of dosage and recommendations. Yaman Grauberger B Markise Haymer, FNP   

## 2018-10-26 ENCOUNTER — Telehealth: Payer: Self-pay

## 2018-10-26 DIAGNOSIS — Z5181 Encounter for therapeutic drug level monitoring: Secondary | ICD-10-CM

## 2018-10-26 DIAGNOSIS — Z79899 Other long term (current) drug therapy: Secondary | ICD-10-CM

## 2018-10-26 DIAGNOSIS — E039 Hypothyroidism, unspecified: Secondary | ICD-10-CM

## 2018-10-26 DIAGNOSIS — E782 Mixed hyperlipidemia: Secondary | ICD-10-CM

## 2018-10-26 NOTE — Telephone Encounter (Signed)
CPE labs ordered 

## 2018-10-27 ENCOUNTER — Ambulatory Visit (INDEPENDENT_AMBULATORY_CARE_PROVIDER_SITE_OTHER): Payer: PPO

## 2018-10-27 ENCOUNTER — Ambulatory Visit: Payer: PPO

## 2018-10-27 VITALS — BP 98/76 | HR 81 | Temp 97.4°F | Ht 69.0 in | Wt 135.5 lb

## 2018-10-27 DIAGNOSIS — E039 Hypothyroidism, unspecified: Secondary | ICD-10-CM | POA: Diagnosis not present

## 2018-10-27 DIAGNOSIS — Z0000 Encounter for general adult medical examination without abnormal findings: Secondary | ICD-10-CM | POA: Diagnosis not present

## 2018-10-27 DIAGNOSIS — Z79899 Other long term (current) drug therapy: Secondary | ICD-10-CM

## 2018-10-27 DIAGNOSIS — Z5181 Encounter for therapeutic drug level monitoring: Secondary | ICD-10-CM | POA: Diagnosis not present

## 2018-10-27 DIAGNOSIS — E782 Mixed hyperlipidemia: Secondary | ICD-10-CM | POA: Diagnosis not present

## 2018-10-27 LAB — LIPID PANEL
Cholesterol: 203 mg/dL — ABNORMAL HIGH (ref 0–200)
HDL: 67.4 mg/dL (ref >39.00)
LDL Cholesterol: 109 mg/dL — ABNORMAL HIGH (ref 0–99)
NonHDL: 135.54
TRIGLYCERIDES: 135 mg/dL (ref 0.0–149.0)
Total CHOL/HDL Ratio: 3
VLDL: 27 mg/dL (ref 0.0–40.0)

## 2018-10-27 LAB — CBC WITH DIFFERENTIAL/PLATELET
Basophils Absolute: 0.1 10*3/uL (ref 0.0–0.1)
Basophils Relative: 1.1 % (ref 0.0–3.0)
Eosinophils Absolute: 0.1 10*3/uL (ref 0.0–0.7)
Eosinophils Relative: 2.3 % (ref 0.0–5.0)
HEMATOCRIT: 46.7 % — AB (ref 36.0–46.0)
Hemoglobin: 15.5 g/dL — ABNORMAL HIGH (ref 12.0–15.0)
Lymphocytes Relative: 29.2 % (ref 12.0–46.0)
Lymphs Abs: 1.5 10*3/uL (ref 0.7–4.0)
MCHC: 33.1 g/dL (ref 30.0–36.0)
MCV: 93.3 fl (ref 78.0–100.0)
MONOS PCT: 8.7 % (ref 3.0–12.0)
Monocytes Absolute: 0.5 10*3/uL (ref 0.1–1.0)
Neutro Abs: 3.1 10*3/uL (ref 1.4–7.7)
Neutrophils Relative %: 58.7 % (ref 43.0–77.0)
Platelets: 194 10*3/uL (ref 150.0–400.0)
RBC: 5 Mil/uL (ref 3.87–5.11)
RDW: 14 % (ref 11.5–15.5)
WBC: 5.3 10*3/uL (ref 4.0–10.5)

## 2018-10-27 LAB — COMPREHENSIVE METABOLIC PANEL
ALT: 21 U/L (ref 0–35)
AST: 30 U/L (ref 0–37)
Albumin: 4.2 g/dL (ref 3.5–5.2)
Alkaline Phosphatase: 77 U/L (ref 39–117)
BUN: 12 mg/dL (ref 6–23)
CALCIUM: 10.3 mg/dL (ref 8.4–10.5)
CO2: 29 mEq/L (ref 19–32)
Chloride: 101 mEq/L (ref 96–112)
Creatinine, Ser: 0.76 mg/dL (ref 0.40–1.20)
GFR: 72.35 mL/min (ref >60.00)
Glucose, Bld: 81 mg/dL (ref 70–99)
Potassium: 4.6 mEq/L (ref 3.5–5.1)
Sodium: 138 mEq/L (ref 135–145)
Total Bilirubin: 0.7 mg/dL (ref 0.2–1.2)
Total Protein: 7.1 g/dL (ref 6.0–8.3)

## 2018-10-27 LAB — TSH: TSH: 2.93 u[IU]/mL (ref 0.35–4.50)

## 2018-10-27 NOTE — Patient Instructions (Signed)
Ms. Doan , Thank you for taking time to come for your Medicare Wellness Visit. I appreciate your ongoing commitment to your health goals. Please review the following plan we discussed and let me know if I can assist you in the future.   These are the goals we discussed: Goals    . Increase physical activity     Starting 10/27/2018, I will continue to exercise for at least 30 minutes 6 days per week.        This is a list of the screening recommended for you and due dates:  Health Maintenance  Topic Date Due  . Tetanus Vaccine  04/29/2024*  . Flu Shot  Completed  . DEXA scan (bone density measurement)  Completed  . Pneumonia vaccines  Completed  *Topic was postponed. The date shown is not the original due date.    Preventive Care for Adults  A healthy lifestyle and preventive care can promote health and wellness. Preventive health guidelines for adults include the following key practices.  . A routine yearly physical is a good way to check with your health care provider about your health and preventive screening. It is a chance to share any concerns and updates on your health and to receive a thorough exam.  . Visit your dentist for a routine exam and preventive care every 6 months. Brush your teeth twice a day and floss once a day. Good oral hygiene prevents tooth decay and gum disease.  . The frequency of eye exams is based on your age, health, family medical history, use  of contact lenses, and other factors. Follow your health care provider's recommendations for frequency of eye exams.  . Eat a healthy diet. Foods like vegetables, fruits, whole grains, low-fat dairy products, and lean protein foods contain the nutrients you need without too many calories. Decrease your intake of foods high in solid fats, added sugars, and salt. Eat the right amount of calories for you. Get information about a proper diet from your health care provider, if necessary.  . Regular physical exercise  is one of the most important things you can do for your health. Most adults should get at least 150 minutes of moderate-intensity exercise (any activity that increases your heart rate and causes you to sweat) each week. In addition, most adults need muscle-strengthening exercises on 2 or more days a week.  Silver Sneakers may be a benefit available to you. To determine eligibility, you may visit the website: www.silversneakers.com or contact program at 775-410-3646 Mon-Fri between 8AM-8PM.   . Maintain a healthy weight. The body mass index (BMI) is a screening tool to identify possible weight problems. It provides an estimate of body fat based on height and weight. Your health care provider can find your BMI and can help you achieve or maintain a healthy weight.   For adults 20 years and older: ? A BMI below 18.5 is considered underweight. ? A BMI of 18.5 to 24.9 is normal. ? A BMI of 25 to 29.9 is considered overweight. ? A BMI of 30 and above is considered obese.   . Maintain normal blood lipids and cholesterol levels by exercising and minimizing your intake of saturated fat. Eat a balanced diet with plenty of fruit and vegetables. Blood tests for lipids and cholesterol should begin at age 36 and be repeated every 5 years. If your lipid or cholesterol levels are high, you are over 50, or you are at high risk for heart disease, you may need  your cholesterol levels checked more frequently. Ongoing high lipid and cholesterol levels should be treated with medicines if diet and exercise are not working.  . If you smoke, find out from your health care provider how to quit. If you do not use tobacco, please do not start.  . If you choose to drink alcohol, please do not consume more than 2 drinks per day. One drink is considered to be 12 ounces (355 mL) of beer, 5 ounces (148 mL) of wine, or 1.5 ounces (44 mL) of liquor.  . If you are 60-71 years old, ask your health care provider if you should take  aspirin to prevent strokes.  . Use sunscreen. Apply sunscreen liberally and repeatedly throughout the day. You should seek shade when your shadow is shorter than you. Protect yourself by wearing long sleeves, pants, a wide-brimmed hat, and sunglasses year round, whenever you are outdoors.  . Once a month, do a whole body skin exam, using a mirror to look at the skin on your back. Tell your health care provider of new moles, moles that have irregular borders, moles that are larger than a pencil eraser, or moles that have changed in shape or color.

## 2018-10-27 NOTE — Progress Notes (Signed)
Subjective:   [name] is a 85 y.o. female who presents for Medicare Annual (Subsequent) preventive examination.  Review of Systems:  N/A Cardiac Risk Factors include: advanced age (>72men, >88 women);dyslipidemia     Objective:     Vitals: BP 98/76 (BP Location: Left Arm, Patient Position: Sitting, Cuff Size: Normal)   Pulse 81   Temp (!) 97.4 F (36.3 C) (Oral)   Ht 5\' 9"  (1.753 m) Comment: shoes  Wt 135 lb 8 oz (61.5 kg)   SpO2 95%   BMI 20.01 kg/m   Body mass index is 20.01 kg/m.  Advanced Directives 10/27/2018 10/21/2017 01/31/2017 12/16/2016 10/07/2016  Does Patient Have a Medical Advance Directive? Yes Yes Yes Yes Yes  Type of Paramedic of [place];Living will [place];Living will [place];Living will [place];Living will [place];Living will  Copy of [place] in Chart? No - copy requested No - copy requested - - No - copy requested    Tobacco Social History   Tobacco Use  Smoking Status Former Smoker  . Packs/day: 1.00  . Years: 40.00  . Pack years: 40.00  . Types: Cigarettes  Smokeless Tobacco Never Used     Counseling given: No   Clinical Intake:  Pre-visit preparation completed: Yes  Pain : No/denies pain Pain Score: 0-No pain     Nutritional Status: BMI 25 -29 Overweight Nutritional Risks: None  How often do you need to have someone help you when you read instructions, pamphlets, or other written materials from your doctor or pharmacy?: 1 - Never What is the last grade level you completed in school?: 12th grade + 2 ys college  Interpreter Needed?: No  Comments: pt is a widow and lives alone Information entered by :: LPinson, LPN  Past Medical History:  Diagnosis Date  . Atrial fibrillation ([place])   . COPD (chronic obstructive pulmonary disease) ([place])   . Hypothyroidism   . Microscopic colitis   . Osteoporosis     . Squamous cell carcinoma   . Syncope    Past Surgical History:  Procedure Laterality Date  . APPENDECTOMY    . CATARACT EXTRACTION Bilateral   . COLONOSCOPY    . LIPOMA EXCISION    . MULTIPLE TOOTH EXTRACTIONS    . REFRACTIVE SURGERY    . SQUAMOUS CELL CARCINOMA EXCISION  07/10/2017   left shin   Family History  Problem Relation Age of Onset  . Alzheimer's disease Mother   . Brain cancer Father   . Cancer Brother 62       bladder   Social History   Socioeconomic History  . Marital status: Widowed    Spouse name: Not on file  . Number of children: Not on file  . Years of education: Not on file  . Highest education level: Not on file  Occupational History  . Not on file  Social Needs  . Financial resource strain: Not on file  . Food insecurity:    Worry: Not on file    Inability: Not on file  . Transportation needs:    Medical: Not on file    Non-medical: Not on file  Tobacco Use  . Smoking status: Former Smoker    Packs/day: 1.00    Years: 40.00    Pack years: 40.00    Types: Cigarettes  . Smokeless tobacco: Never Used  Substance and Sexual Activity  . Alcohol use: Yes  Comment: social  . Drug use: No  . Sexual activity: Never  Lifestyle  . Physical activity:    Days per week: Not on file    Minutes per session: Not on file  . Stress: Not on file  Relationships  . Social connections:    Talks on phone: Not on file    Gets together: Not on file    Attends religious service: Not on file    Active member of club or organization: Not on file    Attends meetings of clubs or organizations: Not on file    Relationship status: Not on file  Other Topics Concern  . Not on file  Social History Narrative   Recently moved to [facility] 03/2013 from [place].   Husband died in Nov 10, 2011.      Son (adopted) lives in [place], [place].  Two grandchildren- 52, 69 yo.   DNR- forms signed and returned to patient.             Outpatient Encounter Medications as of  10/27/2018  Medication Sig  . ADVAIR DISKUS 100-50 MCG/DOSE AEPB USE 1 INHALATION BY MOUTH TWICE DAILY  . Cholecalciferol (VITAMIN D-3) 1000 UNITS CAPS Take 1,000 Units by mouth every other day.   . digoxin (LANOXIN) 0.125 MG tablet TAKE 1 TABLET BY MOUTH EVERY DAY  . gabapentin (NEURONTIN) 100 MG capsule Take 100 mg by mouth. Pt takes 2 in the am and 2 at night  . levothyroxine (SYNTHROID, LEVOTHROID) 50 MCG tablet TAKE 1 TABLET BY MOUTH DAILY, THEN 1/2 TABLET THE NEXT DAY ALTERNATING  . verapamil (CALAN-SR) 120 MG CR tablet TAKE 1 TABLET(120 MG) BY MOUTH DAILY  . warfarin (COUMADIN) 5 MG tablet TAKE AS DIRECTED BY ANTI-COAGULATION CLINIC   No facility-administered encounter medications on file as of 10/27/2018.     Activities of Daily Living In your present state of health, do you have any difficulty performing the following activities: 10/27/2018  Hearing? N  Vision? N  Difficulty concentrating or making decisions? N  Walking or climbing stairs? N  Dressing or bathing? N  Doing errands, shopping? N  Preparing Food and eating ? N  Using the Toilet? N  In the past six months, have you accidently leaked urine? N  Do you have problems with loss of bowel control? N  Managing your Medications? N  Managing your Finances? N  Housekeeping or managing your Housekeeping? N  Some recent data might be hidden    Patient Care Team: Elby Beck, FNP as PCP - General (Nurse Practitioner) Oneta Rack, MD (Dermatology) Carloyn Manner, MD as Referring Physician (Otolaryngology) Estill Cotta, MD as Consulting Physician (Ophthalmology) Minna Merritts, MD as Consulting Physician (Cardiology) Josefine Class, MD as Referring Physician (Gastroenterology) Oneta Rack, MD (Dermatology)    Assessment:   This is a routine wellness examination for [name].   Hearing Screening   125Hz  250Hz  500Hz  1000Hz  2000Hz  3000Hz  4000Hz  6000Hz  8000Hz   Right ear:   0 0 40  0      Left ear:   0 40 40  40    Vision Screening Comments: Vision exam in December 2019 with Dr. Sandra Cockayne   Exercise Activities and Dietary recommendations Current Exercise Habits: The patient does not participate in regular exercise at present, Exercise limited by: None identified  Goals    . Increase physical activity     Starting 10/27/2018, I will continue to exercise for at least 30 minutes 6 days per week.  Fall Risk Fall Risk  10/27/2018 10/21/2017 10/15/2017 10/07/2016 10/03/2015  Falls in the past year? 0 No No No No   Depression Screen PHQ 2/9 Scores 10/27/2018 10/21/2017 10/15/2017 10/07/2016  PHQ - 2 Score 0 0 0 0  PHQ- 9 Score 0 0 - -     Cognitive Function MMSE - Mini Mental State Exam 10/27/2018 10/21/2017 10/07/2016  Orientation to time 5 5 5   Orientation to Place 5 5 5   Registration 3 3 3   Attention/ Calculation 0 0 0  Recall 3 3 3   Language- name 2 objects 0 0 0  Language- repeat 1 1 1   Language- follow 3 step command 3 3 3   Language- read & follow direction 0 0 0  Write a sentence 0 0 0  Copy design 0 0 0  Total score 20 20 20      PLEASE NOTE: A Mini-Cog screen was completed. Maximum score is 20. A value of 0 denotes this part of [place] MMSE was not completed or the patient failed this part of the Mini-Cog screening.   Mini-Cog Screening Orientation to Time - Max 5 pts Orientation to Place - Max 5 pts Registration - Max 3 pts Recall - Max 3 pts Language Repeat - Max 1 pts Language Follow 3 Step Command - Max 3 pts     Immunization History  Administered Date(s) Administered  . DTaP 04/30/2004  . Influenza, High Dose Seasonal PF 06/17/2014  . Influenza,inj,Quad PF,6+ Mos 06/22/2013  . Influenza-Unspecified 06/26/2015, 06/25/2017  . Pneumococcal Conjugate-13 09/29/2014  . Pneumococcal Polysaccharide-23 06/01/2012  . Varicella 08/06/2007    Screening Tests Health Maintenance  Topic Date Due  . TETANUS/TDAP  04/29/2024 (Originally 04/30/2014)  .  INFLUENZA VACCINE  Completed  . DEXA SCAN  Completed  . PNA vac Low Risk Adult  Completed       Plan:     I have personally reviewed, addressed, and noted the following in the patient's chart:  A. Medical and social history B. Use of alcohol, tobacco or illicit drugs  C. Current medications and supplements D. Functional ability and status E.  Nutritional status F.  Physical activity G. Advance directives H. List of other physicians I.  Hospitalizations, surgeries, and ER visits in previous 12 months J.  [place] to include hearing, vision, cognitive, depression L. Referrals and appointments - none  In addition, I have reviewed and discussed with patient certain preventive protocols, quality metrics, and best practice recommendations. A written personalized care plan for preventive services as well as general preventive health recommendations were provided to patient.  See attached scanned questionnaire for additional information.   Signed,   Lindell Noe, MHA, BS, LPN Health Coach

## 2018-10-27 NOTE — Progress Notes (Signed)
PCP notes:   Health maintenance:  No gaps identified.  Abnormal screenings:   Hearing - failed  Hearing Screening   125Hz  250Hz  500Hz  1000Hz  2000Hz  3000Hz  4000Hz  6000Hz  8000Hz   Right ear:   0 0 40  0    Left ear:   0 40 40  40     Patient concerns:   None  Nurse concerns:  None  Next PCP appt:   11/04/18 @ 1430

## 2018-10-28 LAB — DIGOXIN LEVEL: Digoxin Level: 0.5 mcg/L — ABNORMAL LOW (ref 0.8–2.0)

## 2018-10-29 NOTE — Progress Notes (Signed)
I reviewed health advisor's note, was available for consultation, and agree with documentation and plan.  

## 2018-11-04 ENCOUNTER — Ambulatory Visit (INDEPENDENT_AMBULATORY_CARE_PROVIDER_SITE_OTHER): Payer: PPO | Admitting: Family Medicine

## 2018-11-04 ENCOUNTER — Encounter: Payer: Self-pay | Admitting: Family Medicine

## 2018-11-04 VITALS — BP 96/62 | HR 80 | Temp 97.5°F | Resp 10 | Ht 69.0 in | Wt 134.5 lb

## 2018-11-04 DIAGNOSIS — G8929 Other chronic pain: Secondary | ICD-10-CM | POA: Diagnosis not present

## 2018-11-04 DIAGNOSIS — M25562 Pain in left knee: Secondary | ICD-10-CM | POA: Diagnosis not present

## 2018-11-04 DIAGNOSIS — N3941 Urge incontinence: Secondary | ICD-10-CM | POA: Diagnosis not present

## 2018-11-04 DIAGNOSIS — K439 Ventral hernia without obstruction or gangrene: Secondary | ICD-10-CM

## 2018-11-04 DIAGNOSIS — R531 Weakness: Secondary | ICD-10-CM | POA: Diagnosis not present

## 2018-11-04 DIAGNOSIS — R2689 Other abnormalities of gait and mobility: Secondary | ICD-10-CM

## 2018-11-04 DIAGNOSIS — Z0000 Encounter for general adult medical examination without abnormal findings: Secondary | ICD-10-CM | POA: Diagnosis not present

## 2018-11-04 LAB — POC URINALSYSI DIPSTICK (AUTOMATED)
Bilirubin, UA: NEGATIVE
Blood, UA: NEGATIVE
Glucose, UA: NEGATIVE
Ketones, UA: NEGATIVE
Leukocytes, UA: NEGATIVE
Nitrite, UA: NEGATIVE
PROTEIN UA: NEGATIVE
Spec Grav, UA: 1.02 (ref 1.010–1.025)
Urobilinogen, UA: 0.2 E.U./dL
pH, UA: 5.5 (ref 5.0–8.0)

## 2018-11-04 NOTE — Patient Instructions (Addendum)
Good to see you today  I will put in physical therapy order   I will notify you of your urine results   Health Maintenance After Age 83 After age 2, you are at a higher risk for certain long-term diseases and infections as well as injuries from falls. Falls are a major cause of broken bones and head injuries in people who are older than age 42. Getting regular preventive care can help to keep you healthy and well. Preventive care includes getting regular testing and making lifestyle changes as recommended by your health care provider. Talk with your health care provider about:  Which screenings and tests you should have. A screening is a test that checks for a disease when you have no symptoms.  A diet and exercise plan that is right for you. What should I know about screenings and tests to prevent falls? Screening and testing are the best ways to find a health problem early. Early diagnosis and treatment give you the best chance of managing medical conditions that are common after age 57. Certain conditions and lifestyle choices may make you more likely to have a fall. Your health care provider may recommend:  Regular vision checks. Poor vision and conditions such as cataracts can make you more likely to have a fall. If you wear glasses, make sure to get your prescription updated if your vision changes.  Medicine review. Work with your health care provider to regularly review all of the medicines you are taking, including over-the-counter medicines. Ask your health care provider about any side effects that may make you more likely to have a fall. Tell your health care provider if any medicines that you take make you feel dizzy or sleepy.  Osteoporosis screening. Osteoporosis is a condition that causes the bones to get weaker. This can make the bones weak and cause them to break more easily.  Blood pressure screening. Blood pressure changes and medicines to control blood pressure can make you  feel dizzy.  Strength and balance checks. Your health care provider may recommend certain tests to check your strength and balance while standing, walking, or changing positions.  Foot health exam. Foot pain and numbness, as well as not wearing proper footwear, can make you more likely to have a fall.  Depression screening. You may be more likely to have a fall if you have a fear of falling, feel emotionally low, or feel unable to do activities that you used to do.  Alcohol use screening. Using too much alcohol can affect your balance and may make you more likely to have a fall. What actions can I take to lower my risk of falls? General instructions  Talk with your health care provider about your risks for falling. Tell your health care provider if: ? You fall. Be sure to tell your health care provider about all falls, even ones that seem minor. ? You feel dizzy, sleepy, or off-balance.  Take over-the-counter and prescription medicines only as told by your health care provider. These include any supplements.  Eat a healthy diet and maintain a healthy weight. A healthy diet includes low-fat dairy products, low-fat (lean) meats, and fiber from whole grains, beans, and lots of fruits and vegetables. Home safety  Remove any tripping hazards, such as rugs, cords, and clutter.  Install safety equipment such as grab bars in bathrooms and safety rails on stairs.  Keep rooms and walkways well-lit. Activity   Follow a regular exercise program to stay fit. This will  help you maintain your balance. Ask your health care provider what types of exercise are appropriate for you.  If you need a cane or walker, use it as recommended by your health care provider.  Wear supportive shoes that have nonskid soles. Lifestyle  Do not drink alcohol if your health care provider tells you not to drink.  If you drink alcohol, limit how much you have: ? 0-1 drink a day for women. ? 0-2 drinks a day for  men.  Be aware of how much alcohol is in your drink. In the [place] one drink equals one typical bottle of beer (12 oz), one-half glass of wine (5 oz), or one shot of hard liquor (1 oz).  Do not use any products that contain nicotine or tobacco, such as cigarettes and e-cigarettes. If you need help quitting, ask your health care provider. Summary  Having a healthy lifestyle and getting preventive care can help to protect your health and wellness after age 44.  Screening and testing are the best way to find a health problem early and help you avoid having a fall. Early diagnosis and treatment give you the best chance for managing medical conditions that are more common for people who are older than age 93.  Falls are a major cause of broken bones and head injuries in people who are older than age 41. Take precautions to prevent a fall at home.  Work with your health care provider to learn what changes you can make to improve your health and wellness and to prevent falls. This information is not intended to replace advice given to you by your health care provider. Make sure you discuss any questions you have with your health care provider. Document Released: 07/16/2017 Document Revised: 07/16/2017 Document Reviewed: 07/16/2017 Elsevier Interactive Patient Education  2019 [employer].

## 2018-11-04 NOTE — Progress Notes (Signed)
Subjective:    Patient ID: [name], female    DOB: 09/12/1932, 85 y.o.   MRN: [id number]  HPI This is an 85 yo female who presents today for annual exam. Had AWV 10/27/2018.   Last CPE- annual AWS, regular care Mammo- 10/330/14- declines further screening Colonoscopy- 10/31/14 Tdap- 04/30/04 Flu- annual Eye-annual Dental- biannual, recent deterioration, requiring new crowns Exercise- does something every day at [facility]  PT- patient with increased left knee pain and unsteadiness on her feet, no recent falls. She is interested in having some PT to improve strength, balance and knee pain.   Past Medical History:  Diagnosis Date  . Atrial fibrillation ([place])   . COPD (chronic obstructive pulmonary disease) ([place])   . Hypothyroidism   . Microscopic colitis   . Osteoporosis   . Squamous cell carcinoma   . Syncope    Past Surgical History:  Procedure Laterality Date  . APPENDECTOMY    . CATARACT EXTRACTION Bilateral   . COLONOSCOPY    . LIPOMA EXCISION    . MULTIPLE TOOTH EXTRACTIONS    . REFRACTIVE SURGERY    . SQUAMOUS CELL CARCINOMA EXCISION  07/10/2017   left shin   Family History  Problem Relation Age of Onset  . Alzheimer's disease Mother   . Brain cancer Father   . Cancer Brother 40       bladder   Social History   Tobacco Use  . Smoking status: Former Smoker    Packs/day: 1.00    Years: 40.00    Pack years: 40.00    Types: Cigarettes  . Smokeless tobacco: Never Used  Substance Use Topics  . Alcohol use: Yes    Comment: social  . Drug use: No      Review of Systems  Constitutional: Negative.   HENT: Positive for rhinorrhea (chronic with exercise/exertion).   Eyes: Negative.   Respiratory: Positive for shortness of breath (history of copd, DOE with walking hills, climbing stairs).   Cardiovascular: Negative.   Gastrointestinal: Negative for abdominal pain.       Left lower quadrant bulging when standing, no pain.   Endocrine: Negative.     Genitourinary: Positive for difficulty urinating (slow stream). Negative for decreased urine volume, dysuria and hematuria.       Urinary urgency and overflow incontinence.   Musculoskeletal: Positive for arthralgias (left knee pain). Negative for back pain.  Skin: Positive for color change (chronic hyperpigmentation of LE).  Allergic/Immunologic: Negative.   Neurological: Positive for weakness.       Feels like balance is poor.   Hematological: Bruises/bleeds easily.  Psychiatric/Behavioral: Negative.        Objective:   Physical Exam Vitals signs reviewed.  Constitutional:      General: She is not in acute distress.    Appearance: Normal appearance. She is normal weight. She is not ill-appearing, toxic-appearing or diaphoretic.  HENT:     Head: Normocephalic and atraumatic.     Nose: Nose normal.     Mouth/Throat:     Mouth: Mucous membranes are moist.     Pharynx: Oropharynx is clear.  Eyes:     Conjunctiva/sclera: Conjunctivae normal.     Pupils: Pupils are equal, round, and reactive to light.  Cardiovascular:     Rate and Rhythm: Normal rate and regular rhythm.     Heart sounds: Normal heart sounds.  Pulmonary:     Effort: Pulmonary effort is normal.     Breath sounds: Normal breath  sounds.  Chest:     Breasts: Breasts are symmetrical.        Right: Normal.        Left: Normal.  Abdominal:     General: Abdomen is flat. Bowel sounds are normal. There is no distension.     Palpations: Abdomen is soft. There is no mass.     Tenderness: There is no abdominal tenderness. There is no guarding.     Hernia: A hernia (LLQ, visible and palpable with standing, reducible. Not visible or palpable when lying down. ) is present.  Musculoskeletal: Normal range of motion.     Right lower leg: No edema.     Left lower leg: No edema.  Skin:    General: Skin is warm and dry.     Comments: Bilateral LE chronic hyperpigmentation.   Neurological:     Mental Status: She is alert and  oriented to person, place, and time.  Psychiatric:        Mood and Affect: Mood normal.        Behavior: Behavior normal.        Thought Content: Thought content normal.        Judgment: Judgment normal.       .BP 96/62   Pulse 80   Temp (!) 97.5 F (36.4 C)   Resp 10   Ht 5\' 9"  (1.753 m)   Wt 134 lb 8 oz (61 kg)   BMI 19.86 kg/m  Wt Readings from Last 3 Encounters:  11/04/18 134 lb 8 oz (61 kg)  10/27/18 135 lb 8 oz (61.5 kg)  05/14/18 137 lb 4 oz (62.3 kg)   Depression screen [facility] 2/9 10/27/2018 10/21/2017 10/15/2017 10/07/2016 10/03/2015  Decreased Interest 0 0 0 0 0  Down, Depressed, Hopeless 0 0 0 0 0  PHQ - 2 Score 0 0 0 0 0  Altered sleeping 0 0 - - -  Tired, decreased energy 0 0 - - -  Change in appetite 0 0 - - -  Feeling bad or failure about yourself  0 0 - - -  Trouble concentrating 0 0 - - -  Moving slowly or fidgety/restless 0 0 - - -  Suicidal thoughts 0 0 - - -  PHQ-9 Score 0 0 - - -  Difficult doing work/chores Not difficult at all Not difficult at all - - -   Results for orders placed or performed in visit on 11/04/18  POCT Urinalysis Dipstick (Automated)  Result Value Ref Range   Color, UA yellow    Clarity, UA clear    Glucose, UA Negative Negative   Bilirubin, UA negative    Ketones, UA negative    Spec Grav, UA 1.020 1.010 - 1.025   Blood, UA negative    pH, UA 5.5 5.0 - 8.0   Protein, UA Negative Negative   Urobilinogen, UA 0.2 0.2 or 1.0 E.U./dL   Nitrite, UA negative    Leukocytes, UA Negative Negative       Assessment & Plan:  1. Annual physical exam - overall she is doing well, she is interested in having some PT at [facility] to help with balance, strength and left knee pain - [place] labs 2. Urge incontinence of urine - negative UA, discussed double voiding and regular, timed voiding  - POCT Urinalysis Dipstick (Automated)  3. Chronic pain of left knee - Can take acetaminophen - Ambulatory referral to Physical Therapy  4.  Poor balance - Ambulatory referral to  Physical Therapy  5. Weakness generalized - Ambulatory referral to Physical Therapy  6. Ventral hernia without obstruction or gangrene - currently asymptomatic, discussed RTC precautions- pain, unable to reduce, change in bowel habits  - follow up in 6 months Clarene Reamer, FNP-BC  [facility]  11/04/2018 9:20 PM

## 2018-11-09 ENCOUNTER — Ambulatory Visit (INDEPENDENT_AMBULATORY_CARE_PROVIDER_SITE_OTHER): Payer: PPO

## 2018-11-09 DIAGNOSIS — I482 Chronic atrial fibrillation, unspecified: Secondary | ICD-10-CM | POA: Diagnosis not present

## 2018-11-09 DIAGNOSIS — Z7901 Long term (current) use of anticoagulants: Secondary | ICD-10-CM

## 2018-11-09 LAB — POCT INR: INR: 2.1 (ref 2.0–3.0)

## 2018-11-09 NOTE — Progress Notes (Signed)
I have reviewed this visit and I agree on the patient's plan of dosage and recommendations. Deborah B Gessner, FNP   

## 2018-11-09 NOTE — Patient Instructions (Signed)
Email Report received from [facility] Nurse: Wilford Sports, RN: 2.1  Spoke with Wilford Sports, RN on 11/09/18 and gave the following instructions: Continue taking 1 (5mg ) pill daily EXCEPT for 1/2 pill (2.5mg ) on Wednesdays and recheck in 4 weeks.  Notified Wilford Sports, RN at [facility] who will communicate to patient.    Patient denies any changes to diet, health or medications.

## 2018-11-16 ENCOUNTER — Telehealth: Payer: Self-pay | Admitting: Family Medicine

## 2018-11-16 NOTE — Telephone Encounter (Signed)
Called and spoke to patient about asymptomatic ventral hernia. Reviewed RTC/ER precautions with patient- pain, swelling. She verbalized understanding.

## 2018-11-16 NOTE — Telephone Encounter (Signed)
When patient was seen for her physical, she was told she has a hernia.  Patient is asking if there's anything she should be doing for the hernia.

## 2018-11-16 NOTE — Telephone Encounter (Signed)
Pt called [name] When patient was seen for her physical, she was told she has a hernia.  Patient is asking if there's anything she should be doing for the hernia

## 2018-11-26 DIAGNOSIS — R262 Difficulty in walking, not elsewhere classified: Secondary | ICD-10-CM | POA: Diagnosis not present

## 2018-11-26 DIAGNOSIS — R2689 Other abnormalities of gait and mobility: Secondary | ICD-10-CM | POA: Diagnosis not present

## 2018-11-30 ENCOUNTER — Other Ambulatory Visit: Payer: Self-pay | Admitting: Family Medicine

## 2018-11-30 NOTE — Telephone Encounter (Signed)
Please advise 

## 2018-12-07 ENCOUNTER — Ambulatory Visit (INDEPENDENT_AMBULATORY_CARE_PROVIDER_SITE_OTHER): Payer: PPO

## 2018-12-07 DIAGNOSIS — Z7901 Long term (current) use of anticoagulants: Secondary | ICD-10-CM | POA: Diagnosis not present

## 2018-12-07 DIAGNOSIS — I482 Chronic atrial fibrillation, unspecified: Secondary | ICD-10-CM | POA: Diagnosis not present

## 2018-12-07 LAB — POCT INR: INR: 2.3 (ref 2.0–3.0)

## 2018-12-10 NOTE — Patient Instructions (Signed)
Email Report received from [facility] Nurse: Wilford Sports, RN: 2.3  Spoke with Wilford Sports, RN on 12/07/18 and gave the following instructions: Continue taking 1 (5mg ) pill daily EXCEPT for 1/2 pill (2.5mg ) on Wednesdays and recheck in 4 weeks.  Notified Wilford Sports, RN at [facility] who will communicate to patient.    Patient denies any changes to diet, health or medications.

## 2018-12-16 NOTE — Progress Notes (Signed)
I have reviewed this visit and I agree on the patient's plan of dosage and recommendations. Deborah B Gessner, FNP   

## 2018-12-17 DIAGNOSIS — R262 Difficulty in walking, not elsewhere classified: Secondary | ICD-10-CM | POA: Diagnosis not present

## 2018-12-17 DIAGNOSIS — R531 Weakness: Secondary | ICD-10-CM | POA: Diagnosis not present

## 2018-12-17 DIAGNOSIS — R2689 Other abnormalities of gait and mobility: Secondary | ICD-10-CM | POA: Diagnosis not present

## 2019-01-04 ENCOUNTER — Ambulatory Visit (INDEPENDENT_AMBULATORY_CARE_PROVIDER_SITE_OTHER): Payer: PPO

## 2019-01-04 DIAGNOSIS — I482 Chronic atrial fibrillation, unspecified: Secondary | ICD-10-CM | POA: Diagnosis not present

## 2019-01-04 DIAGNOSIS — Z7901 Long term (current) use of anticoagulants: Secondary | ICD-10-CM

## 2019-01-04 LAB — POCT INR: INR: 2.3 (ref 2.0–3.0)

## 2019-01-04 NOTE — Patient Instructions (Signed)
Email Report received from [facility] Nurse: Wilford Sports, RN: 2.3  Spoke with Wilford Sports, RN on 01/04/19 and gave the following instructions: Continue taking 1 (5mg ) pill daily EXCEPT for 1/2 pill (2.5mg ) on Wednesdays and recheck in 4 weeks.  Notified Wilford Sports, RN at [facility] who will communicate to patient.    Patient denies any changes to diet, health or medications

## 2019-01-20 DIAGNOSIS — D485 Neoplasm of uncertain behavior of skin: Secondary | ICD-10-CM | POA: Diagnosis not present

## 2019-01-20 DIAGNOSIS — L98499 Non-pressure chronic ulcer of skin of other sites with unspecified severity: Secondary | ICD-10-CM | POA: Diagnosis not present

## 2019-01-29 ENCOUNTER — Other Ambulatory Visit: Payer: Self-pay | Admitting: Family Medicine

## 2019-02-01 ENCOUNTER — Ambulatory Visit (INDEPENDENT_AMBULATORY_CARE_PROVIDER_SITE_OTHER): Payer: PPO

## 2019-02-01 DIAGNOSIS — I482 Chronic atrial fibrillation, unspecified: Secondary | ICD-10-CM | POA: Diagnosis not present

## 2019-02-01 DIAGNOSIS — Z7901 Long term (current) use of anticoagulants: Secondary | ICD-10-CM

## 2019-02-01 LAB — POCT INR: INR: 1.9 — AB (ref 2.0–3.0)

## 2019-02-03 NOTE — Patient Instructions (Signed)
Email Report received from [facility] Nurse: Wilford Sports, RN: 1.9  Spoke with Wilford Sports, RN on 02/01/19 and gave the following instructions: Take 7.5mg  today (02/01/19) for a boost and then continue taking 1 (5mg ) pill daily EXCEPT for 1/2 pill (2.5mg ) on Wednesdays and recheck in 2 weeks.  Notified Wilford Sports, RN at [facility] who will communicate to patient.    Patient denies any changes to diet, health or medications.

## 2019-02-05 NOTE — Progress Notes (Signed)
I have reviewed this visit and I agree on the patient's plan of dosage and recommendations. Muriel Wilber B Lennette Fader, FNP   

## 2019-02-15 ENCOUNTER — Ambulatory Visit (INDEPENDENT_AMBULATORY_CARE_PROVIDER_SITE_OTHER): Payer: PPO

## 2019-02-15 DIAGNOSIS — Z7901 Long term (current) use of anticoagulants: Secondary | ICD-10-CM | POA: Diagnosis not present

## 2019-02-15 DIAGNOSIS — I482 Chronic atrial fibrillation, unspecified: Secondary | ICD-10-CM | POA: Diagnosis not present

## 2019-02-15 LAB — POCT INR: INR: 1.2 — AB (ref 2.0–3.0)

## 2019-02-15 NOTE — Patient Instructions (Signed)
INR today: 1.2  *patient reports excess green consumption, denies any missed doses.   Email Report received from [facility] Nurse: Wilford Sports, RN: 1.2  Spoke with Wilford Sports, RN on 02/15/19 and gave the following instructions: STOP GREENS. Take 7.5mg  today (02/15/19) and 7.5mg  on Tuesday 6/2 for a boost and then continue taking 1 (5mg ) pill daily EXCEPT for 1/2 pill (2.5mg ) on Wednesdays and recheck in 1 week.  Notified Wilford Sports, RN at [facility] who will communicate to patient.

## 2019-02-22 ENCOUNTER — Ambulatory Visit (INDEPENDENT_AMBULATORY_CARE_PROVIDER_SITE_OTHER): Payer: PPO

## 2019-02-22 DIAGNOSIS — I482 Chronic atrial fibrillation, unspecified: Secondary | ICD-10-CM | POA: Diagnosis not present

## 2019-02-22 DIAGNOSIS — Z5181 Encounter for therapeutic drug level monitoring: Secondary | ICD-10-CM

## 2019-02-22 LAB — POCT INR: INR: 2.3 (ref 2.0–3.0)

## 2019-02-22 NOTE — Progress Notes (Signed)
I have reviewed this visit and I agree on the patient's plan of dosage and recommendations. Deborah B Gessner, FNP   

## 2019-02-23 NOTE — Patient Instructions (Signed)
INR today: 2.3  Email Report received from [facility] Nurse: Wilford Sports, RN: 2.3  Spoke with Wilford Sports, RN on 02/22/19 via email and gave the following instructions: Continue taking prior dosage of 1 (5mg ) pill daily EXCEPT for 1/2 pill (2.5mg ) on Wednesdays and recheck in 2 weeks.  Notified Wilford Sports, RN at [facility] who will communicate to patient.

## 2019-03-01 NOTE — Progress Notes (Signed)
I have reviewed this visit and I agree on the patient's plan of dosage and recommendations. Genisis Sonnier B Donnamarie Shankles, FNP   

## 2019-03-08 ENCOUNTER — Ambulatory Visit (INDEPENDENT_AMBULATORY_CARE_PROVIDER_SITE_OTHER): Payer: PPO

## 2019-03-08 DIAGNOSIS — I482 Chronic atrial fibrillation, unspecified: Secondary | ICD-10-CM | POA: Diagnosis not present

## 2019-03-08 DIAGNOSIS — Z7901 Long term (current) use of anticoagulants: Secondary | ICD-10-CM

## 2019-03-08 LAB — POCT INR: INR: 2.6 (ref 2.0–3.0)

## 2019-03-08 NOTE — Patient Instructions (Addendum)
INR today: 2.6  Email Report received from [facility] Nurse: Zenovia Jordan RN: 2.6  Spoke with Zenovia Jordan, RN on 02/22/19 via email and gave the following instructions: Continue taking prior dosage of 1 (5mg ) pill daily EXCEPT for 1/2 pill (2.5mg ) on Wednesdays and recheck in 4 weeks.  Notified Zenovia Jordan, RN at [facility] who will communicate to patient.

## 2019-03-09 NOTE — Progress Notes (Signed)
I have reviewed this visit and I agree on the patient's plan of dosage and recommendations. Deborah B Gessner, FNP   

## 2019-03-26 ENCOUNTER — Telehealth: Payer: Self-pay | Admitting: Family Medicine

## 2019-03-26 NOTE — Telephone Encounter (Signed)
It is optional to have someone come to the home for evaluation. She doesn't have to do it if she doesn't want to.

## 2019-03-26 NOTE — Telephone Encounter (Signed)
Patient advised.

## 2019-03-26 NOTE — Telephone Encounter (Signed)
Best number [phone]  Pt called her insurance [employer] called and wanted to send a NP to her home to give pt a cpx free.  Pt wanted to know if she needs to do this.  She is not happy with doing that.  Please advise

## 2019-03-29 ENCOUNTER — Other Ambulatory Visit: Payer: Self-pay

## 2019-03-29 ENCOUNTER — Encounter: Payer: Self-pay | Admitting: Podiatry

## 2019-03-29 ENCOUNTER — Ambulatory Visit: Payer: Medicare Other | Admitting: Podiatry

## 2019-03-29 VITALS — Temp 98.2°F

## 2019-03-29 DIAGNOSIS — D689 Coagulation defect, unspecified: Secondary | ICD-10-CM | POA: Diagnosis not present

## 2019-03-29 DIAGNOSIS — B351 Tinea unguium: Secondary | ICD-10-CM

## 2019-03-29 DIAGNOSIS — M2041 Other hammer toe(s) (acquired), right foot: Secondary | ICD-10-CM | POA: Diagnosis not present

## 2019-03-29 DIAGNOSIS — M2042 Other hammer toe(s) (acquired), left foot: Secondary | ICD-10-CM

## 2019-03-29 DIAGNOSIS — M79674 Pain in right toe(s): Secondary | ICD-10-CM

## 2019-03-29 DIAGNOSIS — L84 Corns and callosities: Secondary | ICD-10-CM | POA: Diagnosis not present

## 2019-03-29 DIAGNOSIS — M79675 Pain in left toe(s): Secondary | ICD-10-CM | POA: Diagnosis not present

## 2019-03-29 NOTE — Progress Notes (Signed)
Complaint:  Visit Type: Patient returns to my office for continued preventative foot care services. Complaint: Patient states" my nails have grown long and thick and become painful to walk and wear shoes" The patient presents for preventative foot care services. Patient has painful corn third toe right foot. No changes to ROS.  Patient is taking coumadin.  Podiatric Exam: Vascular: dorsalis pedis and posterior tibial pulses are not  palpable bilateral. Capillary return is immediate. Temperature gradient is WNL. Skin turgor WNL .  Brown discoloration legs/foot  B/L. Sensorium: Normal Semmes Weinstein monofilament test. Normal tactile sensation bilaterally. Nail Exam: Pt has thick disfigured discolored nails with subungual debris noted bilateral entire nail hallux through fifth toenails Ulcer Exam: There is no evidence of ulcer or pre-ulcerative changes or infection. Orthopedic Exam: Muscle tone and strength are WNL. No limitations in general ROM. No crepitus or effusions noted. Foot type and digits show no abnormalities.HAV  B/L.    Hammer toes  2-4  B/L. Skin: No Porokeratosis. No infection or ulcers.  Clavi 3rd toe right foot.  Diagnosis:  Onychomycosis, , Pain in right toe, pain in left toes  Treatment & Plan Procedures and Treatment: Consent by patient was obtained for treatment procedures.   Debridement of mycotic and hypertrophic toenails, 1 through 5 bilateral and clearing of subungual debris. No ulceration, no infection noted. Iatrogenic lesion cauterized fourth toe right foot.  Crest pad was dispensed  For both feet. Return Visit-Office Procedure: Patient instructed to return to the office for a follow up visit 3 months for continued evaluation and treatment.    Gardiner Barefoot DPM

## 2019-04-05 DIAGNOSIS — I482 Chronic atrial fibrillation, unspecified: Secondary | ICD-10-CM | POA: Diagnosis not present

## 2019-04-06 ENCOUNTER — Ambulatory Visit (INDEPENDENT_AMBULATORY_CARE_PROVIDER_SITE_OTHER): Payer: PPO

## 2019-04-06 DIAGNOSIS — Z7901 Long term (current) use of anticoagulants: Secondary | ICD-10-CM

## 2019-04-06 LAB — POCT INR: INR: 1.9 — AB (ref 2.0–3.0)

## 2019-04-06 NOTE — Patient Instructions (Signed)
INR today: 1.9  Email Report received from [facility] Nurse: Zenovia Jordan RN: 1.9  Results communicated to [place] and Wilford Sports, RN.  Patient is to take 7.5mg  today for a boost (7/21) and then Continue taking prior dosage of 1 (5mg ) pill daily EXCEPT for 1/2 pill (2.5mg ) on Wednesdays and recheck in 3 weeks.  Notified Wilford Sports, RN/ Zenovia Jordan, RN at [facility] who will communicate to patient.

## 2019-04-16 NOTE — Progress Notes (Signed)
I have reviewed this visit and I agree on the patient's plan of dosage and recommendations. Anothony Bursch B Regnia Mathwig, FNP   

## 2019-04-26 DIAGNOSIS — I482 Chronic atrial fibrillation, unspecified: Secondary | ICD-10-CM | POA: Diagnosis not present

## 2019-04-26 LAB — POCT INR: INR: 2.1 (ref 2.0–3.0)

## 2019-04-27 ENCOUNTER — Ambulatory Visit (INDEPENDENT_AMBULATORY_CARE_PROVIDER_SITE_OTHER): Payer: PPO

## 2019-04-27 DIAGNOSIS — Z7901 Long term (current) use of anticoagulants: Secondary | ICD-10-CM | POA: Diagnosis not present

## 2019-04-27 NOTE — Patient Instructions (Signed)
INR today: 2.1  Email Report received from [facility] Nurse: Zenovia Jordan RN: 2.1  Results communicated to [place].  Continue taking prior dosage of 1 (5mg ) pill daily EXCEPT for 1/2 pill (2.5mg ) on Wednesdays and recheck in 3-4 weeks. Zenovia Jordan, RN at [facility] who will communicate to patient.

## 2019-04-29 NOTE — Progress Notes (Signed)
I have reviewed this visit and I agree on the patient's plan of dosage and recommendations. Deborah B Gessner, FNP   

## 2019-05-04 ENCOUNTER — Telehealth: Payer: Self-pay | Admitting: Family Medicine

## 2019-05-04 NOTE — Telephone Encounter (Signed)
Handicap Placard Application placed in [facility] in box to complete.

## 2019-05-04 NOTE — Telephone Encounter (Signed)
Patient dropped off her Handicapped Placard Form to be filled out.  Patient is requesting form mailed back to her in envelope she provided.  Form is in rx tower.

## 2019-05-05 NOTE — Telephone Encounter (Signed)
Form completed and placed in outgoing mail.

## 2019-05-10 NOTE — Progress Notes (Signed)
Cardiology Office Note  Date:  05/11/2019   ID:  [name], DOB 1932-05-05, MRN [id number]  PCP:  Elby Beck, FNP   Chief Complaint  Patient presents with  . Other    12 month follow up. Patient c/o SOB from COPD. Meds reviewed verbally with patient.     HPI:  [name] is a 85 year old woman with  chronic atrial fibrillation, on warfarin COPD,   40 years of smoking, hyperlipidemia rate control medications  Chronic leg pain She lives at twin [place]. who presents for routine followup of her atrial fibrillation.   continued leg pain, on gabapentin, Managed by neurology  Active, "exercises" Cleaning attack Computer exercises Activity limited by shortness of breath which she attributes to her COPD She has not established with pulmonary  chronic vertigo, balance issues, legs weak Previously seen by [name], Dr. Pryor Ochoa for vertigo Leg pain  Chronically low blood pressure, denies any orthostasis symptoms BP elevated today  EKG personally reviewed by myself on todays visit Shows atrial fibrillation ventricular rate 79 bpm no significant ST or T wave changes  Other past medical history Prior cardiac notes from Dr. Sheppard Coil in [place] indicate she has chronic atrial fibrillation,  echocardiogram in 2010 and March 2014 confirming atrial fibrillation. No old EKGs are available.  Echocardiogram from March 2014 shows normal LV systolic function, severely dilated left and right atrium, mildly elevated right ventricular systolic pressure estimated at 35 mm of mercury  PMH:   has a past medical history of Atrial fibrillation ([place]), COPD (chronic obstructive pulmonary disease) ([place]), Hypothyroidism, Microscopic colitis, Osteoporosis, Squamous cell carcinoma, and Syncope.  PSH:    Past Surgical History:  Procedure Laterality Date  . APPENDECTOMY    . CATARACT EXTRACTION Bilateral   . COLONOSCOPY    . LIPOMA EXCISION    . MULTIPLE TOOTH EXTRACTIONS    . REFRACTIVE SURGERY     . SQUAMOUS CELL CARCINOMA EXCISION  07/10/2017   left shin    Current Outpatient Medications  Medication Sig Dispense Refill  . ADVAIR DISKUS 100-50 MCG/DOSE AEPB USE 1 INHALATION BY MOUTH TWICE DAILY 180 each 2  . Cholecalciferol (VITAMIN D-3) 1000 UNITS CAPS Take 1,000 Units by mouth every other day.     . digoxin (LANOXIN) 0.125 MG tablet TAKE 1 TABLET BY MOUTH EVERY DAY 90 tablet 2  . gabapentin (NEURONTIN) 100 MG capsule Take 100 mg by mouth. Pt takes 2 in the am and 2 at night    . levothyroxine (SYNTHROID) 50 MCG tablet TAKE 1 TABLET BY MOUTH DAILY, THEN 1/2 TABLET THE NEXT DAY ALTERNATING 68 tablet 3  . verapamil (CALAN-SR) 120 MG CR tablet TAKE 1 TABLET(120 MG) BY MOUTH DAILY 90 tablet 2  . warfarin (COUMADIN) 5 MG tablet TAKE AS DIRECTED BY ANTI-COAGULATION CLINIC 90 tablet 1   No current facility-administered medications for this visit.      Allergies:   Other and Sulfa antibiotics   Social History:  The patient  reports that she has quit smoking. Her smoking use included cigarettes. She has a 40.00 pack-year smoking history. She has never used smokeless tobacco. She reports current alcohol use. She reports that she does not use drugs.   Family History:   family history includes Alzheimer's disease in her mother; Brain cancer in her father; Cancer (age of onset: 22) in her brother.    Review of Systems: Review of Systems  HENT: Negative.   Respiratory: Negative.   Cardiovascular: Negative.   Gastrointestinal: Negative.  Musculoskeletal: Negative.        Poor balance, pain in her feet  Neurological: Negative.   Psychiatric/Behavioral: Negative.   All other systems reviewed and are negative.    PHYSICAL EXAM: VS:  BP (!) 140/94 (BP Location: Left Arm, Patient Position: Sitting, Cuff Size: Normal)   Pulse 79   Ht 5\' 8"  (1.727 m)   Wt 134 lb 4 oz (60.9 kg)   BMI 20.41 kg/m  , BMI Body mass index is 20.41 kg/m.  Constitutional:  oriented to person, place, and  time. No distress. Thin, muscle atrophy HENT:  Head: Grossly normal Eyes:  no discharge. No scleral icterus.  Neck: No JVD, no carotid bruits  Cardiovascular: Regular rate and rhythm, no murmurs appreciated Pulmonary/Chest: Clear to auscultation bilaterally, no wheezes or rails Abdominal: Soft.  no distension.  no tenderness.  Musculoskeletal: Normal range of motion Neurological:  normal muscle tone. Coordination normal. No atrophy Skin: Skin warm and dry Psychiatric: normal affect, pleasant   Recent Labs: 10/27/2018: ALT 21; BUN 12; Creatinine, Ser 0.76; Hemoglobin 15.5; Platelets 194.0; Potassium 4.6; Sodium 138; TSH 2.93    Lipid Panel Lab Results  Component Value Date   CHOL 203 (H) 10/27/2018   HDL 67.40 10/27/2018   LDLCALC 109 (H) 10/27/2018   TRIG 135.0 10/27/2018      Wt Readings from Last 3 Encounters:  05/11/19 134 lb 4 oz (60.9 kg)  11/04/18 134 lb 8 oz (61 kg)  10/27/18 135 lb 8 oz (61.5 kg)     ASSESSMENT AND PLAN:  Atrial fibrillation, unspecified type ([place]) - Plan: EKG 12-Lead Rate controlled , on anticoagulation She would like to stop "pills" She could consider holding the digoxin Tolerating warfarin Discussed using a pulse oximeter to measure her pulse if she does stop digoxin  HLD (hyperlipidemia) Currently not on cholesterol medication Losing weight  Centrilobular emphysema (HCC) Stable, No recent COPD exacerbation Suggested pulse meter to monitor oxygen  Leg pain  problems with her feet, previously seen by podiatry and neurology  Foot drop. [place]  On Neurontin    Total encounter time more than 25 minutes  Greater than 50% was spent in counseling and coordination of care with the patient   Disposition:   F/U  12 months   Orders Placed This Encounter  Procedures  . EKG 12-Lead     Signed, Esmond Plants, M.D., Ph.D. 05/11/2019  [facility] [place], [place] [phone]

## 2019-05-11 ENCOUNTER — Encounter: Payer: Self-pay | Admitting: Cardiovascular Disease

## 2019-05-11 ENCOUNTER — Ambulatory Visit (INDEPENDENT_AMBULATORY_CARE_PROVIDER_SITE_OTHER): Payer: PPO | Admitting: Cardiovascular Disease

## 2019-05-11 ENCOUNTER — Other Ambulatory Visit: Payer: Self-pay

## 2019-05-11 VITALS — BP 140/94 | HR 79 | Ht 68.0 in | Wt 134.2 lb

## 2019-05-11 DIAGNOSIS — I482 Chronic atrial fibrillation, unspecified: Secondary | ICD-10-CM

## 2019-05-11 DIAGNOSIS — J449 Chronic obstructive pulmonary disease, unspecified: Secondary | ICD-10-CM | POA: Diagnosis not present

## 2019-05-11 DIAGNOSIS — J432 Centrilobular emphysema: Secondary | ICD-10-CM | POA: Diagnosis not present

## 2019-05-11 DIAGNOSIS — I872 Venous insufficiency (chronic) (peripheral): Secondary | ICD-10-CM | POA: Diagnosis not present

## 2019-05-11 DIAGNOSIS — E782 Mixed hyperlipidemia: Secondary | ICD-10-CM | POA: Diagnosis not present

## 2019-05-11 NOTE — Patient Instructions (Addendum)
Medication Instructions:  No changes  You could consider holding the digoxin If you hold digoxin, monitor your heart rate You could purchase a pulse oximeter  If you need a refill on your cardiac medications before your next appointment, please call your pharmacy.    Lab work: No new labs needed   If you have labs (blood work) drawn today and your tests are completely normal, you will receive your results only by: Marland Kitchen MyChart Message (if you have MyChart) OR . A paper copy in the mail If you have any lab test that is abnormal or we need to change your treatment, we will call you to review the results.   Testing/Procedures: No new testing needed   Follow-Up: At [facility], you and your health needs are our priority.  As part of our continuing mission to provide you with exceptional heart care, we have created designated Provider Care Teams.  These Care Teams include your primary Cardiologist (physician) and Advanced Practice Providers (APPs -  Physician Assistants and Nurse Practitioners) who all work together to provide you with the care you need, when you need it.  . You will need a follow up appointment in 12 months .   Please call our office 2 months in advance to schedule this appointment.    . Providers on your designated Care Team:   . Murray Hodgkins, NP . Christell Faith, PA-C . Marrianne Mood, PA-C  Any Other Special Instructions Will Be Listed Below (If Applicable).  For educational health videos Log in to : www.[email or web address] Or : [email or web address], password : [email or web address]  [place]. You have been referred to Pulmonary for COPD management.

## 2019-05-11 NOTE — Addendum Note (Signed)
Addended by: Valora Corporal on: 05/11/2019 10:50 AM   Modules accepted: Orders

## 2019-05-14 DIAGNOSIS — L259 Unspecified contact dermatitis, unspecified cause: Secondary | ICD-10-CM | POA: Diagnosis not present

## 2019-05-20 DIAGNOSIS — I482 Chronic atrial fibrillation, unspecified: Secondary | ICD-10-CM | POA: Diagnosis not present

## 2019-05-20 LAB — POCT INR: INR: 2.7 (ref 2.0–3.0)

## 2019-05-25 ENCOUNTER — Ambulatory Visit (INDEPENDENT_AMBULATORY_CARE_PROVIDER_SITE_OTHER): Payer: PPO

## 2019-05-25 DIAGNOSIS — Z7901 Long term (current) use of anticoagulants: Secondary | ICD-10-CM

## 2019-05-25 DIAGNOSIS — I482 Chronic atrial fibrillation, unspecified: Secondary | ICD-10-CM

## 2019-05-25 DIAGNOSIS — I4891 Unspecified atrial fibrillation: Secondary | ICD-10-CM | POA: Diagnosis not present

## 2019-05-25 NOTE — Patient Instructions (Signed)
INR today: 2.7  *Note:  I did not receive this result until today (05/25/19)  Email Report received from [facility] Nurse: Zenovia Jordan RN: 2.3 Also, responded to Manuela Schwartz and Denyce Robert, RN with the following orders:  Results communicated to [place].  Continue taking prior dosage of 1 (5mg ) pill daily EXCEPT for 1/2 pill (2.5mg ) on Wednesdays and recheck in 4 weeks. Zenovia Jordan, RN/Andra Moshe Cipro, RN at [facility] who will communicate to patient.

## 2019-05-26 NOTE — Progress Notes (Signed)
I have reviewed this visit and I agree on the patient's plan of dosage and recommendations. Deborah B Gessner, FNP   

## 2019-06-02 DIAGNOSIS — Z7901 Long term (current) use of anticoagulants: Secondary | ICD-10-CM | POA: Insufficient documentation

## 2019-06-03 NOTE — Progress Notes (Signed)
INR reviewed. Agree with instructions provided by [facility] RN. Will follow. Webb Silversmith, NP

## 2019-06-14 ENCOUNTER — Other Ambulatory Visit: Payer: Self-pay | Admitting: *Deleted

## 2019-06-14 ENCOUNTER — Other Ambulatory Visit: Payer: Self-pay | Admitting: Family Medicine

## 2019-06-14 MED ORDER — DIGOXIN 125 MCG PO TABS: 125.0000 ug | ORAL_TABLET | Freq: Every day | ORAL | 0 refills | Status: DC

## 2019-06-14 MED ORDER — VERAPAMIL HCL ER 120 MG PO TBCR: EXTENDED_RELEASE_TABLET | ORAL | 0 refills | Status: DC

## 2019-06-16 NOTE — Telephone Encounter (Signed)
Patient is compliant with coumadin management, refill X 21mths.

## 2019-06-22 ENCOUNTER — Telehealth: Payer: Self-pay | Admitting: Family Medicine

## 2019-06-22 DIAGNOSIS — G629 Polyneuropathy, unspecified: Secondary | ICD-10-CM | POA: Diagnosis not present

## 2019-06-22 DIAGNOSIS — M21372 Foot drop, left foot: Secondary | ICD-10-CM | POA: Diagnosis not present

## 2019-06-22 DIAGNOSIS — M21371 Foot drop, right foot: Secondary | ICD-10-CM | POA: Diagnosis not present

## 2019-06-22 NOTE — Telephone Encounter (Signed)
Best number  [phone]  Pt called she wanted to get debbie advise/opinion of who she should see for pulmonology.  She wants to go to [place]. She wants to do a little research on dr prior to scheduling appointment

## 2019-06-24 ENCOUNTER — Ambulatory Visit (INDEPENDENT_AMBULATORY_CARE_PROVIDER_SITE_OTHER): Payer: PPO

## 2019-06-24 DIAGNOSIS — Z7901 Long term (current) use of anticoagulants: Secondary | ICD-10-CM | POA: Diagnosis not present

## 2019-06-24 DIAGNOSIS — I482 Chronic atrial fibrillation, unspecified: Secondary | ICD-10-CM | POA: Diagnosis not present

## 2019-06-24 LAB — POCT INR: INR: 2.2 (ref 2.0–3.0)

## 2019-06-24 NOTE — Telephone Encounter (Signed)
Please call the patient and tell her that all of the pulmonologist that we use in [place] are excellent and I feel comfortable sending her to any of them.

## 2019-06-25 NOTE — Telephone Encounter (Signed)
ATC, unable to reach Will call back Monday

## 2019-07-01 ENCOUNTER — Encounter: Payer: Self-pay | Admitting: Podiatry

## 2019-07-01 ENCOUNTER — Ambulatory Visit: Payer: PPO | Admitting: Podiatry

## 2019-07-01 ENCOUNTER — Other Ambulatory Visit: Payer: Self-pay

## 2019-07-01 ENCOUNTER — Encounter: Payer: Self-pay | Admitting: Family Medicine

## 2019-07-01 DIAGNOSIS — M79674 Pain in right toe(s): Secondary | ICD-10-CM

## 2019-07-01 DIAGNOSIS — B351 Tinea unguium: Secondary | ICD-10-CM

## 2019-07-01 DIAGNOSIS — M79675 Pain in left toe(s): Secondary | ICD-10-CM

## 2019-07-01 NOTE — Progress Notes (Signed)
Complaint:  Visit Type: Patient returns to my office for continued preventative foot care services. Complaint: Patient states" my nails have grown long and thick and become painful to walk and wear shoes" The patient presents for preventative foot care services.  Patient is taking coumadin.  Podiatric Exam: Vascular: dorsalis pedis and posterior tibial pulses are not  palpable bilateral. Capillary return is immediate. Temperature gradient is WNL. Skin turgor WNL .  Brown discoloration legs/foot  B/L. Sensorium: Normal Semmes Weinstein monofilament test. Normal tactile sensation bilaterally. Nail Exam: Pt has thick disfigured discolored nails with subungual debris noted bilateral entire nail hallux through fifth toenails Ulcer Exam: There is no evidence of ulcer or pre-ulcerative changes or infection. Orthopedic Exam: Muscle tone and strength are WNL. No limitations in general ROM. No crepitus or effusions noted. Foot type and digits show no abnormalities.HAV  B/L.    Hammer toes  2-4  B/L. Skin: No Porokeratosis. No infection or ulcers.    Diagnosis:  Onychomycosis, , Pain in right toe, pain in left toes  Treatment & Plan Procedures and Treatment: Consent by patient was obtained for treatment procedures.   Debridement of mycotic and hypertrophic toenails, 1 through 5 bilateral and clearing of subungual debris. No ulceration, no infection noted.   Crest pad was dispensed  For both feet. Return Visit-Office Procedure: Patient instructed to return to the office for a follow up visit 3 months for continued evaluation and treatment.    Gardiner Barefoot DPM

## 2019-07-01 NOTE — Telephone Encounter (Signed)
Unable to reach patient, phone line disconnected at this time.  Will try again

## 2019-07-05 NOTE — Progress Notes (Signed)
I have reviewed this visit and I agree on the patient's plan of dosage and recommendations. Deborah B Gessner, FNP   

## 2019-07-05 NOTE — Patient Instructions (Signed)
INR today: 2.2  *Note:    Email Report received from [facility] Nurse: Zenovia Jordan RN: 2.2 Also, responded to [place]  with the following orders:  Results communicated to [place].  Continue taking prior dosage of 1 (5mg ) pill daily EXCEPT for 1/2 pill (2.5mg ) on Wednesdays and recheck in 4 weeks. Zenovia Jordan, RN/Andra Moshe Cipro, RN at [facility] who will communicate to patient.

## 2019-07-13 DIAGNOSIS — Z961 Presence of intraocular lens: Secondary | ICD-10-CM | POA: Diagnosis not present

## 2019-07-19 NOTE — Telephone Encounter (Signed)
See mychart messages. This has been addressed

## 2019-07-22 ENCOUNTER — Ambulatory Visit (INDEPENDENT_AMBULATORY_CARE_PROVIDER_SITE_OTHER): Payer: PPO

## 2019-07-22 DIAGNOSIS — I482 Chronic atrial fibrillation, unspecified: Secondary | ICD-10-CM

## 2019-07-22 DIAGNOSIS — Z7901 Long term (current) use of anticoagulants: Secondary | ICD-10-CM | POA: Diagnosis not present

## 2019-07-22 LAB — POCT INR: INR: 2.7 (ref 2.0–3.0)

## 2019-07-27 NOTE — Patient Instructions (Addendum)
INR today: 2.7    Email Report received from [facility] Nurse on 11/6: Crystal Also, responded to [employer] via [place] on 11/6 with the following orders:  Continue taking prior dosage of 1 (5mg ) pill daily EXCEPT for 1/2 pill (2.5mg ) on Wednesdays and recheck in 4 weeks. Crystal, RN at [facility] who will communicate to patient.

## 2019-07-30 ENCOUNTER — Encounter: Payer: Self-pay | Admitting: Family Medicine

## 2019-07-30 NOTE — Progress Notes (Signed)
I have reviewed this visit and I agree on the patient's plan of dosage and recommendations. Deborah B Gessner, FNP   

## 2019-08-19 ENCOUNTER — Encounter: Payer: Self-pay | Admitting: Internal Medicine

## 2019-08-19 ENCOUNTER — Other Ambulatory Visit: Payer: Self-pay

## 2019-08-19 ENCOUNTER — Ambulatory Visit (INDEPENDENT_AMBULATORY_CARE_PROVIDER_SITE_OTHER): Payer: PPO | Admitting: Internal Medicine

## 2019-08-19 VITALS — BP 138/76 | HR 86 | Temp 97.0°F | Ht 67.0 in | Wt 136.0 lb

## 2019-08-19 DIAGNOSIS — Z7901 Long term (current) use of anticoagulants: Secondary | ICD-10-CM | POA: Diagnosis not present

## 2019-08-19 DIAGNOSIS — J449 Chronic obstructive pulmonary disease, unspecified: Secondary | ICD-10-CM

## 2019-08-19 NOTE — Progress Notes (Signed)
Name: [name] MRN: [id number] DOB: 06/05/1932     CONSULTATION DATE: 08/19/2019  REFERRING MD : Carlean Purl  CHIEF COMPLAINT: Assessment for COPD   HISTORY OF PRESENT ILLNESS: 85 yo with chronic atrial fibrillation, on warfarin H/o COPD,   40 years of smoking. +hyperlipidemia. Chronic leg pain /She lives at twin [place] Quit smoking 2009 Had noticed increased work of breathing and shortness of breath with exertion Patient currently on Advair discus 100 puff daily She would like to go to Advair Diskus 100 puff twice a day Patient is very hesitant to try new therapies and new medications at this time  No  COPD exacerbation at this time No evidence of heart failure at this time No evidence or signs of infection at this time No respiratory distress No fevers, chills, nausea, vomiting, diarrhea No evidence of lower extremity edema No evidence hemoptysis      PAST MEDICAL HISTORY :   has a past medical history of Atrial fibrillation ([place]), COPD (chronic obstructive pulmonary disease) ([place]), Hypothyroidism, Microscopic colitis, Osteoporosis, Squamous cell carcinoma, and Syncope.  has a past surgical history that includes Appendectomy; Lipoma excision; Cataract extraction (Bilateral); Colonoscopy; Multiple tooth extractions; Refractive surgery; and Squamous cell carcinoma excision (07/10/2017). Prior to Admission medications   Medication Sig Start Date End Date Taking? Authorizing Provider  ADVAIR DISKUS 100-50 MCG/DOSE AEPB USE 1 INHALATION BY MOUTH TWICE DAILY 03/20/18   Elby Beck, FNP  Cholecalciferol (VITAMIN D-3) 1000 UNITS CAPS Take 1,000 Units by mouth every other day.     [provider]  digoxin (LANOXIN) 0.125 MG tablet Take 1 tablet (125 mcg total) by mouth daily. 06/14/19   Minna Merritts, MD  gabapentin (NEURONTIN) 100 MG capsule Take 100 mg by mouth. Pt takes 2 in the am and 2 at night 11/27/16   [provider]  levothyroxine (SYNTHROID) 50  MCG tablet TAKE 1 TABLET BY MOUTH DAILY, THEN 1/2 TABLET THE NEXT DAY ALTERNATING 01/29/19   Elby Beck, FNP  verapamil (CALAN-SR) 120 MG CR tablet TAKE 1 TABLET(120 MG) BY MOUTH DAILY 06/14/19   Minna Merritts, MD  warfarin (COUMADIN) 5 MG tablet TAKE AS DIRECTED BY CLINIC 06/16/19   Elby Beck, FNP   Allergies  Allergen Reactions  . Other Nausea Only  . Sulfa Antibiotics Nausea And Vomiting    FAMILY HISTORY:  family history includes Alzheimer's disease in her mother; Brain cancer in her father; Cancer (age of onset: 74) in her brother. SOCIAL HISTORY:  reports that she has quit smoking. Her smoking use included cigarettes. She has a 40.00 pack-year smoking history. She has never used smokeless tobacco. She reports current alcohol use. She reports that she does not use drugs.    Review of Systems:  Gen:  Denies  fever, sweats, chills weigh loss  HEENT: Denies blurred vision, double vision, ear pain, eye pain, hearing loss, nose bleeds, sore throat Cardiac:  No dizziness, chest pain or heaviness, chest tightness,edema, No JVD Resp:   No cough, -sputum production, -shortness of breath,-wheezing, -hemoptysis,  Gi: Denies swallowing difficulty, stomach pain, nausea or vomiting, diarrhea, constipation, bowel incontinence Gu:  Denies bladder incontinence, burning urine Ext:   Denies Joint pain, stiffness or swelling Skin: Denies  skin rash, easy bruising or bleeding or hives Endoc:  Denies polyuria, polydipsia , polyphagia or weight change Psych:   Denies depression, insomnia or hallucinations  Other:  All other systems negative    BP 138/76 (BP Location: Left Arm,  Cuff Size: Normal)   Pulse 86   Temp (!) 97 F (36.1 C) (Temporal)   Ht 5\' 7"  (1.702 m)   Wt 136 lb (61.7 kg)   SpO2 94%   BMI 21.30 kg/m   Physical Examination:   GENERAL:NAD, no fevers, chills, no weakness no fatigue HEAD: Normocephalic, atraumatic.  EYES: PERLA, EOMI No scleral icterus.  NECK:  Supple.  PULMONARY: CTA B/L no wheezing, rhonchi, crackles CARDIOVASCULAR: S1 and S2. Regular rate and rhythm. No murmurs GASTROINTESTINAL: Soft, nontender, nondistended. Positive bowel sounds.  MUSCULOSKELETAL: No swelling, clubbing, or edema.  NEUROLOGIC: No gross focal neurological deficits. 5/5 strength all extremities SKIN: No ulceration, lesions, rashes, or cyanosis.  PSYCHIATRIC: Insight, judgment intact. -depression -anxiety ALL OTHER ROS ARE NEGATIVE   MEDICATIONS: I have reviewed all medications and confirmed regimen as documented       ASSESSMENT AND PLAN SYNOPSIS  85 year old pleasant white female seen today for assessment for her COPD which was diagnosed many years ago, at this time I do not have any pulmonary function testing to assess her respiratory lung function however she may have mild to moderate obstructive airways disease based on her symptoms At this time Advair 1 puff daily does not seem to help her and at the same time she does not want to add any new medications or any new therapies at this time and is very hesitant to try anything new so I have advised to increase her Advair Diskus 100 twice daily  I have asked the patient to come back in 3 months to assess her respiratory status and to establish a plan of care whether or not she wants to try any new inhalers  At this time there is no evidence of infection or exacerbation at this time    COVID-19 EDUCATION: The signs and symptoms of COVID-19 were discussed with the patient and how to seek care for testing.  The importance of social distancing was discussed today. Hand Washing Techniques and avoid touching face was advised.     MEDICATION ADJUSTMENTS/LABS AND TESTS ORDERED: Advair discus 101 puff twice daily At next visit we will discuss the need for pulmonary function testing as well has additional inhalers Will also assess the need for chest radiograph   CURRENT MEDICATIONS REVIEWED AT [place]   Patient satisfied with Plan of action and management. All questions answered  Follow up in 3 months  Tabitha Riggins Patricia Pesa, M.D.  [facility]  Medical Director [place] Director [facility] Cardio-Pulmonary Department

## 2019-08-19 NOTE — Patient Instructions (Signed)
START ADVAIR DISKUS 100 One Puff in AM and One puff on PM

## 2019-08-20 ENCOUNTER — Ambulatory Visit (INDEPENDENT_AMBULATORY_CARE_PROVIDER_SITE_OTHER): Payer: PPO | Admitting: General Practice

## 2019-08-20 DIAGNOSIS — Z7901 Long term (current) use of anticoagulants: Secondary | ICD-10-CM | POA: Diagnosis not present

## 2019-08-20 LAB — PROTIME-INR: INR: 2.5 — AB (ref <=1.1)

## 2019-08-20 NOTE — Patient Instructions (Signed)
Pre visit review using our clinic review tool, if applicable. No additional management support is needed unless otherwise documented below in the visit note.  Email Report received from [facility] Nurse: Crystal Also, responded to [employer]  with the following orders: Continue taking prior dosage of 1 (5mg ) pill daily EXCEPT for 1/2 pill (2.5mg ) on Wednesdays and recheck in 4 weeks. Crystal, RN at [facility] who will communicate to patient.

## 2019-08-20 NOTE — Progress Notes (Signed)
Medical screening examination/treatment/procedure(s) were performed by non-physician practitioner and as supervising physician I was immediately available for consultation/collaboration. I agree with above. Petro Talent, MD   

## 2019-08-23 ENCOUNTER — Other Ambulatory Visit: Payer: Self-pay | Admitting: *Deleted

## 2019-08-23 ENCOUNTER — Other Ambulatory Visit: Payer: Self-pay

## 2019-08-23 MED ORDER — FLUTICASONE-SALMETEROL 100-50 MCG/DOSE IN AEPB: INHALATION_SPRAY | RESPIRATORY_TRACT | 2 refills | Status: DC

## 2019-08-23 MED ORDER — DIGOXIN 125 MCG PO TABS: 125.0000 ug | ORAL_TABLET | Freq: Every day | ORAL | 0 refills | Status: DC

## 2019-08-23 MED ORDER — VERAPAMIL HCL ER 120 MG PO TBCR: EXTENDED_RELEASE_TABLET | ORAL | 0 refills | Status: DC

## 2019-08-23 NOTE — Telephone Encounter (Signed)
*  STAT* If patient is at the pharmacy, call can be transferred to refill team.   1. Which medications need to be refilled? (please list name of each medication and dose if known) Digoxin, verapamil 2. Which pharmacy/location (including street and city if local pharmacy) is medication to be sent to? [place]  3. Do they need a 30 day or 90 day supply? [place]

## 2019-08-24 ENCOUNTER — Telehealth: Payer: Self-pay | Admitting: Internal Medicine

## 2019-08-24 ENCOUNTER — Other Ambulatory Visit: Payer: Self-pay

## 2019-08-24 NOTE — Telephone Encounter (Signed)
Received PA request for Wixela 100 from [facility].  I have spoken to [place] with [facility], who stated that Advair is covered with a 90 dollar co pay for 60 day supply. Spoke to [place] with [facility] and made him aware of this information. Nothing further is needed at this time.

## 2019-09-20 DIAGNOSIS — I482 Chronic atrial fibrillation, unspecified: Secondary | ICD-10-CM | POA: Diagnosis not present

## 2019-09-20 LAB — POCT INR: INR: 2.2 (ref 2.0–3.0)

## 2019-09-21 ENCOUNTER — Ambulatory Visit (INDEPENDENT_AMBULATORY_CARE_PROVIDER_SITE_OTHER): Payer: PPO

## 2019-09-21 DIAGNOSIS — Z7901 Long term (current) use of anticoagulants: Secondary | ICD-10-CM

## 2019-09-21 NOTE — Progress Notes (Signed)
-  Patient notified by telephone that her INR results are in range and to continue as she has been doing. Patient was advised that she needs to have her INR rechecked four weeks from yesterday which would be 10/18/19 and she verbalized understanding

## 2019-09-21 NOTE — Patient Instructions (Signed)
Email Report received from [place]: Waldemar Dickens.  Patient notified by Emelia Salisbury, LPN to  Continue taking prior dosage of 1 (5mg ) pill daily EXCEPT for 1/2 pill (2.5mg ) on Wednesdays and recheck in 4 weeks at [facility].  [place] facility to email Lab report at next check.   No changes in diet, health or medications.

## 2019-09-23 ENCOUNTER — Ambulatory Visit: Payer: PPO

## 2019-09-30 ENCOUNTER — Ambulatory Visit: Payer: PPO | Admitting: Podiatry

## 2019-09-30 DIAGNOSIS — Z08 Encounter for follow-up examination after completed treatment for malignant neoplasm: Secondary | ICD-10-CM | POA: Diagnosis not present

## 2019-09-30 DIAGNOSIS — Z85828 Personal history of other malignant neoplasm of skin: Secondary | ICD-10-CM | POA: Diagnosis not present

## 2019-09-30 DIAGNOSIS — D225 Melanocytic nevi of trunk: Secondary | ICD-10-CM | POA: Diagnosis not present

## 2019-09-30 DIAGNOSIS — D2261 Melanocytic nevi of right upper limb, including shoulder: Secondary | ICD-10-CM | POA: Diagnosis not present

## 2019-09-30 DIAGNOSIS — D2271 Melanocytic nevi of right lower limb, including hip: Secondary | ICD-10-CM | POA: Diagnosis not present

## 2019-09-30 DIAGNOSIS — D2262 Melanocytic nevi of left upper limb, including shoulder: Secondary | ICD-10-CM | POA: Diagnosis not present

## 2019-10-04 ENCOUNTER — Other Ambulatory Visit: Payer: Self-pay

## 2019-10-04 ENCOUNTER — Ambulatory Visit: Payer: PPO | Admitting: Podiatry

## 2019-10-04 DIAGNOSIS — B351 Tinea unguium: Secondary | ICD-10-CM

## 2019-10-04 DIAGNOSIS — M2042 Other hammer toe(s) (acquired), left foot: Secondary | ICD-10-CM | POA: Diagnosis not present

## 2019-10-04 DIAGNOSIS — M79675 Pain in left toe(s): Secondary | ICD-10-CM

## 2019-10-04 DIAGNOSIS — D689 Coagulation defect, unspecified: Secondary | ICD-10-CM | POA: Diagnosis not present

## 2019-10-04 DIAGNOSIS — M2041 Other hammer toe(s) (acquired), right foot: Secondary | ICD-10-CM | POA: Diagnosis not present

## 2019-10-04 DIAGNOSIS — M79674 Pain in right toe(s): Secondary | ICD-10-CM | POA: Diagnosis not present

## 2019-10-04 NOTE — Progress Notes (Signed)
Complaint:  Visit Type: Patient returns to my office for continued preventative foot care services. Complaint: Patient states" my nails have grown long and thick and become painful to walk and wear shoes" The patient presents for preventative foot care services.  Patient is taking coumadin.  Podiatric Exam: Vascular: dorsalis pedis and posterior tibial pulses are not  palpable bilateral. Capillary return is immediate. Temperature gradient is WNL. Skin turgor WNL .  Brown discoloration legs/foot  B/L. Sensorium: Normal Semmes Weinstein monofilament test. Normal tactile sensation bilaterally. Nail Exam: Pt has thick disfigured discolored nails with subungual debris noted bilateral entire nail hallux through fifth toenails Ulcer Exam: There is no evidence of ulcer or pre-ulcerative changes or infection. Orthopedic Exam: Muscle tone and strength are WNL. No limitations in general ROM. No crepitus or effusions noted. Foot type and digits show no abnormalities.HAV  B/L.    Hammer toes  2-4  B/L. Skin: No Porokeratosis. No infection or ulcers.    Diagnosis:  Onychomycosis, , Pain in right toe, pain in left toes  Treatment & Plan Procedures and Treatment: Consent by patient was obtained for treatment procedures.   Debridement of mycotic and hypertrophic toenails, 1 through 5 bilateral and clearing of subungual debris. No ulceration, no infection noted.   Crest pad was dispensed  For both feet. Return Visit-Office Procedure: Patient instructed to return to the office for a follow up visit 3 months for continued evaluation and treatment.    Gardiner Barefoot DPM

## 2019-10-05 NOTE — Progress Notes (Signed)
I have reviewed this visit and I agree on the patient's plan of dosage and recommendations. Fae Blossom B Rodolphe Edmonston, FNP   

## 2019-10-18 ENCOUNTER — Ambulatory Visit (INDEPENDENT_AMBULATORY_CARE_PROVIDER_SITE_OTHER): Payer: PPO

## 2019-10-18 DIAGNOSIS — Z7901 Long term (current) use of anticoagulants: Secondary | ICD-10-CM

## 2019-10-18 DIAGNOSIS — I482 Chronic atrial fibrillation, unspecified: Secondary | ICD-10-CM | POA: Diagnosis not present

## 2019-10-18 LAB — POCT INR: INR: 2.9 (ref 2.0–3.0)

## 2019-10-18 NOTE — Patient Instructions (Addendum)
Pre visit review using our clinic review tool, if applicable. No additional management support is needed unless otherwise documented below in the visit note.  Received results from [place], RN and Waldemar Dickens at [facility]. INR today is 2.9. No changes in dose, continue 5mg  daily and recheck in 4 wks. Advised pt of above instructions. Pt verbalized understanding.

## 2019-10-29 ENCOUNTER — Ambulatory Visit: Payer: PPO

## 2019-11-01 ENCOUNTER — Other Ambulatory Visit: Payer: Self-pay | Admitting: Family Medicine

## 2019-11-01 ENCOUNTER — Ambulatory Visit: Payer: PPO

## 2019-11-01 ENCOUNTER — Other Ambulatory Visit: Payer: Self-pay

## 2019-11-01 DIAGNOSIS — Z5181 Encounter for therapeutic drug level monitoring: Secondary | ICD-10-CM

## 2019-11-01 DIAGNOSIS — E039 Hypothyroidism, unspecified: Secondary | ICD-10-CM

## 2019-11-01 DIAGNOSIS — Z7901 Long term (current) use of anticoagulants: Secondary | ICD-10-CM

## 2019-11-01 NOTE — Progress Notes (Signed)
Labs entered for annual exam

## 2019-11-01 NOTE — Progress Notes (Signed)
I have reviewed this visit and I agree on the patient's plan of dosage and recommendations. Adrielle Polakowski B Imelda Dandridge, FNP   

## 2019-11-02 ENCOUNTER — Other Ambulatory Visit: Payer: Self-pay

## 2019-11-02 ENCOUNTER — Ambulatory Visit (INDEPENDENT_AMBULATORY_CARE_PROVIDER_SITE_OTHER): Payer: PPO

## 2019-11-02 DIAGNOSIS — Z0000 Encounter for general adult medical examination without abnormal findings: Secondary | ICD-10-CM | POA: Diagnosis not present

## 2019-11-02 NOTE — Progress Notes (Signed)
PCP notes:  Health Maintenance: No gaps noted     Abnormal Screenings: none   Patient concerns: Left elbow pain onset several months ago Issues with urinary incontinence  Nurse concerns: none   Next PCP appt: 11/08/2019 @ 2:30 pm

## 2019-11-02 NOTE — Patient Instructions (Signed)
[name] , Thank you for taking time to come for your Medicare Wellness Visit. I appreciate your ongoing commitment to your health goals. Please review the following plan we discussed and let me know if I can assist you in the future.   Screening recommendations/referrals: Colonoscopy: no longer required Mammogram: no longer required Bone Density: completed 06/02/2012 Recommended yearly ophthalmology/optometry visit for glaucoma screening and checkup Recommended yearly dental visit for hygiene and checkup  Vaccinations: Influenza vaccine: Up to date, completed 06/24/2019 Pneumococcal vaccine: Completed series Tdap vaccine: decline Shingles vaccine: discussed    Advanced directives: Please bring a copy of your POA (Power of Attorney) and/or Living Will to your next appointment.   Conditions/risks identified: hyperlipidemia  Next appointment: 11/08/2019 @ 2:30 pm    Preventive Care 65 Years and Older, Female Preventive care refers to lifestyle choices and visits with your health care provider that can promote health and wellness. What does preventive care include?  A yearly physical exam. This is also called an annual well check.  Dental exams once or twice a year.  Routine eye exams. Ask your health care provider how often you should have your eyes checked.  Personal lifestyle choices, including:  Daily care of your teeth and gums.  Regular physical activity.  Eating a healthy diet.  Avoiding tobacco and drug use.  Limiting alcohol use.  Practicing safe sex.  Taking low-dose aspirin every day.  Taking vitamin and mineral supplements as recommended by your health care provider. What happens during an annual well check? The services and screenings done by your health care provider during your annual well check will depend on your age, overall health, lifestyle risk factors, and family history of disease. Counseling  Your health care provider may ask you questions about  your:  Alcohol use.  Tobacco use.  Drug use.  Emotional well-being.  Home and relationship well-being.  Sexual activity.  Eating habits.  History of falls.  Memory and ability to understand (cognition).  Work and work Statistician.  Reproductive health. Screening  You may have the following tests or measurements:  Height, weight, and BMI.  Blood pressure.  Lipid and cholesterol levels. These may be checked every 5 years, or more frequently if you are over 45 years old.  Skin check.  Lung cancer screening. You may have this screening every year starting at age 77 if you have a 30-pack-year history of smoking and currently smoke or have quit within the past 15 years.  Fecal occult blood test (FOBT) of the stool. You may have this test every year starting at age 91.  Flexible sigmoidoscopy or colonoscopy. You may have a sigmoidoscopy every 5 years or a colonoscopy every 10 years starting at age 66.  Hepatitis C blood test.  Hepatitis B blood test.  Sexually transmitted disease (STD) testing.  Diabetes screening. This is done by checking your blood sugar (glucose) after you have not eaten for a while (fasting). You may have this done every 1-3 years.  Bone density scan. This is done to screen for osteoporosis. You may have this done starting at age 76.  Mammogram. This may be done every 1-2 years. Talk to your health care provider about how often you should have regular mammograms. Talk with your health care provider about your test results, treatment options, and if necessary, the need for more tests. Vaccines  Your health care provider may recommend certain vaccines, such as:  Influenza vaccine. This is recommended every year.  Tetanus, diphtheria, and  acellular pertussis (Tdap, Td) vaccine. You may need a Td booster every 10 years.  Zoster vaccine. You may need this after age 74.  Pneumococcal 13-valent conjugate (PCV13) vaccine. One dose is recommended  after age 26.  Pneumococcal polysaccharide (PPSV23) vaccine. One dose is recommended after age 84. Talk to your health care provider about which screenings and vaccines you need and how often you need them. This information is not intended to replace advice given to you by your health care provider. Make sure you discuss any questions you have with your health care provider. Document Released: 09/29/2015 Document Revised: 05/22/2016 Document Reviewed: 07/04/2015 Elsevier Interactive Patient Education  2017 [place] Prevention in the Home Falls can cause injuries. They can happen to people of all ages. There are many things you can do to make your home safe and to help prevent falls. What can I do on the outside of my home?  Regularly fix the edges of walkways and driveways and fix any cracks.  Remove anything that might make you trip as you walk through a door, such as a raised step or threshold.  Trim any bushes or trees on the path to your home.  Use bright outdoor lighting.  Clear any walking paths of anything that might make someone trip, such as rocks or tools.  Regularly check to see if handrails are loose or broken. Make sure that both sides of any steps have handrails.  Any raised decks and porches should have guardrails on the edges.  Have any leaves, snow, or ice cleared regularly.  Use sand or salt on walking paths during winter.  Clean up any spills in your garage right away. This includes oil or grease spills. What can I do in the bathroom?  Use night lights.  Install grab bars by the toilet and in the tub and shower. Do not use towel bars as grab bars.  Use non-skid mats or decals in the tub or shower.  If you need to sit down in the shower, use a plastic, non-slip stool.  Keep the floor dry. Clean up any water that spills on the floor as soon as it happens.  Remove soap buildup in the tub or shower regularly.  Attach bath mats securely with  double-sided non-slip rug tape.  Do not have throw rugs and other things on the floor that can make you trip. What can I do in the bedroom?  Use night lights.  Make sure that you have a light by your bed that is easy to reach.  Do not use any sheets or blankets that are too big for your bed. They should not hang down onto the floor.  Have a firm chair that has side arms. You can use this for support while you get dressed.  Do not have throw rugs and other things on the floor that can make you trip. What can I do in the kitchen?  Clean up any spills right away.  Avoid walking on wet floors.  Keep items that you use a lot in easy-to-reach places.  If you need to reach something above you, use a strong step stool that has a grab bar.  Keep electrical cords out of the way.  Do not use floor polish or wax that makes floors slippery. If you must use wax, use non-skid floor wax.  Do not have throw rugs and other things on the floor that can make you trip. What can I do with my stairs?  Do not leave any items on the stairs.  Make sure that there are handrails on both sides of the stairs and use them. Fix handrails that are broken or loose. Make sure that handrails are as long as the stairways.  Check any carpeting to make sure that it is firmly attached to the stairs. Fix any carpet that is loose or worn.  Avoid having throw rugs at the top or bottom of the stairs. If you do have throw rugs, attach them to the floor with carpet tape.  Make sure that you have a light switch at the top of the stairs and the bottom of the stairs. If you do not have them, ask someone to add them for you. What else can I do to help prevent falls?  Wear shoes that:  Do not have high heels.  Have rubber bottoms.  Are comfortable and fit you well.  Are closed at the toe. Do not wear sandals.  If you use a stepladder:  Make sure that it is fully opened. Do not climb a closed stepladder.  Make  sure that both sides of the stepladder are locked into place.  Ask someone to hold it for you, if possible.  Clearly mark and make sure that you can see:  Any grab bars or handrails.  First and last steps.  Where the edge of each step is.  Use tools that help you move around (mobility aids) if they are needed. These include:  Canes.  Walkers.  Scooters.  Crutches.  Turn on the lights when you go into a dark area. Replace any light bulbs as soon as they burn out.  Set up your furniture so you have a clear path. Avoid moving your furniture around.  If any of your floors are uneven, fix them.  If there are any pets around you, be aware of where they are.  Review your medicines with your doctor. Some medicines can make you feel dizzy. This can increase your chance of falling. Ask your doctor what other things that you can do to help prevent falls. This information is not intended to replace advice given to you by your health care provider. Make sure you discuss any questions you have with your health care provider. Document Released: 06/29/2009 Document Revised: 02/08/2016 Document Reviewed: 10/07/2014 Elsevier Interactive Patient Education  2017 [employer].

## 2019-11-02 NOTE — Progress Notes (Signed)
Subjective:   [name] is a 86 y.o. female who presents for Medicare Annual (Subsequent) preventive examination.  Review of Systems: N/A   This visit is being conducted through telemedicine via telephone at the nurse health advisor's home address due to the COVID-19 pandemic. This patient has given me verbal consent via doximity to conduct this visit, patient states they are participating from their home address. Patient and myself are on the telephone call. There is no referral for this visit. Some vital signs may be absent or patient reported.    Patient identification: identified by name, DOB, and current address   Cardiac Risk Factors include: advanced age (>41men, >31 women);dyslipidemia     Objective:     Vitals: There were no vitals taken for this visit.  There is no height or weight on file to calculate BMI.  Advanced Directives 11/02/2019 10/27/2018 10/21/2017 01/31/2017 12/16/2016 10/07/2016  Does Patient Have a Medical Advance Directive? Yes Yes Yes Yes Yes Yes  Type of Paramedic of [place];Living will [place];Living will [place];Living will [place];Living will [place];Living will [place];Living will  Copy of [place] in Chart? No - copy requested No - copy requested No - copy requested - - No - copy requested    Tobacco Social History   Tobacco Use  Smoking Status Former Smoker  . Packs/day: 1.00  . Years: 40.00  . Pack years: 40.00  . Types: Cigarettes  Smokeless Tobacco Never Used     Counseling given: Not Answered   Clinical Intake:  Pre-visit preparation completed: Yes  Pain : 0-10 Pain Score: 1  Pain Type: Chronic pain Pain Location: Leg Pain Orientation: Right, Left Pain Descriptors / Indicators: Aching Pain Onset: More than a month ago Pain Frequency: Intermittent     Nutritional Risks:  None Diabetes: No  How often do you need to have someone help you when you read instructions, pamphlets, or other written materials from your doctor or pharmacy?: 1 - Never What is the last grade level you completed in school?: 2 years of college  Interpreter Needed?: No  Information entered by :: CJohnson, LPN  Past Medical History:  Diagnosis Date  . Atrial fibrillation ([place])   . COPD (chronic obstructive pulmonary disease) ([place])   . Hypothyroidism   . Microscopic colitis   . Osteoporosis   . Squamous cell carcinoma   . Syncope    Past Surgical History:  Procedure Laterality Date  . APPENDECTOMY    . CATARACT EXTRACTION Bilateral   . COLONOSCOPY    . LIPOMA EXCISION    . MULTIPLE TOOTH EXTRACTIONS    . REFRACTIVE SURGERY    . SQUAMOUS CELL CARCINOMA EXCISION  07/10/2017   left shin   Family History  Problem Relation Age of Onset  . Alzheimer's disease Mother   . Brain cancer Father   . Cancer Brother 106       bladder   Social History   Socioeconomic History  . Marital status: Widowed    Spouse name: Not on file  . Number of children: Not on file  . Years of education: Not on file  . Highest education level: Not on file  Occupational History  . Not on file  Tobacco Use  . Smoking status: Former Smoker    Packs/day: 1.00    Years: 40.00    Pack years: 40.00    Types:  Cigarettes  . Smokeless tobacco: Never Used  Substance and Sexual Activity  . Alcohol use: Not Currently    Comment: social  . Drug use: No  . Sexual activity: Never  Other Topics Concern  . Not on file  Social History Narrative   Recently moved to [facility] 03/2013 from [place].   Husband died in 11-20-2011.      Son (adopted) lives in [place], [place].  Two grandchildren- 47, 68 yo.   DNR- forms signed and returned to patient.            Social Determinants of Health   Financial Resource Strain: Low Risk   . Difficulty of Paying Living Expenses: Not hard at all  Food Insecurity: No  Food Insecurity  . Worried About Charity fundraiser in the Last Year: Never true  . Ran Out of Food in the Last Year: Never true  Transportation Needs: No Transportation Needs  . Lack of Transportation (Medical): No  . Lack of Transportation (Non-Medical): No  Physical Activity: Sufficiently Active  . Days of Exercise per Week: 7 days  . Minutes of Exercise per Session: 60 min  Stress: No Stress Concern Present  . Feeling of Stress : Not at all  Social Connections:   . Frequency of Communication with Friends and Family: Not on file  . Frequency of Social Gatherings with Friends and Family: Not on file  . Attends Religious Services: Not on file  . Active Member of Clubs or Organizations: Not on file  . Attends Archivist Meetings: Not on file  . Marital Status: Not on file    Outpatient Encounter Medications as of 11/02/2019  Medication Sig  . Cholecalciferol (VITAMIN D-3) 1000 UNITS CAPS Take 1,000 Units by mouth every other day.   . digoxin (LANOXIN) 0.125 MG tablet Take 1 tablet (125 mcg total) by mouth daily.  . Fluticasone-Salmeterol (ADVAIR DISKUS) 100-50 MCG/DOSE AEPB USE 1 INHALATION BY MOUTH TWICE DAILY  . gabapentin (NEURONTIN) 100 MG capsule Take 100 mg by mouth. Pt takes 2 in the am and 2 at night  . levothyroxine (SYNTHROID) 50 MCG tablet TAKE 1 TABLET BY MOUTH DAILY, THEN 1/2 TABLET THE NEXT DAY ALTERNATING  . verapamil (CALAN-SR) 120 MG CR tablet TAKE 1 TABLET(120 MG) BY MOUTH DAILY  . warfarin (COUMADIN) 5 MG tablet TAKE AS DIRECTED BY CLINIC   No facility-administered encounter medications on file as of 11/02/2019.    Activities of Daily Living In your present state of health, do you have any difficulty performing the following activities: 11/02/2019  Hearing? Y  Comment some hearing loss noted  Vision? Y  Comment has some vision issues  Difficulty concentrating or making decisions? N  Walking or climbing stairs? Y  Comment has some balance issues   Dressing or bathing? N  Doing errands, shopping? N  Preparing Food and eating ? N  Using the Toilet? N  In the past six months, have you accidently leaked urine? Y  Comment wears pads when going out  Do you have problems with loss of bowel control? N  Managing your Medications? N  Managing your Finances? N  Housekeeping or managing your Housekeeping? N  Some recent data might be hidden    Patient Care Team: Elby Beck, FNP as PCP - General (Nurse Practitioner) Oneta Rack, MD (Dermatology) Carloyn Manner, MD as Referring Physician (Otolaryngology) Estill Cotta, MD as Consulting Physician (Ophthalmology) Rockey Situ Kathlene November, MD as Consulting Physician (Cardiology) Arther Dames  Araceli Bouche, MD as Referring Physician (Gastroenterology) Oneta Rack, MD (Dermatology)    Assessment:   This is a routine wellness examination for [name].  Exercise Activities and Dietary recommendations Current Exercise Habits: Home exercise routine, Type of exercise: stretching;walking, Time (Minutes): 60, Frequency (Times/Week): 7, Weekly Exercise (Minutes/Week): 420, Intensity: Moderate, Exercise limited by: None identified  Goals    . Increase physical activity     Starting 10/27/2018, I will continue to exercise for at least 30 minutes 6 days per week.     . Patient Stated     11/02/2019, I will continue to exercise 7 days a week for 1 hour daily.        Fall Risk Fall Risk  11/02/2019 10/27/2018 10/21/2017 10/15/2017 10/07/2016  Falls in the past year? 0 0 No No No  Number falls in past yr: 0 - - - -  Injury with Fall? 0 - - - -  Risk for fall due to : Impaired balance/gait - - - -  Follow up Falls evaluation completed;Falls prevention discussed - - - -   Is the patient's home free of loose throw rugs in walkways, pet beds, electrical cords, etc?   yes      Grab bars in the bathroom? yes      Handrails on the stairs?   yes      Adequate lighting?   yes  Timed Get Up  and Go performed: N/A  Depression Screen PHQ 2/9 Scores 11/02/2019 10/27/2018 10/21/2017 10/15/2017  PHQ - 2 Score 2 0 0 0  PHQ- 9 Score 2 0 0 -     Cognitive Function MMSE - Mini Mental State Exam 11/02/2019 10/27/2018 10/21/2017 10/07/2016  Orientation to time 5 5 5 5   Orientation to Place 5 5 5 5   Registration 3 3 3 3   Attention/ Calculation 5 0 0 0  Recall 3 3 3 3   Language- name 2 objects - 0 0 0  Language- repeat 1 1 1 1   Language- follow 3 step command - 3 3 3   Language- read & follow direction - 0 0 0  Write a sentence - 0 0 0  Copy design - 0 0 0  Total score - 20 20 20   Mini Cog  Mini-Cog screen was completed. Maximum score is 22. A value of 0 denotes this part of the MMSE was not completed or the patient failed this part of the Mini-Cog screening.       Immunization History  Administered Date(s) Administered  . DTaP 04/30/2004  . Influenza, High Dose Seasonal PF 06/17/2014  . Influenza,inj,Quad PF,6+ Mos 06/22/2013  . Influenza-Unspecified 06/26/2015, 06/25/2017, 06/24/2019  . Moderna SARS-COVID-2 Vaccination 10/07/2019, 10/28/2019  . Pneumococcal Conjugate-13 09/29/2014  . Pneumococcal Polysaccharide-23 06/01/2012  . Varicella 08/06/2007    Qualifies for Shingles Vaccine: Yes  Screening Tests Health Maintenance  Topic Date Due  . TETANUS/TDAP  04/29/2024 (Originally 04/30/2014)  . INFLUENZA VACCINE  Completed  . DEXA SCAN  Completed  . PNA vac Low Risk Adult  Completed    Cancer Screenings: Lung: Low Dose CT Chest recommended if Age 61-80 years, 30 pack-year currently smoking OR have quit w/in 15 years. Patient does not qualify. Breast:  Mammogram: no longer required   Bone Density/Dexa: completed 06/02/2012 Colorectal: no longer required  Additional Screenings:  Hepatitis C Screening: N/A     Plan:   Patient will continue to exercise 7 days a week for 1 hour daily.   I have personally  reviewed and noted the following in the patient's chart:    . Medical and social history . Use of alcohol, tobacco or illicit drugs  . Current medications and supplements . Functional ability and status . Nutritional status . Physical activity . Advanced directives . List of other physicians . Hospitalizations, surgeries, and ER visits in previous 12 months . Vitals . Screenings to include cognitive, depression, and falls . Referrals and appointments  In addition, I have reviewed and discussed with patient certain preventive protocols, quality metrics, and best practice recommendations. A written personalized care plan for preventive services as well as general preventive health recommendations were provided to patient.     Andrez Grime, LPN  [phone]

## 2019-11-03 ENCOUNTER — Other Ambulatory Visit (INDEPENDENT_AMBULATORY_CARE_PROVIDER_SITE_OTHER): Payer: PPO

## 2019-11-03 ENCOUNTER — Other Ambulatory Visit: Payer: Self-pay

## 2019-11-03 DIAGNOSIS — E785 Hyperlipidemia, unspecified: Secondary | ICD-10-CM | POA: Diagnosis not present

## 2019-11-03 DIAGNOSIS — E039 Hypothyroidism, unspecified: Secondary | ICD-10-CM | POA: Diagnosis not present

## 2019-11-03 DIAGNOSIS — Z7901 Long term (current) use of anticoagulants: Secondary | ICD-10-CM | POA: Diagnosis not present

## 2019-11-03 DIAGNOSIS — Z5181 Encounter for therapeutic drug level monitoring: Secondary | ICD-10-CM

## 2019-11-03 LAB — TSH: TSH: 5.48 u[IU]/mL — ABNORMAL HIGH (ref 0.35–4.50)

## 2019-11-03 LAB — CBC WITH DIFFERENTIAL/PLATELET
Basophils Absolute: 0.1 10*3/uL (ref 0.0–0.1)
Basophils Relative: 1.2 % (ref 0.0–3.0)
Eosinophils Absolute: 0.3 10*3/uL (ref 0.0–0.7)
Eosinophils Relative: 5.8 % — ABNORMAL HIGH (ref 0.0–5.0)
HCT: 47.1 % — ABNORMAL HIGH (ref 36.0–46.0)
Hemoglobin: 15.8 g/dL — ABNORMAL HIGH (ref 12.0–15.0)
Lymphocytes Relative: 35 % (ref 12.0–46.0)
Lymphs Abs: 1.9 10*3/uL (ref 0.7–4.0)
MCHC: 33.6 g/dL (ref 30.0–36.0)
MCV: 93.3 fl (ref 78.0–100.0)
Monocytes Absolute: 0.5 10*3/uL (ref 0.1–1.0)
Monocytes Relative: 9.3 % (ref 3.0–12.0)
Neutro Abs: 2.7 10*3/uL (ref 1.4–7.7)
Neutrophils Relative %: 48.7 % (ref 43.0–77.0)
Platelets: 186 10*3/uL (ref 150.0–400.0)
RBC: 5.05 Mil/uL (ref 3.87–5.11)
RDW: 14.6 % (ref 11.5–15.5)
WBC: 5.5 10*3/uL (ref 4.0–10.5)

## 2019-11-03 LAB — BASIC METABOLIC PANEL
BUN: 14 mg/dL (ref 6–23)
CO2: 31 mEq/L (ref 19–32)
Calcium: 10 mg/dL (ref 8.4–10.5)
Chloride: 100 mEq/L (ref 96–112)
Creatinine, Ser: 0.72 mg/dL (ref 0.40–1.20)
GFR: 76.83 mL/min (ref >60.00)
Glucose, Bld: 80 mg/dL (ref 70–99)
Potassium: 4.1 mEq/L (ref 3.5–5.1)
Sodium: 136 mEq/L (ref 135–145)

## 2019-11-03 LAB — LIPID PANEL
Cholesterol: 220 mg/dL — ABNORMAL HIGH (ref 0–200)
HDL: 89.6 mg/dL (ref >39.00)
LDL Cholesterol: 108 mg/dL — ABNORMAL HIGH (ref 0–99)
NonHDL: 130.65
Total CHOL/HDL Ratio: 2
Triglycerides: 112 mg/dL (ref 0.0–149.0)
VLDL: 22.4 mg/dL (ref 0.0–40.0)

## 2019-11-03 NOTE — Addendum Note (Signed)
Addended by: Ellamae Sia on: 11/03/2019 08:31 AM   Modules accepted: Orders

## 2019-11-04 LAB — DIGOXIN LEVEL: Digoxin Level: <0.5 mcg/L — ABNORMAL LOW (ref 0.8–2.0)

## 2019-11-08 ENCOUNTER — Other Ambulatory Visit: Payer: Self-pay

## 2019-11-08 ENCOUNTER — Encounter: Payer: Self-pay | Admitting: Family Medicine

## 2019-11-08 ENCOUNTER — Ambulatory Visit (INDEPENDENT_AMBULATORY_CARE_PROVIDER_SITE_OTHER): Payer: PPO | Admitting: Family Medicine

## 2019-11-08 VITALS — BP 130/78 | HR 85 | Temp 97.7°F | Ht 67.0 in | Wt 136.1 lb

## 2019-11-08 DIAGNOSIS — E039 Hypothyroidism, unspecified: Secondary | ICD-10-CM

## 2019-11-08 DIAGNOSIS — I4811 Longstanding persistent atrial fibrillation: Secondary | ICD-10-CM | POA: Diagnosis not present

## 2019-11-08 DIAGNOSIS — J3489 Other specified disorders of nose and nasal sinuses: Secondary | ICD-10-CM | POA: Diagnosis not present

## 2019-11-08 DIAGNOSIS — M25522 Pain in left elbow: Secondary | ICD-10-CM

## 2019-11-08 DIAGNOSIS — Z7901 Long term (current) use of anticoagulants: Secondary | ICD-10-CM

## 2019-11-08 DIAGNOSIS — R454 Irritability and anger: Secondary | ICD-10-CM | POA: Diagnosis not present

## 2019-11-08 DIAGNOSIS — N393 Stress incontinence (female) (male): Secondary | ICD-10-CM | POA: Diagnosis not present

## 2019-11-08 LAB — POC URINALSYSI DIPSTICK (AUTOMATED)
Bilirubin, UA: NEGATIVE
Blood, UA: NEGATIVE
Glucose, UA: NEGATIVE
Ketones, UA: NEGATIVE
Leukocytes, UA: NEGATIVE
Nitrite, UA: NEGATIVE
Protein, UA: POSITIVE — AB
Spec Grav, UA: >=1.03 — AB (ref 1.010–1.025)
Urobilinogen, UA: 0.2 E.U./dL
pH, UA: 6 (ref 5.0–8.0)

## 2019-11-08 MED ORDER — GABAPENTIN 100 MG PO CAPS: 200.0000 mg | ORAL_CAPSULE | Freq: Three times a day (TID) | ORAL | 1 refills | Status: DC

## 2019-11-08 NOTE — Patient Instructions (Addendum)
Decrease am gabapentin to 1 tablet for 1 week, then stop  Increase bedtime gabapentin to 3 tablets- can do this right away  Ok to take Tylenol (acetaminophen)  Can stop digoxin  Over the counter- elbow sleeve, can also use heat  Let me know if you want a referral for pelvic floor physical therapy at [facility]  Follow up in 6 months

## 2019-11-08 NOTE — Progress Notes (Signed)
Subjective:    Patient ID: [name], female    DOB: 27-Jan-1932, 86 y.o.   MRN: [id number]  HPI Chief Complaint  Patient presents with  . Annual Exam    Several concerns   This is an 86 yo female who presents today for follow up of chronic medical conditions.   Foot pain- currently taking gabapentin 200 mg in the am, 200 mg at bedtime, and then 200 mg when she wakes up to go to the bathroom around 1 or 2 am. This was started by neurology, Dr. Melrose Nakayama. She does not have pain during the day, only at night. She thinks that the gabapentin has made her more moody and irritable.   Digoxin- Per cardiology, can wean to off so she has been taking every other day. Has a pulse oximeter to check her heart rate. She brings readings with her today that are all over the map- HR 40- 90, O2 60-80s. Suspect inaccurate readings due to afib, poor circulation and cool hands. She denies palpitations. Last dig level low.    Balance- walking 20 minutes a day. Does exercises from the computer. Seems to help.   Incontinence- has been doing Kegel exercises. Has started to have leakage, worse in the morning. Had 1 episode last week. Some stress incontinence. No dysuria, no hematuria.   Decreased near vision. Last eye exam in the fall. Eyes fatigued with reading/ computer work. Uses drops at end of day.   Some decreased smell, not taste. Mother with alzheimer's. Unknown age of onset. Died at age 75.   COPD- sees pulmonary, SOB with walking and carrying (pushing) groceries. Went up on her inhaler to 1 puff twice a day, no improvement. Has follow up on file.   Left elbow pain- with opening sliding door to patio. No swelling. Has not taken any medication for pain.   Nose drips. Chronic problem. Occurs with exercise/ exertion.    Review of Systems Per HPI    Objective:   Physical Exam Vitals reviewed.  Constitutional:      Appearance: Normal appearance.  HENT:     Head: Normocephalic and atraumatic.    Eyes:     Conjunctiva/sclera: Conjunctivae normal.  Cardiovascular:     Rate and Rhythm: Normal rate. Rhythm irregular.     Heart sounds: Normal heart sounds.  Pulmonary:     Effort: Pulmonary effort is normal.     Breath sounds: Normal breath sounds.  Musculoskeletal:     Comments: Left elbow with full ROM, no swelling. Mild tenderness to palpation of medial joint space.   Skin:    General: Skin is warm and dry.  Neurological:     Mental Status: She is alert and oriented to person, place, and time.  Psychiatric:        Mood and Affect: Mood normal.        Behavior: Behavior normal.        Thought Content: Thought content normal.        Judgment: Judgment normal.       BP 130/78 (BP Location: Left Arm, Patient Position: Sitting, Cuff Size: Normal)   Pulse 85   Temp 97.7 F (36.5 C) (Temporal)   Ht 5\' 7"  (1.702 m)   Wt 136 lb 1.9 oz (61.7 kg)   SpO2 91%   BMI 21.32 kg/m  Wt Readings from Last 3 Encounters:  11/08/19 136 lb 1.9 oz (61.7 kg)  08/19/19 136 lb (61.7 kg)  05/11/19 134 lb 4 oz (  60.9 kg)   Results for orders placed or performed in visit on 11/08/19  POCT Urinalysis Dipstick (Automated)  Result Value Ref Range   Color, UA light yellow    Clarity, UA clear    Glucose, UA Negative Negative   Bilirubin, UA neg    Ketones, UA neg    Spec Grav, UA >=1.030 (A) 1.010 - 1.025   Blood, UA neg    pH, UA 6.0 5.0 - 8.0   Protein, UA Positive (A) Negative   Urobilinogen, UA 0.2 0.2 or 1.0 E.U./dL   Nitrite, UA neg    Leukocytes, UA Negative Negative       Assessment & Plan:  1. Stress incontinence - needs to increase water, suggested regular, timed, daytime voiding. We discussed pelvic floor PT and she will think about it and let me know.  - POCT Urinalysis Dipstick (Automated)  2. Left elbow pain - likely mild bursitis, discussed wrapping and using acetaminophen  3. Longstanding persistent atrial fibrillation ([place]) - per cardiology note, can DC digoxin.  Discussed this with patient. Continue warfarin.   4. Hypothyroidism, unspecified type - elevated TSH, will increase levothyroxine from 50 to 75 mcg - levothyroxine (SYNTHROID) 75 MCG tablet; Take one tablet by mouth at least 30 minutes before eating. Take with a full glass of water.  Dispense: 90 tablet; Refill: 3  5. Long term (current) use of anticoagulants - continue warfarin with INR checks performed at [facility] and managed by [facility].   6. Irritability - she feels this is related to gabapentin and likely ongoing pandemic not helping.  - will wean daytime gabapentin over next week and increase night time gabapentin to 300 mg to see if she can avoid middle of the night dose.   7. Rhinorrhea - chronic problem, will try ipratropium nasal spray prn - ipratropium (ATROVENT) 0.03 % nasal spray; Place 2 sprays in each nostril three times a day as needed prior to activities that cause nose to run  Dispense: 30 mL; Refill: 5  - follow up in 6 months  This visit occurred during the SARS-CoV-2 public health emergency.  Safety protocols were in place, including screening questions prior to the visit, additional usage of staff PPE, and extensive cleaning of exam room while observing appropriate contact time as indicated for disinfecting solutions.     Clarene Reamer, FNP-BC  [facility]  11/09/2019 8:58 AM

## 2019-11-09 ENCOUNTER — Telehealth: Payer: Self-pay | Admitting: Family Medicine

## 2019-11-09 MED ORDER — IPRATROPIUM BROMIDE 0.03 % NA SOLN: NASAL | 5 refills | Status: DC

## 2019-11-09 MED ORDER — LEVOTHYROXINE SODIUM 75 MCG PO TABS: ORAL_TABLET | ORAL | 3 refills | Status: DC

## 2019-11-09 NOTE — Telephone Encounter (Signed)
Please call patient (there is a result note from today as well) and tell her that I have done some research about her nose running and have sent in a nasal spray that works differently than the fluticasone. She uses it as needed before activities that cause her nose to run. Up to 3 times a day. Continue on fluticasone for now, but can stop in a week or two to see if she needs both. It is a generic but I don't know how well it will be covered by her insurance.

## 2019-11-10 NOTE — Telephone Encounter (Signed)
Pt has already picked up the nasal spray - Atrovent  Pt states that she is not taking any other Nasal spray - [name] is aware.   Pt to start using PRN.   Nothing further needed.

## 2019-11-15 ENCOUNTER — Ambulatory Visit (INDEPENDENT_AMBULATORY_CARE_PROVIDER_SITE_OTHER): Payer: PPO

## 2019-11-15 DIAGNOSIS — Z7901 Long term (current) use of anticoagulants: Secondary | ICD-10-CM | POA: Diagnosis not present

## 2019-11-15 DIAGNOSIS — D802 Selective deficiency of immunoglobulin A [IgA]: Secondary | ICD-10-CM | POA: Diagnosis not present

## 2019-11-15 DIAGNOSIS — I482 Chronic atrial fibrillation, unspecified: Secondary | ICD-10-CM | POA: Diagnosis not present

## 2019-11-15 LAB — POCT INR: INR: 2.5 (ref 2.0–3.0)

## 2019-11-15 NOTE — Patient Instructions (Addendum)
Pre visit review using our clinic review tool, if applicable. No additional management support is needed unless otherwise documented below in the visit note.  Received results from [place], RN at [facility]. INR today is 2.5. No changes in dose, continue 5mg  daily and recheck in 4 wks. Advised pt of above instructions. Pt verbalized understanding.

## 2019-11-30 ENCOUNTER — Ambulatory Visit: Payer: PPO | Admitting: Internal Medicine

## 2019-11-30 ENCOUNTER — Other Ambulatory Visit: Payer: Self-pay

## 2019-11-30 ENCOUNTER — Encounter: Payer: Self-pay | Admitting: Internal Medicine

## 2019-11-30 ENCOUNTER — Telehealth: Payer: Self-pay | Admitting: Family Medicine

## 2019-11-30 VITALS — BP 112/64 | HR 96 | Ht 68.0 in | Wt 138.8 lb

## 2019-11-30 DIAGNOSIS — J449 Chronic obstructive pulmonary disease, unspecified: Secondary | ICD-10-CM | POA: Diagnosis not present

## 2019-11-30 NOTE — Progress Notes (Signed)
Name: [name] MRN: [id number] DOB: 11-Dec-1931     CONSULTATION DATE: 11/30/2019  REFERRING MD : Carlean Purl  CHIEF COMPLAINT:  Assessment for COPD  Synopsis 86 year old white female chronic A. fib on warfarin History of COPD 40 years smoking Quit smoking 2009 Advair discus 100 twice a day    HISTORY OF PRESENT ILLNESS:  chronic SOB and DOE has been increasing over the last several months  I have stated that patient needs pulmonary function testing as well as testing for exertional and nocturnal hypoxia  At this time patient is very hesitant to obtaining any type of testing at this time and she is adamant that she may not want oxygen therapy if needed  I have explained to her the usefulness of these tests to help establish her COPD as well as assess for hypoxia and to give her therapy according to her symptoms as well as test results  No COPD exacerbation at this time No evidence of heart failure at this time No evidence or signs of infection at this time No respiratory distress No fevers, chills, nausea, vomiting, diarrhea No evidence of lower extremity edema No evidence hemoptysis     PAST MEDICAL HISTORY :   has a past medical history of Atrial fibrillation ([place]), COPD (chronic obstructive pulmonary disease) ([place]), Hypothyroidism, Microscopic colitis, Osteoporosis, Squamous cell carcinoma, and Syncope.  has a past surgical history that includes Appendectomy; Lipoma excision; Cataract extraction (Bilateral); Colonoscopy; Multiple tooth extractions; Refractive surgery; and Squamous cell carcinoma excision (07/10/2017). Prior to Admission medications   Medication Sig Start Date End Date Taking? Authorizing Provider  ADVAIR DISKUS 100-50 MCG/DOSE AEPB USE 1 INHALATION BY MOUTH TWICE DAILY 03/20/18   Elby Beck, FNP  Cholecalciferol (VITAMIN D-3) 1000 UNITS CAPS Take 1,000 Units by mouth every other day.     [provider]  digoxin (LANOXIN) 0.125 MG  tablet Take 1 tablet (125 mcg total) by mouth daily. 06/14/19   Minna Merritts, MD  gabapentin (NEURONTIN) 100 MG capsule Take 100 mg by mouth. Pt takes 2 in the am and 2 at night 11/27/16   [provider]  levothyroxine (SYNTHROID) 50 MCG tablet TAKE 1 TABLET BY MOUTH DAILY, THEN 1/2 TABLET THE NEXT DAY ALTERNATING 01/29/19   Elby Beck, FNP  verapamil (CALAN-SR) 120 MG CR tablet TAKE 1 TABLET(120 MG) BY MOUTH DAILY 06/14/19   Minna Merritts, MD  warfarin (COUMADIN) 5 MG tablet TAKE AS DIRECTED BY CLINIC 06/16/19   Elby Beck, FNP   Allergies  Allergen Reactions  . Sulfa Antibiotics Nausea And Vomiting    FAMILY HISTORY:  family history includes Alzheimer's disease in her mother; Brain cancer in her father; Cancer (age of onset: 64) in her brother. SOCIAL HISTORY:  reports that she has quit smoking. Her smoking use included cigarettes. She has a 40.00 pack-year smoking history. She has never used smokeless tobacco. She reports previous alcohol use. She reports that she does not use drugs.     Review of Systems:  Gen:  Denies  fever, sweats, chills weight loss  HEENT: Denies blurred vision, double vision, ear pain, eye pain, hearing loss, nose bleeds, sore throat Cardiac:  No dizziness, chest pain or heaviness, chest tightness,edema, No JVD Resp:   No cough, -sputum production, +shortness of breath,+wheezing, -hemoptysis,  Gi: Denies swallowing difficulty, stomach pain, nausea or vomiting, diarrhea, constipation, bowel incontinence Gu:  Denies bladder incontinence, burning urine Ext:   Denies Joint pain, stiffness or swelling Skin:  Denies  skin rash, easy bruising or bleeding or hives Endoc:  Denies polyuria, polydipsia , polyphagia or weight change Psych:   Denies depression, insomnia or hallucinations  Other:  All other systems negative   BP 112/64 (BP Location: Left Arm, Cuff Size: Normal)   Pulse 96   Ht 5\' 8"  (1.727 m)   Wt 138 lb 12.8 oz (63 kg)    SpO2 98%   BMI 21.10 kg/m    Physical Examination:   General Appearance: No distress  Neuro:without focal findings,  speech normal,  HEENT: PERRLA, EOM intact.   Pulmonary: normal breath sounds, No wheezing.  CardiovascularNormal S1,S2.  No m/r/g.   Abdomen: Benign, Soft, non-tender. Renal:  No costovertebral tenderness  GU:  Not performed at this time. Endoc: No evident thyromegaly Skin:   warm, no rashes, no ecchymosis  Extremities: normal, no cyanosis, clubbing. PSYCHIATRIC: Mood, affect within normal limits.   ALL OTHER ROS ARE NEGATIVE     ASSESSMENT AND PLAN SYNOPSIS  86 year old white female seen today for follow-up COPD which was diagnosed many years ago No pulmonary function testing in the chart at this time She has mild to moderate disease based on her symptoms from previous visit Patient previously did not want to add any type of inhaler therapy but to increase her Advair to 2 times a day Patient was on Advair discus 100 twice daily  No evidence of exacerbation at this time No evidence of infection at this time No indication for steroids or antibiotics at this time  Patient has had increase in symptoms over the last several months with increased work of breathing and shortness of breath I have explained to her that she will need further testing with 6-minute walk test as well as overnight pulse oximetry to assess for exertional and nocturnal hypoxia I have also stated that she may need additional inhalers at this time but she is okay with Advair 100 twice daily as prescribed    COVID-19 EDUCATION: The signs and symptoms of COVID-19 were discussed with the patient and how to seek care for testing.  The importance of social distancing was discussed today. Hand Washing Techniques and avoid touching face was advised.     MEDICATION ADJUSTMENTS/LABS AND TESTS ORDERED: Continue Advair as prescribed Patient has been given our number to call back to let us  know when she is ready for 6-minute walk test pulmonary function test and overnight pulse oximetry   CURRENT MEDICATIONS REVIEWED AT LENGTH WITH PATIENT TODAY   Patient satisfied with Plan of action and management. All questions answered  Follow up in 6 months    Lemma Tetro Patricia Pesa, M.D.  [facility]  Medical Director [place] Director [facility] Cardio-Pulmonary Department

## 2019-11-30 NOTE — Telephone Encounter (Signed)
Pt dropped off MOST form to be reviewed and signed. Placed in [place] tower.

## 2019-11-30 NOTE — Patient Instructions (Addendum)
Continue Advair as prescribed   Please call [phone] Please let us know when you are ready for the tests

## 2019-11-30 NOTE — Telephone Encounter (Signed)
These are in your inbox

## 2019-12-06 ENCOUNTER — Other Ambulatory Visit: Payer: Self-pay | Admitting: Cardiovascular Disease

## 2019-12-07 ENCOUNTER — Telehealth: Payer: Self-pay | Admitting: Family Medicine

## 2019-12-07 MED ORDER — WARFARIN SODIUM 5 MG PO TABS: ORAL_TABLET | ORAL | 1 refills | Status: DC

## 2019-12-07 NOTE — Telephone Encounter (Signed)
Pt compliant with comadin management. Sent in script

## 2019-12-07 NOTE — Telephone Encounter (Signed)
Received a fax from [facility] for Warfarin refill.   Pt last seen 11/15/2019 - Warfarin dose adjusted.   Anticoagulation Summary As of 11/15/2019 INR goal:  2.0-3.0  TTR:  91.1 % (6.5 y)  INR used for dosing:  2.5 (11/15/2019)  Warfarin maintenance plan:  2.5 mg (5 mg x 0.5) every Wed; 5 mg (5 mg x 1) all other days  Weekly warfarin total:  32.5 mg  No change documented:  Randall An, RN  Plan last modified:  Diamond Nickel, RN (02/15/2019)  Next INR check:  12/13/2019      Please advise, thanks.

## 2019-12-08 NOTE — Telephone Encounter (Signed)
All copies signed and returned to [facility] for scanning. Please call patient regarding retrieving originals. Can be mailed or picked up.

## 2019-12-08 NOTE — Telephone Encounter (Signed)
Patient called to check status of paperwork.

## 2019-12-08 NOTE — Telephone Encounter (Signed)
Called to let pt know ppw is ready for pick up. Placed in yellow folder up front. Copy scanned in.

## 2019-12-09 NOTE — Progress Notes (Signed)
I have reviewed this visit and I agree on the patient's plan of dosage and recommendations. Jaysten Essner B Alice Vitelli, FNP   

## 2019-12-13 ENCOUNTER — Ambulatory Visit (INDEPENDENT_AMBULATORY_CARE_PROVIDER_SITE_OTHER): Payer: PPO

## 2019-12-13 DIAGNOSIS — Z7901 Long term (current) use of anticoagulants: Secondary | ICD-10-CM | POA: Diagnosis not present

## 2019-12-13 LAB — POCT INR: INR: 2.2 (ref 2.0–3.0)

## 2019-12-13 LAB — PROTIME-INR

## 2019-12-13 NOTE — Patient Instructions (Addendum)
Pre visit review using our clinic review tool, if applicable. No additional management support is needed unless otherwise documented below in the visit note.  Received results from [place], RN at [facility]. INR today is 2.2. No changes in dose, continue 5mg  daily and recheck in 4 wks. Advised pt of above instructions. Pt verbalized understanding.

## 2019-12-13 NOTE — Progress Notes (Signed)
I have reviewed this visit and I agree on the patient's plan of dosage and recommendations. Mckoy Bhakta B Cassian Torelli, FNP   

## 2019-12-20 ENCOUNTER — Telehealth: Payer: Self-pay | Admitting: Family Medicine

## 2019-12-20 NOTE — Telephone Encounter (Signed)
Please call patient and tell her to take one half of tablet of the levothyroxine 75 mcg.  We will recheck her blood work in 3 months to see where her thyroid studies are.  If she continues to have problems, please let us know.

## 2019-12-20 NOTE — Telephone Encounter (Signed)
That appointment will be fine for recheck of thyroid labs. Please let patient know.

## 2019-12-20 NOTE — Telephone Encounter (Signed)
I contacted patient and let her know of Debbie's recommendation and comments. Patient verbalized understanding.  Patient is scheduled for 6 month f/u on 05/10/20 - Debbie, is this appt ok or need to be moved? Or does she need a separate lab visit 3 months from now to check thyroid?

## 2019-12-20 NOTE — Telephone Encounter (Signed)
Pt states that since starting the new medication (Synthroid 43mcg) she has been having increased Anixety, apprehension and facial flushing. Pt states that she spoke with the pharmacy and they recommended her to talk to PCP or go to ED. Pt called the on call nurse with our office and she was given the same recommendations.   Pt states that her started shortly after her first dose of the higher dose - she started the new 74mcg tablet on 12/18/19. Symptoms started shortly after and she noticed she had trouble settling down. Pt was taking 13mcg tablets and dose was increased last OV but she did not start the new dose until she ran out of her 69mcg pills she had on hand -- she was taking 69mcg daily until 12/18/19 when she started the new tablet (73mcg)   Pt states that she has been taking 1/2 of the 37mcg - started Sunday 12/19/19 and feels fine.   Wants to know what to do next with medication  Please advise, thanks.

## 2019-12-21 NOTE — Telephone Encounter (Signed)
Called and left a detailed message making aware of appt date.   Nothing further needed.

## 2020-01-10 ENCOUNTER — Ambulatory Visit (INDEPENDENT_AMBULATORY_CARE_PROVIDER_SITE_OTHER): Payer: PPO

## 2020-01-10 DIAGNOSIS — Z7901 Long term (current) use of anticoagulants: Secondary | ICD-10-CM

## 2020-01-10 DIAGNOSIS — I482 Chronic atrial fibrillation, unspecified: Secondary | ICD-10-CM | POA: Diagnosis not present

## 2020-01-10 LAB — POCT INR: INR: 2.2 (ref 2.0–3.0)

## 2020-01-10 NOTE — Patient Instructions (Addendum)
Pre visit review using our clinic review tool, if applicable. No additional management support is needed unless otherwise documented below in the visit note.  INR today is 2.2. No changes in dose, continue 5mg  daily and recheck in 4 wks. Advised pt of above instructions. Pt verbalized understanding.

## 2020-01-11 NOTE — Progress Notes (Signed)
I have reviewed this visit and I agree on the patient's plan of dosage and recommendations. Ryleeann Urquiza B Eddye Broxterman, FNP   

## 2020-01-13 ENCOUNTER — Telehealth: Payer: Self-pay | Admitting: Internal Medicine

## 2020-01-13 NOTE — Telephone Encounter (Signed)
Spoke with pt. She would like to know the tests that Dr. Mortimer Fries wanted her to have at her last OV. From looking in the pt's last OV note, Dr. Mortimer Fries wanted her to have a PFT, walk test and ONO. Pt had several questions about what each test entails. I answered her questions to the best of my ability. Nothing further was needed.

## 2020-01-31 ENCOUNTER — Ambulatory Visit (INDEPENDENT_AMBULATORY_CARE_PROVIDER_SITE_OTHER): Payer: PPO | Admitting: Podiatry

## 2020-01-31 ENCOUNTER — Encounter: Payer: Self-pay | Admitting: Podiatry

## 2020-01-31 ENCOUNTER — Other Ambulatory Visit: Payer: Self-pay

## 2020-01-31 DIAGNOSIS — D689 Coagulation defect, unspecified: Secondary | ICD-10-CM

## 2020-01-31 DIAGNOSIS — M2010 Hallux valgus (acquired), unspecified foot: Secondary | ICD-10-CM

## 2020-01-31 DIAGNOSIS — M2041 Other hammer toe(s) (acquired), right foot: Secondary | ICD-10-CM | POA: Diagnosis not present

## 2020-01-31 DIAGNOSIS — M79675 Pain in left toe(s): Secondary | ICD-10-CM

## 2020-01-31 DIAGNOSIS — B351 Tinea unguium: Secondary | ICD-10-CM | POA: Diagnosis not present

## 2020-01-31 DIAGNOSIS — M79674 Pain in right toe(s): Secondary | ICD-10-CM

## 2020-01-31 DIAGNOSIS — M2042 Other hammer toe(s) (acquired), left foot: Secondary | ICD-10-CM

## 2020-01-31 NOTE — Progress Notes (Addendum)
This patient presents to the office for her previously scheduled preventative foot care services appointment.  She was very upset that she had to wait so long in the treatment room with a close door by herself.  She said she was waiting and she should have been told the reason she needed to wait was due to an emergency infection.  She said that since I was the doctor I was responsible for telling her the reason she had to wait.  This patient presents to the office for at risk foot care due to long-term use of anticoagulants for which she is taking Coumadin.  She also has a diagnosis of chronic venous insufficiency.  She says she is concerned about swelling in her right foot extending from the toes over the top to the ankle of the right foot.  She also has a long history of venous pathology for which she had surgery performed on her right leg in [place].  She has a difficult venous stasis skin and wears compression socks daily.  She also says there is generalized pain in the forefoot of both feet but especially her right foot. She has been diagnosed with foot drop right foot.    She presents the office today to discuss these matters and receive treatment for preventative foot care services. She desires additional footwear suggestions.  General Appearance  Alert, conversant and in no acute stress.  Vascular  Dorsalis pedis and posterior tibial  pulses are not  palpable  bilaterally.  Capillary return is within normal limits  bilaterally. Temperature is within normal limits  Bilaterally. Brownish venous stasis both legs and brown discoloration dorsum of both feet.    Neurologic  Senn-Weinstein monofilament wire test within normal limits  bilaterally. Muscle power within normal limits bilaterally.  Nails Thick disfigured discolored nails with subungual debris  from hallux to fifth toes bilaterally. No evidence of bacterial infection or drainage bilaterally.  Orthopedic  No limitations of motion  feet .  No  crepitus or effusions noted.  HAV 1st MPJ  B/L with hammer toes 2-5  B/L.  Skin  normotropic skin with no porokeratosis noted bilaterally.  No signs of infections or ulcers noted.  Corn fourth toe left foot.  HAV  B/L  Hammer toes 2-5  B/L  Onychomycosis  Generalized metatarsalgia  ROV.  Debride nails with nail nipper and dremel tool.  Discussed her feet with this patient.  Told her she has severe bunions and hammertoes in both forefeet which make the transfer of her weight during gait painful.  Told her the swelling in her right foot is due to her venous stasis diagnosis right leg.  Told her her feet would benefit from sandal wear but she says that she cannot wear sandals because they would interfere with the compression socks according to the vascular doctor I then told her that clogs would have a wide toe box.  I also suggested Spenco three-quarter orthotics to help to support and cushion her feet during gait.  I told her she can pick up these insoles prior to purchasing a new pair of sneakers which she seems to want.  Told her I can add a metatarsal pad to these insoles if she likes them.  Told her to return to the office to help allow me to put these insoles on her pads.  RTC 3 months for preventative foot care services.   Gardiner Barefoot DPM.

## 2020-02-07 DIAGNOSIS — I482 Chronic atrial fibrillation, unspecified: Secondary | ICD-10-CM | POA: Diagnosis not present

## 2020-02-07 LAB — POCT INR: INR: 1.7 — AB (ref 2.0–3.0)

## 2020-02-08 ENCOUNTER — Ambulatory Visit (INDEPENDENT_AMBULATORY_CARE_PROVIDER_SITE_OTHER): Payer: PPO

## 2020-02-08 DIAGNOSIS — Z7901 Long term (current) use of anticoagulants: Secondary | ICD-10-CM | POA: Diagnosis not present

## 2020-02-08 NOTE — Patient Instructions (Addendum)
Pre visit review using our clinic review tool, if applicable. No additional management support is needed unless otherwise documented below in the visit note.  INR today is 1.7. Increase dose today to 7.5mg  then continue 5mg  daily and recheck in 4 wks. Advised pt of above instructions. Pt verbalized understanding.

## 2020-02-15 ENCOUNTER — Telehealth: Payer: Self-pay

## 2020-02-15 ENCOUNTER — Telehealth: Payer: Self-pay | Admitting: Internal Medicine

## 2020-02-15 DIAGNOSIS — J449 Chronic obstructive pulmonary disease, unspecified: Secondary | ICD-10-CM

## 2020-02-15 NOTE — Telephone Encounter (Signed)
Patient contacted the office and states she has an appt on 05/10/20 to recheck her thyroid. Patient states she wants to go ahead and come in to have these labs rechecked. She states she had called at the beginning of April (see phone note) and she agreed to have labs checked within 6 month time frame, but she states she would like to come in now for labs? Jackelyn Poling, is this ok?

## 2020-02-16 ENCOUNTER — Other Ambulatory Visit: Payer: Self-pay | Admitting: Family Medicine

## 2020-02-16 DIAGNOSIS — E039 Hypothyroidism, unspecified: Secondary | ICD-10-CM

## 2020-02-16 NOTE — Telephone Encounter (Signed)
PFT Scheduled for Thurs 03/09/2020 at 9:00 at [facility]. Pt to arrive at 8:45 am. COVID Test scheduled for Wed. 03/08/2020 at the [facility] between hours of 8:00 am to 1:00 pm. Message sent to AG to schedule COVID Test. Pt is aware of both appointments and appointment dates of both mailed to patient.  Nothing else needed at this time. Rhonda J Cobb

## 2020-02-16 NOTE — Telephone Encounter (Signed)
Pt seen in March 2021. According to last OV note, Dr. Mortimer Fries told patient to call into the office when she was ready to schedule SMW, PFT and ONO.  ONO can not be scheduled at this time-has to be within 30 days of appointment.  SMW has been scheduled and I need an order for PFT at [facility] placed to schedule PFT.  Thanks. Rhonda J Cobb

## 2020-02-16 NOTE — Telephone Encounter (Signed)
Order for PFT placed.

## 2020-02-16 NOTE — Telephone Encounter (Signed)
Please call patient and let her know that it is fine for her to go ahead and have her thyroid test checked. Please schedule her a lab only visit.

## 2020-02-17 ENCOUNTER — Other Ambulatory Visit (INDEPENDENT_AMBULATORY_CARE_PROVIDER_SITE_OTHER): Payer: PPO

## 2020-02-17 ENCOUNTER — Other Ambulatory Visit: Payer: Self-pay

## 2020-02-17 DIAGNOSIS — E039 Hypothyroidism, unspecified: Secondary | ICD-10-CM | POA: Diagnosis not present

## 2020-02-17 LAB — TSH: TSH: 1.78 u[IU]/mL (ref 0.35–4.50)

## 2020-02-17 NOTE — Telephone Encounter (Signed)
Pt scheduled for lab appt today at 1:15  Nothing further needed.

## 2020-02-21 ENCOUNTER — Other Ambulatory Visit: Payer: Self-pay

## 2020-02-21 ENCOUNTER — Ambulatory Visit (INDEPENDENT_AMBULATORY_CARE_PROVIDER_SITE_OTHER): Payer: PPO | Admitting: Internal Medicine

## 2020-02-21 DIAGNOSIS — R0602 Shortness of breath: Secondary | ICD-10-CM

## 2020-02-21 NOTE — Progress Notes (Signed)
Six Minute Walk - 02/21/20 1123      Six Minute Walk   Medications taken before test (dose and time)  levothyroxine 6 am    Supplemental oxygen during test?  No    Lap distance in meters   34 meters    Laps Completed  8    Partial lap (in meters)  12 meters    Baseline BP (sitting)  124/76    Baseline Heartrate  96    Baseline Dyspnea (Borg Scale)  3    Baseline Fatigue (Borg Scale)  2    Baseline SPO2  95 %      End of Test Values    BP (sitting)  146/90    Heartrate  130    Dyspnea (Borg Scale)  4    Fatigue (Borg Scale)  3    SPO2  89 %      2 Minutes Post Walk Values   BP (sitting)  136/84    Heartrate  107    SPO2  97 %    Stopped or paused before six minutes?  No      Interpretation   Distance completed  284 meters

## 2020-02-23 ENCOUNTER — Encounter: Payer: Self-pay | Admitting: Family Medicine

## 2020-02-24 ENCOUNTER — Encounter: Payer: Self-pay | Admitting: Family Medicine

## 2020-02-28 ENCOUNTER — Other Ambulatory Visit: Payer: Self-pay | Admitting: Family Medicine

## 2020-02-28 MED ORDER — LEVOTHYROXINE SODIUM 50 MCG PO TABS: ORAL_TABLET | ORAL | 3 refills | Status: DC

## 2020-03-06 ENCOUNTER — Telehealth: Payer: Self-pay

## 2020-03-06 ENCOUNTER — Ambulatory Visit (INDEPENDENT_AMBULATORY_CARE_PROVIDER_SITE_OTHER): Payer: PPO

## 2020-03-06 DIAGNOSIS — I482 Chronic atrial fibrillation, unspecified: Secondary | ICD-10-CM | POA: Diagnosis not present

## 2020-03-06 DIAGNOSIS — Z7901 Long term (current) use of anticoagulants: Secondary | ICD-10-CM | POA: Diagnosis not present

## 2020-03-06 DIAGNOSIS — I4891 Unspecified atrial fibrillation: Secondary | ICD-10-CM

## 2020-03-06 LAB — POCT INR: INR: 2.4 (ref 2.0–3.0)

## 2020-03-06 NOTE — Patient Instructions (Addendum)
Pre visit review using our clinic review tool, if applicable. No additional management support is needed unless otherwise documented below in the visit note.  INR today is 2.4.  Continue 5mg  daily except take 2.5mg  on Wednesdays and recheck in 4 wks. Advised pt of above instructions. Pt verbalized understanding.

## 2020-03-06 NOTE — Telephone Encounter (Signed)
Pt is aware of date/time of covid test and voiced her understanding.  Nothing further is needed.  

## 2020-03-08 ENCOUNTER — Other Ambulatory Visit: Payer: Self-pay

## 2020-03-08 ENCOUNTER — Other Ambulatory Visit
Admission: RE | Admit: 2020-03-08 | Discharge: 2020-03-08 | Disposition: A | Discharge status: Home / Self Care | Payer: PPO | Source: Ambulatory Visit | Attending: Internal Medicine | Admitting: Internal Medicine

## 2020-03-08 DIAGNOSIS — Z01812 Encounter for preprocedural laboratory examination: Secondary | ICD-10-CM | POA: Insufficient documentation

## 2020-03-08 DIAGNOSIS — Z20822 Contact with and (suspected) exposure to covid-19: Secondary | ICD-10-CM | POA: Insufficient documentation

## 2020-03-08 LAB — SARS CORONAVIRUS 2 (TAT 6-24 HRS): SARS Coronavirus 2: NEGATIVE

## 2020-03-08 NOTE — Progress Notes (Signed)
I have reviewed this visit and I agree on the patient's plan of dosage and recommendations. Aerionna Moravek B Savhanna Sliva, FNP   

## 2020-03-09 ENCOUNTER — Other Ambulatory Visit: Payer: Self-pay

## 2020-03-09 ENCOUNTER — Ambulatory Visit: Payer: PPO | Attending: Internal Medicine

## 2020-03-09 DIAGNOSIS — J449 Chronic obstructive pulmonary disease, unspecified: Secondary | ICD-10-CM | POA: Insufficient documentation

## 2020-03-09 MED ORDER — ALBUTEROL SULFATE (2.5 MG/3ML) 0.083% IN NEBU
2.5000 mg | INHALATION_SOLUTION | Freq: Once | RESPIRATORY_TRACT | Status: AC
  Administered 2020-03-09: 2.5 mg via RESPIRATORY_TRACT
  Filled 2020-03-09: qty 3

## 2020-03-16 ENCOUNTER — Telehealth: Payer: Self-pay | Admitting: Internal Medicine

## 2020-03-16 DIAGNOSIS — J449 Chronic obstructive pulmonary disease, unspecified: Secondary | ICD-10-CM

## 2020-03-16 NOTE — Telephone Encounter (Signed)
Pt returning missed call. Can be reached at [phone]

## 2020-03-16 NOTE — Telephone Encounter (Signed)
Called and spoke to pt, who stated that she never hread from DME regarding ONO.  Per last OV note, Dr. Mortimer Fries suggested that pt have ONO, however this was not ordered. ONO has been ordered.  Nothing further is needed.

## 2020-03-16 NOTE — Telephone Encounter (Signed)
Lm for pt

## 2020-03-22 NOTE — Telephone Encounter (Signed)
Order can not be sent to DME until provider has signed order. Order wasn't signed until 03/21/2020 which means that this was only sent to [facility] yesterday as a STAT ONO.  [facility] has pulled the order to process, however, they need more than a day to arrange. Rhonda J Cobb

## 2020-03-22 NOTE — Telephone Encounter (Signed)
Pt is calling for update on ONO, as [facility] has not contacted her.  ONO was ordered on 03/16/2020 and signed by provider on 03/21/2020.  Suanne Marker, can you help with this?

## 2020-04-03 ENCOUNTER — Telehealth: Payer: Self-pay

## 2020-04-03 NOTE — Telephone Encounter (Signed)
Merlene Morse, RN from [facility], sent email that she missed pt's INR today and that she will test on 7/22 this wk.  Pt called and advised testing was missed. Advised testing on 7/22 is ok. Pt verbalized understanding.

## 2020-04-06 ENCOUNTER — Ambulatory Visit (INDEPENDENT_AMBULATORY_CARE_PROVIDER_SITE_OTHER): Payer: PPO

## 2020-04-06 DIAGNOSIS — D802 Selective deficiency of immunoglobulin A [IgA]: Secondary | ICD-10-CM | POA: Diagnosis not present

## 2020-04-06 DIAGNOSIS — I482 Chronic atrial fibrillation, unspecified: Secondary | ICD-10-CM | POA: Diagnosis not present

## 2020-04-06 DIAGNOSIS — Z7901 Long term (current) use of anticoagulants: Secondary | ICD-10-CM | POA: Diagnosis not present

## 2020-04-06 LAB — POCT INR: INR: 2.1 (ref 2.0–3.0)

## 2020-04-06 NOTE — Patient Instructions (Addendum)
Pre visit review using our clinic review tool, if applicable. No additional management support is needed unless otherwise documented below in the visit note.  INR today is 2.1.  Continue 5mg  daily except take 2.5mg  on Wednesdays and recheck in 4 wks. Advised pt of above instructions. Pt verbalized understanding.

## 2020-04-19 DIAGNOSIS — J449 Chronic obstructive pulmonary disease, unspecified: Secondary | ICD-10-CM | POA: Diagnosis not present

## 2020-04-21 ENCOUNTER — Telehealth: Payer: Self-pay

## 2020-04-21 NOTE — Telephone Encounter (Signed)
Lm for pt

## 2020-04-21 NOTE — Telephone Encounter (Signed)
ONO reviewed by Dr. Mortimer Fries- no oxygen needed at nighttime.

## 2020-04-21 NOTE — Telephone Encounter (Signed)
Pt is aware of results and voiced her understanding. Nothing further is needed.  

## 2020-04-24 ENCOUNTER — Encounter: Payer: Self-pay | Admitting: Family Medicine

## 2020-05-04 ENCOUNTER — Ambulatory Visit (INDEPENDENT_AMBULATORY_CARE_PROVIDER_SITE_OTHER): Payer: PPO | Admitting: Podiatry

## 2020-05-04 ENCOUNTER — Other Ambulatory Visit: Payer: Self-pay

## 2020-05-04 ENCOUNTER — Ambulatory Visit (INDEPENDENT_AMBULATORY_CARE_PROVIDER_SITE_OTHER): Payer: PPO

## 2020-05-04 ENCOUNTER — Encounter: Payer: Self-pay | Admitting: Podiatry

## 2020-05-04 DIAGNOSIS — M79675 Pain in left toe(s): Secondary | ICD-10-CM

## 2020-05-04 DIAGNOSIS — D689 Coagulation defect, unspecified: Secondary | ICD-10-CM | POA: Diagnosis not present

## 2020-05-04 DIAGNOSIS — B351 Tinea unguium: Secondary | ICD-10-CM

## 2020-05-04 DIAGNOSIS — M79674 Pain in right toe(s): Secondary | ICD-10-CM

## 2020-05-04 DIAGNOSIS — Z7901 Long term (current) use of anticoagulants: Secondary | ICD-10-CM | POA: Diagnosis not present

## 2020-05-04 DIAGNOSIS — I482 Chronic atrial fibrillation, unspecified: Secondary | ICD-10-CM | POA: Diagnosis not present

## 2020-05-04 DIAGNOSIS — M2010 Hallux valgus (acquired), unspecified foot: Secondary | ICD-10-CM

## 2020-05-04 DIAGNOSIS — D802 Selective deficiency of immunoglobulin A [IgA]: Secondary | ICD-10-CM | POA: Diagnosis not present

## 2020-05-04 LAB — POCT INR: INR: 1.9 — AB (ref 2.0–3.0)

## 2020-05-04 NOTE — Patient Instructions (Addendum)
Pre visit review using our clinic review tool, if applicable. No additional management support is needed unless otherwise documented below in the visit note.  Increase dose today to 7.5 mg then continue 5mg  daily except take 2.5mg  on Wednesdays and recheck in 3 wks.

## 2020-05-04 NOTE — Progress Notes (Signed)
Complaint:  Visit Type: Patient returns to my office for continued preventative foot care services. Complaint: Patient states" my nails have grown long and thick and become painful to walk and wear shoes" The patient presents for preventative foot care services.  Patient is taking coumadin.  Podiatric Exam: Vascular: dorsalis pedis and posterior tibial pulses are not  palpable bilateral. Capillary return is immediate. Temperature gradient is WNL. Skin turgor WNL .  Brown discoloration legs/foot  B/L. Sensorium: Normal Semmes Weinstein monofilament test. Normal tactile sensation bilaterally. Nail Exam: Pt has thick disfigured discolored nails with subungual debris noted bilateral entire nail hallux through fifth toenails Ulcer Exam: There is no evidence of ulcer or pre-ulcerative changes or infection. Orthopedic Exam: Muscle tone and strength are WNL. No limitations in general ROM. No crepitus or effusions noted. Foot type and digits show no abnormalities.HAV  B/L.    Hammer toes  2-4  B/L. Skin: No Porokeratosis. No infection or ulcers.    Diagnosis:  Onychomycosis, , Pain in right toe, pain in left toes  Treatment & Plan Procedures and Treatment: Consent by patient was obtained for treatment procedures.   Debridement of mycotic and hypertrophic toenails, 1 through 5 bilateral and clearing of subungual debris. No ulceration, no infection noted.      Return Visit-Office Procedure: Patient instructed to return to the office for a follow up visit 3 months for continued evaluation and treatment.    Gardiner Barefoot DPM

## 2020-05-08 NOTE — Progress Notes (Signed)
Cardiology Office Note  Date:  05/09/2020   ID:  [name], DOB 1931-09-17, MRN [id number]  PCP:  Elby Beck, FNP   Chief Complaint  Patient presents with  . other    12 month follow. Meds reviewed by the pt. verbally. Pt. c/o shortness of breath.     HPI:  [name] is a 86 year old woman with  chronic atrial fibrillation, on warfarin COPD,   40 years of smoking, hyperlipidemia rate control medications  Chronic leg pain She lives at [facility] [place]. who presents for routine followup of her atrial fibrillation.   In follow-up today, reports no new active issues Though overall feels she is " falling apart" "Balance, breathing"  Has completed lung studies under the guidance of pulmonary continued leg pain, on gabapentin, Managed by neurology Gait instability, periodic vertigo Previous visits with low BP Denies any orthostasis  Walking ok, active at [facility] Starting to use the exercise facilities  Labs reviewed INR 1.9  Doing exercise classes at [facility]  Has had 2 episodes where she has felt dizzy Was bending over, got dizzy, happened 2 times Happens more in the mornings, does not drink much fluid, wonders if it could be from dehydration   EKG personally reviewed by myself on todays visit Shows atrial fibrillation rate 87 bpm poor R wave progression through the anterior precordial leads, left axis deviation  Other past medical history reviewed chronic vertigo, balance issues, legs weak Previously seen by [name], Dr. Pryor Ochoa for vertigo  EKG personally reviewed by myself on todays visit Shows atrial fibrillation ventricular rate 87 bpm no significant ST or T wave changes, LAD  Other past medical history Prior cardiac notes from Dr. Sheppard Coil in [place] indicate she has chronic atrial fibrillation,  echocardiogram in 2010 and March 2014 confirming atrial fibrillation. No old EKGs are available.  Echocardiogram from March 2014 shows normal LV systolic  function, severely dilated left and right atrium, mildly elevated right ventricular systolic pressure estimated at 35 mm of mercury  PMH:   has a past medical history of Atrial fibrillation ([place]), COPD (chronic obstructive pulmonary disease) ([place]), Hypothyroidism, Microscopic colitis, Osteoporosis, Squamous cell carcinoma, and Syncope.  PSH:    Past Surgical History:  Procedure Laterality Date  . APPENDECTOMY    . CATARACT EXTRACTION Bilateral   . COLONOSCOPY    . LIPOMA EXCISION    . MULTIPLE TOOTH EXTRACTIONS    . REFRACTIVE SURGERY    . SQUAMOUS CELL CARCINOMA EXCISION  07/10/2017   left shin    Current Outpatient Medications  Medication Sig Dispense Refill  . Fluticasone-Salmeterol (ADVAIR DISKUS) 100-50 MCG/DOSE AEPB USE 1 INHALATION BY MOUTH TWICE DAILY 180 each 2  . gabapentin (NEURONTIN) 100 MG capsule Take 2 capsules (200 mg total) by mouth 3 (three) times daily. (Patient taking differently: Take 300 mg by mouth at bedtime. ) 270 capsule 1  . ipratropium (ATROVENT) 0.03 % nasal spray Place 2 sprays in each nostril three times a day as needed prior to activities that cause nose to run 30 mL 5  . levothyroxine (SYNTHROID) 75 MCG tablet SMARTSIG:1 Tablet(s) By Mouth    . verapamil (CALAN-SR) 120 MG CR tablet TAKE 1 TABLET(120 MG) BY MOUTH DAILY 90 tablet 2  . warfarin (COUMADIN) 5 MG tablet TAKE AS DIRECTED BY CLINIC 90 tablet 1   No current facility-administered medications for this visit.     Allergies:   Sulfa antibiotics   Social History:  The patient  reports that  she quit smoking about 12 years ago. Her smoking use included cigarettes. She has a 40.00 pack-year smoking history. She has never used smokeless tobacco. She reports previous alcohol use. She reports that she does not use drugs.   Family History:   family history includes Alzheimer's disease in her mother; Brain cancer in her father; Cancer (age of onset: 65) in her brother.    Review of Systems: Review of  Systems  HENT: Negative.   Respiratory: Negative.   Cardiovascular: Negative.   Gastrointestinal: Negative.   Musculoskeletal: Negative.        Poor balance, pain in her feet  Neurological: Negative.   Psychiatric/Behavioral: Negative.   All other systems reviewed and are negative.    PHYSICAL EXAM: VS:  BP 120/80 (BP Location: Left Arm, Patient Position: Sitting, Cuff Size: Normal)   Pulse 87   Ht 5' 8.03" (1.728 m)   Wt 133 lb 6 oz (60.5 kg)   BMI 20.26 kg/m  , BMI Body mass index is 20.26 kg/m.  Constitutional:  oriented to person, place, and time. No distress.  HENT:  Head: Grossly normal Eyes:  no discharge. No scleral icterus.  Neck: No JVD, no carotid bruits  Cardiovascular: irreg irreg, , no murmurs appreciated Pulmonary/Chest: Clear to auscultation bilaterally, no wheezes or rails Abdominal: Soft.  no distension.  no tenderness.  Musculoskeletal: Normal range of motion Neurological:  normal muscle tone. Coordination normal. No atrophy Skin: Skin warm and dry Psychiatric: normal affect, pleasant   Recent Labs: 11/03/2019: BUN 14; Creatinine, Ser 0.72; Hemoglobin 15.8; Platelets 186.0; Potassium 4.1; Sodium 136 02/17/2020: TSH 1.78    Lipid Panel Lab Results  Component Value Date   CHOL 220 (H) 11/03/2019   HDL 89.60 11/03/2019   LDLCALC 108 (H) 11/03/2019   TRIG 112.0 11/03/2019      Wt Readings from Last 3 Encounters:  05/09/20 133 lb 6 oz (60.5 kg)  11/30/19 138 lb 12.8 oz (63 kg)  11/08/19 136 lb 1.9 oz (61.7 kg)     ASSESSMENT AND PLAN:  Atrial fibrillation, unspecified type ([place]) - Plan: EKG 12-Lead Rate controlled, asymptomatic, No changes to her medications Blood pressure stable  HLD (hyperlipidemia) No known CAD or PAD Discussed cholesterol with her  Centrilobular emphysema (HCC) Stable, Followed by Dr. Mortimer Fries She has follow-up in September She is completed lots of testing, we have recommended she start a regular exercise program at  [facility]  Leg pain/neuropathy Followed by podiatry and neurology  Foot drop. Hammertoes  On Neurontin Recommended exercise program for gait stability    Total encounter time more than 25 minutes  Greater than 50% was spent in counseling and coordination of care with the patient    Orders Placed This Encounter  Procedures  . EKG 12-Lead     Signed, Esmond Plants, M.D., Ph.D. 05/09/2020  [place], [place]

## 2020-05-09 ENCOUNTER — Other Ambulatory Visit: Payer: Self-pay

## 2020-05-09 ENCOUNTER — Ambulatory Visit: Payer: PPO | Admitting: Cardiovascular Disease

## 2020-05-09 ENCOUNTER — Encounter: Payer: Self-pay | Admitting: Cardiovascular Disease

## 2020-05-09 VITALS — BP 120/80 | HR 87 | Ht 68.03 in | Wt 133.4 lb

## 2020-05-09 DIAGNOSIS — J449 Chronic obstructive pulmonary disease, unspecified: Secondary | ICD-10-CM | POA: Diagnosis not present

## 2020-05-09 DIAGNOSIS — E782 Mixed hyperlipidemia: Secondary | ICD-10-CM | POA: Diagnosis not present

## 2020-05-09 DIAGNOSIS — I872 Venous insufficiency (chronic) (peripheral): Secondary | ICD-10-CM

## 2020-05-09 DIAGNOSIS — J432 Centrilobular emphysema: Secondary | ICD-10-CM | POA: Diagnosis not present

## 2020-05-09 DIAGNOSIS — I482 Chronic atrial fibrillation, unspecified: Secondary | ICD-10-CM

## 2020-05-09 NOTE — Patient Instructions (Addendum)
Medication Instructions:  No changes  If you need a refill on your cardiac medications before your next appointment, please call your pharmacy.    Lab work: No new labs needed   If you have labs (blood work) drawn today and your tests are completely normal, you will receive your results only by: . MyChart Message (if you have MyChart) OR . A paper copy in the mail If you have any lab test that is abnormal or we need to change your treatment, we will call you to review the results.   Testing/Procedures: No new testing needed   Follow-Up: At [facility], you and your health needs are our priority.  As part of our continuing mission to provide you with exceptional heart care, we have created designated Provider Care Teams.  These Care Teams include your primary Cardiologist (physician) and Advanced Practice Providers (APPs -  Physician Assistants and Nurse Practitioners) who all work together to provide you with the care you need, when you need it.  . You will need a follow up appointment in 12 months  . Providers on your designated Care Team:   . Christopher Berge, NP . Ryan Dunn, PA-C . Jacquelyn Visser, PA-C  Any Other Special Instructions Will Be Listed Below (If Applicable).  COVID-19 Vaccine Information can be found at: [email or web address] For questions related to vaccine distribution or appointments, please email [email or web address] or call [phone].     

## 2020-05-10 ENCOUNTER — Ambulatory Visit (INDEPENDENT_AMBULATORY_CARE_PROVIDER_SITE_OTHER): Payer: PPO | Admitting: Family Medicine

## 2020-05-10 ENCOUNTER — Encounter: Payer: Self-pay | Admitting: Family Medicine

## 2020-05-10 VITALS — BP 118/72 | HR 75 | Temp 97.1°F | Ht 67.0 in | Wt 133.0 lb

## 2020-05-10 DIAGNOSIS — K439 Ventral hernia without obstruction or gangrene: Secondary | ICD-10-CM | POA: Diagnosis not present

## 2020-05-10 DIAGNOSIS — M25579 Pain in unspecified ankle and joints of unspecified foot: Secondary | ICD-10-CM | POA: Diagnosis not present

## 2020-05-10 DIAGNOSIS — Z7901 Long term (current) use of anticoagulants: Secondary | ICD-10-CM | POA: Diagnosis not present

## 2020-05-10 DIAGNOSIS — E039 Hypothyroidism, unspecified: Secondary | ICD-10-CM | POA: Diagnosis not present

## 2020-05-10 DIAGNOSIS — J432 Centrilobular emphysema: Secondary | ICD-10-CM

## 2020-05-10 LAB — CBC WITH DIFFERENTIAL/PLATELET
Basophils Absolute: 0.1 10*3/uL (ref 0.0–0.1)
Basophils Relative: 1 % (ref 0.0–3.0)
Eosinophils Absolute: 0.1 10*3/uL (ref 0.0–0.7)
Eosinophils Relative: 1.3 % (ref 0.0–5.0)
HCT: 45 % (ref 36.0–46.0)
Hemoglobin: 15.3 g/dL — ABNORMAL HIGH (ref 12.0–15.0)
Lymphocytes Relative: 25.1 % (ref 12.0–46.0)
Lymphs Abs: 1.3 10*3/uL (ref 0.7–4.0)
MCHC: 34 g/dL (ref 30.0–36.0)
MCV: 93.1 fl (ref 78.0–100.0)
Monocytes Absolute: 0.5 10*3/uL (ref 0.1–1.0)
Monocytes Relative: 9.2 % (ref 3.0–12.0)
Neutro Abs: 3.3 10*3/uL (ref 1.4–7.7)
Neutrophils Relative %: 63.4 % (ref 43.0–77.0)
Platelets: 189 10*3/uL (ref 150.0–400.0)
RBC: 4.83 Mil/uL (ref 3.87–5.11)
RDW: 14 % (ref 11.5–15.5)
WBC: 5.3 10*3/uL (ref 4.0–10.5)

## 2020-05-10 NOTE — Patient Instructions (Signed)
Good to see you today  Please follow up in 6 months   

## 2020-05-10 NOTE — Progress Notes (Signed)
Subjective:    Patient ID: [name], female    DOB: January 02, 1932, 86 y.o.   MRN: [id number]  HPI Chief Complaint  Patient presents with  . 6 Month Follow Up   This is an 86 yo female who presents today for follow up of chronic medical conditions.   Foot pain- had discussed decreasing daytime gabapentin due to irritability/ daytime sleepiness. Has had issues recently with heavy sleep then awakening from 1-4 am. Now she is taking 300 mg when she awakes in the middle of the night with good results. Some increased swelling of top of right foot with heat.  Resolves with elevation.  Left elbow pain- pain resolved  Rhinorrhea- improved with ipratropium, takes prn  Left hernia- unchanged, normal daily BM, notices it when she stands up, does not notice it with lying down.  No pain.  SOB- with walking uphill and playing cart with groceries, seen by pulmonary  Shingles vaccine- had Zostrix, has not had Shingrix. Has a history of possible shingles in past.   Her older sister recently had surgery with bowel removal for intussusception.  Patient reports that she would not want to have such an extensive surgery if something happened to her.  She does have a completed MOST form on file.  Review of Systems Denies chest pain, shortness of breath is as above, no abdominal pain, diarrhea/constipation.  No dysuria, hematuria, frequency.    Objective:   Physical Exam Physical Exam  Constitutional: Oriented to person, place, and time. Appears well-developed and well-nourished.  HENT:  Head: Normocephalic and atraumatic.  Eyes: Conjunctivae are normal.  Neck: Normal range of motion. Neck supple.  Cardiovascular: Normal rate, regular rhythm and normal heart sounds.   Pulmonary/Chest: Effort normal and breath sounds normal.  Abdomen: Flat, nontender.  Left lower quadrant fullness visible and palpable when she is in standing position.  Reducible.  It is neither palpable nor visible when she is  supine. Musculoskeletal: No lower extremity edema.   Neurological: Alert and oriented to person, place, and time.  Skin: Skin is warm and dry.  Psychiatric: Normal mood and affect. Behavior is normal. Judgment and thought content normal.  Vitals reviewed.    BP 118/72   Pulse 75   Temp (!) 97.1 F (36.2 C) (Temporal)   Ht 5\' 7"  (1.702 m)   Wt 133 lb (60.3 kg)   SpO2 97%   BMI 20.83 kg/m  Wt Readings from Last 3 Encounters:  05/10/20 133 lb (60.3 kg)  05/09/20 133 lb 6 oz (60.5 kg)  11/30/19 138 lb 12.8 oz (63 kg)        Assessment & Plan:  1. Long term (current) use of anticoagulants -No signs symptoms of bleeding - CBC with Differential  2. Pain in joint involving ankle and foot, unspecified laterality -No pain during the day, seems to be tolerating nighttime gabapentin with adequate relief of pain  3. Centrilobular emphysema ([place]) -Continue follow-up with pulmonary  4. Hypothyroidism, unspecified type -Not quite time for blood work.  She reports feeling better on levothyroxine 75 mcg  5. Ventral hernia without obstruction or gangrene -Continue to monitor.  Discussed follow-up if area becomes painful, change in bowel movements, area with protrusion that cannot be reduced  -Follow-up in 6 months  This visit occurred during the SARS-CoV-2 public health emergency.  Safety protocols were in place, including screening questions prior to the visit, additional usage of staff PPE, and extensive cleaning of exam room while observing appropriate contact  time as indicated for disinfecting solutions.      Clarene Reamer, FNP-BC  [facility] at [facility], [facility]  05/10/2020 1:00 PM

## 2020-05-12 ENCOUNTER — Other Ambulatory Visit: Payer: Self-pay | Admitting: Family Medicine

## 2020-05-12 NOTE — Telephone Encounter (Signed)
Please advise on dose and instructions.

## 2020-05-29 DIAGNOSIS — D802 Selective deficiency of immunoglobulin A [IgA]: Secondary | ICD-10-CM | POA: Diagnosis not present

## 2020-05-29 DIAGNOSIS — I482 Chronic atrial fibrillation, unspecified: Secondary | ICD-10-CM | POA: Diagnosis not present

## 2020-05-29 LAB — POCT INR: INR: 2.6 (ref 2.0–3.0)

## 2020-05-30 ENCOUNTER — Ambulatory Visit (INDEPENDENT_AMBULATORY_CARE_PROVIDER_SITE_OTHER): Payer: PPO

## 2020-05-30 DIAGNOSIS — Z7901 Long term (current) use of anticoagulants: Secondary | ICD-10-CM

## 2020-05-30 NOTE — Patient Instructions (Addendum)
Pre visit review using our clinic review tool, if applicable. No additional management support is needed unless otherwise documented below in the visit note.  Continue 5mg  daily except take 2.5mg  on Wednesdays and recheck in 34wks. Advised pt of above instructions. Pt verbalized understanding. Pt does not want AVS mailed.  [facility] was notified concerning when pt needed retested.

## 2020-06-14 ENCOUNTER — Other Ambulatory Visit: Payer: Self-pay

## 2020-06-14 ENCOUNTER — Ambulatory Visit: Payer: PPO | Admitting: Internal Medicine

## 2020-06-14 ENCOUNTER — Encounter: Payer: Self-pay | Admitting: Internal Medicine

## 2020-06-14 VITALS — BP 96/66 | HR 93 | Temp 97.1°F | Ht 68.0 in | Wt 131.4 lb

## 2020-06-14 DIAGNOSIS — J449 Chronic obstructive pulmonary disease, unspecified: Secondary | ICD-10-CM

## 2020-06-14 NOTE — Progress Notes (Signed)
Name: [name] MRN: [id number] DOB: [birth year]     CONSULTATION DATE: 06/14/2020  REFERRING MD : Carlean Purl   Synopsis 86 year old white female chronic A. fib on warfarin History of COPD 40 years smoking Quit smoking 2009 Advair discus 100 once a day  Chief complaint Follow-up assessment for COPD    HISTORY OF PRESENT ILLNESS: Chronic shortness of breath and dyspnea exertion Seems to be stable over the last several months ONO and ^ MWT did NOT reveal Hypoxia  PFT's 02/2020 Shows FEV1 61% predicted and moderate to severe Obstructive lung disease  Jan 2018 CXR reviewed with patient   No exacerbation at this time No evidence of heart failure at this time No evidence or signs of infection at this time No respiratory distress No fevers, chills, nausea, vomiting, diarrhea No evidence of lower extremity edema No evidence hemoptysis    PATIENT QUIT SMOKING 2002   PAST MEDICAL HISTORY :   has a past medical history of Atrial fibrillation ([place]), COPD (chronic obstructive pulmonary disease) ([place]), Hypothyroidism, Microscopic colitis, Osteoporosis, Squamous cell carcinoma, and Syncope.  has a past surgical history that includes Appendectomy; Lipoma excision; Cataract extraction (Bilateral); Colonoscopy; Multiple tooth extractions; Refractive surgery; and Squamous cell carcinoma excision (07/10/2017). Prior to Admission medications   Medication Sig Start Date End Date Taking? Authorizing Provider  ADVAIR DISKUS 100-50 MCG/DOSE AEPB USE 1 INHALATION BY MOUTH TWICE DAILY 03/20/18   Elby Beck, FNP  Cholecalciferol (VITAMIN D-3) 1000 UNITS CAPS Take 1,000 Units by mouth every other day.     [provider]  digoxin (LANOXIN) 0.125 MG tablet Take 1 tablet (125 mcg total) by mouth daily. 06/14/19   Minna Merritts, MD  gabapentin (NEURONTIN) 100 MG capsule Take 100 mg by mouth. Pt takes 2 in the am and 2 at night 11/27/16   [provider]  levothyroxine  (SYNTHROID) 50 MCG tablet TAKE 1 TABLET BY MOUTH DAILY, THEN 1/2 TABLET THE NEXT DAY ALTERNATING 01/29/19   Elby Beck, FNP  verapamil (CALAN-SR) 120 MG CR tablet TAKE 1 TABLET(120 MG) BY MOUTH DAILY 06/14/19   Minna Merritts, MD  warfarin (COUMADIN) 5 MG tablet TAKE AS DIRECTED BY CLINIC 06/16/19   Elby Beck, FNP   Allergies  Allergen Reactions  . Sulfa Antibiotics Nausea And Vomiting    FAMILY HISTORY:  family history includes Alzheimer's disease in her mother; Brain cancer in her father; Cancer (age of onset: 69) in her brother. SOCIAL HISTORY:  reports that she quit smoking about 12 years ago. Her smoking use included cigarettes. She has a 40.00 pack-year smoking history. She has never used smokeless tobacco. She reports previous alcohol use. She reports that she does not use drugs.    Review of Systems:  Gen:  Denies  fever, sweats, chills weight loss  HEENT: Denies blurred vision, double vision, ear pain, eye pain, hearing loss, nose bleeds, sore throat Cardiac:  No dizziness, chest pain or heaviness, chest tightness,edema, No JVD Resp:   No cough, -sputum production, -shortness of breath,-wheezing, -hemoptysis,  Gi: Denies swallowing difficulty, stomach pain, nausea or vomiting, diarrhea, constipation, bowel incontinence Gu:  Denies bladder incontinence, burning urine Ext:   Denies Joint pain, stiffness or swelling Skin: Denies  skin rash, easy bruising or bleeding or hives Endoc:  Denies polyuria, polydipsia , polyphagia or weight change Psych:   Denies depression, insomnia or hallucinations  Other:  All other systems negative  BP 96/66 (BP Location: Left Arm, Patient Position: Sitting,  Cuff Size: Normal)   Pulse 93   Temp (!) 97.1 F (36.2 C) (Temporal)   Ht 5\' 8"  (1.727 m)   Wt 131 lb 6.4 oz (59.6 kg)   SpO2 97%   BMI 19.98 kg/m   Physical Examination:   General Appearance: No distress  Neuro:without focal findings,  speech normal,  HEENT:  PERRLA, EOM intact.   Pulmonary: normal breath sounds, No wheezing.  CardiovascularNormal S1,S2.  No m/r/g.   Abdomen: Benign, Soft, non-tender. Renal:  No costovertebral tenderness  GU:  Not performed at this time. Endoc: No evident thyromegaly Skin:   warm, no rashes, no ecchymosis  Extremities: normal, no cyanosis, clubbing. PSYCHIATRIC: Mood, affect within normal limits.   ALL OTHER ROS ARE NEGATIVE     ASSESSMENT AND PLAN SYNOPSIS  86 year old white female seen today for follow-up COPD  MODERATE TO SEVERE COPD Controlled with ADVAIR 100 once dailue  No evidence of exacerbation at this time No evidence of infection at this time No indication for steroids or antibiotics at this time  COPD seems to be stable at this time She continues to use Advair as prescribed    COVID-19 EDUCATION: The signs and symptoms of COVID-19 were discussed with the patient and how to seek care for testing.  The importance of social distancing was discussed today. Hand Washing Techniques and avoid touching face was advised.     MEDICATION ADJUSTMENTS/LABS AND TESTS ORDERED: Continue Advair as prescribed  CURRENT MEDICATIONS REVIEWED AT LENGTH WITH PATIENT TODAY   Patient satisfied with Plan of action and management. All questions answered  Follow up in 1 year  Total time spent 33 mins  ALL TESTA ND PREVIOUS CXR REVIEWED IN DETAIL WITH PATIENT   Corrin Parker, M.D.  [facility]  Medical Director [place] Director [facility] Cardio-Pulmonary Department

## 2020-06-14 NOTE — Patient Instructions (Signed)
CONTINUE INHALERS AS PRESCRIBED

## 2020-06-26 DIAGNOSIS — I482 Chronic atrial fibrillation, unspecified: Secondary | ICD-10-CM | POA: Diagnosis not present

## 2020-06-26 DIAGNOSIS — D802 Selective deficiency of immunoglobulin A [IgA]: Secondary | ICD-10-CM | POA: Diagnosis not present

## 2020-06-26 LAB — PROTIME-INR

## 2020-06-26 LAB — POCT INR: INR: 3.2 — AB (ref 2.0–3.0)

## 2020-06-28 ENCOUNTER — Ambulatory Visit (INDEPENDENT_AMBULATORY_CARE_PROVIDER_SITE_OTHER): Payer: PPO

## 2020-06-28 DIAGNOSIS — Z7901 Long term (current) use of anticoagulants: Secondary | ICD-10-CM

## 2020-06-28 NOTE — Patient Instructions (Addendum)
Pre visit review using our clinic review tool, if applicable. No additional management support is needed unless otherwise documented below in the visit note.  Hold dose today then continue 5mg  daily except take 2.5mg  on Wednesdays and recheck in 3 wks. Advised pt of above instructions. Pt verbalized understanding. Pt does not want AVS mailed.

## 2020-06-29 NOTE — Progress Notes (Signed)
I have reviewed this visit and I agree on the patient's plan of dosage and recommendations. Nicosha Struve B Riyanna Crutchley, FNP   

## 2020-07-12 DIAGNOSIS — Z961 Presence of intraocular lens: Secondary | ICD-10-CM | POA: Diagnosis not present

## 2020-07-19 ENCOUNTER — Other Ambulatory Visit: Payer: Self-pay | Admitting: Cardiovascular Disease

## 2020-07-19 ENCOUNTER — Other Ambulatory Visit: Payer: Self-pay | Admitting: Family Medicine

## 2020-07-24 DIAGNOSIS — I482 Chronic atrial fibrillation, unspecified: Secondary | ICD-10-CM | POA: Diagnosis not present

## 2020-07-24 DIAGNOSIS — D802 Selective deficiency of immunoglobulin A [IgA]: Secondary | ICD-10-CM | POA: Diagnosis not present

## 2020-07-24 LAB — POCT INR
INR: 3 (ref 2.0–3.0)
INR: 3 — AB (ref 0.9–1.1)

## 2020-07-24 LAB — PROTIME-INR: Protime: 30.5 — AB (ref 10.0–13.8)

## 2020-07-25 ENCOUNTER — Ambulatory Visit (INDEPENDENT_AMBULATORY_CARE_PROVIDER_SITE_OTHER): Payer: PPO

## 2020-07-25 DIAGNOSIS — Z7901 Long term (current) use of anticoagulants: Secondary | ICD-10-CM

## 2020-07-25 NOTE — Patient Instructions (Addendum)
Pre visit review using our clinic review tool, if applicable. No additional management support is needed unless otherwise documented below in the visit note.   Continue 5mg  daily except take 2.5mg  on Wednesdays and recheck in 4 wks. Advised pt of above instructions. Pt verbalized understanding. Pt does not want AVS mailed.  [facility] was notified concerning when pt needed retested.

## 2020-07-25 NOTE — Progress Notes (Signed)
Patient called and updated of her INR. No changes were made. Patient stated that she would re test her INR at [facility] on 08/21/2020. Patient verbalized understanding. No further questions or concerns. Estanislado Emms, RN stated that she would let the nurse at [facility] know of the patients next INR check.

## 2020-08-02 ENCOUNTER — Encounter: Payer: Self-pay | Admitting: Family Medicine

## 2020-08-07 ENCOUNTER — Ambulatory Visit: Payer: PPO | Admitting: Podiatry

## 2020-08-07 ENCOUNTER — Other Ambulatory Visit: Payer: Self-pay

## 2020-08-07 ENCOUNTER — Encounter: Payer: Self-pay | Admitting: Podiatry

## 2020-08-07 DIAGNOSIS — M79674 Pain in right toe(s): Secondary | ICD-10-CM | POA: Diagnosis not present

## 2020-08-07 DIAGNOSIS — M2042 Other hammer toe(s) (acquired), left foot: Secondary | ICD-10-CM

## 2020-08-07 DIAGNOSIS — M79675 Pain in left toe(s): Secondary | ICD-10-CM

## 2020-08-07 DIAGNOSIS — B351 Tinea unguium: Secondary | ICD-10-CM

## 2020-08-07 DIAGNOSIS — D689 Coagulation defect, unspecified: Secondary | ICD-10-CM

## 2020-08-07 DIAGNOSIS — G629 Polyneuropathy, unspecified: Secondary | ICD-10-CM

## 2020-08-07 DIAGNOSIS — M2041 Other hammer toe(s) (acquired), right foot: Secondary | ICD-10-CM

## 2020-08-07 DIAGNOSIS — M2010 Hallux valgus (acquired), unspecified foot: Secondary | ICD-10-CM

## 2020-08-07 NOTE — Progress Notes (Signed)
This patient returns to my office for at risk foot care.  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  Patient is taking coumadin.  This patient is unable to cut nails herself since the patient cannot reach her nails.These nails are painful walking and wearing shoes.  This patient presents for at risk foot care today.  General Appearance  Alert, conversant and in no acute stress.  Vascular  Dorsalis pedis and posterior tibial  pulses are not  palpable  bilaterally.  Capillary return is within normal limits  bilaterally. Cold feet.  Absent hair.  Venous stasis dermatitis  Legs  B/L. bilaterally.  Neurologic  Senn-Weinstein monofilament wire test within normal limits  bilaterally. Muscle power within normal limits bilaterally.  Nails Thick disfigured discolored nails with subungual debris  from hallux to fifth toes bilaterally. No evidence of bacterial infection or drainage bilaterally.  Orthopedic  No limitations of motion  feet .  No crepitus or effusions noted.  Hammer toes 2-4  B/L.  Skin  normotropic skin with no porokeratosis noted bilaterally.  No signs of infections or ulcers noted.     Onychomycosis  Pain in right toes  Pain in left toes  Consent was obtained for treatment procedures.   Mechanical debridement of nails 1-5  bilaterally performed with a nail nipper.  Filed with dremel without incident.    Return office visit   4 months                  Told patient to return for periodic foot care and evaluation due to potential at risk complications.   Gardiner Barefoot DPM

## 2020-08-21 DIAGNOSIS — I482 Chronic atrial fibrillation, unspecified: Secondary | ICD-10-CM | POA: Diagnosis not present

## 2020-08-21 DIAGNOSIS — Z7901 Long term (current) use of anticoagulants: Secondary | ICD-10-CM | POA: Diagnosis not present

## 2020-08-21 LAB — POCT INR: INR: 2.9 (ref 2.0–3.0)

## 2020-08-22 ENCOUNTER — Ambulatory Visit (INDEPENDENT_AMBULATORY_CARE_PROVIDER_SITE_OTHER): Payer: PPO

## 2020-08-22 DIAGNOSIS — Z7901 Long term (current) use of anticoagulants: Secondary | ICD-10-CM | POA: Diagnosis not present

## 2020-08-22 NOTE — Patient Instructions (Addendum)
Pre visit review using our clinic review tool, if applicable. No additional management support is needed unless otherwise documented below in the visit note.  Continue 5mg  daily except take 2.5mg  on Wednesdays and recheck in 4 wks. Advised pt of above instructions.

## 2020-08-28 ENCOUNTER — Encounter: Payer: Self-pay | Admitting: Family Medicine

## 2020-09-01 ENCOUNTER — Telehealth: Payer: Self-pay | Admitting: Family Medicine

## 2020-09-01 NOTE — Telephone Encounter (Signed)
Pt called in wanted to let Carlean Purl know about going to [facility] or Dr.Cody

## 2020-09-04 NOTE — Telephone Encounter (Signed)
Please call patient and tell her that she would get excellent care from either Dr. Diona Browner or Dr. Einar Pheasant.  Dr. Diona Browner may have earlier availability for a transfer of care appointment.  I would suggest that she start with her.

## 2020-09-05 NOTE — Telephone Encounter (Signed)
Noted. Thank you for taking care of this. °

## 2020-09-05 NOTE — Telephone Encounter (Signed)
Contacted pt and advised. Pt would like to transfer to Dr Diona Browner and stay on the same date she has AWV with Jackelyn Poling on 2/25. She also has health nurse and lab apt on 2/18. Apt with Debbie on 2/25 has been rescheduled with Dr. Diona Browner and health nurse and lab apt have been left for 2/18. Pt verbalized understanding.

## 2020-09-12 ENCOUNTER — Other Ambulatory Visit: Payer: Self-pay | Admitting: Internal Medicine

## 2020-09-18 ENCOUNTER — Telehealth: Payer: Self-pay | Admitting: Family Medicine

## 2020-09-18 DIAGNOSIS — I482 Chronic atrial fibrillation, unspecified: Secondary | ICD-10-CM | POA: Diagnosis not present

## 2020-09-18 DIAGNOSIS — Z7901 Long term (current) use of anticoagulants: Secondary | ICD-10-CM | POA: Diagnosis not present

## 2020-09-18 LAB — POCT INR: INR: 2.2 (ref 2.0–3.0)

## 2020-09-18 LAB — PROTIME-INR: INR: 2.2 — AB (ref 0.9–1.1)

## 2020-09-18 NOTE — Telephone Encounter (Signed)
Patient states that she has some questions about her medication. Its a lower dosage and not the usual amt she gets.  Medication: Levothyroxine.  Patient states there is a discrepency in it

## 2020-09-18 NOTE — Telephone Encounter (Signed)
Contacted [facility] and talked to Tiffany who reports pt has been picking up the and the 75 mcg doses for almost an entire year. There was also a new script called in by Deboraha Sprang on 12/28 for the 25 mcg. They last dispensed the to the pt.   Contacted pt and she reports she has not picked up the recently except for the last script that was called in incorrectly and given to her incorrectly by the pharmacy. She said she has to pay for the med also because they will not take it back. She said she has been taking 75 mcg since Feb 2021 and she knows Eunice Blase would not have called in and she thinks the pharmacy may be making things up. She said she still has a refill on the so she is not sure why they even requested a refill on the . She is going to request a refill on the and if she has any problem she will contact this nurse. Pt also reported she had her INR tested today. Advised this nurse will f/u with her tomorrow. Pt verbalized understanding.   No email was received with the INR result today.

## 2020-09-19 ENCOUNTER — Ambulatory Visit (INDEPENDENT_AMBULATORY_CARE_PROVIDER_SITE_OTHER): Payer: PPO

## 2020-09-19 DIAGNOSIS — Z7901 Long term (current) use of anticoagulants: Secondary | ICD-10-CM

## 2020-09-19 DIAGNOSIS — I4891 Unspecified atrial fibrillation: Secondary | ICD-10-CM

## 2020-09-19 NOTE — Patient Instructions (Signed)
Pre visit review using our clinic review tool, if applicable. No additional management support is needed unless otherwise documented below in the visit note. 

## 2020-09-20 NOTE — Telephone Encounter (Signed)
Received INR result and spoke to pt about result and levothyroxine. Pt is ok with refill on . Nothing further is needed.

## 2020-09-20 NOTE — Progress Notes (Signed)
Agree. Thanks

## 2020-09-25 ENCOUNTER — Telehealth: Payer: Self-pay | Admitting: Internal Medicine

## 2020-09-25 NOTE — Telephone Encounter (Signed)
Spoke to patient, who stated that she received generic of advair.  Rx for Advair was sent to [facility] on 09/13/2020. Patient stated that she was advised by both insurance and [facility] to contact our office to see why generic was prescribed.  I have made patient aware that Advair was prescribed on 09/13/2020. She requested that I contact [facility] to discuss this further. I contacted [facility] and was advised that their computer system automatically runs Rx as generic. If patient would like brand name, she must let the pharmacy know.   Called patient back and relayed message from [facility].  She voiced her understanding and had no further questions.  Nothing further needed.

## 2020-10-04 DIAGNOSIS — D2272 Melanocytic nevi of left lower limb, including hip: Secondary | ICD-10-CM | POA: Diagnosis not present

## 2020-10-04 DIAGNOSIS — D2261 Melanocytic nevi of right upper limb, including shoulder: Secondary | ICD-10-CM | POA: Diagnosis not present

## 2020-10-04 DIAGNOSIS — L821 Other seborrheic keratosis: Secondary | ICD-10-CM | POA: Diagnosis not present

## 2020-10-04 DIAGNOSIS — D225 Melanocytic nevi of trunk: Secondary | ICD-10-CM | POA: Diagnosis not present

## 2020-10-04 DIAGNOSIS — D2262 Melanocytic nevi of left upper limb, including shoulder: Secondary | ICD-10-CM | POA: Diagnosis not present

## 2020-10-04 DIAGNOSIS — Z85828 Personal history of other malignant neoplasm of skin: Secondary | ICD-10-CM | POA: Diagnosis not present

## 2020-10-12 ENCOUNTER — Telehealth: Payer: Self-pay | Admitting: Family Medicine

## 2020-10-12 NOTE — Telephone Encounter (Signed)
LVM for pt to rtn my call to r/s appt with nha on 11/03/20

## 2020-10-14 DIAGNOSIS — E782 Mixed hyperlipidemia: Secondary | ICD-10-CM

## 2020-10-14 DIAGNOSIS — E039 Hypothyroidism, unspecified: Secondary | ICD-10-CM

## 2020-10-16 ENCOUNTER — Ambulatory Visit (INDEPENDENT_AMBULATORY_CARE_PROVIDER_SITE_OTHER): Payer: PPO

## 2020-10-16 DIAGNOSIS — Z7901 Long term (current) use of anticoagulants: Secondary | ICD-10-CM

## 2020-10-16 DIAGNOSIS — I482 Chronic atrial fibrillation, unspecified: Secondary | ICD-10-CM | POA: Diagnosis not present

## 2020-10-16 LAB — POCT INR: INR: 3.4 — AB (ref 2.0–3.0)

## 2020-10-16 NOTE — Patient Instructions (Addendum)
Pre visit review using our clinic review tool, if applicable. No additional management support is needed unless otherwise documented below in the visit note.  Advised to hold dose tomorrow and change weekly dose to take 5mg  daily except take 2.5mg  on Wednesdays and Sundays and recheck in 3 wks. Advised pt of above instructions. Pt verbalized understanding. Pt does not want AVS mailed.

## 2020-10-23 NOTE — Telephone Encounter (Signed)
-----   Message from Ellamae Sia sent at 10/17/2020  2:55 PM EST ----- Regarding: Lab orders for Friday, 2.18.22 Patient is scheduled for CPX labs, please order future labs, Thanks , Karna Christmas

## 2020-11-03 ENCOUNTER — Other Ambulatory Visit (INDEPENDENT_AMBULATORY_CARE_PROVIDER_SITE_OTHER): Payer: PPO

## 2020-11-03 ENCOUNTER — Other Ambulatory Visit: Payer: Self-pay

## 2020-11-03 ENCOUNTER — Ambulatory Visit: Payer: PPO

## 2020-11-03 ENCOUNTER — Telehealth: Payer: Self-pay

## 2020-11-03 DIAGNOSIS — E039 Hypothyroidism, unspecified: Secondary | ICD-10-CM

## 2020-11-03 DIAGNOSIS — E782 Mixed hyperlipidemia: Secondary | ICD-10-CM

## 2020-11-03 LAB — COMPREHENSIVE METABOLIC PANEL
ALT: 24 U/L (ref 0–35)
AST: 31 U/L (ref 0–37)
Albumin: 4.4 g/dL (ref 3.5–5.2)
Alkaline Phosphatase: 100 U/L (ref 39–117)
BUN: 14 mg/dL (ref 6–23)
CO2: 28 mEq/L (ref 19–32)
Calcium: 10.3 mg/dL (ref 8.4–10.5)
Chloride: 100 mEq/L (ref 96–112)
Creatinine, Ser: 0.79 mg/dL (ref 0.40–1.20)
GFR: 67.52 mL/min (ref >60.00)
Glucose, Bld: 87 mg/dL (ref 70–99)
Potassium: 4.5 mEq/L (ref 3.5–5.1)
Sodium: 136 mEq/L (ref 135–145)
Total Bilirubin: 1.1 mg/dL (ref 0.2–1.2)
Total Protein: 7.3 g/dL (ref 6.0–8.3)

## 2020-11-03 LAB — LIPID PANEL
Cholesterol: 221 mg/dL — ABNORMAL HIGH (ref 0–200)
HDL: 85.7 mg/dL (ref >39.00)
LDL Cholesterol: 117 mg/dL — ABNORMAL HIGH (ref 0–99)
NonHDL: 135.3
Total CHOL/HDL Ratio: 3
Triglycerides: 92 mg/dL (ref 0.0–149.0)
VLDL: 18.4 mg/dL (ref 0.0–40.0)

## 2020-11-03 LAB — TSH: TSH: 1.42 u[IU]/mL (ref 0.35–4.50)

## 2020-11-03 LAB — T4, FREE: Free T4: 1.33 ng/dL (ref 0.60–1.60)

## 2020-11-03 LAB — T3, FREE: T3, Free: 3.2 pg/mL (ref 2.3–4.2)
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
## 2020-11-03 NOTE — Progress Notes (Signed)
No critical labs need to be addressed urgently. We will discuss labs in detail at upcoming office visit.   

## 2020-11-03 NOTE — Telephone Encounter (Signed)
Noted  

## 2020-11-06 ENCOUNTER — Ambulatory Visit (INDEPENDENT_AMBULATORY_CARE_PROVIDER_SITE_OTHER): Payer: PPO

## 2020-11-06 DIAGNOSIS — Z7901 Long term (current) use of anticoagulants: Secondary | ICD-10-CM

## 2020-11-06 DIAGNOSIS — I482 Chronic atrial fibrillation, unspecified: Secondary | ICD-10-CM | POA: Diagnosis not present

## 2020-11-06 LAB — POCT INR: INR: 2.4 (ref 2.0–3.0)

## 2020-11-06 NOTE — Patient Instructions (Addendum)
Pre visit review using our clinic review tool, if applicable. No additional management support is needed unless otherwise documented below in the visit note.  Advised to continue taking 5mg  daily except take 2.5mg  on Wednesdays and Sundays and recheck in 4 wks. Advised pt of above instructions. Pt verbalized understanding. Pt does not want AVS mailed.
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
## 2020-11-07 NOTE — Telephone Encounter (Signed)
Noted  

## 2020-11-10 ENCOUNTER — Encounter: Payer: PPO | Admitting: Family Medicine

## 2020-11-14 ENCOUNTER — Other Ambulatory Visit: Payer: Self-pay

## 2020-11-14 ENCOUNTER — Ambulatory Visit (INDEPENDENT_AMBULATORY_CARE_PROVIDER_SITE_OTHER): Payer: PPO

## 2020-11-14 DIAGNOSIS — Z0000 Encounter for general adult medical examination without abnormal findings: Secondary | ICD-10-CM | POA: Diagnosis not present

## 2020-11-14 NOTE — Progress Notes (Signed)
Subjective:   [name] is a 87 y.o. female who presents for Medicare Annual (Subsequent) preventive examination.  Review of Systems: N/A      I connected with the patient today by telephone and verified that I am speaking with the correct person using two identifiers. Location patient: home Location nurse: work Persons participating in the telephone visit: patient, nurse.   I discussed the limitations, risks, security and privacy concerns of performing an evaluation and management service by telephone and the availability of in person appointments. I also discussed with the patient that there may be a patient responsible charge related to this service. The patient expressed understanding and verbally consented to this telephonic visit.        Cardiac Risk Factors include: advanced age (>35men, >77 women);Other (see comment), Risk factor comments: hyperlipidemia     Objective:    Today's Vitals   There is no height or weight on file to calculate BMI.  Advanced Directives 11/14/2020 11/02/2019 10/27/2018 10/21/2017 01/31/2017 12/16/2016 10/07/2016  Does Patient Have a Medical Advance Directive? Yes Yes Yes Yes Yes Yes Yes  Type of Paramedic of [place];Living will [place];Living will [place];Living will [place];Living will [place];Living will [place];Living will [place];Living will  Copy of [place] in Chart? Yes - validated most recent copy scanned in chart (See row information) No - copy requested No - copy requested No - copy requested - - No - copy requested    Current Medications (verified) Outpatient Encounter Medications as of 11/14/2020  Medication Sig  . Fluticasone-Salmeterol (ADVAIR) 100-50 MCG/DOSE AEPB INHALE 1 PUFF BY MOUTH TWICE DAILY  . Fluticasone-Salmeterol (ADVAIR) 100-50 MCG/DOSE AEPB INHALE 1 PUFF  BY MOUTH TWICE DAILY  . gabapentin (NEURONTIN) 100 MG capsule Take 3 capsules (300 mg total) by mouth at bedtime.  Marland Kitchen ipratropium (ATROVENT) 0.03 % nasal spray Place 2 sprays in each nostril three times a day as needed prior to activities that cause nose to run  . levothyroxine (SYNTHROID) 75 MCG tablet SMARTSIG:1 Tablet(s) By Mouth  . verapamil (CALAN-SR) 120 MG CR tablet TAKE 1 TABLET(120 MG) BY MOUTH DAILY  . warfarin (COUMADIN) 5 MG tablet TAKE AS DIRECTED BY CLINIC   No facility-administered encounter medications on file as of 11/14/2020.    Allergies (verified) Sulfa antibiotics   History: Past Medical History:  Diagnosis Date  . Atrial fibrillation ([place])   . COPD (chronic obstructive pulmonary disease) ([place])   . Hypothyroidism   . Microscopic colitis   . Osteoporosis   . Squamous cell carcinoma   . Syncope    Past Surgical History:  Procedure Laterality Date  . APPENDECTOMY    . CATARACT EXTRACTION Bilateral   . COLONOSCOPY    . LIPOMA EXCISION    . MULTIPLE TOOTH EXTRACTIONS    . REFRACTIVE SURGERY    . SQUAMOUS CELL CARCINOMA EXCISION  07/10/2017   left shin   Family History  Problem Relation Age of Onset  . Alzheimer's disease Mother   . Brain cancer Father   . Cancer Brother 64       bladder   Social History   Socioeconomic History  . Marital status: Widowed    Spouse name: Not on file  . Number of children: Not on file  . Years of education: Not on file  . Highest education level: Not on file  Occupational History  .  Not on file  Tobacco Use  . Smoking status: Former Smoker    Packs/day: 1.00    Years: 40.00    Pack years: 40.00    Types: Cigarettes    Quit date: 2009    Years since quitting: 13.1  . Smokeless tobacco: Never Used  Substance and Sexual Activity  . Alcohol use: Not Currently    Comment: social  . Drug use: No  . Sexual activity: Never  Other Topics Concern  . Not on file  Social History Narrative   Recently moved to [facility] 03/2013 from [place].   Husband died in 10/25/11.      Son (adopted) lives in [place], [place].  Two grandchildren- 62, 42 yo.   DNR- forms signed and returned to patient.            Social Determinants of Health   Financial Resource Strain: Low Risk   . Difficulty of Paying Living Expenses: Not hard at all  Food Insecurity: No Food Insecurity  . Worried About Charity fundraiser in the Last Year: Never true  . Ran Out of Food in the Last Year: Never true  Transportation Needs: No Transportation Needs  . Lack of Transportation (Medical): No  . Lack of Transportation (Non-Medical): No  Physical Activity: Sufficiently Active  . Days of Exercise per Week: 5 days  . Minutes of Exercise per Session: 60 min  Stress: No Stress Concern Present  . Feeling of Stress : Not at all  Social Connections: Not on file    Tobacco Counseling Counseling given: Not Answered   Clinical Intake:  Pre-visit preparation completed: Yes  Pain : 0-10 Pain Type: Chronic pain Pain Location: Toe (Comment which one) Pain Orientation: Left,Right Pain Descriptors / Indicators: Aching Pain Onset: More than a month ago Pain Frequency: Intermittent     Nutritional Risks: None Diabetes: No  How often do you need to have someone help you when you read instructions, pamphlets, or other written materials from your doctor or pharmacy?: 1 - Never What is the last grade level you completed in school?: 2 years of college  Diabetic: No Nutrition Risk Assessment:  Has the patient had any N/V/D within the last 2 months?  No  Does the patient have any non-healing wounds?  No  Has the patient had any unintentional weight loss or weight gain?  No   Diabetes:  Is the patient diabetic?  No  If diabetic, was a CBG obtained today?  N/A Did the patient bring in their glucometer from home?  N/A How often do you monitor your CBG's? N/A.   Financial Strains and Diabetes Management:  Are you having any  financial strains with the device, your supplies or your medication? N/A.  Does the patient want to be seen by Chronic Care Management for management of their diabetes?  N/A Would the patient like to be referred to a Nutritionist or for Diabetic Management?  N/A  Interpreter Needed?: No  Information entered by :: CJohnson, LPN   Activities of Daily Living In your present state of health, do you have any difficulty performing the following activities: 11/14/2020  Hearing? Y  Comment some hearing loss noted  Vision? Y  Comment some vision issues  Difficulty concentrating or making decisions? Y  Comment memory loss noted  Walking or climbing stairs? N  Dressing or bathing? N  Doing errands, shopping? N  Preparing Food and eating ? N  Using the Toilet? N  In the  past six months, have you accidently leaked urine? Y  Comment wears pads  Do you have problems with loss of bowel control? N  Managing your Medications? N  Managing your Finances? N  Housekeeping or managing your Housekeeping? N  Some recent data might be hidden    Patient Care Team: Elby Beck, FNP (Inactive) as PCP - General (Nurse Practitioner) Oneta Rack, MD (Dermatology) Carloyn Manner, MD as Referring Physician (Otolaryngology) Estill Cotta, MD as Consulting Physician (Ophthalmology) Minna Merritts, MD as Consulting Physician (Cardiology) Josefine Class, MD as Referring Physician (Gastroenterology) Oneta Rack, MD (Dermatology)  Indicate any recent Medical Services you may have received from other than [facility] providers in the past year (date may be approximate).     Assessment:   This is a routine wellness examination for [name].  Hearing/Vision screen  Hearing Screening   125Hz  250Hz  500Hz  1000Hz  2000Hz  3000Hz  4000Hz  6000Hz  8000Hz   Right ear:           Left ear:           Vision Screening Comments: Patient gets annual eye exams   Dietary issues and exercise  activities discussed: Current Exercise Habits: Structured exercise class, Type of exercise: yoga;Other - see comments (water aerobics), Time (Minutes): 60, Frequency (Times/Week): 5, Weekly Exercise (Minutes/Week): 300, Intensity: Moderate, Exercise limited by: None identified  Goals    . Increase physical activity     Starting 10/27/2018, I will continue to exercise for at least 30 minutes 6 days per week.     . Patient Stated     11/02/2019, I will continue to exercise 7 days a week for 1 hour daily.     . Patient Stated     11/14/2020, I will continue yoga, water aerobics and cardio 5 days a week for 1 hour.       Depression Screen PHQ 2/9 Scores 11/14/2020 11/02/2019 10/27/2018 10/21/2017 10/15/2017 10/07/2016 10/03/2015  PHQ - 2 Score 0 2 0 0 0 0 0  PHQ- 9 Score 0 2 0 0 - - -    Fall Risk Fall Risk  11/14/2020 11/02/2019 10/27/2018 10/21/2017 10/15/2017  Falls in the past year? 0 0 0 No No  Number falls in past yr: 0 0 - - -  Injury with Fall? 0 0 - - -  Risk for fall due to : Impaired balance/gait Impaired balance/gait - - -  Follow up Falls evaluation completed;Falls prevention discussed Falls evaluation completed;Falls prevention discussed - - -    FALL RISK PREVENTION PERTAINING TO THE HOME:  Any stairs in or around the home? Yes  If so, are there any without handrails? No  Home free of loose throw rugs in walkways, pet beds, electrical cords, etc? Yes  Adequate lighting in your home to reduce risk of falls? Yes   ASSISTIVE DEVICES UTILIZED TO PREVENT FALLS:  Life alert? No  Use of a cane, walker or w/c? Yes  Grab bars in the bathroom? No  Shower chair or bench in shower? Yes  Elevated toilet seat or a handicapped toilet? Yes   TIMED UP AND GO:  Was the test performed? N/A telephone visit.   Cognitive Function: MMSE - Mini Mental State Exam 11/14/2020 11/02/2019 10/27/2018 10/21/2017 10/07/2016  Orientation to time 5 5 5 5 5   Orientation to Place 5 5 5 5 5   Registration 3 3 3 3 3    Attention/ Calculation 5 5 0 0 0  Recall 3 3 3  3  3  Language- name 2 objects - - 0 0 0  Language- repeat 1 1 1 1 1   Language- follow 3 step command - - 3 3 3   Language- read & follow direction - - 0 0 0  Write a sentence - - 0 0 0  Copy design - - 0 0 0  Total score - - 20 20 20   Mini Cog  Mini-Cog screen was completed. Maximum score is 22. A value of 0 denotes this part of the MMSE was not completed or the patient failed this part of the Mini-Cog screening.       Immunizations Immunization History  Administered Date(s) Administered  . DTaP 04/30/2004  . Influenza, High Dose Seasonal PF 06/17/2014  . Influenza,inj,Quad PF,6+ Mos 06/22/2013  . Influenza-Unspecified 06/26/2015, 06/25/2017, 06/24/2019, 06/16/2020  . Moderna Sars-Covid-2 Vaccination 10/07/2019, 10/28/2019, 07/28/2020  . Pneumococcal Conjugate-13 09/29/2014  . Pneumococcal Polysaccharide-23 06/01/2012  . Varicella 08/06/2007    TDAP status: Due, Education has been provided regarding the importance of this vaccine. Advised may receive this vaccine at local pharmacy or Health Dept. Aware to provide a copy of the vaccination record if obtained from local pharmacy or Health Dept. Verbalized acceptance and understanding.  Flu Vaccine status: Up to date  Pneumococcal vaccine status: Up to date  Covid-19 vaccine status: Completed vaccines  Qualifies for Shingles Vaccine? Yes   Zostavax completed No   Shingrix Completed?: No.    Education has been provided regarding the importance of this vaccine. Patient has been advised to call insurance company to determine out of pocket expense if they have not yet received this vaccine. Advised may also receive vaccine at local pharmacy or Health Dept. Verbalized acceptance and understanding.  Screening Tests Health Maintenance  Topic Date Due  . TETANUS/TDAP  04/29/2024 (Originally 04/30/2014)  . INFLUENZA VACCINE  Completed  . DEXA SCAN  Completed  . COVID-19 Vaccine   Completed  . PNA vac Low Risk Adult  Completed  . HPV VACCINES  Aged Out    Health Maintenance  There are no preventive care reminders to display for this patient.  Colorectal cancer screening: No longer required.   Mammogram status: No longer required due to age.  Bone Density status: declined  Lung Cancer Screening: (Low Dose CT Chest recommended if Age 59-80 years, 30 pack-year currently smoking OR have quit w/in 15years.) does not qualify.    Additional Screening:  Hepatitis C Screening: does not qualify; Completed N/A  Vision Screening: Recommended annual ophthalmology exams for early detection of glaucoma and other disorders of the eye. Is the patient up to date with their annual eye exam?  Yes  Who is the provider or what is the name of the office in which the patient attends annual eye exams? Dr. Holley Bouche If pt is not established with a provider, would they like to be referred to a provider to establish care? No .   Dental Screening: Recommended annual dental exams for proper oral hygiene  Community Resource Referral / Chronic Care Management: CRR required this visit?  No   CCM required this visit?  No      Plan:     I have personally reviewed and noted the following in the patient's chart:   . Medical and social history . Use of alcohol, tobacco or illicit drugs  . Current medications and supplements . Functional ability and status . Nutritional status . Physical activity . Advanced directives . List of other physicians . Hospitalizations, surgeries, and  ER visits in previous 12 months . Vitals . Screenings to include cognitive, depression, and falls . Referrals and appointments  In addition, I have reviewed and discussed with patient certain preventive protocols, quality metrics, and best practice recommendations. A written personalized care plan for preventive services as well as general preventive health recommendations were provided to patient.    Due to this being a telephonic visit, the after visit summary with patients personalized plan was offered to patient via office or my-chart. Patient preferred to pick up at office at next visit or via mychart.   Andrez Grime, LPN   6/0/1561

## 2020-11-14 NOTE — Progress Notes (Signed)
PCP notes:  Health Maintenance: Dexa- declined   Abnormal Screenings: none   Patient concerns: Refill gabapentin  Renew MOST form    Nurse concerns: none   Next PCP appt.: 11/16/2020 @ 2:40 pm

## 2020-11-14 NOTE — Patient Instructions (Signed)
[name] , Thank you for taking time to come for your Medicare Wellness Visit. I appreciate your ongoing commitment to your health goals. Please review the following plan we discussed and let me know if I can assist you in the future.   Screening recommendations/referrals: Colonoscopy: no longer required  Mammogram: no longer required  Bone Density: declined Recommended yearly ophthalmology/optometry visit for glaucoma screening and checkup Recommended yearly dental visit for hygiene and checkup  Vaccinations: Influenza vaccine: Up to date, completed 06/16/2020, due 04/2021 Pneumococcal vaccine: Completed series Tdap vaccine: decline-insurance  Shingles vaccine: due, check with your insurance regarding coverage if interested    Covid-19:Completed series  Advanced directives: copy in chart  Conditions/risks identified: hyperlipidemia   Next appointment: Follow up in one year for your annual wellness visit    Preventive Care 65 Years and Older, Female Preventive care refers to lifestyle choices and visits with your health care provider that can promote health and wellness. What does preventive care include?  A yearly physical exam. This is also called an annual well check.  Dental exams once or twice a year.  Routine eye exams. Ask your health care provider how often you should have your eyes checked.  Personal lifestyle choices, including:  Daily care of your teeth and gums.  Regular physical activity.  Eating a healthy diet.  Avoiding tobacco and drug use.  Limiting alcohol use.  Practicing safe sex.  Taking low-dose aspirin every day.  Taking vitamin and mineral supplements as recommended by your health care provider. What happens during an annual well check? The services and screenings done by your health care provider during your annual well check will depend on your age, overall health, lifestyle risk factors, and family history of disease. Counseling  Your  health care provider may ask you questions about your:  Alcohol use.  Tobacco use.  Drug use.  Emotional well-being.  Home and relationship well-being.  Sexual activity.  Eating habits.  History of falls.  Memory and ability to understand (cognition).  Work and work Statistician.  Reproductive health. Screening  You may have the following tests or measurements:  Height, weight, and BMI.  Blood pressure.  Lipid and cholesterol levels. These may be checked every 5 years, or more frequently if you are over 79 years old.  Skin check.  Lung cancer screening. You may have this screening every year starting at age 29 if you have a 30-pack-year history of smoking and currently smoke or have quit within the past 15 years.  Fecal occult blood test (FOBT) of the stool. You may have this test every year starting at age 65.  Flexible sigmoidoscopy or colonoscopy. You may have a sigmoidoscopy every 5 years or a colonoscopy every 10 years starting at age 28.  Hepatitis C blood test.  Hepatitis B blood test.  Sexually transmitted disease (STD) testing.  Diabetes screening. This is done by checking your blood sugar (glucose) after you have not eaten for a while (fasting). You may have this done every 1-3 years.  Bone density scan. This is done to screen for osteoporosis. You may have this done starting at age 57.  Mammogram. This may be done every 1-2 years. Talk to your health care provider about how often you should have regular mammograms. Talk with your health care provider about your test results, treatment options, and if necessary, the need for more tests. Vaccines  Your health care provider may recommend certain vaccines, such as:  Influenza vaccine. This is  recommended every year.  Tetanus, diphtheria, and acellular pertussis (Tdap, Td) vaccine. You may need a Td booster every 10 years.  Zoster vaccine. You may need this after age 55.  Pneumococcal 13-valent  conjugate (PCV13) vaccine. One dose is recommended after age 58.  Pneumococcal polysaccharide (PPSV23) vaccine. One dose is recommended after age 59. Talk to your health care provider about which screenings and vaccines you need and how often you need them. This information is not intended to replace advice given to you by your health care provider. Make sure you discuss any questions you have with your health care provider. Document Released: 09/29/2015 Document Revised: 05/22/2016 Document Reviewed: 07/04/2015 Elsevier Interactive Patient Education  2017 [place] Prevention in the Home Falls can cause injuries. They can happen to people of all ages. There are many things you can do to make your home safe and to help prevent falls. What can I do on the outside of my home?  Regularly fix the edges of walkways and driveways and fix any cracks.  Remove anything that might make you trip as you walk through a door, such as a raised step or threshold.  Trim any bushes or trees on the path to your home.  Use bright outdoor lighting.  Clear any walking paths of anything that might make someone trip, such as rocks or tools.  Regularly check to see if handrails are loose or broken. Make sure that both sides of any steps have handrails.  Any raised decks and porches should have guardrails on the edges.  Have any leaves, snow, or ice cleared regularly.  Use sand or salt on walking paths during winter.  Clean up any spills in your garage right away. This includes oil or grease spills. What can I do in the bathroom?  Use night lights.  Install grab bars by the toilet and in the tub and shower. Do not use towel bars as grab bars.  Use non-skid mats or decals in the tub or shower.  If you need to sit down in the shower, use a plastic, non-slip stool.  Keep the floor dry. Clean up any water that spills on the floor as soon as it happens.  Remove soap buildup in the tub or  shower regularly.  Attach bath mats securely with double-sided non-slip rug tape.  Do not have throw rugs and other things on the floor that can make you trip. What can I do in the bedroom?  Use night lights.  Make sure that you have a light by your bed that is easy to reach.  Do not use any sheets or blankets that are too big for your bed. They should not hang down onto the floor.  Have a firm chair that has side arms. You can use this for support while you get dressed.  Do not have throw rugs and other things on the floor that can make you trip. What can I do in the kitchen?  Clean up any spills right away.  Avoid walking on wet floors.  Keep items that you use a lot in easy-to-reach places.  If you need to reach something above you, use a strong step stool that has a grab bar.  Keep electrical cords out of the way.  Do not use floor polish or wax that makes floors slippery. If you must use wax, use non-skid floor wax.  Do not have throw rugs and other things on the floor that can make you trip.  What can I do with my stairs?  Do not leave any items on the stairs.  Make sure that there are handrails on both sides of the stairs and use them. Fix handrails that are broken or loose. Make sure that handrails are as long as the stairways.  Check any carpeting to make sure that it is firmly attached to the stairs. Fix any carpet that is loose or worn.  Avoid having throw rugs at the top or bottom of the stairs. If you do have throw rugs, attach them to the floor with carpet tape.  Make sure that you have a light switch at the top of the stairs and the bottom of the stairs. If you do not have them, ask someone to add them for you. What else can I do to help prevent falls?  Wear shoes that:  Do not have high heels.  Have rubber bottoms.  Are comfortable and fit you well.  Are closed at the toe. Do not wear sandals.  If you use a stepladder:  Make sure that it is fully  opened. Do not climb a closed stepladder.  Make sure that both sides of the stepladder are locked into place.  Ask someone to hold it for you, if possible.  Clearly mark and make sure that you can see:  Any grab bars or handrails.  First and last steps.  Where the edge of each step is.  Use tools that help you move around (mobility aids) if they are needed. These include:  Canes.  Walkers.  Scooters.  Crutches.  Turn on the lights when you go into a dark area. Replace any light bulbs as soon as they burn out.  Set up your furniture so you have a clear path. Avoid moving your furniture around.  If any of your floors are uneven, fix them.  If there are any pets around you, be aware of where they are.  Review your medicines with your doctor. Some medicines can make you feel dizzy. This can increase your chance of falling. Ask your doctor what other things that you can do to help prevent falls. This information is not intended to replace advice given to you by your health care provider. Make sure you discuss any questions you have with your health care provider. Document Released: 06/29/2009 Document Revised: 02/08/2016 Document Reviewed: 10/07/2014 Elsevier Interactive Patient Education  2017 [employer].

## 2020-11-16 ENCOUNTER — Encounter: Payer: Self-pay | Admitting: Family Medicine

## 2020-11-16 ENCOUNTER — Ambulatory Visit (INDEPENDENT_AMBULATORY_CARE_PROVIDER_SITE_OTHER): Payer: PPO | Admitting: Family Medicine

## 2020-11-16 ENCOUNTER — Other Ambulatory Visit: Payer: Self-pay

## 2020-11-16 DIAGNOSIS — E039 Hypothyroidism, unspecified: Secondary | ICD-10-CM | POA: Diagnosis not present

## 2020-11-16 DIAGNOSIS — M8580 Other specified disorders of bone density and structure, unspecified site: Secondary | ICD-10-CM | POA: Diagnosis not present

## 2020-11-16 DIAGNOSIS — J300 Vasomotor rhinitis: Secondary | ICD-10-CM | POA: Diagnosis not present

## 2020-11-16 DIAGNOSIS — E782 Mixed hyperlipidemia: Secondary | ICD-10-CM | POA: Diagnosis not present

## 2020-11-16 DIAGNOSIS — J449 Chronic obstructive pulmonary disease, unspecified: Secondary | ICD-10-CM

## 2020-11-16 DIAGNOSIS — I4891 Unspecified atrial fibrillation: Secondary | ICD-10-CM

## 2020-11-16 DIAGNOSIS — Z66 Do not resuscitate: Secondary | ICD-10-CM

## 2020-11-16 DIAGNOSIS — G629 Polyneuropathy, unspecified: Secondary | ICD-10-CM | POA: Diagnosis not present

## 2020-11-16 MED ORDER — GABAPENTIN 100 MG PO CAPS: 300.0000 mg | ORAL_CAPSULE | Freq: Every day | ORAL | 1 refills | Status: DC

## 2020-11-16 NOTE — Assessment & Plan Note (Signed)
Stable control on levothyroxine 75 mcg daily

## 2020-11-16 NOTE — Patient Instructions (Signed)
Keep up the healthy eating and regular exercise.

## 2020-11-16 NOTE — Assessment & Plan Note (Addendum)
Rate controlled and on anticoagulation coumadin.  Followed by Dr. Rockey Situ.
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
## 2020-11-16 NOTE — Assessment & Plan Note (Signed)
ipratropium nasal spray helps.

## 2020-11-16 NOTE — Assessment & Plan Note (Signed)
Reviewed and renewed MOST form.

## 2020-11-16 NOTE — Assessment & Plan Note (Signed)
Inadequate control of pain, gabapentin helps some.  Higher doses were not helpful, not interested in additional medication today.  Will re-consider if pain becoming unbearable.  ie cymbalta, amitryptiline, change to lyrica etc.

## 2020-11-16 NOTE — Assessment & Plan Note (Signed)
Stable control on Adviar.  Followed by Dr. Mortimer Fries.

## 2020-11-16 NOTE — Assessment & Plan Note (Signed)
Stable control not on cholesterol medication.

## 2020-11-18 ENCOUNTER — Other Ambulatory Visit: Payer: Self-pay | Admitting: Cardiovascular Disease

## 2020-12-04 DIAGNOSIS — D802 Selective deficiency of immunoglobulin A [IgA]: Secondary | ICD-10-CM | POA: Diagnosis not present

## 2020-12-04 DIAGNOSIS — I482 Chronic atrial fibrillation, unspecified: Secondary | ICD-10-CM | POA: Diagnosis not present

## 2020-12-04 LAB — POCT INR: INR: 1.6 — AB (ref 2.0–3.0)

## 2020-12-05 ENCOUNTER — Ambulatory Visit (INDEPENDENT_AMBULATORY_CARE_PROVIDER_SITE_OTHER): Payer: PPO

## 2020-12-05 DIAGNOSIS — Z7901 Long term (current) use of anticoagulants: Secondary | ICD-10-CM | POA: Diagnosis not present

## 2020-12-05 NOTE — Patient Instructions (Addendum)
Pre visit review using our clinic review tool, if applicable. No additional management support is needed unless otherwise documented below in the visit note.  Advised to increase dose today to 7.5 mg and increase dose tomorrow to 5mg  then continue taking 5mg  daily except take 2.5mg  on Wednesdays and Sundays and recheck in 4 wks. Advised pt of above instructions. Pt verbalized understanding. Pt does not want AVS mailed.

## 2020-12-07 ENCOUNTER — Encounter: Payer: Self-pay | Admitting: Podiatry

## 2020-12-07 ENCOUNTER — Other Ambulatory Visit: Payer: Self-pay

## 2020-12-07 ENCOUNTER — Ambulatory Visit: Payer: PPO | Admitting: Podiatry

## 2020-12-07 DIAGNOSIS — D689 Coagulation defect, unspecified: Secondary | ICD-10-CM | POA: Diagnosis not present

## 2020-12-07 DIAGNOSIS — M79674 Pain in right toe(s): Secondary | ICD-10-CM

## 2020-12-07 DIAGNOSIS — G629 Polyneuropathy, unspecified: Secondary | ICD-10-CM

## 2020-12-07 DIAGNOSIS — M79675 Pain in left toe(s): Secondary | ICD-10-CM

## 2020-12-07 DIAGNOSIS — L84 Corns and callosities: Secondary | ICD-10-CM

## 2020-12-07 DIAGNOSIS — B351 Tinea unguium: Secondary | ICD-10-CM | POA: Diagnosis not present

## 2020-12-07 NOTE — Progress Notes (Signed)
This patient returns to my office for at risk foot care.  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  Patient is taking coumadin.  This patient is unable to cut nails herself since the patient cannot reach her nails.These nails are painful walking and wearing shoes.  This patient presents for at risk foot care today.  General Appearance  Alert, conversant and in no acute stress.  Vascular  Dorsalis pedis and posterior tibial  pulses are not  palpable  bilaterally.  Capillary return is within normal limits  bilaterally. Cold feet.  Absent hair.  Venous stasis dermatitis  Legs  B/L. bilaterally.  Neurologic  Senn-Weinstein monofilament wire test within normal limits  bilaterally. Muscle power within normal limits bilaterally.  Nails Thick disfigured discolored nails with subungual debris  from hallux to fifth toes bilaterally. No evidence of bacterial infection or drainage bilaterally.  Orthopedic  No limitations of motion  feet .  No crepitus or effusions noted.  Hammer toes 2-4  B/L.  Skin  normotropic skin with no porokeratosis noted bilaterally.  No signs of infections or ulcers noted.     Onychomycosis  Pain in right toes  Pain in left toes  Consent was obtained for treatment procedures.   Mechanical debridement of nails 1-5  bilaterally performed with a nail nipper.  Filed with dremel without incident.   Debride clavi with # 15 blade.   Return office visit   3 months                  Told patient to return for periodic foot care and evaluation due to potential at risk complications.   Gardiner Barefoot DPM

## 2020-12-15 ENCOUNTER — Other Ambulatory Visit: Payer: Self-pay

## 2020-12-15 ENCOUNTER — Ambulatory Visit (INDEPENDENT_AMBULATORY_CARE_PROVIDER_SITE_OTHER): Payer: PPO | Admitting: Family Medicine

## 2020-12-15 ENCOUNTER — Encounter: Payer: Self-pay | Admitting: Family Medicine

## 2020-12-15 VITALS — BP 120/90 | HR 89 | Temp 97.5°F | Ht 68.0 in | Wt 132.5 lb

## 2020-12-15 DIAGNOSIS — R296 Repeated falls: Secondary | ICD-10-CM | POA: Diagnosis not present

## 2020-12-15 DIAGNOSIS — G8929 Other chronic pain: Secondary | ICD-10-CM | POA: Diagnosis not present

## 2020-12-15 DIAGNOSIS — G629 Polyneuropathy, unspecified: Secondary | ICD-10-CM | POA: Diagnosis not present

## 2020-12-15 DIAGNOSIS — M2550 Pain in unspecified joint: Secondary | ICD-10-CM

## 2020-12-15 NOTE — Patient Instructions (Signed)
We will set you up for referral Physical therapy for balance retraining. Use 4 prong cane. Can try shoes for  neuropathy and balance.  Start over the counter topical diclofenac (voltaren) cream on  Elbow and right knee.Marland Kitchen 4 times daily.  Start thoracic back exercises.

## 2020-12-15 NOTE — Progress Notes (Signed)
Patient ID: [name], female    DOB: August 20, 1932, 87 y.o.   MRN: [id number]  This visit was conducted in person.  BP 120/90   Pulse 89   Temp (!) 97.5 F (36.4 C) (Temporal)   Ht 5\' 8"  (1.727 m)   Wt 132 lb 8 oz (60.1 kg)   SpO2 96%   BMI 20.15 kg/m    CC:  Chief Complaint  Patient presents with  . Fall    X 2  . Elbow Pain    Bilateral   . Knee Pain    Right  . Back Pain    Subjective:   HPI: [name] is a 87 y.o. female presenting on 12/15/2020 for Fall (X 2), Elbow Pain (Bilateral/), Knee Pain (Right), and Back Pain   She fell again on 12/07/2020.Marland Kitchenlost balance and fell backward onto back side  Occurred after bus ride.    No proceeding CP, no SOB,  No vertigo or dizziness. No LOC. No head injury.  Now in past few days she is achy all over... stiffness all over.  Neuropathy acting up in feet.  Sore in left  [place].  Using arnicare cream for pain.   On gabapentin for neuropathy.  Had abrasion on right ankle ( now resolved), right knee is now sore.  Relevant past medical, surgical, family and social history reviewed and updated as indicated. Interim medical history since our last visit reviewed. Allergies and medications reviewed and updated. Outpatient Medications Prior to Visit  Medication Sig Dispense Refill  . Fluticasone-Salmeterol (ADVAIR) 100-50 MCG/DOSE AEPB INHALE 1 PUFF BY MOUTH TWICE DAILY 180 each 2  . gabapentin (NEURONTIN) 100 MG capsule Take 3 capsules (300 mg total) by mouth at bedtime. 270 capsule 1  . ipratropium (ATROVENT) 0.03 % nasal spray Place 2 sprays in each nostril three times a day as needed prior to activities that cause nose to run 30 mL 5  . levothyroxine (SYNTHROID) 75 MCG tablet Take 75 mcg by mouth daily before breakfast.    . verapamil (CALAN-SR) 120 MG CR tablet TAKE 1 TABLET(120 MG) BY MOUTH DAILY 90 tablet 1  . warfarin (COUMADIN) 5 MG tablet TAKE AS DIRECTED BY CLINIC 90 tablet 1   No facility-administered  medications prior to visit.     Per HPI unless specifically indicated in ROS section below Review of Systems  Constitutional: Negative for fatigue and fever.  HENT: Negative for congestion.   Eyes: Negative for pain.  Respiratory: Negative for cough and shortness of breath.   Cardiovascular: Negative for chest pain, palpitations and leg swelling.  Gastrointestinal: Negative for abdominal pain.  Genitourinary: Negative for dysuria and vaginal bleeding.  Musculoskeletal: Negative for back pain.  Neurological: Negative for syncope, light-headedness and headaches.  Psychiatric/Behavioral: Negative for dysphoric mood.   Objective:  BP 120/90   Pulse 89   Temp (!) 97.5 F (36.4 C) (Temporal)   Ht 5\' 8"  (1.727 m)   Wt 132 lb 8 oz (60.1 kg)   SpO2 96%   BMI 20.15 kg/m   Wt Readings from Last 3 Encounters:  12/15/20 132 lb 8 oz (60.1 kg)  11/16/20 132 lb (59.9 kg)  06/14/20 131 lb 6.4 oz (59.6 kg)      Physical Exam Constitutional:      General: She is not in acute distress.    Appearance: Normal appearance. She is well-developed. She is not ill-appearing or toxic-appearing.  HENT:     Head: Normocephalic.  Right Ear: Hearing, tympanic membrane, ear canal and external ear normal. Tympanic membrane is not erythematous, retracted or bulging.     Left Ear: Hearing, tympanic membrane, ear canal and external ear normal. Tympanic membrane is not erythematous, retracted or bulging.     Nose: No mucosal edema or rhinorrhea.     Right Sinus: No maxillary sinus tenderness or frontal sinus tenderness.     Left Sinus: No maxillary sinus tenderness or frontal sinus tenderness.     Mouth/Throat:     Pharynx: Uvula midline.  Eyes:     General: Lids are normal. Lids are everted, no foreign bodies appreciated.     Conjunctiva/sclera: Conjunctivae normal.     Pupils: Pupils are equal, round, and reactive to light.  Neck:     Thyroid: No thyroid mass or thyromegaly.     Vascular: No carotid  bruit.     Trachea: Trachea normal.  Cardiovascular:     Rate and Rhythm: Normal rate and regular rhythm.     Pulses: Normal pulses.     Heart sounds: Normal heart sounds, S1 normal and S2 normal. No murmur heard. No friction rub. No gallop.   Pulmonary:     Effort: Pulmonary effort is normal. No tachypnea or respiratory distress.     Breath sounds: Normal breath sounds. No decreased breath sounds, wheezing, rhonchi or rales.  Abdominal:     General: Bowel sounds are normal.     Palpations: Abdomen is soft.     Tenderness: There is no abdominal tenderness.  Musculoskeletal:     Cervical back: Normal range of motion and neck supple.  Skin:    General: Skin is warm and dry.     Findings: No rash.  Neurological:     Mental Status: She is alert.  Psychiatric:        Mood and Affect: Mood is not anxious or depressed.        Speech: Speech normal.        Behavior: Behavior normal. Behavior is cooperative.        Thought Content: Thought content normal.        Judgment: Judgment normal.       Results for orders placed or performed in visit on 12/05/20  POCT INR  Result Value Ref Range   INR 1.6 (A) 2.0 - 3.0    This visit occurred during the SARS-CoV-2 public health emergency.  Safety protocols were in place, including screening questions prior to the visit, additional usage of staff PPE, and extensive cleaning of exam room while observing appropriate contact time as indicated for disinfecting solutions.   COVID 19 screen:  No recent travel or known exposure to COVID19 The patient denies respiratory symptoms of COVID 19 at this time. The importance of social distancing was discussed today.   Assessment and Plan Problem List Items Addressed This Visit    Chronic pain of multiple joints    Start over the counter topical diclofenac (voltaren) cream on  Elbow and right knee.Marland Kitchen 4 times daily.       Falls frequently    Reviewed home safety and use of assistive device.       Polyneuropathy - Primary    We will set you up for referral Physical therapy for balance retraining. Use 4 prong cane. Can try shoes for  neuropathy and balance.  Contributing to frequent falls.           [name], MD

## 2020-12-16 ENCOUNTER — Other Ambulatory Visit: Payer: Self-pay | Admitting: Family Medicine

## 2020-12-18 DIAGNOSIS — D802 Selective deficiency of immunoglobulin A [IgA]: Secondary | ICD-10-CM | POA: Diagnosis not present

## 2020-12-18 LAB — PROTIME-INR: INR: 2 — AB (ref 0.9–1.1)

## 2020-12-18 LAB — POCT INR: INR: 2 (ref 2.0–3.0)

## 2020-12-20 ENCOUNTER — Ambulatory Visit (INDEPENDENT_AMBULATORY_CARE_PROVIDER_SITE_OTHER): Payer: PPO

## 2020-12-20 DIAGNOSIS — Z7901 Long term (current) use of anticoagulants: Secondary | ICD-10-CM

## 2020-12-20 NOTE — Patient Instructions (Addendum)
Pre visit review using our clinic review tool, if applicable. No additional management support is needed unless otherwise documented below in the visit note.  Continue to take 5 mg everyday EXCEPT take 2.5 mg on Sundays and Wednesdays. Re-Check in 4 weeks.

## 2021-01-01 DIAGNOSIS — G629 Polyneuropathy, unspecified: Secondary | ICD-10-CM | POA: Diagnosis not present

## 2021-01-01 DIAGNOSIS — R296 Repeated falls: Secondary | ICD-10-CM | POA: Diagnosis not present

## 2021-01-01 DIAGNOSIS — R2681 Unsteadiness on feet: Secondary | ICD-10-CM | POA: Diagnosis not present

## 2021-01-01 DIAGNOSIS — R278 Other lack of coordination: Secondary | ICD-10-CM | POA: Diagnosis not present

## 2021-01-15 ENCOUNTER — Ambulatory Visit (INDEPENDENT_AMBULATORY_CARE_PROVIDER_SITE_OTHER): Payer: PPO

## 2021-01-15 DIAGNOSIS — I4891 Unspecified atrial fibrillation: Secondary | ICD-10-CM | POA: Diagnosis not present

## 2021-01-15 DIAGNOSIS — I482 Chronic atrial fibrillation, unspecified: Secondary | ICD-10-CM | POA: Diagnosis not present

## 2021-01-15 DIAGNOSIS — Z7901 Long term (current) use of anticoagulants: Secondary | ICD-10-CM | POA: Diagnosis not present

## 2021-01-15 LAB — POCT INR: INR: 1.5 — AB (ref 2.0–3.0)

## 2021-01-16 ENCOUNTER — Encounter: Payer: Self-pay | Admitting: Family Medicine

## 2021-01-16 ENCOUNTER — Other Ambulatory Visit: Payer: Self-pay

## 2021-01-16 ENCOUNTER — Ambulatory Visit (INDEPENDENT_AMBULATORY_CARE_PROVIDER_SITE_OTHER): Payer: PPO | Admitting: Family Medicine

## 2021-01-16 VITALS — BP 104/80 | HR 94 | Temp 98.3°F | Ht 68.0 in | Wt 132.5 lb

## 2021-01-16 DIAGNOSIS — N3941 Urge incontinence: Secondary | ICD-10-CM

## 2021-01-16 DIAGNOSIS — R278 Other lack of coordination: Secondary | ICD-10-CM | POA: Diagnosis not present

## 2021-01-16 DIAGNOSIS — R2681 Unsteadiness on feet: Secondary | ICD-10-CM | POA: Diagnosis not present

## 2021-01-16 DIAGNOSIS — G629 Polyneuropathy, unspecified: Secondary | ICD-10-CM

## 2021-01-16 DIAGNOSIS — R296 Repeated falls: Secondary | ICD-10-CM | POA: Diagnosis not present

## 2021-01-16 NOTE — Assessment & Plan Note (Signed)
No S/S of infection. Discussed med treatment options including Myrbetriq. She is not interested in med to treat. Bladder spasm trigger avoidance.. try decrease caffeine and citrus.  Discussed correct type of adult underware/pull ups to get for emergencies

## 2021-01-16 NOTE — Patient Instructions (Signed)
Take an extra 1/2 (7.5mg ) today  (5/2) and tomorrow (7.5mg ) (5/3) then return to prior dosing.  5 mg everyday EXCEPT take 2.5 mg on Sundays and Wednesdays. Re-Check in 2 weeks.   Spoke with patient and made aware.  She reports eating excess strawberries in recent days and may have missed 1 dose.  She is aware of risks associated with being subtherapeutic and will go to ER if any concerns.

## 2021-01-16 NOTE — Assessment & Plan Note (Signed)
Today she reports she has no body pain. Her neuropathy is well controlled. She is doing well with balance retraining overall.

## 2021-01-16 NOTE — Patient Instructions (Signed)
Keep up with the physical therapy.  Avoid bladder triggers.  Look into incontinence pads.

## 2021-01-16 NOTE — Progress Notes (Signed)
Patient ID: [name], female    DOB: 06-18-1932, 87 y.o.   MRN: [id number]  This visit was conducted in person.  BP 104/80   Pulse 94   Temp 98.3 F (36.8 C) (Temporal)   Ht 5\' 8"  (1.727 m)   Wt 132 lb 8 oz (60.1 kg)   SpO2 97%   BMI 20.15 kg/m    CC:  Chief Complaint  Patient presents with  . Follow-up    Neuropathy & Incontinence    Subjective:   HPI: [name] is a 87 y.o. female presenting on 01/16/2021 for Follow-up (Neuropathy & Incontinence)   She has history of polyneuropathy. Had several falls recently.  At last OV 12/15/2020 she was referred for PT for balance retraining. Encouraged 4 prong cane and given rx for shoes for balance.  She is on gabapentin 300 mg at bedtime for pain.   Today she reports she has no pain.  She has more energy. She has improvement overall in mood.  She is feeling better overall.   She is doing thoracic back exercises.   She is doing well with balance retraining 2 times a week.  She reports she has issues with  Urinary urgency.  She  Is fearful she cannot make it to [facility] in time.Marland Kitchen occ  Leakage of urine. One urination a night.  No dysuria.Marland Kitchen ongoing for  3 years.          Relevant past medical, surgical, family and social history reviewed and updated as indicated. Interim medical history since our last visit reviewed. Allergies and medications reviewed and updated. Outpatient Medications Prior to Visit  Medication Sig Dispense Refill  . Fluticasone-Salmeterol (ADVAIR) 100-50 MCG/DOSE AEPB INHALE 1 PUFF BY MOUTH TWICE DAILY 180 each 2  . gabapentin (NEURONTIN) 100 MG capsule Take 3 capsules (300 mg total) by mouth at bedtime. 270 capsule 1  . ipratropium (ATROVENT) 0.03 % nasal spray Place 2 sprays in each nostril three times a day as needed prior to activities that cause nose to run 30 mL 5  . levothyroxine (SYNTHROID) 75 MCG tablet TAKE 1 TABLET BY MOUTH 30 MINUTES BEFORE EATING WITH A FULL GLASS OF WATER 90 tablet  3  . verapamil (CALAN-SR) 120 MG CR tablet TAKE 1 TABLET(120 MG) BY MOUTH DAILY 90 tablet 1  . warfarin (COUMADIN) 5 MG tablet TAKE AS DIRECTED BY CLINIC 90 tablet 1   No facility-administered medications prior to visit.     Per HPI unless specifically indicated in ROS section below Review of Systems  Constitutional: Negative for fatigue and fever.  HENT: Negative for ear pain.   Eyes: Negative for pain.  Respiratory: Negative for chest tightness and shortness of breath.   Cardiovascular: Negative for chest pain, palpitations and leg swelling.  Gastrointestinal: Negative for abdominal pain.  Genitourinary: Negative for dysuria.   Objective:  BP 104/80   Pulse 94   Temp 98.3 F (36.8 C) (Temporal)   Ht 5\' 8"  (1.727 m)   Wt 132 lb 8 oz (60.1 kg)   SpO2 97%   BMI 20.15 kg/m   Wt Readings from Last 3 Encounters:  01/16/21 132 lb 8 oz (60.1 kg)  12/15/20 132 lb 8 oz (60.1 kg)  11/16/20 132 lb (59.9 kg)      Physical Exam Constitutional:      General: She is not in acute distress.    Appearance: Normal appearance. She is well-developed. She is not ill-appearing or toxic-appearing.  Comments: elderly  HENT:     Head: Normocephalic.     Right Ear: Hearing, tympanic membrane, ear canal and external ear normal. Tympanic membrane is not erythematous, retracted or bulging.     Left Ear: Hearing, tympanic membrane, ear canal and external ear normal. Tympanic membrane is not erythematous, retracted or bulging.     Nose: No mucosal edema or rhinorrhea.     Right Sinus: No maxillary sinus tenderness or frontal sinus tenderness.     Left Sinus: No maxillary sinus tenderness or frontal sinus tenderness.     Mouth/Throat:     Pharynx: Uvula midline.  Eyes:     General: Lids are normal. Lids are everted, no foreign bodies appreciated.     Conjunctiva/sclera: Conjunctivae normal.     Pupils: Pupils are equal, round, and reactive to light.  Neck:     Thyroid: No thyroid mass or  thyromegaly.     Vascular: No carotid bruit.     Trachea: Trachea normal.  Cardiovascular:     Rate and Rhythm: Normal rate and regular rhythm.     Pulses: Normal pulses.     Heart sounds: Normal heart sounds, S1 normal and S2 normal. No murmur heard. No friction rub. No gallop.   Pulmonary:     Effort: Pulmonary effort is normal. No tachypnea or respiratory distress.     Breath sounds: Normal breath sounds. No decreased breath sounds, wheezing, rhonchi or rales.  Abdominal:     General: Bowel sounds are normal.     Palpations: Abdomen is soft.     Tenderness: There is no abdominal tenderness.  Musculoskeletal:     Cervical back: Normal range of motion and neck supple.  Skin:    General: Skin is warm and dry.     Findings: No rash.  Neurological:     Mental Status: She is alert.  Psychiatric:        Mood and Affect: Mood is not anxious or depressed.        Speech: Speech normal.        Behavior: Behavior normal. Behavior is cooperative.        Thought Content: Thought content normal.        Judgment: Judgment normal.       Results for orders placed or performed in visit on 01/15/21  POCT INR  Result Value Ref Range   INR 1.5 (A) 2.0 - 3.0    This visit occurred during the SARS-CoV-2 public health emergency.  Safety protocols were in place, including screening questions prior to the visit, additional usage of staff PPE, and extensive cleaning of exam room while observing appropriate contact time as indicated for disinfecting solutions.   COVID 19 screen:  No recent travel or known exposure to COVID19 The patient denies respiratory symptoms of COVID 19 at this time. The importance of social distancing was discussed today.   Assessment and Plan    Problem List Items Addressed This Visit    Polyneuropathy - Primary     Today she reports she has no body pain. Her neuropathy is well controlled. She is doing well with balance retraining overall.      Urge incontinence of  urine    No S/S of infection. Discussed med treatment options including Myrbetriq. She is not interested in med to treat. Bladder spasm trigger avoidance.. try decrease caffeine and citrus.  Discussed correct type of adult underware/pull ups to get for emergencies  Kiely Cousar, MD   

## 2021-01-24 DIAGNOSIS — R296 Repeated falls: Secondary | ICD-10-CM | POA: Insufficient documentation

## 2021-01-24 DIAGNOSIS — G8929 Other chronic pain: Secondary | ICD-10-CM | POA: Insufficient documentation

## 2021-01-24 NOTE — Assessment & Plan Note (Signed)
Start over the counter topical diclofenac (voltaren) cream on  Elbow and right knee.Marland Kitchen 4 times daily.

## 2021-01-24 NOTE — Assessment & Plan Note (Signed)
Reviewed home safety and use of assistive device.

## 2021-01-24 NOTE — Assessment & Plan Note (Signed)
We will set you up for referral Physical therapy for balance retraining. Use 4 prong cane. Can try shoes for  neuropathy and balance.  Contributing to frequent falls.

## 2021-01-29 DIAGNOSIS — I482 Chronic atrial fibrillation, unspecified: Secondary | ICD-10-CM | POA: Diagnosis not present

## 2021-01-29 DIAGNOSIS — D802 Selective deficiency of immunoglobulin A [IgA]: Secondary | ICD-10-CM | POA: Diagnosis not present

## 2021-01-29 DIAGNOSIS — Z7901 Long term (current) use of anticoagulants: Secondary | ICD-10-CM | POA: Diagnosis not present

## 2021-01-29 LAB — PROTIME-INR: INR: 1.9 — AB (ref 0.9–1.1)

## 2021-01-29 LAB — POCT INR: INR: 1.9 — AB (ref 2.0–3.0)

## 2021-01-30 ENCOUNTER — Ambulatory Visit (INDEPENDENT_AMBULATORY_CARE_PROVIDER_SITE_OTHER): Payer: PPO

## 2021-01-30 DIAGNOSIS — Z7901 Long term (current) use of anticoagulants: Secondary | ICD-10-CM | POA: Diagnosis not present

## 2021-01-30 NOTE — Patient Instructions (Signed)
Pre visit review using our clinic review tool, if applicable. No additional management support is needed unless otherwise documented below in the visit note.  Increase dose today to take 10 mg (2 tablets) and then change weekly dose to take 5 mg everyday EXCEPT take 2.5 mg on Sundays. Re-check in two weeks.

## 2021-02-13 ENCOUNTER — Telehealth: Payer: Self-pay

## 2021-02-13 NOTE — Telephone Encounter (Signed)
Pt was due for INR check yesterday. Did not receive results. Sent email to [place], Therapist, sports, at [facility]. The schedule was changed due to holiday. She will have her tested Thurs or Monday.

## 2021-02-19 ENCOUNTER — Ambulatory Visit (INDEPENDENT_AMBULATORY_CARE_PROVIDER_SITE_OTHER): Payer: PPO

## 2021-02-19 DIAGNOSIS — Z7901 Long term (current) use of anticoagulants: Secondary | ICD-10-CM

## 2021-02-19 DIAGNOSIS — I482 Chronic atrial fibrillation, unspecified: Secondary | ICD-10-CM | POA: Diagnosis not present

## 2021-02-19 LAB — POCT INR: INR: 2.5 (ref 2.0–3.0)

## 2021-02-19 NOTE — Patient Instructions (Addendum)
Pre visit review using our clinic review tool, if applicable. No additional management support is needed unless otherwise documented below in the visit note.  Fax reporting 2.5 INR. Continue 5 mg everyday EXCEPT take 2.5 mg on Sundays. Re-check in 5 weeks.

## 2021-02-23 DIAGNOSIS — R2681 Unsteadiness on feet: Secondary | ICD-10-CM | POA: Diagnosis not present

## 2021-02-23 DIAGNOSIS — G629 Polyneuropathy, unspecified: Secondary | ICD-10-CM | POA: Diagnosis not present

## 2021-02-23 DIAGNOSIS — R278 Other lack of coordination: Secondary | ICD-10-CM | POA: Diagnosis not present

## 2021-02-23 DIAGNOSIS — R296 Repeated falls: Secondary | ICD-10-CM | POA: Diagnosis not present

## 2021-02-25 ENCOUNTER — Other Ambulatory Visit: Payer: Self-pay | Admitting: Family Medicine

## 2021-02-25 DIAGNOSIS — J3489 Other specified disorders of nose and nasal sinuses: Secondary | ICD-10-CM

## 2021-03-15 ENCOUNTER — Other Ambulatory Visit: Payer: Self-pay

## 2021-03-15 ENCOUNTER — Encounter: Payer: Self-pay | Admitting: Podiatry

## 2021-03-15 ENCOUNTER — Ambulatory Visit: Payer: PPO | Admitting: Podiatry

## 2021-03-15 DIAGNOSIS — D689 Coagulation defect, unspecified: Secondary | ICD-10-CM | POA: Diagnosis not present

## 2021-03-15 DIAGNOSIS — M2042 Other hammer toe(s) (acquired), left foot: Secondary | ICD-10-CM

## 2021-03-15 DIAGNOSIS — M79675 Pain in left toe(s): Secondary | ICD-10-CM

## 2021-03-15 DIAGNOSIS — M2041 Other hammer toe(s) (acquired), right foot: Secondary | ICD-10-CM

## 2021-03-15 DIAGNOSIS — M79674 Pain in right toe(s): Secondary | ICD-10-CM | POA: Diagnosis not present

## 2021-03-15 DIAGNOSIS — M719 Bursopathy, unspecified: Secondary | ICD-10-CM | POA: Insufficient documentation

## 2021-03-15 DIAGNOSIS — M7751 Other enthesopathy of right foot: Secondary | ICD-10-CM | POA: Diagnosis not present

## 2021-03-15 DIAGNOSIS — B351 Tinea unguium: Secondary | ICD-10-CM | POA: Diagnosis not present

## 2021-03-15 NOTE — Progress Notes (Signed)
This patient returns to my office for at risk foot care.  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  Patient is taking coumadin.  This patient is unable to cut nails herself since the patient cannot reach her nails.These nails are painful walking and wearing shoes. This patient says she had severely painful third toe right foot.  She says that a part of the nail came off and her toe has been now improving. This patient presents for at risk foot care today.  General Appearance  Alert, conversant and in no acute stress.  Vascular  Dorsalis pedis and posterior tibial  pulses are not  palpable  bilaterally.  Capillary return is within normal limits  bilaterally. Cold feet.  Absent hair.  Venous stasis dermatitis  Legs  B/L. bilaterally.  Neurologic  Senn-Weinstein monofilament wire test within normal limits  bilaterally. Muscle power within normal limits bilaterally.  Nails Thick disfigured discolored nails with subungual debris  from hallux to fifth toes bilaterally. No evidence of bacterial infection or drainage bilaterally.  Orthopedic  No limitations of motion  feet .  No crepitus or effusions noted.  Hammer toes 2-4  B/L.  Red swollen DIPJ 3rd toe right foot.  Skin  normotropic skin with no porokeratosis noted bilaterally.  No signs of infections or ulcers noted.     Onychomycosis  Pain in right toes  Pain in left toes  Consent was obtained for treatment procedures.  ROV.    Mechanical debridement of nails 1-5  bilaterally performed with a nail nipper.  Filed with dremel without incident.   Gave her padding for third toe right.   Return office visit   3 months                  Told patient to return for periodic foot care and evaluation due to potential at risk complications.   Gardiner Barefoot DPM

## 2021-03-20 ENCOUNTER — Telehealth: Payer: Self-pay | Admitting: Cardiovascular Disease

## 2021-03-20 MED ORDER — WARFARIN SODIUM 5 MG PO TABS: ORAL_TABLET | ORAL | 1 refills | Status: DC

## 2021-03-20 NOTE — Telephone Encounter (Signed)
Patient warfarin/INR managed by PCP.  Will route to [facility] office.

## 2021-03-20 NOTE — Telephone Encounter (Signed)
*  STAT* If patient is at the pharmacy, call can be transferred to refill team.   1. Which medications need to be refilled? (please list name of each medication and dose if known) warfarin  2. Which pharmacy/location (including street and city if local pharmacy) is medication to be sent to? [facility]  on [address]  3. Do they need a 30 day or 90 day supply? [place]

## 2021-03-20 NOTE — Telephone Encounter (Signed)
Pt compliant with coumadin management. Sent in refill. 

## 2021-03-26 ENCOUNTER — Ambulatory Visit (INDEPENDENT_AMBULATORY_CARE_PROVIDER_SITE_OTHER): Payer: PPO

## 2021-03-26 DIAGNOSIS — D802 Selective deficiency of immunoglobulin A [IgA]: Secondary | ICD-10-CM | POA: Diagnosis not present

## 2021-03-26 DIAGNOSIS — Z7901 Long term (current) use of anticoagulants: Secondary | ICD-10-CM | POA: Diagnosis not present

## 2021-03-26 DIAGNOSIS — I482 Chronic atrial fibrillation, unspecified: Secondary | ICD-10-CM | POA: Diagnosis not present

## 2021-03-26 LAB — POCT INR: INR: 2.5 (ref 2.0–3.0)

## 2021-03-26 NOTE — Patient Instructions (Addendum)
Pre visit review using our clinic review tool, if applicable. No additional management support is needed unless otherwise documented below in the visit note.  Fax reporting 2.5 INR. Continue 5 mg everyday EXCEPT take 2.5 mg on Sundays. Re-check in 4 weeks.

## 2021-04-24 DIAGNOSIS — D802 Selective deficiency of immunoglobulin A [IgA]: Secondary | ICD-10-CM | POA: Diagnosis not present

## 2021-04-24 DIAGNOSIS — I482 Chronic atrial fibrillation, unspecified: Secondary | ICD-10-CM | POA: Diagnosis not present

## 2021-04-24 DIAGNOSIS — Z7901 Long term (current) use of anticoagulants: Secondary | ICD-10-CM | POA: Diagnosis not present

## 2021-04-24 LAB — POCT INR: INR: 1.7 — AB (ref 2.0–3.0)

## 2021-04-25 ENCOUNTER — Ambulatory Visit (INDEPENDENT_AMBULATORY_CARE_PROVIDER_SITE_OTHER): Payer: PPO

## 2021-04-25 DIAGNOSIS — Z7901 Long term (current) use of anticoagulants: Secondary | ICD-10-CM

## 2021-04-25 NOTE — Patient Instructions (Addendum)
Pre visit review using our clinic review tool, if applicable. No additional management support is needed unless otherwise documented below in the visit note.  Increase dose today to '10mg'$  and then continue 5 mg everyday EXCEPT take 2.5 mg on Sundays. Re-check in 4 weeks.

## 2021-05-08 ENCOUNTER — Encounter: Payer: PPO | Attending: Physician Assistant | Admitting: Physician Assistant

## 2021-05-08 ENCOUNTER — Other Ambulatory Visit: Payer: Self-pay

## 2021-05-08 DIAGNOSIS — L97812 Non-pressure chronic ulcer of other part of right lower leg with fat layer exposed: Secondary | ICD-10-CM | POA: Insufficient documentation

## 2021-05-08 DIAGNOSIS — Z7901 Long term (current) use of anticoagulants: Secondary | ICD-10-CM | POA: Diagnosis not present

## 2021-05-08 DIAGNOSIS — I480 Paroxysmal atrial fibrillation: Secondary | ICD-10-CM | POA: Insufficient documentation

## 2021-05-08 DIAGNOSIS — Z87891 Personal history of nicotine dependence: Secondary | ICD-10-CM | POA: Insufficient documentation

## 2021-05-08 DIAGNOSIS — I872 Venous insufficiency (chronic) (peripheral): Secondary | ICD-10-CM | POA: Insufficient documentation

## 2021-05-08 DIAGNOSIS — Z882 Allergy status to sulfonamides status: Secondary | ICD-10-CM | POA: Insufficient documentation

## 2021-05-08 NOTE — Progress Notes (Signed)
[name], [name] ([id number]) Visit Report for 05/08/2021 Abuse/Suicide Risk Screen Details Patient Name: [name], [name] Date of Service: 05/08/2021 12:45 PM Medical Record Number: [id number] Patient Account Number: [id number] Date of Birth/Sex: 01/07/1932 (87 y.o. F) Treating RN: [name] Primary Care [name]: [name] Other Clinician: Referring [name]: Referral, Self Treating [name]/Extender: [name] in Treatment: 0 Abuse/Suicide Risk Screen Items Answer ABUSE RISK SCREEN: Has anyone close to you tried to hurt or harm you recentlyo No Do you feel uncomfortable with anyone in your familyo No Has anyone forced you do things that you didnot want to doo No Electronic Signature(s) Signed: 05/08/2021 4:46:45 PM By: Mackenzie Amen RN Entered By: [name] on 05/08/2021 13:12:18 [name], Mackenzie Starch ([id number]) -------------------------------------------------------------------------------- Activities of Daily Living Details Patient Name: [name], [name] Date of Service: 05/08/2021 12:45 PM Medical Record Number: [id number] Patient Account Number: [id number] Date of Birth/Sex: September 18, 1931 (87 y.o. F) Treating RN: [name] Primary Care [name]: [name] Other Clinician: Referring [name]: Referral, Self Treating [name]/Extender: [name] in Treatment: 0 Activities of Daily Living Items Answer Activities of Daily Living (Please select one for each item) Drive Automobile Completely Able Take Medications Completely Able Use Telephone Completely Able Care for Appearance Completely Able Use Toilet Completely Able Bath / Shower Completely Able Dress Self Completely Able Feed Self Completely Able Walk Completely Able Get In / Out Bed Completely Able Housework Completely Able Prepare Meals Completely Able Handle Money Completely Able Shop for Self Completely Able Electronic Signature(s) Signed: 05/08/2021 4:46:45 PM By: Mackenzie Amen  RN Entered By: [name] on 05/08/2021 13:12:38 [name], Mackenzie Starch ([id number]) -------------------------------------------------------------------------------- Education Screening Details Patient Name: [name] Date of Service: 05/08/2021 12:45 PM Medical Record Number: [id number] Patient Account Number: [id number] Date of Birth/Sex: 1932-04-10 (87 y.o. F) Treating RN: [name] Primary Care [name]: [name] Other Clinician: Referring [name]: Referral, Self Treating [name]/Extender: [name] in Treatment: 0 Primary Learner Assessed: Patient Learning Preferences/Education Level/Primary Language Learning Preference: Explanation, Demonstration Highest Education Level: College or Above Preferred Language: English Cognitive Barrier Language Barrier: No Translator Needed: No Memory Deficit: No Emotional Barrier: No Cultural/Religious Beliefs Affecting Medical Care: No Physical Barrier Impaired Vision: No Impaired Hearing: No Decreased Hand dexterity: No Knowledge/Comprehension Knowledge Level: Medium Comprehension Level: Medium Ability to understand written instructions: Medium Ability to understand verbal instructions: Medium Motivation Anxiety Level: Calm Cooperation: Cooperative Education Importance: Acknowledges Need Interest in Health Problems: Asks Questions Perception: Coherent Willingness to Engage in Self-Management Medium Activities: Readiness to Engage in Self-Management Medium Activities: Electronic Signature(s) Signed: 05/08/2021 4:46:45 PM By: Mackenzie Amen RN Entered By: [name] on 05/08/2021 13:13:08 [name], Mackenzie Starch ([id number]) -------------------------------------------------------------------------------- Fall Risk Assessment Details Patient Name: [name] Date of Service: 05/08/2021 12:45 PM Medical Record Number: [id number] Patient Account Number: [id number] Date of Birth/Sex: October 13, 1931 (87 y.o.  F) Treating RN: [name] Primary Care [name]: [name] Other Clinician: Referring [name]: Referral, Self Treating [name]/Extender: [name] in Treatment: 0 Fall Risk Assessment Items Have you had 2 or more falls in the last 12 monthso 0 No Have you had any fall that resulted in injury in the last 12 monthso 0 No FALLS RISK SCREEN History of falling - immediate or within 3 months 0 No Secondary diagnosis (Do you have 2 or more medical diagnoseso) 15 Yes Ambulatory aid None/bed rest/wheelchair/nurse 0 No Crutches/cane/walker 15 Yes Furniture 0 No Intravenous therapy Access/Saline/Heparin Lock 0 No Gait/Transferring Normal/ bed rest/  wheelchair 0 No Weak (short steps with or without shuffle, stooped but able to lift head while walking, may 10 Yes seek support from furniture) Impaired (short steps with shuffle, may have difficulty arising from chair, head down, impaired 0 No balance) Mental Status Oriented to own ability 0 Yes Electronic Signature(s) Signed: 05/08/2021 4:46:45 PM By: Mackenzie Amen RN Entered By: [name] on 05/08/2021 13:13:19 [name], Mackenzie Starch ([id number]) -------------------------------------------------------------------------------- Foot Assessment Details Patient Name: [name] Date of Service: 05/08/2021 12:45 PM Medical Record Number: [id number] Patient Account Number: [id number] Date of Birth/Sex: 1932-03-13 (87 y.o. F) Treating RN: [name] Primary Care [name]: [name] Other Clinician: Referring [name]: Referral, Self Treating [name]/Extender: [name] in Treatment: 0 Foot Assessment Items Site Locations + = Sensation present, - = Sensation absent, C = Callus, U = Ulcer R = Redness, W = Warmth, M = Maceration, PU = Pre-ulcerative lesion F = Fissure, S = Swelling, D = Dryness Assessment Right: Left: Other Deformity: No No Prior Foot Ulcer: No No Prior Amputation: No No Charcot Joint: No  No Ambulatory Status: Ambulatory With Help Assistance Device: Cane Gait: Steady Electronic Signature(s) Signed: 05/08/2021 4:46:45 PM By: Mackenzie Amen RN Entered By: [name] on 05/08/2021 13:13:41 Marovich, Mackenzie Starch ([id number]) -------------------------------------------------------------------------------- Nutrition Risk Screening Details Patient Name: [name] Date of Service: 05/08/2021 12:45 PM Medical Record Number: [id number] Patient Account Number: [id number] Date of Birth/Sex: 1931-09-29 (87 y.o. F) Treating RN: [name] Primary Care Juvon Teater: [name] Other Clinician: Referring Aison Malveaux: Referral, Self Treating Kashae Carstens/Extender: [name] in Treatment: 0 Height (in): Weight (lbs): 131 Body Mass Index (BMI): Nutrition Risk Screening Items Score Screening NUTRITION RISK SCREEN: I have an illness or condition that made me change the kind and/or amount of food I eat 0 No I eat fewer than two meals per day 0 No I eat few fruits and vegetables, or milk products 0 No I have three or more drinks of beer, liquor or wine almost every day 0 No I have tooth or mouth problems that make it hard for me to eat 0 No I don't always have enough money to buy the food I need 0 No I eat alone most of the time 0 No I take three or more different prescribed or over-the-counter drugs a day 1 Yes Without wanting to, I have lost or gained 10 pounds in the last six months 0 No I am not always physically able to shop, cook and/or feed myself 0 No Nutrition Protocols Good Risk Protocol 0 No interventions needed Moderate Risk Protocol High Risk Proctocol Risk Level: Good Risk Score: 1 Electronic Signature(s) Signed: 05/08/2021 4:46:45 PM By: Mackenzie Amen RN Entered By: [name] on 05/08/2021 13:13:28

## 2021-05-08 NOTE — Progress Notes (Signed)
[name], [name] ([id number]) Visit Report for 05/08/2021 Chief Complaint Document Details Patient Name: [name], [name] Date of Service: 05/08/2021 12:45 PM Medical Record Number: [id number] Patient Account Number: [id number] Date of Birth/Sex: 08/10/1932 (87 y.o. F) Treating RN: Dolan Amen Primary Care Provider: [name] Other Clinician: Referring Provider: Referral, Self Treating Provider/Extender: Skipper Cliche in Treatment: 0 Information Obtained from: Patient Chief Complaint Right LE Ulcer Electronic Signature(s) Signed: 05/08/2021 1:54:21 PM By: Worthy Keeler PA-C Entered By: Worthy Keeler on 05/08/2021 13:54:21 [name] ([id number]) -------------------------------------------------------------------------------- Debridement Details Patient Name: [name] Date of Service: 05/08/2021 12:45 PM Medical Record Number: [id number] Patient Account Number: [id number] Date of Birth/Sex: [birth year] (87 y.o. F) Treating RN: Dolan Amen Primary Care Provider: [name] Other Clinician: Referring Provider: Referral, Self Treating Provider/Extender: Skipper Cliche in Treatment: 0 Debridement Performed for Wound #1 Right,Anterior Lower Leg Assessment: Performed By: Physician Tommie Sams., PA-C Debridement Type: Chemical/Enzymatic/Mechanical Agent Used: saline gauze Severity of Tissue Pre Debridement: Fat layer exposed Level of Consciousness (Pre- Awake and Alert procedure): Pre-procedure Verification/Time Out Yes - 13:58 Taken: Start Time: 13:58 Instrument: Other : saline gauze Bleeding: Minimum Hemostasis Achieved: Pressure Response to Treatment: Procedure was tolerated well Level of Consciousness (Post- Awake and Alert procedure): Post Debridement Measurements of Total Wound Length: (cm) 1.6 Width: (cm) 1.5 Depth: (cm) 0.1 Volume: (cm) 0.188 Character of Wound/Ulcer Post Debridement: Stable Severity of Tissue Post Debridement: Fat layer  exposed Post Procedure Diagnosis Same as Pre-procedure Electronic Signature(s) Signed: 05/08/2021 4:46:45 PM By: Dolan Amen RN Signed: 05/08/2021 5:51:22 PM By: Worthy Keeler PA-C Entered By: Dolan Amen on 05/08/2021 13:58:53 [name] ([id number]) -------------------------------------------------------------------------------- HPI Details Patient Name: [name] Date of Service: 05/08/2021 12:45 PM Medical Record Number: [id number] Patient Account Number: [id number] Date of Birth/Sex: 07-27-1932 (87 y.o. F) Treating RN: Dolan Amen Primary Care Provider: [name] Other Clinician: Referring Provider: Referral, Self Treating Provider/Extender: Skipper Cliche in Treatment: 0 History of Present Illness HPI Description: 05/08/2021 upon evaluation today patient appears for initial inspection here in the clinic concerning issues that she has been having actually with a wound over the anterior portion of her right leg. She tells me that this was due to an injury where she struck this on something causing the initial trauma and she has been having trouble getting it healed. [name] over at [facility] has been helping take care of this. With that being said because it was not healing quite as quickly and effectively is what she wanted to see she did want [place] to take a look and make any recommendations to try to help things along. I am definitely happy to do that for sure. Fortunately there does not appear to be any signs of active infection at this time which is great news. The patient also does not seem to have too deep of the wound which is also good news. Her biggest concern is she really does not want to come weekly to the clinic especially since she has care at the [facility] location as far as getting the dressing changed and keeping an eye on it. She does have a history of atrial fibrillation for which she is on warfarin and she does not like to do water aerobics  twice a week although she is not able to right now due to the fact that she has this wound and cannot get in the water. That is a significant issue for her  at this point. Otherwise does have noncompressible pulses based on what we are seeing today. She wears compression socks for her chronic venous insufficiency she has had previous ablation therapy. Electronic Signature(s) Signed: 05/08/2021 5:17:34 PM By: Worthy Keeler PA-C Entered By: Worthy Keeler on 05/08/2021 17:17:34 [name] ([id number]) -------------------------------------------------------------------------------- Physical Exam Details Patient Name: [name] Date of Service: 05/08/2021 12:45 PM Medical Record Number: [id number] Patient Account Number: [id number] Date of Birth/Sex: [birth year] (87 y.o. F) Treating RN: Dolan Amen Primary Care Provider: [name] Other Clinician: Referring Provider: Referral, Self Treating Provider/Extender: Skipper Cliche in Treatment: 0 Constitutional sitting or standing blood pressure is within target range for patient.. pulse regular and within target range for patient.Marland Kitchen respirations regular, non- labored and within target range for patient.Marland Kitchen temperature within target range for patient.. Well-nourished and well-hydrated in no acute distress. Eyes conjunctiva clear no eyelid edema noted. pupils equal round and reactive to light and accommodation. Ears, Nose, Mouth, and Throat no gross abnormality of ear auricles or external auditory canals. normal hearing noted during conversation. mucus membranes moist. Respiratory normal breathing without difficulty. Cardiovascular 2+ dorsalis pedis/posterior tibialis pulses. 1+ pitting edema of the bilateral lower extremities. Musculoskeletal normal gait and posture. no significant deformity or arthritic changes, no loss or range of motion, no clubbing. Psychiatric this patient is able to make decisions and demonstrates good insight  into disease process. Alert and Oriented x 3. pleasant and cooperative. Notes Upon inspection patient's wound bed actually showed signs of good granulation epithelization at this point there was some slough and biofilm on the surface of the wound of the edge able to clear this away with saline and gauze she did not want me to debride due to the fact that she bleeds quite significantly. The good news is I related to like we needed to I was able to clear away what we needed on the surface and underlying seem to be quite healthy. I do think she would benefit from the use of Hydrofera Blue and that is my suggestion for her currently as far as the dressing of choice is concerned. She voiced understanding. She seems to have good blood flow to be honest. Electronic Signature(s) Signed: 05/08/2021 5:18:21 PM By: Worthy Keeler PA-C Entered By: Worthy Keeler on 05/08/2021 17:18:21 Cruzen, Karle [name] ([id number]) -------------------------------------------------------------------------------- Physician Orders Details Patient Name: [name] Date of Service: 05/08/2021 12:45 PM Medical Record Number: [id number] Patient Account Number: [id number] Date of Birth/Sex: [birth year] (87 y.o. F) Treating RN: Dolan Amen Primary Care Provider: [name] Other Clinician: Referring Provider: Referral, Self Treating Provider/Extender: Skipper Cliche in Treatment: 0 Verbal / Phone Orders: No Diagnosis Coding ICD-10 Coding Code Description I87.2 Venous insufficiency (chronic) (peripheral) L97.812 Non-pressure chronic ulcer of other part of right lower leg with fat layer exposed I48.0 Paroxysmal atrial fibrillation Z79.01 Long term (current) use of anticoagulants I73.89 Other specified peripheral vascular diseases Follow-up Appointments o Return Appointment in 3 weeks. Bathing/ Shower/ Hygiene o May shower; gently cleanse wound with antibacterial soap, rinse and pat dry prior to dressing  wounds Edema Control - Lymphedema / Segmental Compressive Device / Other Bilateral Lower Extremities o Patient to wear own compression stockings. Remove compression stockings every night before going to bed and put on every morning when getting up. o Elevate, Exercise Daily and Avoid Standing for Long Periods of Time. o Elevate legs to the level of the heart and pump ankles as often as possible o Elevate leg(s) parallel  to the floor when sitting. Wound Treatment Wound #1 - Lower Leg Wound Laterality: Right, Anterior Cleanser: Byram Ancillary Kit - 15 Day Supply (DME) (Generic) 3 x Per Week/30 Days Discharge Instructions: Use supplies as instructed; Kit contains: (15) Saline Bullets; (15) 3x3 Gauze; 15 pr Gloves Cleanser: Normal Saline 3 x Per Week/30 Days Discharge Instructions: Wash your hands with soap and water. Remove old dressing, discard into plastic bag and place into trash. Cleanse the wound with Normal Saline prior to applying a clean dressing using gauze sponges, not tissues or cotton balls. Do not scrub or use excessive force. Pat dry using gauze sponges, not tissue or cotton balls. Primary Dressing: Hydrofera Blue Ready Transfer Foam, 2.5x2.5 (in/in) (DME) (Generic) 3 x Per Week/30 Days Discharge Instructions: Cut Hydrofera Blue Ready about twice the size of wound, apply to wound bed Primary Dressing: Zetuvit Plus Silicone Border Dressing 4x4 (in/in) (DME) (Generic) 3 x Per Week/30 Days Discharge Instructions: Secure hydrofera blue Electronic Signature(s) Signed: 05/08/2021 3:19:15 PM By: Dolan Amen RN Signed: 05/08/2021 5:51:22 PM By: Worthy Keeler PA-C Entered By: Dolan Amen on 05/08/2021 15:19:14 Abreu, Karle [name] ([id number]) -------------------------------------------------------------------------------- Problem List Details Patient Name: [name] Date of Service: 05/08/2021 12:45 PM Medical Record Number: [id number] Patient Account Number:  [id number] Date of Birth/Sex: 1932-04-16 (87 y.o. F) Treating RN: Dolan Amen Primary Care Provider: [name] Other Clinician: Referring Provider: Referral, Self Treating Provider/Extender: Skipper Cliche in Treatment: 0 Active Problems ICD-10 Encounter Code Description Active Date MDM Diagnosis I87.2 Venous insufficiency (chronic) (peripheral) 05/08/2021 No Yes L97.812 Non-pressure chronic ulcer of other part of right lower leg with fat layer 05/08/2021 No Yes exposed I48.0 Paroxysmal atrial fibrillation 05/08/2021 No Yes Z79.01 Long term (current) use of anticoagulants 05/08/2021 No Yes I73.89 Other specified peripheral vascular diseases 05/08/2021 No Yes Inactive Problems Resolved Problems Electronic Signature(s) Signed: 05/08/2021 1:53:55 PM By: Worthy Keeler PA-C Entered By: Worthy Keeler on 05/08/2021 13:53:54 [place], Karle [name] ([id number]) -------------------------------------------------------------------------------- Progress Note Details Patient Name: [name] Date of Service: 05/08/2021 12:45 PM Medical Record Number: [id number] Patient Account Number: [id number] Date of Birth/Sex: Mar 12, 1932 (87 y.o. F) Treating RN: Dolan Amen Primary Care Provider: [name] Other Clinician: Referring Provider: Referral, Self Treating Provider/Extender: Skipper Cliche in Treatment: 0 Subjective Chief Complaint Information obtained from Patient Right LE Ulcer History of Present Illness (HPI) 05/08/2021 upon evaluation today patient appears for initial inspection here in the clinic concerning issues that she has been having actually with a wound over the anterior portion of her right leg. She tells me that this was due to an injury where she struck this on something causing the initial trauma and she has been having trouble getting it healed. [name] over at [facility] has been helping take care of this. With that being said because it was not healing quite as  quickly and effectively is what she wanted to see she did want [place] to take a look and make any recommendations to try to help things along. I am definitely happy to do that for sure. Fortunately there does not appear to be any signs of active infection at this time which is great news. The patient also does not seem to have too deep of the wound which is also good news. Her biggest concern is she really does not want to come weekly to the clinic especially since she has care at the [facility] location as far as getting the dressing changed and  keeping an eye on it. She does have a history of atrial fibrillation for which she is on warfarin and she does not like to do water aerobics twice a week although she is not able to right now due to the fact that she has this wound and cannot get in the water. That is a significant issue for her at this point. Otherwise does have noncompressible pulses based on what we are seeing today. She wears compression socks for her chronic venous insufficiency she has had previous ablation therapy. Patient History Information obtained from Patient. Allergies Sulfa (Sulfonamide Antibiotics) Social History Former smoker. Medical History Eyes Patient has history of Cataracts Cardiovascular Patient has history of Arrhythmia - Afib Endocrine Denies history of Type I Diabetes, Type II Diabetes Musculoskeletal Patient has history of Osteoarthritis Neurologic Patient has history of Neuropathy Review of Systems (ROS) Constitutional Symptoms (General Health) Denies complaints or symptoms of Fatigue, Fever, Chills, Marked Weight Change. Eyes Denies complaints or symptoms of Dry Eyes, Vision Changes, Glasses / Contacts. Ear/Nose/Mouth/Throat Denies complaints or symptoms of Difficult clearing ears, Sinusitis. Hematologic/Lymphatic Denies complaints or symptoms of Bleeding / Clotting Disorders, Human Immunodeficiency Virus. Respiratory Denies complaints or symptoms  of Chronic or frequent coughs, Shortness of Breath. Cardiovascular Complains or has symptoms of LE edema. Denies complaints or symptoms of Chest pain. Gastrointestinal Denies complaints or symptoms of Frequent diarrhea, Nausea, Vomiting. Endocrine Complains or has symptoms of Thyroid disease. Denies complaints or symptoms of Hepatitis, Polydypsia (Excessive Thirst). Genitourinary Denies complaints or symptoms of Kidney failure/ Dialysis, Incontinence/dribbling. Immunological Denies complaints or symptoms of Hives, Itching. Longino, Karle [name] ([id number]) Integumentary (Skin) Complains or has symptoms of Wounds. Denies complaints or symptoms of Bleeding or bruising tendency, Breakdown, Swelling. Musculoskeletal Denies complaints or symptoms of Muscle Pain, Muscle Weakness. Neurologic Denies complaints or symptoms of Numbness/parasthesias, Focal/Weakness. Psychiatric Denies complaints or symptoms of Anxiety, Claustrophobia. Objective Constitutional sitting or standing blood pressure is within target range for patient.. pulse regular and within target range for patient.Marland Kitchen respirations regular, non- labored and within target range for patient.Marland Kitchen temperature within target range for patient.. Well-nourished and well-hydrated in no acute distress. Vitals Time Taken: 1:05 PM, Weight: 131 lbs, Source: Measured, Temperature: 98.6 F, Pulse: 105 bpm, Respiratory Rate: 18 breaths/min, Blood Pressure: 133/81 mmHg. Eyes conjunctiva clear no eyelid edema noted. pupils equal round and reactive to light and accommodation. Ears, Nose, Mouth, and Throat no gross abnormality of ear auricles or external auditory canals. normal hearing noted during conversation. mucus membranes moist. Respiratory normal breathing without difficulty. Cardiovascular 2+ dorsalis pedis/posterior tibialis pulses. 1+ pitting edema of the bilateral lower extremities. Musculoskeletal normal gait and posture. no significant  deformity or arthritic changes, no loss or range of motion, no clubbing. Psychiatric this patient is able to make decisions and demonstrates good insight into disease process. Alert and Oriented x 3. pleasant and cooperative. General Notes: Upon inspection patient's wound bed actually showed signs of good granulation epithelization at this point there was some slough and biofilm on the surface of the wound of the edge able to clear this away with saline and gauze she did not want me to debride due to the fact that she bleeds quite significantly. The good news is I related to like we needed to I was able to clear away what we needed on the surface and underlying seem to be quite healthy. I do think she would benefit from the use of Hydrofera Blue and that is my suggestion for her currently as far as  the dressing of choice is concerned. She voiced understanding. She seems to have good blood flow to be honest. Integumentary (Hair, Skin) Wound #1 status is Open. Original cause of wound was Hematoma. The date acquired was: 04/13/2021. The wound is located on the Right,Anterior Lower Leg. The wound measures 1.6cm length x 1.5cm width x 0.1cm depth; 1.885cm^2 area and 0.188cm^3 volume. There is Fat Layer (Subcutaneous Tissue) exposed. There is no tunneling or undermining noted. There is a medium amount of serosanguineous drainage noted. The wound margin is flat and intact. There is large (67-100%) red granulation within the wound bed. There is no necrotic tissue within the wound bed. Assessment Active Problems ICD-10 Venous insufficiency (chronic) (peripheral) Non-pressure chronic ulcer of other part of right lower leg with fat layer exposed Paroxysmal atrial fibrillation Long term (current) use of anticoagulants Other specified peripheral vascular diseases Strider, Karle [name] ([id number]) Procedures Wound #1 Pre-procedure diagnosis of Wound #1 is a Venous Leg Ulcer located on the Right,Anterior Lower  Leg .Severity of Tissue Pre Debridement is: Fat layer exposed. There was a Chemical/Enzymatic/Mechanical debridement performed by Tommie Sams., PA-C. With the following instrument(s): saline gauze. Other agent used was saline gauze. A time out was conducted at 13:58, prior to the start of the procedure. A Minimum amount of bleeding was controlled with Pressure. The procedure was tolerated well. Post Debridement Measurements: 1.6cm length x 1.5cm width x 0.1cm depth; 0.188cm^3 volume. Character of Wound/Ulcer Post Debridement is stable. Severity of Tissue Post Debridement is: Fat layer exposed. Post procedure Diagnosis Wound #1: Same as Pre-Procedure Plan Follow-up Appointments: Return Appointment in 3 weeks. Bathing/ Shower/ Hygiene: May shower; gently cleanse wound with antibacterial soap, rinse and pat dry prior to dressing wounds Edema Control - Lymphedema / Segmental Compressive Device / Other: Patient to wear own compression stockings. Remove compression stockings every night before going to bed and put on every morning when getting up. Elevate, Exercise Daily and Avoid Standing for Long Periods of Time. Elevate legs to the level of the heart and pump ankles as often as possible Elevate leg(s) parallel to the floor when sitting. WOUND #1: - Lower Leg Wound Laterality: Right, Anterior Cleanser: Byram Ancillary Kit - 15 Day Supply (DME) (Generic) 3 x Per Week/30 Days Discharge Instructions: Use supplies as instructed; Kit contains: (15) Saline Bullets; (15) 3x3 Gauze; 15 pr Gloves Cleanser: Normal Saline 3 x Per Week/30 Days Discharge Instructions: Wash your hands with soap and water. Remove old dressing, discard into plastic bag and place into trash. Cleanse the wound with Normal Saline prior to applying a clean dressing using gauze sponges, not tissues or cotton balls. Do not scrub or use excessive force. Pat dry using gauze sponges, not tissue or cotton balls. Primary Dressing:  Hydrofera Blue Ready Transfer Foam, 2.5x2.5 (in/in) (DME) (Generic) 3 x Per Week/30 Days Discharge Instructions: Cut Hydrofera Blue Ready about twice the size of wound, apply to wound bed Primary Dressing: Zetuvit Plus Silicone Border Dressing 4x4 (in/in) (DME) (Generic) 3 x Per Week/30 Days Discharge Instructions: Secure hydrofera blue 1. Would recommend currently that we going continue with the recommendation to have the nurses at [facility] care for the patient on a weekly basis they are changing the dressings on Monday, Wednesday, and Friday. We will get in touch with him today as well. 2. I am going to suggest switching however to [facility] which I think will be a better option for her as far as treatment is concerned. 3. I am also  going to recommend that we use a Zetuvit border foam dressing to cover. 4. I am also going to suggest the patient should continue to use her own compression sock in order to keep things under control as far as swelling is concerned specially since were not wrapping her and seeing her weekly at this point. We will see patient back for reevaluation in 1 week here in the clinic. If anything worsens or changes patient will contact our office for additional recommendations. If she does not show signs of improvement with the current measures then we will make adjustments as necessary. Electronic Signature(s) Signed: 05/08/2021 5:19:22 PM By: Worthy Keeler PA-C Entered By: Worthy Keeler on 05/08/2021 17:19:22 Soots, Karle [name] ([id number]) -------------------------------------------------------------------------------- ROS/PFSH Details Patient Name: [name] Date of Service: 05/08/2021 12:45 PM Medical Record Number: [id number] Patient Account Number: [id number] Date of Birth/Sex: 09-21-1931 (87 y.o. F) Treating RN: Dolan Amen Primary Care Provider: [name] Other Clinician: Referring Provider: Referral, Self Treating Provider/Extender: Skipper Cliche in Treatment: 0 Information Obtained From Patient Constitutional Symptoms (General Health) Complaints and Symptoms: Negative for: Fatigue; Fever; Chills; Marked Weight Change Eyes Complaints and Symptoms: Negative for: Dry Eyes; Vision Changes; Glasses / Contacts Medical History: Positive for: Cataracts Ear/Nose/Mouth/Throat Complaints and Symptoms: Negative for: Difficult clearing ears; Sinusitis Hematologic/Lymphatic Complaints and Symptoms: Negative for: Bleeding / Clotting Disorders; Human Immunodeficiency Virus Respiratory Complaints and Symptoms: Negative for: Chronic or frequent coughs; Shortness of Breath Cardiovascular Complaints and Symptoms: Positive for: LE edema Negative for: Chest pain Medical History: Positive for: Arrhythmia - Afib Gastrointestinal Complaints and Symptoms: Negative for: Frequent diarrhea; Nausea; Vomiting Endocrine Complaints and Symptoms: Positive for: Thyroid disease Negative for: Hepatitis; Polydypsia (Excessive Thirst) Medical History: Negative for: Type I Diabetes; Type II Diabetes Genitourinary Complaints and Symptoms: Negative for: Kidney failure/ Dialysis; Incontinence/dribbling Hillery, Karle [name] ([id number]) Immunological Complaints and Symptoms: Negative for: Hives; Itching Integumentary (Skin) Complaints and Symptoms: Positive for: Wounds Negative for: Bleeding or bruising tendency; Breakdown; Swelling Musculoskeletal Complaints and Symptoms: Negative for: Muscle Pain; Muscle Weakness Medical History: Positive for: Osteoarthritis Neurologic Complaints and Symptoms: Negative for: Numbness/parasthesias; Focal/Weakness Medical History: Positive for: Neuropathy Psychiatric Complaints and Symptoms: Negative for: Anxiety; Claustrophobia Oncologic HBO Extended History Items Eyes: Cataracts Immunizations Pneumococcal Vaccine: Received Pneumococcal Vaccination: Yes Received Pneumococcal Vaccination On or  After 60th Birthday: Yes Implantable Devices None Family and Social History Former smoker Engineer, maintenance) Signed: 05/08/2021 4:46:45 PM By: Dolan Amen RN Signed: 05/08/2021 5:51:22 PM By: Worthy Keeler PA-C Entered By: Dolan Amen on 05/08/2021 13:12:13 Tomaro, Karle [name] ([id number]) -------------------------------------------------------------------------------- [place] Details Patient Name: [name] Date of Service: 05/08/2021 Medical Record Number: [id number] Patient Account Number: [id number] Date of Birth/Sex: [birth year] (87 y.o. F) Treating RN: Dolan Amen Primary Care Provider: [name] Other Clinician: Referring Provider: Referral, Self Treating Provider/Extender: Skipper Cliche in Treatment: 0 Diagnosis Coding ICD-10 Codes Code Description I87.2 Venous insufficiency (chronic) (peripheral) L97.812 Non-pressure chronic ulcer of other part of right lower leg with fat layer exposed I48.0 Paroxysmal atrial fibrillation Z79.01 Long term (current) use of anticoagulants I73.89 Other specified peripheral vascular diseases Facility Procedures CPT4 Code: 51700174 Description: 99214 - WOUND CARE VISIT-LEV 4 EST PT Modifier: Quantity: 1 Physician Procedures CPT4 Code: 9449675 Description: 91638 - WC PHYS LEVEL 4 - NEW PT Modifier: Quantity: 1 CPT4 Code: Description: ICD-10 Diagnosis Description I87.2 Venous insufficiency (chronic) (peripheral) 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

## 2021-05-08 NOTE — Progress Notes (Signed)
[name], [name] ([id number]) Visit Report for 05/08/2021 Allergy List Details Patient Name: [name], [name] Date of Service: 05/08/2021 12:45 PM Medical Record Number: [id number] Patient Account Number: [id number] Date of Birth/Sex: 07-Jul-1932 (87 y.o. F) Treating RN: Dolan Amen Primary Care Milayah Krell: [name] Other Clinician: Referring Lashaun Krapf: Referral, Self Treating Ahna Konkle/Extender: Skipper Cliche in Treatment: 0 Allergies Active Allergies Sulfa (Sulfonamide Antibiotics) Allergy Notes Electronic Signature(s) Signed: 05/08/2021 4:46:45 PM By: Dolan Amen RN Entered By: Dolan Amen on 05/08/2021 13:09:56 [name] ([id number]) -------------------------------------------------------------------------------- [place] Details Patient Name: [name] Date of Service: 05/08/2021 12:45 PM Medical Record Number: [id number] Patient Account Number: [id number] Date of Birth/Sex: 08-25-1932 (87 y.o. F) Treating RN: Dolan Amen Primary Care Calista Crain: [name] Other Clinician: Referring Nikkia Devoss: Referral, Self Treating Weslyn Holsonback/Extender: Skipper Cliche in Treatment: 0 Visit Information Patient Arrived: Cane Arrival Time: 12:58 Accompanied By: self Transfer Assistance: None Patient Identification Verified: Yes Secondary Verification Process Completed: Yes Patient Has Alerts: Yes Patient Alerts: Patient on Blood Thinner Electronic Signature(s) Signed: 05/08/2021 4:46:45 PM By: Dolan Amen RN Entered By: Dolan Amen on 05/08/2021 13:34:12 [name] ([id number]) -------------------------------------------------------------------------------- Clinic Level of Care Assessment Details Patient Name: [name] Date of Service: 05/08/2021 12:45 PM Medical Record Number: [id number] Patient Account Number: [id number] Date of Birth/Sex: [birth year] (87 y.o. F) Treating RN: Dolan Amen Primary Care Damon Hargrove: [name] Other  Clinician: Referring Veralyn Lopp: Referral, Self Treating Serigne Kubicek/Extender: Skipper Cliche in Treatment: 0 Clinic Level of Care Assessment Items TOOL 2 Quantity Score X - Use when only an EandM is performed on the INITIAL visit 1 0 ASSESSMENTS - Nursing Assessment / Reassessment X - General Physical Exam (combine w/ comprehensive assessment (listed just below) when performed on new 1 20 pt. evals) X- 1 25 Comprehensive Assessment (HX, ROS, Risk Assessments, Wounds Hx, etc.) ASSESSMENTS - Wound and Skin Assessment / Reassessment X - Simple Wound Assessment / Reassessment - one wound 1 5 '[]'$  - 0 Complex Wound Assessment / Reassessment - multiple wounds '[]'$  - 0 Dermatologic / Skin Assessment (not related to wound area) ASSESSMENTS - Ostomy and/or Continence Assessment and Care '[]'$  - Incontinence Assessment and Management 0 '[]'$  - 0 Ostomy Care Assessment and Management (repouching, etc.) PROCESS - Coordination of Care X - Simple Patient / Family Education for ongoing care 1 15 '[]'$  - 0 Complex (extensive) Patient / Family Education for ongoing care X- 1 10 Staff obtains Programmer, systems, Records, Test Results / Process Orders '[]'$  - 0 Staff telephones HHA, Nursing Homes / Clarify orders / etc '[]'$  - 0 Routine Transfer to another Facility (non-emergent condition) '[]'$  - 0 Routine Hospital Admission (non-emergent condition) X- 1 15 New Admissions / Biomedical engineer / Ordering NPWT, Apligraf, etc. '[]'$  - 0 Emergency Hospital Admission (emergent condition) X- 1 10 Simple Discharge Coordination '[]'$  - 0 Complex (extensive) Discharge Coordination PROCESS - Special Needs '[]'$  - Pediatric / Minor Patient Management 0 '[]'$  - 0 Isolation Patient Management '[]'$  - 0 Hearing / Language / Visual special needs '[]'$  - 0 Assessment of Community assistance (transportation, D/C planning, etc.) '[]'$  - 0 Additional assistance / Altered mentation '[]'$  - 0 Support Surface(s) Assessment (bed, cushion, seat,  etc.) INTERVENTIONS - Wound Cleansing / Measurement X - Wound Imaging (photographs - any number of wounds) 1 5 '[]'$  - 0 Wound Tracing (instead of photographs) X- 1 5 Simple Wound Measurement - one wound '[]'$  - 0 Complex Wound Measurement - multiple wounds [name] ([id number])  X- 1 5 Simple Wound Cleansing - one wound '[]'$  - 0 Complex Wound Cleansing - multiple wounds INTERVENTIONS - Wound Dressings '[]'$  - Small Wound Dressing one or multiple wounds 0 X- 1 15 Medium Wound Dressing one or multiple wounds '[]'$  - 0 Large Wound Dressing one or multiple wounds '[]'$  - 0 Application of Medications - injection INTERVENTIONS - Miscellaneous '[]'$  - External ear exam 0 '[]'$  - 0 Specimen Collection (cultures, biopsies, blood, body fluids, etc.) '[]'$  - 0 Specimen(s) / Culture(s) sent or taken to Lab for analysis '[]'$  - 0 Patient Transfer (multiple staff / Civil Service fast streamer / Similar devices) '[]'$  - 0 Simple Staple / Suture removal (25 or less) '[]'$  - 0 Complex Staple / Suture removal (26 or more) '[]'$  - 0 Hypo / Hyperglycemic Management (close monitor of Blood Glucose) X- 1 15 Ankle / Brachial Index (ABI) - do not check if billed separately Has the patient been seen at the hospital within the last three years: Yes Total Score: 145 Level Of Care: New/Established - Level 4 Electronic Signature(s) Signed: 05/08/2021 4:46:45 PM By: Dolan Amen RN Entered By: Dolan Amen on 05/08/2021 14:02:26 [name] ([id number]) -------------------------------------------------------------------------------- Lower Extremity Assessment Details Patient Name: [name] Date of Service: 05/08/2021 12:45 PM Medical Record Number: [id number] Patient Account Number: [id number] Date of Birth/Sex: [birth year] (87 y.o. F) Treating RN: Dolan Amen Primary Care Maudine Kluesner: [name] Other Clinician: Referring Anastasia Tompson: Referral, Self Treating Brice Potteiger/Extender: Skipper Cliche in Treatment: 0 Edema  Assessment Assessed: [Left: No] [Right: Yes] [Left: Edema] [Right: :] Calf Left: Right: Point of Measurement: 31 cm From Medial Instep 28.5 cm Ankle Left: Right: Point of Measurement: 13 cm From Medial Instep 19 cm Knee To Floor Left: Right: From Medial Instep 41 cm Vascular Assessment Pulses: Dorsalis Pedis Palpable: [Right:Yes] Blood Pressure: Brachial: [id number] Ankle: [Right:Posterior Tibial: 176 1.66] Electronic Signature(s) Signed: 05/08/2021 4:46:45 PM By: Dolan Amen RN Entered By: Dolan Amen on 05/08/2021 13:33:25 Helmers, Karle [name] ([id number]) -------------------------------------------------------------------------------- Multi Wound Chart Details Patient Name: [name] Date of Service: 05/08/2021 12:45 PM Medical Record Number: [id number] Patient Account Number: [id number] Date of Birth/Sex: 1932-04-22 (87 y.o. F) Treating RN: Dolan Amen Primary Care Lazarius Rivkin: [name] Other Clinician: Referring Mackson Botz: Referral, Self Treating Mehar Sagen/Extender: Skipper Cliche in Treatment: 0 Vital Signs Height(in): Pulse(bpm): 105 Weight(lbs): 131 Blood Pressure(mmHg): 133/81 Body Mass Index(BMI): Temperature(F): 98.6 Respiratory Rate(breaths/min): 18 Photos: [N/A:N/A] Wound Location: Right, Anterior Lower Leg N/A N/A Wounding Event: Hematoma N/A N/A Primary Etiology: Venous Leg Ulcer N/A N/A Comorbid History: Cataracts, Arrhythmia, N/A N/A Osteoarthritis, Neuropathy Date Acquired: 04/13/2021 N/A N/A Weeks of Treatment: 0 N/A N/A Wound Status: Open N/A N/A Measurements L x W x D (cm) 1.6x1.5x0.1 N/A N/A Area (cm) : 1.885 N/A N/A Volume (cm) : 0.188 N/A N/A Classification: Full Thickness Without Exposed N/A N/A Support Structures Exudate Amount: Medium N/A N/A Exudate Type: Serosanguineous N/A N/A Exudate Color: red, brown N/A N/A Wound Margin: Flat and Intact N/A N/A Granulation Amount: Large (67-100%) N/A N/A Granulation Quality:  Red N/A N/A Necrotic Amount: None Present (0%) N/A N/A Exposed Structures: Fat Layer (Subcutaneous Tissue): N/A N/A Yes Fascia: No Tendon: No Muscle: No Joint: No Bone: No Epithelialization: None N/A N/A Treatment Notes Electronic Signature(s) Signed: 05/08/2021 4:46:45 PM By: Dolan Amen RN Entered By: Dolan Amen on 05/08/2021 13:57:55 [place], Karle [name] ([id number]) -------------------------------------------------------------------------------- Multi-Disciplinary Care Plan Details Patient Name: [name] Date of Service: 05/08/2021 12:45 PM Medical Record  Number: [id number] Patient Account Number: [id number] Date of Birth/Sex: 1932-08-24 (87 y.o. F) Treating RN: Dolan Amen Primary Care Demarius Archila: [name] Other Clinician: Referring Navia Lindahl: Referral, Self Treating Sabino Denning/Extender: Skipper Cliche in Treatment: 0 Active Inactive Orientation to the Wound Care Program Nursing Diagnoses: Knowledge deficit related to the wound healing center program Goals: Patient/caregiver will verbalize understanding of the [facility] Program Date Initiated: 05/08/2021 Target Resolution Date: 05/08/2021 Goal Status: Active Interventions: Provide education on orientation to the wound center Notes: Wound/Skin Impairment Nursing Diagnoses: Impaired tissue integrity Goals: Patient/caregiver will verbalize understanding of skin care regimen Date Initiated: 05/08/2021 Target Resolution Date: 05/08/2021 Goal Status: Active Ulcer/skin breakdown will have a volume reduction of 30% by week 4 Date Initiated: 05/08/2021 Target Resolution Date: 06/08/2021 Goal Status: Active Ulcer/skin breakdown will have a volume reduction of 50% by week 8 Date Initiated: 05/08/2021 Target Resolution Date: 07/08/2021 Goal Status: Active Ulcer/skin breakdown will have a volume reduction of 80% by week 12 Date Initiated: 05/08/2021 Target Resolution Date: 08/08/2021 Goal Status:  Active Ulcer/skin breakdown will heal within 14 weeks Date Initiated: 05/08/2021 Target Resolution Date: 09/07/2021 Goal Status: Active Interventions: Assess patient/caregiver ability to obtain necessary supplies Assess patient/caregiver ability to perform ulcer/skin care regimen upon admission and as needed Assess ulceration(s) every visit Provide education on ulcer and skin care Treatment Activities: Skin care regimen initiated : 05/08/2021 Notes: Electronic Signature(s) Signed: 05/08/2021 4:46:45 PM By: Dolan Amen RN Entered By: Dolan Amen on 05/08/2021 13:57:42 Furnari, Karle [name] ([id number]) -------------------------------------------------------------------------------- Pain Assessment Details Patient Name: [name] Date of Service: 05/08/2021 12:45 PM Medical Record Number: [id number] Patient Account Number: [id number] Date of Birth/Sex: 05-03-1932 (87 y.o. F) Treating RN: Dolan Amen Primary Care Kadi Hession: [name] Other Clinician: Referring Tessa Seaberry: Referral, Self Treating Neville Walston/Extender: Skipper Cliche in Treatment: 0 Active Problems Location of Pain Severity and Description of Pain Patient Has Paino No Site Locations Rate the pain. Current Pain Level: 0 Pain Management and Medication Current Pain Management: Electronic Signature(s) Signed: 05/08/2021 4:46:45 PM By: Dolan Amen RN Entered By: Dolan Amen on 05/08/2021 13:05:28 Geidel, Karle [name] ([id number]) -------------------------------------------------------------------------------- Patient/Caregiver Education Details Patient Name: [name] Date of Service: 05/08/2021 12:45 PM Medical Record Number: [id number] Patient Account Number: [id number] Date of Birth/Gender: April 05, 1932 (87 y.o. F) Treating RN: Dolan Amen Primary Care Physician: [name] Other Clinician: Referring Physician: Referral, Self Treating Physician/Extender: Skipper Cliche in Treatment: 0 Education  Assessment Education Provided To: Patient Education Topics Provided Welcome To [facility]: Methods: Explain/Verbal Responses: State content correctly Wound/Skin Impairment: Methods: Explain/Verbal Responses: State content correctly Electronic Signature(s) Signed: 05/08/2021 4:46:45 PM By: Dolan Amen RN Entered By: Dolan Amen on 05/08/2021 14:02:42 Herbel, Karle [name] ([id number]) -------------------------------------------------------------------------------- Wound Assessment Details Patient Name: [name] Date of Service: 05/08/2021 12:45 PM Medical Record Number: [id number] Patient Account Number: [id number] Date of Birth/Sex: May 24, 1932 (87 y.o. F) Treating RN: Dolan Amen Primary Care Deshun Sedivy: [name] Other Clinician: Referring Soloman Mckeithan: Referral, Self Treating Rodina Pinales/Extender: Skipper Cliche in Treatment: 0 Wound Status Wound Number: 1 Primary Etiology: Venous Leg Ulcer Wound Location: Right, Anterior Lower Leg Wound Status: Open Wounding Event: Hematoma Comorbid History: Cataracts, Arrhythmia, Osteoarthritis, Neuropathy Date Acquired: 04/13/2021 Weeks Of Treatment: 0 Clustered Wound: No Photos Wound Measurements Length: (cm) 1.6 Width: (cm) 1.5 Depth: (cm) 0.1 Area: (cm) 1.885 Volume: (cm) 0.188 % Reduction in Area: % Reduction in Volume: Epithelialization: None Tunneling: No Undermining: No Wound Description Classification: Full Thickness  Without Exposed Support Structures Wound Margin: Flat and Intact Exudate Amount: Medium Exudate Type: Serosanguineous Exudate Color: red, brown Foul Odor After Cleansing: No Slough/Fibrino No Wound Bed Granulation Amount: Large (67-100%) Exposed Structure Granulation Quality: Red Fascia Exposed: No Necrotic Amount: None Present (0%) Fat Layer (Subcutaneous Tissue) Exposed: Yes Tendon Exposed: No Muscle Exposed: No Joint Exposed: No Bone Exposed: No Electronic  Signature(s) Signed: 05/08/2021 4:46:45 PM By: Dolan Amen RN Entered By: Dolan Amen on 05/08/2021 13:17:05 Supan, Karle [name] ([id number]) -------------------------------------------------------------------------------- [place] Details Patient Name: [name] Date of Service: 05/08/2021 12:45 PM Medical Record Number: [id number] Patient Account Number: [id number] Date of Birth/Sex: Nov 10, 1931 (87 y.o. F) Treating RN: Dolan Amen Primary Care Kamdyn Colborn: [name] Other Clinician: Referring Jefferson Fullam: Referral, Self Treating Hayden Kihara/Extender: Skipper Cliche in Treatment: 0 Vital Signs Time Taken: 13:05 Temperature (F): 98.6 Weight (lbs): 131 Pulse (bpm): 105 Source: Measured Respiratory Rate (breaths/min): 18 Blood Pressure (mmHg): 133/81 Reference Range: 80 - 120 mg / dl Electronic Signature(s) Signed: 05/08/2021 4:46:45 PM By: Dolan Amen RN Entered By: Dolan Amen on 05/08/2021 13:05:51

## 2021-05-11 DIAGNOSIS — I872 Venous insufficiency (chronic) (peripheral): Secondary | ICD-10-CM | POA: Diagnosis not present

## 2021-05-16 ENCOUNTER — Other Ambulatory Visit: Payer: Self-pay

## 2021-05-16 ENCOUNTER — Ambulatory Visit: Payer: PPO | Admitting: Cardiovascular Disease

## 2021-05-16 ENCOUNTER — Encounter: Payer: Self-pay | Admitting: Cardiovascular Disease

## 2021-05-16 VITALS — BP 120/62 | HR 88 | Ht 68.0 in | Wt 132.1 lb

## 2021-05-16 DIAGNOSIS — E782 Mixed hyperlipidemia: Secondary | ICD-10-CM | POA: Diagnosis not present

## 2021-05-16 DIAGNOSIS — J432 Centrilobular emphysema: Secondary | ICD-10-CM

## 2021-05-16 DIAGNOSIS — I872 Venous insufficiency (chronic) (peripheral): Secondary | ICD-10-CM | POA: Diagnosis not present

## 2021-05-16 DIAGNOSIS — I482 Chronic atrial fibrillation, unspecified: Secondary | ICD-10-CM

## 2021-05-16 NOTE — Progress Notes (Signed)
Cardiology Office Note  Date:  05/16/2021   ID:  [name], DOB October 15, 1931, MRN [id number]  PCP:  Jinny Sanders, MD   Chief Complaint  Patient presents with   12 month follow up     "Doing well." Medications reviewed by the patient verbally.     HPI:  [name] is a 87 year old woman with  chronic atrial fibrillation, on warfarin COPD,   40 years of smoking, quit 20 years ago hyperlipidemia Chronic leg pain, weakness She lives at [facility] [place]. Chronic leg swelling who presents for routine followup of her atrial fibrillation.   Follow-up today reports she has been doing relatively well Continues to battle a wound on the leg, has not wrapped Reports symptoms stem from venous insufficiency  Followed by pulmonary/[facility] Has COPD Denies any COPD exacerbations, reports breathing is stable  No recent falls No significant leg edema  No dizziness, no orthostasis, no chest pain active at [facility] reviewed INR 1.9   EKG personally reviewed by myself on todays visit Shows atrial fibrillation rate 88 bpm poor R wave progression through the anterior precordial leads, left axis deviation  Other past medical history reviewed chronic vertigo, balance issues, legs weak Previously seen by [name], Dr. Pryor Ochoa for vertigo  Prior cardiac notes from Dr. Sheppard Coil in [place] indicate she has chronic atrial fibrillation,  echocardiogram in 2010 and March 2014 confirming atrial fibrillation. No old EKGs are available.   Echocardiogram from March 2014 shows normal LV systolic function, severely dilated left and right atrium, mildly elevated right ventricular systolic pressure estimated at 35 mm of mercury  PMH:   has a past medical history of Atrial fibrillation ([place]), COPD (chronic obstructive pulmonary disease) ([place]), Hypothyroidism, Microscopic colitis, Osteoporosis, Squamous cell carcinoma, and Syncope.  PSH:    Past Surgical History:  Procedure Laterality Date   APPENDECTOMY      CATARACT EXTRACTION Bilateral    COLONOSCOPY     LIPOMA EXCISION     MULTIPLE TOOTH EXTRACTIONS     REFRACTIVE SURGERY     SQUAMOUS CELL CARCINOMA EXCISION  07/10/2017   left shin    Current Outpatient Medications  Medication Sig Dispense Refill   Fluticasone-Salmeterol (ADVAIR) 100-50 MCG/DOSE AEPB INHALE 1 PUFF BY MOUTH TWICE DAILY 180 each 2   gabapentin (NEURONTIN) 100 MG capsule Take 3 capsules (300 mg total) by mouth at bedtime. 270 capsule 1   ipratropium (ATROVENT) 0.03 % nasal spray PLACE 2 SPRAYS IN EACH NOSTRIL THREE TIMES DAILY AS NEEDED PRIOR TO ACTIVITIES THATS CAUSE NOSE TO RUN 30 mL 5   levothyroxine (SYNTHROID) 75 MCG tablet TAKE 1 TABLET BY MOUTH 30 MINUTES BEFORE EATING WITH A FULL GLASS OF WATER 90 tablet 3   verapamil (CALAN-SR) 120 MG CR tablet TAKE 1 TABLET(120 MG) BY MOUTH DAILY 90 tablet 1   warfarin (COUMADIN) 5 MG tablet TAKE ONE TABLET BY MOUTH DAILY EXCEPT TAKE 1/2 TABLET ON SUNDAYS OR AS DIRECTED BY COUMADIN CLINIC 90 tablet 1   No current facility-administered medications for this visit.     Allergies:   Sulfa antibiotics   Social History:  The patient  reports that she quit smoking about 13 years ago. Her smoking use included cigarettes. She has a 40.00 pack-year smoking history. She has never used smokeless tobacco. She reports that she does not currently use alcohol. She reports that she does not use drugs.   Family History:   family history includes Alzheimer's disease in her mother; Brain cancer  in her father; Cancer (age of onset: 78) in her brother.    Review of Systems: Review of Systems  HENT: Negative.    Respiratory: Negative.    Cardiovascular: Negative.   Gastrointestinal: Negative.   Musculoskeletal: Negative.        Poor balance, pain in her feet  Neurological: Negative.   Psychiatric/Behavioral: Negative.    All other systems reviewed and are negative.   PHYSICAL EXAM: VS:  BP 120/62 (BP Location: Left Arm, Patient Position:  Sitting, Cuff Size: Normal)   Pulse 88   Ht '5\' 8"'$  (1.727 m)   Wt 132 lb 2 oz (59.9 kg)   SpO2 96%   BMI 20.09 kg/m  , BMI Body mass index is 20.09 kg/m.  Constitutional:  oriented to person, place, and time. No distress. Thin, poor balance HENT:  Head: Grossly normal Eyes:  no discharge. No scleral icterus.  Neck: No JVD, no carotid bruits  Cardiovascular: Regular rate and rhythm, no murmurs appreciated Pulmonary/Chest: Clear to auscultation bilaterally, no wheezes or rails Abdominal: Soft.  no distension.  no tenderness.  Musculoskeletal: Normal range of motion Neurological:  normal muscle tone. Coordination normal. No atrophy Skin: Skin warm and dry Psychiatric: normal affect, pleasant   Recent Labs: 11/03/2020: ALT 24; BUN 14; Creatinine, Ser 0.79; Potassium 4.5; Sodium 136; TSH 1.42    Lipid Panel Lab Results  Component Value Date   CHOL 221 (H) 11/03/2020   HDL 85.70 11/03/2020   LDLCALC 117 (H) 11/03/2020   TRIG 92.0 11/03/2020      Wt Readings from Last 3 Encounters:  05/16/21 132 lb 2 oz (59.9 kg)  01/16/21 132 lb 8 oz (60.1 kg)  12/15/20 132 lb 8 oz (60.1 kg)     ASSESSMENT AND PLAN:  Atrial fibrillation, unspecified type ([place]) - Plan: EKG 12-Lead Rate controlled, asymptomatic, No changes to her medications Blood pressure stable  HLD (hyperlipidemia) No known CAD or PAD Does not want statin  Centrilobular emphysema (HCC) Stable, Followed by Dr. Mortimer Fries No recent exacerbation  Leg pain/neuropathy Followed by podiatry and neurology  Foot drop. Hammertoes  On Neurontin Recommended exercise program for gait stability Has significant gait instability   Total encounter time more than 25 minutes  Greater than 50% was spent in counseling and coordination of care with the patient    Orders Placed This Encounter  Procedures   EKG 12-Lead     Signed, Esmond Plants, M.D., Ph.D. 05/16/2021  [place],  [place]

## 2021-05-16 NOTE — Patient Instructions (Addendum)
Medication Instructions:  No changes  If you need a refill on your cardiac medications before your next appointment, please call your pharmacy.   Lab work: No new labs needed  Testing/Procedures: No new testing needed  Follow-Up: At [facility], you and your health needs are our priority.  As part of our continuing mission to provide you with exceptional heart care, we have created designated Provider Care Teams.  These Care Teams include your primary Cardiologist (physician) and Advanced Practice Providers (APPs -  Physician Assistants and Nurse Practitioners) who all work together to provide you with the care you need, when you need it.  You will need a follow up appointment in 12 months  Providers on your designated Care Team:   Christopher Berge, NP Ryan Dunn, PA-C Jacquelyn Visser, PA-C Cadence Furth, PA-C  COVID-19 Vaccine Information can be found at: [email or web address] For questions related to vaccine distribution or appointments, please email [email or web address] or call [phone].    

## 2021-05-22 ENCOUNTER — Other Ambulatory Visit: Payer: Self-pay | Admitting: Family Medicine

## 2021-05-22 NOTE — Telephone Encounter (Signed)
Last office visit 01/16/2021 for polyneuropathy and urge incontinence.  Last refilled 11/16/2020 for #270 with 1 refill.  No future appointments with PCP.

## 2021-05-24 DIAGNOSIS — D802 Selective deficiency of immunoglobulin A [IgA]: Secondary | ICD-10-CM | POA: Diagnosis not present

## 2021-05-24 DIAGNOSIS — I482 Chronic atrial fibrillation, unspecified: Secondary | ICD-10-CM | POA: Diagnosis not present

## 2021-05-24 DIAGNOSIS — Z7901 Long term (current) use of anticoagulants: Secondary | ICD-10-CM | POA: Diagnosis not present

## 2021-05-25 ENCOUNTER — Ambulatory Visit (INDEPENDENT_AMBULATORY_CARE_PROVIDER_SITE_OTHER): Payer: PPO

## 2021-05-25 DIAGNOSIS — Z7901 Long term (current) use of anticoagulants: Secondary | ICD-10-CM | POA: Diagnosis not present

## 2021-05-25 LAB — POCT INR: INR: 2 (ref 2.0–3.0)

## 2021-05-25 NOTE — Patient Instructions (Addendum)
Pre visit review using our clinic review tool, if applicable. No additional management support is needed unless otherwise documented below in the visit note.  Continue 5 mg everyday EXCEPT take 2.5 mg on Sundays. Re-check in 4 weeks.

## 2021-05-29 ENCOUNTER — Other Ambulatory Visit: Payer: Self-pay

## 2021-05-29 ENCOUNTER — Encounter: Payer: PPO | Attending: Physician Assistant | Admitting: Physician Assistant

## 2021-05-29 DIAGNOSIS — Z7901 Long term (current) use of anticoagulants: Secondary | ICD-10-CM | POA: Insufficient documentation

## 2021-05-29 DIAGNOSIS — I872 Venous insufficiency (chronic) (peripheral): Secondary | ICD-10-CM | POA: Diagnosis not present

## 2021-05-29 DIAGNOSIS — I70238 Atherosclerosis of native arteries of right leg with ulceration of other part of lower right leg: Secondary | ICD-10-CM | POA: Insufficient documentation

## 2021-05-29 DIAGNOSIS — I480 Paroxysmal atrial fibrillation: Secondary | ICD-10-CM | POA: Diagnosis not present

## 2021-05-29 DIAGNOSIS — L97812 Non-pressure chronic ulcer of other part of right lower leg with fat layer exposed: Secondary | ICD-10-CM | POA: Diagnosis not present

## 2021-05-29 NOTE — Progress Notes (Addendum)
Mackenzie, [name] ([id number]) Visit Report for 05/29/2021 Chief Complaint Document Details Patient Name: [name], [name] Date of Service: 05/29/2021 10:45 AM Medical Record Number: [id number] Patient Account Number: [id number] Date of Birth/Sex: 05/20/1932 (87 y.o. F) Treating RN: Dolan Amen Primary Care Provider: [name] Other Clinician: Referring Provider: [name] Treating Provider/Extender: Skipper Cliche in Treatment: 3 Information Obtained from: Patient Chief Complaint Right LE Ulcer Electronic Signature(s) Signed: 05/29/2021 11:23:52 AM By: Worthy Keeler PA-C Entered By: Worthy Keeler on 05/29/2021 11:23:52 Garver, [name] ([id number]) -------------------------------------------------------------------------------- HPI Details Patient Name: [name] Date of Service: 05/29/2021 10:45 AM Medical Record Number: [id number] Patient Account Number: [id number] Date of Birth/Sex: [birth year] (87 y.o. F) Treating RN: Dolan Amen Primary Care Provider: [name] Other Clinician: Referring Provider: [name] Treating Provider/Extender: Skipper Cliche in Treatment: 3 History of Present Illness HPI Description: 05/08/2021 upon evaluation today patient appears for initial inspection here in the clinic concerning issues that she has been having actually with a wound over the anterior portion of her right leg. She tells me that this was due to an injury where she struck this on something causing the initial trauma and she has been having trouble getting it healed. [name] over at [facility] has been helping take care of this. With that being said because it was not healing quite as quickly and effectively is what she wanted to see she did want [place] to take a look and make any recommendations to try to help things along. I am definitely happy to do that for sure. Fortunately there does not appear to be any signs of active infection at this time which is great news. The  patient also does not seem to have too deep of the wound which is also good news. Her biggest concern is she really does not want to come weekly to the clinic especially since she has care at the [facility] location as far as getting the dressing changed and keeping an eye on it. She does have a history of atrial fibrillation for which she is on warfarin and she does not like to do water aerobics twice a week although she is not able to right now due to the fact that she has this wound and cannot get in the water. That is a significant issue for her at this point. Otherwise does have noncompressible pulses based on what we are seeing today. She wears compression socks for her chronic venous insufficiency she has had previous ablation therapy. 05/29/2021 upon evaluation today patient appears to be doing well with regard to her wound. Overall this is actually showing signs of good improvement with significant overall decrease in size since last week even. Overall I feel like that she is doing awesome and making excellent progress. Over at [facility] they been taking good care of her. Electronic Signature(s) Signed: 05/29/2021 9:14:16 PM By: Worthy Keeler PA-C Entered By: Worthy Keeler on 05/29/2021 21:14:16 Stonehocker, [name] ([id number]) -------------------------------------------------------------------------------- Physical Exam Details Patient Name: [name] Date of Service: 05/29/2021 10:45 AM Medical Record Number: [id number] Patient Account Number: [id number] Date of Birth/Sex: 1931/12/24 (87 y.o. F) Treating RN: Dolan Amen Primary Care Provider: [name] Other Clinician: Referring Provider: [name] Treating Provider/Extender: Skipper Cliche in Treatment: 3 Constitutional Well-nourished and well-hydrated in no acute distress. Respiratory normal breathing without difficulty. Psychiatric this patient is able to make decisions and demonstrates good insight into  disease process. Alert and Oriented x  3. pleasant and cooperative. Notes Upon evaluation patient's wound bed showed signs of good granulation epithelization at this point. Fortunately there does not appear to be any signs of active infection which is great and overall I am extremely pleased with where things stand today. No fevers, chills, nausea, vomiting, or diarrhea. Electronic Signature(s) Signed: 05/29/2021 9:14:37 PM By: Worthy Keeler PA-C Entered By: Worthy Keeler on 05/29/2021 21:14:36 Faxon, [name] ([id number]) -------------------------------------------------------------------------------- Physician Orders Details Patient Name: [name] Date of Service: 05/29/2021 10:45 AM Medical Record Number: [id number] Patient Account Number: [id number] Date of Birth/Sex: 07-22-1932 (87 y.o. F) Treating RN: Dolan Amen Primary Care Provider: [name] Other Clinician: Referring Provider: [name] Treating Provider/Extender: Skipper Cliche in Treatment: 3 Verbal / Phone Orders: No Diagnosis Coding ICD-10 Coding Code Description I87.2 Venous insufficiency (chronic) (peripheral) L97.812 Non-pressure chronic ulcer of other part of right lower leg with fat layer exposed I48.0 Paroxysmal atrial fibrillation Z79.01 Long term (current) use of anticoagulants I73.89 Other specified peripheral vascular diseases Follow-up Appointments o Return Appointment in 2 weeks. Bathing/ Shower/ Hygiene o May shower; gently cleanse wound with antibacterial soap, rinse and pat dry prior to dressing wounds Edema Control - Lymphedema / Segmental Compressive Device / Other Bilateral Lower Extremities o Patient to wear own compression stockings. Remove compression stockings every night before going to bed and put on every morning when getting up. o Elevate, Exercise Daily and Avoid Standing for Long Periods of Time. o Elevate legs to the level of the heart and pump ankles as often  as possible o Elevate leg(s) parallel to the floor when sitting. Wound Treatment Wound #1 - Lower Leg Wound Laterality: Right, Anterior Cleanser: Normal Saline 3 x Per Week/30 Days Discharge Instructions: Wash your hands with soap and water. Remove old dressing, discard into plastic bag and place into trash. Cleanse the wound with Normal Saline prior to applying a clean dressing using gauze sponges, not tissues or cotton balls. Do not scrub or use excessive force. Pat dry using gauze sponges, not tissue or cotton balls. Primary Dressing: Hydrofera Blue Ready Transfer Foam, 2.5x2.5 (in/in) (Generic) 3 x Per Week/30 Days Discharge Instructions: Cut Hydrofera Blue Ready about twice the size of wound, apply to wound bed Primary Dressing: Zetuvit Plus Silicone Border Dressing 4x4 (in/in) (Generic) 3 x Per Week/30 Days Discharge Instructions: Secure hydrofera blue Electronic Signature(s) Signed: 05/29/2021 5:00:35 PM By: Dolan Amen RN Signed: 05/29/2021 9:56:09 PM By: Worthy Keeler PA-C Entered By: Dolan Amen on 05/29/2021 11:48:27 Izard, [name] ([id number]) -------------------------------------------------------------------------------- Problem List Details Patient Name: [name] Date of Service: 05/29/2021 10:45 AM Medical Record Number: [id number] Patient Account Number: [id number] Date of Birth/Sex: 1932/01/23 (87 y.o. F) Treating RN: Dolan Amen Primary Care Provider: [name] Other Clinician: Referring Provider: [name] Treating Provider/Extender: Skipper Cliche in Treatment: 3 Active Problems ICD-10 Encounter Code Description Active Date MDM Diagnosis I87.2 Venous insufficiency (chronic) (peripheral) 05/08/2021 No Yes L97.812 Non-pressure chronic ulcer of other part of right lower leg with fat layer 05/08/2021 No Yes exposed I48.0 Paroxysmal atrial fibrillation 05/08/2021 No Yes Z79.01 Long term (current) use of anticoagulants 05/08/2021 No Yes I73.89  Other specified peripheral vascular diseases 05/08/2021 No Yes Inactive Problems Resolved Problems Electronic Signature(s) Signed: 05/29/2021 11:23:46 AM By: Worthy Keeler PA-C Entered By: Worthy Keeler on 05/29/2021 11:23:46 Shells, [name] ([id number]) -------------------------------------------------------------------------------- Progress Note Details Patient Name: [name] Date of Service: 05/29/2021 10:45 AM Medical Record Number: [id number]  Patient Account Number: [id number] Date of Birth/Sex: 08-02-1932 (87 y.o. F) Treating RN: Dolan Amen Primary Care Provider: [name] Other Clinician: Referring Provider: [name] Treating Provider/Extender: Skipper Cliche in Treatment: 3 Subjective Chief Complaint Information obtained from Patient Right LE Ulcer History of Present Illness (HPI) 05/08/2021 upon evaluation today patient appears for initial inspection here in the clinic concerning issues that she has been having actually with a wound over the anterior portion of her right leg. She tells me that this was due to an injury where she struck this on something causing the initial trauma and she has been having trouble getting it healed. [name] over at [facility] has been helping take care of this. With that being said because it was not healing quite as quickly and effectively is what she wanted to see she did want [place] to take a look and make any recommendations to try to help things along. I am definitely happy to do that for sure. Fortunately there does not appear to be any signs of active infection at this time which is great news. The patient also does not seem to have too deep of the wound which is also good news. Her biggest concern is she really does not want to come weekly to the clinic especially since she has care at the [facility] location as far as getting the dressing changed and keeping an eye on it. She does have a history of atrial fibrillation for  which she is on warfarin and she does not like to do water aerobics twice a week although she is not able to right now due to the fact that she has this wound and cannot get in the water. That is a significant issue for her at this point. Otherwise does have noncompressible pulses based on what we are seeing today. She wears compression socks for her chronic venous insufficiency she has had previous ablation therapy. 05/29/2021 upon evaluation today patient appears to be doing well with regard to her wound. Overall this is actually showing signs of good improvement with significant overall decrease in size since last week even. Overall I feel like that she is doing awesome and making excellent progress. Over at [facility] they been taking good care of her. Objective Constitutional Well-nourished and well-hydrated in no acute distress. Vitals Time Taken: 11:26 AM, Weight: 131 lbs, Temperature: 98.3 F, Pulse: 82 bpm, Respiratory Rate: 18 breaths/min, Blood Pressure: 126/87 mmHg. Respiratory normal breathing without difficulty. Psychiatric this patient is able to make decisions and demonstrates good insight into disease process. Alert and Oriented x 3. pleasant and cooperative. General Notes: Upon evaluation patient's wound bed showed signs of good granulation epithelization at this point. Fortunately there does not appear to be any signs of active infection which is great and overall I am extremely pleased with where things stand today. No fevers, chills, nausea, vomiting, or diarrhea. Integumentary (Hair, Skin) Wound #1 status is Open. Original cause of wound was Hematoma. The date acquired was: 04/13/2021. The wound has been in treatment 3 weeks. The wound is located on the Right,Anterior Lower Leg. The wound measures 0.7cm length x 0.4cm width x 0.1cm depth; 0.22cm^2 area and 0.022cm^3 volume. There is Fat Layer (Subcutaneous Tissue) exposed. There is no tunneling or undermining noted. There  is a medium amount of serosanguineous drainage noted. The wound margin is flat and intact. There is large (67-100%) red granulation within the wound bed. There is no necrotic tissue within the wound bed. Christner,  [name] ([id number]) Assessment Active Problems ICD-10 Venous insufficiency (chronic) (peripheral) Non-pressure chronic ulcer of other part of right lower leg with fat layer exposed Paroxysmal atrial fibrillation Long term (current) use of anticoagulants Other specified peripheral vascular diseases Plan Follow-up Appointments: Return Appointment in 2 weeks. Bathing/ Shower/ Hygiene: May shower; gently cleanse wound with antibacterial soap, rinse and pat dry prior to dressing wounds Edema Control - Lymphedema / Segmental Compressive Device / Other: Patient to wear own compression stockings. Remove compression stockings every night before going to bed and put on every morning when getting up. Elevate, Exercise Daily and Avoid Standing for Long Periods of Time. Elevate legs to the level of the heart and pump ankles as often as possible Elevate leg(s) parallel to the floor when sitting. WOUND #1: - Lower Leg Wound Laterality: Right, Anterior Cleanser: Normal Saline 3 x Per Week/30 Days Discharge Instructions: Wash your hands with soap and water. Remove old dressing, discard into plastic bag and place into trash. Cleanse the wound with Normal Saline prior to applying a clean dressing using gauze sponges, not tissues or cotton balls. Do not scrub or use excessive force. Pat dry using gauze sponges, not tissue or cotton balls. Primary Dressing: Hydrofera Blue Ready Transfer Foam, 2.5x2.5 (in/in) (Generic) 3 x Per Week/30 Days Discharge Instructions: Cut Hydrofera Blue Ready about twice the size of wound, apply to wound bed Primary Dressing: Zetuvit Plus Silicone Border Dressing 4x4 (in/in) (Generic) 3 x Per Week/30 Days Discharge Instructions: Secure hydrofera blue 1. Would recommend  currently that we going to continue with the wound care measures as before utilizing [facility] which I think is doing an awesome job. 2. Also can recommend that we have the patient continue to elevate her legs is much as possible to help with edema control. Again I think that overall she is doing awesome and I do not see any signs of anything worsening significantly which is awesome. 3. The nurses over at [facility] will continue to help with changing her dressings I think they are doing a great job as well. We will see patient back for reevaluation in 1 week here in the clinic. If anything worsens or changes patient will contact our office for additional recommendations. Electronic Signature(s) Signed: 05/29/2021 9:19:30 PM By: Worthy Keeler PA-C Entered By: Worthy Keeler on 05/29/2021 21:19:30 Vanhouten, [name] ([id number]) -------------------------------------------------------------------------------- SuperBill Details Patient Name: [name] Date of Service: 05/29/2021 Medical Record Number: [id number] Patient Account Number: [id number] Date of Birth/Sex: 02-11-1932 (87 y.o. F) Treating RN: Dolan Amen Primary Care Provider: [name] Other Clinician: Referring Provider: [name] Treating Provider/Extender: Skipper Cliche in Treatment: 3 Diagnosis Coding ICD-10 Codes Code Description I87.2 Venous insufficiency (chronic) (peripheral) L97.812 Non-pressure chronic ulcer of other part of right lower leg with fat layer exposed I48.0 Paroxysmal atrial fibrillation Z79.01 Long term (current) use of anticoagulants I73.89 Other specified peripheral vascular diseases Facility Procedures CPT4 Code: [id number] Description: [id number] - WOUND CARE VISIT-LEV 3 EST PT Modifier: Quantity: 1 Physician Procedures CPT4 Code: [id number] Description: [id number] - WC PHYS LEVEL 3 - EST PT Modifier: Quantity: 1 CPT4 Code: Description: ICD-10 Diagnosis Description I87.2 Venous  insufficiency (chronic) (peripheral) L97.812 Non-pressure chronic ulcer of other part of right lower leg with fat la I48.0 Paroxysmal atrial fibrillation Z79.01 Long term (current) use of  anticoagulants Modifier: yer exposed Quantity: Electronic Signature(s) Signed: 05/29/2021 9:20:15 PM By: Worthy Keeler PA-C Previous Signature: 05/29/2021 5:00:35 PM Version By: Dolan Amen RN Entered  By: Worthy Keeler on 05/29/2021 21:20:15

## 2021-05-29 NOTE — Progress Notes (Addendum)
[name], [name] ([id number]) Visit Report for 05/29/2021 Arrival Information Details Patient Name: [name], [name] Date of Service: 05/29/2021 10:45 AM Medical Record Number: [id number] Patient Account Number: [id number] Date of Birth/Sex: 1931-10-28 (87 y.o. F) Treating RN: Dolan Amen Primary Care Greycen Felter: [name] Other Clinician: Referring Jalynn Waddell: [name] Treating Dalary Hollar/Extender: Skipper Cliche in Treatment: 3 Visit Information History Since Last Visit Pain Present Now: No Patient Arrived: Cane Arrival Time: 11:25 Accompanied By: self Transfer Assistance: None Patient Identification Verified: Yes Secondary Verification Process Completed: Yes Patient Has Alerts: Yes Patient Alerts: Patient on Blood Thinner Electronic Signature(s) Signed: 05/29/2021 5:00:35 PM By: Dolan Amen RN Entered By: Dolan Amen on 05/29/2021 11:25:33 [name], [name] ([id number]) -------------------------------------------------------------------------------- Clinic Level of Care Assessment Details Patient Name: [name] Date of Service: 05/29/2021 10:45 AM Medical Record Number: [id number] Patient Account Number: [id number] Date of Birth/Sex: Feb 24, 1932 (87 y.o. F) Treating RN: Dolan Amen Primary Care Jashae Wiggs: [name] Other Clinician: Referring Mammie Meras: [name] Treating Murice Barbar/Extender: Skipper Cliche in Treatment: 3 Clinic Level of Care Assessment Items TOOL 4 Quantity Score X - Use when only an EandM is performed on FOLLOW-UP visit 1 0 ASSESSMENTS - Nursing Assessment / Reassessment X - Reassessment of Co-morbidities (includes updates in patient status) 1 10 X- 1 5 Reassessment of Adherence to Treatment Plan ASSESSMENTS - Wound and Skin Assessment / Reassessment X - Simple Wound Assessment / Reassessment - one wound 1 5 '[]'$  - 0 Complex Wound Assessment / Reassessment - multiple wounds '[]'$  - 0 Dermatologic / Skin Assessment (not related to wound  area) ASSESSMENTS - Focused Assessment '[]'$  - Circumferential Edema Measurements - multi extremities 0 '[]'$  - 0 Nutritional Assessment / Counseling / Intervention '[]'$  - 0 Lower Extremity Assessment (monofilament, tuning fork, pulses) '[]'$  - 0 Peripheral Arterial Disease Assessment (using hand held doppler) ASSESSMENTS - Ostomy and/or Continence Assessment and Care '[]'$  - Incontinence Assessment and Management 0 '[]'$  - 0 Ostomy Care Assessment and Management (repouching, etc.) PROCESS - Coordination of Care X - Simple Patient / Family Education for ongoing care 1 15 '[]'$  - 0 Complex (extensive) Patient / Family Education for ongoing care '[]'$  - 0 Staff obtains Programmer, systems, Records, Test Results / Process Orders '[]'$  - 0 Staff telephones HHA, Nursing Homes / Clarify orders / etc '[]'$  - 0 Routine Transfer to another Facility (non-emergent condition) '[]'$  - 0 Routine Hospital Admission (non-emergent condition) '[]'$  - 0 New Admissions / Biomedical engineer / Ordering NPWT, Apligraf, etc. '[]'$  - 0 Emergency Hospital Admission (emergent condition) X- 1 10 Simple Discharge Coordination '[]'$  - 0 Complex (extensive) Discharge Coordination PROCESS - Special Needs '[]'$  - Pediatric / Minor Patient Management 0 '[]'$  - 0 Isolation Patient Management '[]'$  - 0 Hearing / Language / Visual special needs '[]'$  - 0 Assessment of Community assistance (transportation, D/C planning, etc.) '[]'$  - 0 Additional assistance / Altered mentation '[]'$  - 0 Support Surface(s) Assessment (bed, cushion, seat, etc.) INTERVENTIONS - Wound Cleansing / Measurement [name], [name] ([id number]) X- 1 5 Simple Wound Cleansing - one wound '[]'$  - 0 Complex Wound Cleansing - multiple wounds X- 1 5 Wound Imaging (photographs - any number of wounds) '[]'$  - 0 Wound Tracing (instead of photographs) X- 1 5 Simple Wound Measurement - one wound '[]'$  - 0 Complex Wound Measurement - multiple wounds INTERVENTIONS - Wound Dressings '[]'$  - Small Wound Dressing  one or multiple wounds 0 X- 1 15 Medium Wound Dressing one or multiple wounds '[]'$  - 0 Large  Wound Dressing one or multiple wounds '[]'$  - 0 Application of Medications - topical '[]'$  - 0 Application of Medications - injection INTERVENTIONS - Miscellaneous '[]'$  - External ear exam 0 '[]'$  - 0 Specimen Collection (cultures, biopsies, blood, body fluids, etc.) '[]'$  - 0 Specimen(s) / Culture(s) sent or taken to Lab for analysis '[]'$  - 0 Patient Transfer (multiple staff / Harrel Lemon Lift / Similar devices) '[]'$  - 0 Simple Staple / Suture removal (25 or less) '[]'$  - 0 Complex Staple / Suture removal (26 or more) '[]'$  - 0 Hypo / Hyperglycemic Management (close monitor of Blood Glucose) '[]'$  - 0 Ankle / Brachial Index (ABI) - do not check if billed separately X- 1 5 Vital Signs Has the patient been seen at the hospital within the last three years: Yes Total Score: 80 Level Of Care: New/Established - Level 3 Electronic Signature(s) Signed: 05/29/2021 5:00:35 PM By: Dolan Amen RN Entered By: Dolan Amen on 05/29/2021 11:48:59 [name], [name] ([id number]) -------------------------------------------------------------------------------- Lower Extremity Assessment Details Patient Name: [name] Date of Service: 05/29/2021 10:45 AM Medical Record Number: [id number] Patient Account Number: [id number] Date of Birth/Sex: 26-May-1932 (87 y.o. F) Treating RN: Dolan Amen Primary Care Dalissa Lovin: [name] Other Clinician: Referring Boniface Goffe: [name] Treating Martine Trageser/Extender: Jeri Cos Weeks in Treatment: 3 Electronic Signature(s) Signed: 05/29/2021 5:00:35 PM By: Dolan Amen RN Entered By: Dolan Amen on 05/29/2021 11:35:16 [name], [name] ([id number]) -------------------------------------------------------------------------------- Multi Wound Chart Details Patient Name: [name] Date of Service: 05/29/2021 10:45 AM Medical Record Number: [id number] Patient Account Number:  [id number] Date of Birth/Sex: Oct 25, 1931 (87 y.o. F) Treating RN: Dolan Amen Primary Care Yuki Brunsman: [name] Other Clinician: Referring Daymien Goth: [name] Treating Ameerah Huffstetler/Extender: Skipper Cliche in Treatment: 3 Vital Signs Height(in): Pulse(bpm): 60 Weight(lbs): 131 Blood Pressure(mmHg): 126/87 Body Mass Index(BMI): Temperature(F): 98.3 Respiratory Rate(breaths/min): 18 Photos: [N/A:N/A] Wound Location: Right, Anterior Lower Leg N/A N/A Wounding Event: Hematoma N/A N/A Primary Etiology: Venous Leg Ulcer N/A N/A Comorbid History: Cataracts, Arrhythmia, N/A N/A Osteoarthritis, Neuropathy Date Acquired: 04/13/2021 N/A N/A Weeks of Treatment: 3 N/A N/A Wound Status: Open N/A N/A Measurements L x W x D (cm) 0.7x0.4x0.1 N/A N/A Area (cm) : 0.22 N/A N/A Volume (cm) : 0.022 N/A N/A % Reduction in Area: 88.30% N/A N/A % Reduction in Volume: 88.30% N/A N/A Classification: Full Thickness Without Exposed N/A N/A Support Structures Exudate Amount: Medium N/A N/A Exudate Type: Serosanguineous N/A N/A Exudate Color: red, brown N/A N/A Wound Margin: Flat and Intact N/A N/A Granulation Amount: Large (67-100%) N/A N/A Granulation Quality: Red N/A N/A Necrotic Amount: None Present (0%) N/A N/A Exposed Structures: Fat Layer (Subcutaneous Tissue): N/A N/A Yes Fascia: No Tendon: No Muscle: No Joint: No Bone: No Epithelialization: Medium (34-66%) N/A N/A Treatment Notes Electronic Signature(s) Signed: 05/29/2021 5:00:35 PM By: Dolan Amen RN Entered By: Dolan Amen on 05/29/2021 11:36:42 [name], [name] ([id number]) -------------------------------------------------------------------------------- Multi-Disciplinary Care Plan Details Patient Name: [name] Date of Service: 05/29/2021 10:45 AM Medical Record Number: [id number] Patient Account Number: [id number] Date of Birth/Sex: August 03, 1932 (87 y.o. F) Treating RN: Dolan Amen Primary Care Deyona Soza:  [name] Other Clinician: Referring Karuna Balducci: [name] Treating Amea Mcphail/Extender: Skipper Cliche in Treatment: 3 Active Inactive Wound/Skin Impairment Nursing Diagnoses: Impaired tissue integrity Goals: Patient/caregiver will verbalize understanding of skin care regimen Date Initiated: 05/08/2021 Target Resolution Date: 05/08/2021 Goal Status: Active Ulcer/skin breakdown will have a volume reduction of 30% by week 4 Date Initiated: 05/08/2021 Target Resolution  Date: 06/08/2021 Goal Status: Active Ulcer/skin breakdown will have a volume reduction of 50% by week 8 Date Initiated: 05/08/2021 Target Resolution Date: 07/08/2021 Goal Status: Active Ulcer/skin breakdown will have a volume reduction of 80% by week 12 Date Initiated: 05/08/2021 Target Resolution Date: 08/08/2021 Goal Status: Active Ulcer/skin breakdown will heal within 14 weeks Date Initiated: 05/08/2021 Target Resolution Date: 09/07/2021 Goal Status: Active Interventions: Assess patient/caregiver ability to obtain necessary supplies Assess patient/caregiver ability to perform ulcer/skin care regimen upon admission and as needed Assess ulceration(s) every visit Provide education on ulcer and skin care Treatment Activities: Skin care regimen initiated : 05/08/2021 Notes: Electronic Signature(s) Signed: 05/29/2021 5:00:35 PM By: Dolan Amen RN Entered By: Dolan Amen on 05/29/2021 11:36:33 [name], [name] ([id number]) -------------------------------------------------------------------------------- Pain Assessment Details Patient Name: [name] Date of Service: 05/29/2021 10:45 AM Medical Record Number: [id number] Patient Account Number: [id number] Date of Birth/Sex: 1931-12-02 (87 y.o. F) Treating RN: Dolan Amen Primary Care Davinia Riccardi: [name] Other Clinician: Referring Kailene Steinhart: [name] Treating Tywan Siever/Extender: Skipper Cliche in Treatment: 3 Active Problems Location of Pain  Severity and Description of Pain Patient Has Paino No Site Locations Rate the pain. Current Pain Level: 0 Pain Management and Medication Current Pain Management: Electronic Signature(s) Signed: 05/29/2021 5:00:35 PM By: Dolan Amen RN Entered By: Dolan Amen on 05/29/2021 11:27:20 [name], [name] ([id number]) -------------------------------------------------------------------------------- Patient/Caregiver Education Details Patient Name: [name] Date of Service: 05/29/2021 10:45 AM Medical Record Number: [id number] Patient Account Number: [id number] Date of Birth/Gender: 1932/03/24 (87 y.o. F) Treating RN: Dolan Amen Primary Care Physician: [name] Other Clinician: Referring Physician: [name] Treating Physician/Extender: Skipper Cliche in Treatment: 3 Education Assessment Education Provided To: Patient Education Topics Provided Wound/Skin Impairment: Methods: Explain/Verbal Responses: State content correctly Electronic Signature(s) Signed: 05/29/2021 5:00:35 PM By: Dolan Amen RN Entered By: Dolan Amen on 05/29/2021 11:49:11 [name], [name] ([id number]) -------------------------------------------------------------------------------- Wound Assessment Details Patient Name: [name] Date of Service: 05/29/2021 10:45 AM Medical Record Number: [id number] Patient Account Number: [id number] Date of Birth/Sex: August 26, 1932 (87 y.o. F) Treating RN: Dolan Amen Primary Care Abishai Viegas: [name] Other Clinician: Referring Teresita Fanton: [name] Treating Ladarious Kresse/Extender: Jeri Cos Weeks in Treatment: 3 Wound Status Wound Number: 1 Primary Etiology: Venous Leg Ulcer Wound Location: Right, Anterior Lower Leg Wound Status: Open Wounding Event: Hematoma Comorbid History: Cataracts, Arrhythmia, Osteoarthritis, Neuropathy Date Acquired: 04/13/2021 Weeks Of Treatment: 3 Clustered Wound: No Photos Wound Measurements Length: (cm)  0.7 Width: (cm) 0.4 Depth: (cm) 0.1 Area: (cm) 0.22 Volume: (cm) 0.022 % Reduction in Area: 88.3% % Reduction in Volume: 88.3% Epithelialization: Medium (34-66%) Tunneling: No Undermining: No Wound Description Classification: Full Thickness Without Exposed Support Structu Wound Margin: Flat and Intact Exudate Amount: Medium Exudate Type: Serosanguineous Exudate Color: red, brown res Foul Odor After Cleansing: No Slough/Fibrino No Wound Bed Granulation Amount: Large (67-100%) Exposed Structure Granulation Quality: Red Fascia Exposed: No Necrotic Amount: None Present (0%) Fat Layer (Subcutaneous Tissue) Exposed: Yes Tendon Exposed: No Muscle Exposed: No Joint Exposed: No Bone Exposed: No Electronic Signature(s) Signed: 05/29/2021 5:00:35 PM By: Dolan Amen RN Entered By: Dolan Amen on 05/29/2021 11:33:57 [name], [name] ([id number]) -------------------------------------------------------------------------------- Vitals Details Patient Name: [name] Date of Service: 05/29/2021 10:45 AM Medical Record Number: [id number] Patient Account Number: [id number] Date of Birth/Sex: Nov 20, 1931 (87 y.o. F) Treating RN: Dolan Amen Primary Care Moses Odoherty: [name] Other Clinician: Referring Jance Siek: [name] Treating Arnoldo Hildreth/Extender: Skipper Cliche in Treatment:  3 Vital Signs Time Taken: 11:26 Temperature (F): 98.3 Weight (lbs): 131 Pulse (bpm): 82 Respiratory Rate (breaths/min): 18 Blood Pressure (mmHg): 126/87 Reference Range: 80 - 120 mg / dl Electronic Signature(s) Signed: 05/29/2021 5:00:35 PM By: Dolan Amen RN Entered By: Dolan Amen on 05/29/2021 11:27:14

## 2021-06-12 ENCOUNTER — Ambulatory Visit: Payer: PPO | Admitting: Physician Assistant

## 2021-06-14 ENCOUNTER — Ambulatory Visit: Payer: PPO | Admitting: Podiatry

## 2021-06-14 ENCOUNTER — Other Ambulatory Visit: Payer: Self-pay

## 2021-06-14 ENCOUNTER — Encounter: Payer: Self-pay | Admitting: Podiatry

## 2021-06-14 ENCOUNTER — Encounter: Payer: PPO | Admitting: Physician Assistant

## 2021-06-14 DIAGNOSIS — M79674 Pain in right toe(s): Secondary | ICD-10-CM

## 2021-06-14 DIAGNOSIS — M2010 Hallux valgus (acquired), unspecified foot: Secondary | ICD-10-CM

## 2021-06-14 DIAGNOSIS — B351 Tinea unguium: Secondary | ICD-10-CM

## 2021-06-14 DIAGNOSIS — M79675 Pain in left toe(s): Secondary | ICD-10-CM | POA: Diagnosis not present

## 2021-06-14 DIAGNOSIS — I70238 Atherosclerosis of native arteries of right leg with ulceration of other part of lower right leg: Secondary | ICD-10-CM | POA: Diagnosis not present

## 2021-06-14 DIAGNOSIS — L97812 Non-pressure chronic ulcer of other part of right lower leg with fat layer exposed: Secondary | ICD-10-CM | POA: Diagnosis not present

## 2021-06-14 DIAGNOSIS — M2041 Other hammer toe(s) (acquired), right foot: Secondary | ICD-10-CM | POA: Diagnosis not present

## 2021-06-14 DIAGNOSIS — M2042 Other hammer toe(s) (acquired), left foot: Secondary | ICD-10-CM

## 2021-06-14 DIAGNOSIS — D689 Coagulation defect, unspecified: Secondary | ICD-10-CM | POA: Diagnosis not present

## 2021-06-14 NOTE — Progress Notes (Addendum)
[name] ([id number]) Visit Report for 06/14/2021 Arrival Information Details Patient Name: [name], [name] Date of Service: 06/14/2021 2:00 PM Medical Record Number: [id number] Patient Account Number: [id number] Date of Birth/Sex: 1932-03-22 (87 y.o. F) Treating RN: [name] Primary Care Joelly Bolanos: [name] Other Clinician: Referring Jewelz Ricklefs: [name] Treating [name]/Extender: [name] in Treatment: 5 Visit Information History Since Last Visit Pain Present Now: No Patient Arrived: Cane Arrival Time: 14:16 Accompanied By: self Transfer Assistance: None Patient Identification Verified: Yes Secondary Verification Process Completed: Yes Patient Has Alerts: Yes Patient Alerts: Patient on Blood Thinner Electronic Signature(s) Signed: 06/14/2021 4:16:02 PM By: Mackenzie Amen RN Entered By: [name] on 06/14/2021 14:16:31 Dubow, [name] ([id number]) -------------------------------------------------------------------------------- Clinic Level of Care Assessment Details Patient Name: [name] Date of Service: 06/14/2021 2:00 PM Medical Record Number: [id number] Patient Account Number: [id number] Date of Birth/Sex: November 19, 1931 (87 y.o. F) Treating RN: [name] Primary Care Olawale Marney: [name] Other Clinician: Referring Cacey Willow: [name] Treating Aashvi Rezabek/Extender: [name] in Treatment: 5 Clinic Level of Care Assessment Items TOOL 4 Quantity Score X - Use when only an EandM is performed on FOLLOW-UP visit 1 0 ASSESSMENTS - Nursing Assessment / Reassessment X - Reassessment of Co-morbidities (includes updates in patient status) 1 10 X- 1 5 Reassessment of Adherence to Treatment Plan ASSESSMENTS - Wound and Skin Assessment / Reassessment []  - Simple Wound Assessment / Reassessment - one wound 0 []  - 0 Complex Wound Assessment / Reassessment - multiple wounds []  - 0 Dermatologic / Skin Assessment (not related to wound  area) ASSESSMENTS - Focused Assessment []  - Circumferential Edema Measurements - multi extremities 0 []  - 0 Nutritional Assessment / Counseling / Intervention []  - 0 Lower Extremity Assessment (monofilament, tuning fork, pulses) []  - 0 Peripheral Arterial Disease Assessment (using hand held doppler) ASSESSMENTS - Ostomy and/or Continence Assessment and Care []  - Incontinence Assessment and Management 0 []  - 0 Ostomy Care Assessment and Management (repouching, etc.) PROCESS - Coordination of Care X - Simple Patient / Family Education for ongoing care 1 15 []  - 0 Complex (extensive) Patient / Family Education for ongoing care []  - 0 Staff obtains Programmer, systems, Records, Test Results / Process Orders []  - 0 Staff telephones HHA, Nursing Homes / Clarify orders / etc []  - 0 Routine Transfer to another Facility (non-emergent condition) []  - 0 Routine Hospital Admission (non-emergent condition) []  - 0 New Admissions / Biomedical engineer / Ordering NPWT, Apligraf, etc. []  - 0 Emergency Hospital Admission (emergent condition) X- 1 10 Simple Discharge Coordination []  - 0 Complex (extensive) Discharge Coordination PROCESS - Special Needs []  - Pediatric / Minor Patient Management 0 []  - 0 Isolation Patient Management []  - 0 Hearing / Language / Visual special needs []  - 0 Assessment of Community assistance (transportation, D/C planning, etc.) []  - 0 Additional assistance / Altered mentation []  - 0 Support Surface(s) Assessment (bed, cushion, seat, etc.) INTERVENTIONS - Wound Cleansing / Measurement Schreurs, [name] ([id number]) []  - 0 Simple Wound Cleansing - one wound []  - 0 Complex Wound Cleansing - multiple wounds []  - 0 Wound Imaging (photographs - any number of wounds) []  - 0 Wound Tracing (instead of photographs) []  - 0 Simple Wound Measurement - one wound []  - 0 Complex Wound Measurement - multiple wounds INTERVENTIONS - Wound Dressings []  - Small Wound Dressing  one or multiple wounds 0 []  - 0 Medium Wound Dressing one or multiple wounds []  - 0 Large Wound  Dressing one or multiple wounds []  - 0 Application of Medications - topical []  - 0 Application of Medications - injection INTERVENTIONS - Miscellaneous []  - External ear exam 0 []  - 0 Specimen Collection (cultures, biopsies, blood, body fluids, etc.) []  - 0 Specimen(s) / Culture(s) sent or taken to Lab for analysis []  - 0 Patient Transfer (multiple staff / Civil Service fast streamer / Similar devices) []  - 0 Simple Staple / Suture removal (25 or less) []  - 0 Complex Staple / Suture removal (26 or more) []  - 0 Hypo / Hyperglycemic Management (close monitor of Blood Glucose) []  - 0 Ankle / Brachial Index (ABI) - do not check if billed separately X- 1 5 Vital Signs Has the patient been seen at the hospital within the last three years: Yes Total Score: 45 Level Of Care: New/Established - Level 2 Electronic Signature(s) Signed: 06/14/2021 4:16:02 PM By: Mackenzie Amen RN Entered By: [name] on 06/14/2021 14:37:08 Panetta, [name] ([id number]) -------------------------------------------------------------------------------- Encounter Discharge Information Details Patient Name: [name] Date of Service: 06/14/2021 2:00 PM Medical Record Number: [id number] Patient Account Number: [id number] Date of Birth/Sex: June 05, 1932 (87 y.o. F) Treating RN: [name] Primary Care Armiyah Capron: [name] Other Clinician: Referring Monaca Wadas: [name] Treating Gedalia Mcmillon/Extender: [name] in Treatment: 5 Encounter Discharge Information Items Discharge Condition: Stable Ambulatory Status: Cane Discharge Destination: Home Transportation: Private Auto Accompanied By: self Schedule Follow-up Appointment: No Clinical Summary of Care: Electronic Signature(s) Signed: 06/14/2021 4:16:02 PM By: Mackenzie Amen RN Entered By: [name] on 06/14/2021 14:39:45 Antonini, [name]  ([id number]) -------------------------------------------------------------------------------- Lower Extremity Assessment Details Patient Name: [name] Date of Service: 06/14/2021 2:00 PM Medical Record Number: [id number] Patient Account Number: [id number] Date of Birth/Sex: [birth year] (87 y.o. F) Treating RN: [name] Primary Care Sutton Hirsch: [name] Other Clinician: Referring Roshonda Sperl: [name] Treating Agnieszka Newhouse/Extender: [name] in Treatment: 5 Electronic Signature(s) Signed: 06/14/2021 4:16:02 PM By: Mackenzie Amen RN Entered By: [name] on 06/14/2021 14:23:54 Vetere, [name] ([id number]) -------------------------------------------------------------------------------- Multi Wound Chart Details Patient Name: [name] Date of Service: 06/14/2021 2:00 PM Medical Record Number: [id number] Patient Account Number: [id number] Date of Birth/Sex: 04-17-1932 (87 y.o. F) Treating RN: [name] Primary Care Keaton Beichner: [name] Other Clinician: Referring Hadley Soileau: [name] Treating Aseel Uhde/Extender: [name] in Treatment: 5 Vital Signs Height(in): Pulse(bpm): 91 Weight(lbs): 131 Blood Pressure(mmHg): 107/69 Body Mass Index(BMI): Temperature(F): 98.0 Respiratory Rate(breaths/min): 18 Photos: [N/A:N/A] Wound Location: Right, Anterior Lower Leg N/A N/A Wounding Event: Hematoma N/A N/A Primary Etiology: Venous Leg Ulcer N/A N/A Comorbid History: Cataracts, Arrhythmia, N/A N/A Osteoarthritis, Neuropathy Date Acquired: 04/13/2021 N/A N/A [name] of Treatment: 5 N/A N/A Wound Status: Open N/A N/A Measurements L x W x D (cm) 0x0x0 N/A N/A Area (cm) : 0 N/A N/A Volume (cm) : 0 N/A N/A % Reduction in Area: 100.00% N/A N/A % Reduction in Volume: 100.00% N/A N/A Classification: Full Thickness Without Exposed N/A N/A Support Structures Exudate Amount: None Present N/A N/A Wound Margin: Flat and Intact N/A N/A Granulation Amount:  None Present (0%) N/A N/A Necrotic Amount: None Present (0%) N/A N/A Exposed Structures: Fascia: No N/A N/A Fat Layer (Subcutaneous Tissue): No Tendon: No Muscle: No Joint: No Bone: No Epithelialization: Large (67-100%) N/A N/A Treatment Notes Electronic Signature(s) Signed: 06/14/2021 4:16:02 PM By: Mackenzie Amen RN Entered By: [name] on 06/14/2021 14:36:24 Keller, [name] ([id number]) -------------------------------------------------------------------------------- [place] Details Patient Name: [name]  Date of Service: 06/14/2021 2:00 PM Medical Record Number: [id number] Patient Account Number: [id number] Date of Birth/Sex: Nov 07, 1931 (87 y.o. F) Treating RN: [name] Primary Care Leah Thornberry: [name] Other Clinician: Referring Loma [place]uque: [name] Treating Alexsa Flaum/Extender: [name] in Treatment: 5 Active Inactive Electronic Signature(s) Signed: 06/14/2021 4:16:02 PM By: Mackenzie Amen RN Entered By: [name] on 06/14/2021 14:36:17 Goughnour, [name] ([id number]) -------------------------------------------------------------------------------- Pain Assessment Details Patient Name: [name] Date of Service: 06/14/2021 2:00 PM Medical Record Number: [id number] Patient Account Number: [id number] Date of Birth/Sex: [birth year] (87 y.o. F) Treating RN: [name] Primary Care Eduin Friedel: [name] Other Clinician: Referring Swayzie Choate: [name] Treating Kjell Brannen/Extender: [name] in Treatment: 5 Active Problems Location of Pain Severity and Description of Pain Patient Has Paino No Site Locations Rate the pain. Current Pain Level: 0 Pain Management and Medication Current Pain Management: Electronic Signature(s) Signed: 06/14/2021 4:16:02 PM By: Mackenzie Amen RN Entered By: [name] on 06/14/2021 14:21:18 Sonneborn, [name]  ([id number]) -------------------------------------------------------------------------------- Patient/Caregiver Education Details Patient Name: [name] Date of Service: 06/14/2021 2:00 PM Medical Record Number: [id number] Patient Account Number: [id number] Date of Birth/Gender: 1931/10/19 (87 y.o. F) Treating RN: [name] Primary Care Physician: [name] Other Clinician: Referring Physician: Eliezer [name] Treating Physician/Extender: [name] in Treatment: 5 Education Assessment Education Provided To: Patient Education Topics Provided Notes discharge instructions Electronic Signature(s) Signed: 06/14/2021 4:16:02 PM By: Mackenzie Amen RN Entered By: [name] on 06/14/2021 14:39:18 Roepke, [name] ([id number]) -------------------------------------------------------------------------------- Wound Assessment Details Patient Name: [name] Date of Service: 06/14/2021 2:00 PM Medical Record Number: [id number] Patient Account Number: [id number] Date of Birth/Sex: 1932/01/21 (87 y.o. F) Treating RN: [name] Primary Care Kayan Blissett: [name] Other Clinician: Referring Tiajah Oyster: [name] Treating Rannie Craney/Extender: [name] in Treatment: 5 Wound Status Wound Number: 1 Primary Etiology: Venous Leg Ulcer Wound Location: Right, Anterior Lower Leg Wound Status: Open Wounding Event: Hematoma Comorbid History: Cataracts, Arrhythmia, Osteoarthritis, Neuropathy Date Acquired: 04/13/2021 [name] Of Treatment: 5 Clustered Wound: No Photos Wound Measurements Length: (cm) 0 Width: (cm) 0 Depth: (cm) 0 Area: (cm) 0 Volume: (cm) 0 % Reduction in Area: 100% % Reduction in Volume: 100% Epithelialization: Large (67-100%) Tunneling: No Undermining: No Wound Description Classification: Full Thickness Without Exposed Support Structures Wound Margin: Flat and Intact Exudate Amount: None Present Foul Odor After Cleansing:  No Slough/Fibrino No Wound Bed Granulation Amount: None Present (0%) Exposed Structure Necrotic Amount: None Present (0%) Fascia Exposed: No Fat Layer (Subcutaneous Tissue) Exposed: No Tendon Exposed: No Muscle Exposed: No Joint Exposed: No Bone Exposed: No Electronic Signature(s) Signed: 06/14/2021 4:16:02 PM By: Mackenzie Amen RN Entered By: [name] on 06/14/2021 14:35:57 Romanoff, [name] ([id number]) -------------------------------------------------------------------------------- [place] Details Patient Name: [name] Date of Service: 06/14/2021 2:00 PM Medical Record Number: [id number] Patient Account Number: [id number] Date of Birth/Sex: 1932/08/30 (87 y.o. F) Treating RN: [name] Primary Care Azalie Harbeck: [name] Other Clinician: Referring Dhanush Jokerst: [name] Treating Mayur Duman/Extender: [name] in Treatment: 5 Vital Signs Time Taken: 14:20 Temperature (F): 98.0 Weight (lbs): 131 Pulse (bpm): 91 Respiratory Rate (breaths/min): 18 Blood Pressure (mmHg): 107/69 Reference Range: 80 - 120 mg / dl Electronic Signature(s) Signed: 06/14/2021 4:16:02 PM By: Mackenzie Amen RN Entered By: [name] on 06/14/2021 14:21:01

## 2021-06-14 NOTE — Progress Notes (Addendum)
[name] ([id number]) Visit Report for 06/14/2021 Chief Complaint Document Details Patient Name: [name], [name] Date of Service: 06/14/2021 2:00 PM Medical Record Number: [id number] Patient Account Number: [id number] Date of Birth/Sex: [birth year] (87 y.o. F) Treating RN: Dolan Amen Primary Care Provider: [name] Other Clinician: Referring Provider: [name] Treating Provider/Extender: Skipper Cliche in Treatment: 5 Information Obtained from: Patient Chief Complaint Right LE Ulcer Electronic Signature(s) Signed: 06/14/2021 2:14:20 PM By: Worthy Keeler PA-C Entered By: Worthy Keeler on 06/14/2021 14:14:19 [name], [name] ([id number]) -------------------------------------------------------------------------------- HPI Details Patient Name: [name] Date of Service: 06/14/2021 2:00 PM Medical Record Number: [id number] Patient Account Number: [id number] Date of Birth/Sex: 1932-02-10 (87 y.o. F) Treating RN: Dolan Amen Primary Care Provider: [name] Other Clinician: Referring Provider: [name] Treating Provider/Extender: Skipper Cliche in Treatment: 5 History of Present Illness HPI Description: 05/08/2021 upon evaluation today patient appears for initial inspection here in the clinic concerning issues that she has been having actually with a wound over the anterior portion of her right leg. She tells me that this was due to an injury where she struck this on something causing the initial trauma and she has been having trouble getting it healed. [name] over at [facility] has been helping take care of this. With that being said because it was not healing quite as quickly and effectively is what she wanted to see she did want [place] to take a look and make any recommendations to try to help things along. I am definitely happy to do that for sure. Fortunately there does not appear to be any signs of active infection at this time which is great news. The  patient also does not seem to have too deep of the wound which is also good news. Her biggest concern is she really does not want to come weekly to the clinic especially since she has care at the [facility] location as far as getting the dressing changed and keeping an eye on it. She does have a history of atrial fibrillation for which she is on warfarin and she does not like to do water aerobics twice a week although she is not able to right now due to the fact that she has this wound and cannot get in the water. That is a significant issue for her at this point. Otherwise does have noncompressible pulses based on what we are seeing today. She wears compression socks for her chronic venous insufficiency she has had previous ablation therapy. 05/29/2021 upon evaluation today patient appears to be doing well with regard to her wound. Overall this is actually showing signs of good improvement with significant overall decrease in size since last week even. Overall I feel like that she is doing awesome and making excellent progress. Over at [facility] they been taking good care of her. 06/14/2021 upon evaluation today patient appears to be doing excellent in regard to her wound. In fact she is completely healed based on what we are seeing currently. I do not see any signs of active infection at this time which is great news. Electronic Signature(s) Signed: 06/14/2021 2:38:36 PM By: Worthy Keeler PA-C Entered By: Worthy Keeler on 06/14/2021 14:38:36 [name], [name] ([id number]) -------------------------------------------------------------------------------- Physical Exam Details Patient Name: [name] Date of Service: 06/14/2021 2:00 PM Medical Record Number: [id number] Patient Account Number: [id number] Date of Birth/Sex: [birth year] (87 y.o. F) Treating RN: Dolan Amen Primary Care Provider: [name] Other Clinician: Referring  Provider: [name] Treating Provider/Extender: Skipper Cliche in Treatment: 5 Constitutional Well-nourished and well-hydrated in no acute distress. Respiratory normal breathing without difficulty. Psychiatric this patient is able to make decisions and demonstrates good insight into disease process. Alert and Oriented x 3. pleasant and cooperative. Notes Patient's wound bed showed signs of good epithelization at this point. Fortunately I do not see any evidence of active infection at this time which is great news and overall I am extremely pleased with where Things stand at this point. Electronic Signature(s) Signed: 06/14/2021 2:39:28 PM By: Worthy Keeler PA-C Entered By: Worthy Keeler on 06/14/2021 14:39:28 [name], [name] ([id number]) -------------------------------------------------------------------------------- Physician Orders Details Patient Name: [name] Date of Service: 06/14/2021 2:00 PM Medical Record Number: [id number] Patient Account Number: [id number] Date of Birth/Sex: [birth year] (87 y.o. F) Treating RN: Dolan Amen Primary Care Provider: [name] Other Clinician: Referring Provider: [name] Treating Provider/Extender: Skipper Cliche in Treatment: 5 Verbal / Phone Orders: No Diagnosis Coding ICD-10 Coding Code Description I87.2 Venous insufficiency (chronic) (peripheral) L97.812 Non-pressure chronic ulcer of other part of right lower leg with fat layer exposed I48.0 Paroxysmal atrial fibrillation Z79.01 Long term (current) use of anticoagulants I73.89 Other specified peripheral vascular diseases Discharge From [facility] Services o Discharge from [facility] o Wear compression garments daily. Put garments on first thing when you wake up and remove them before bed. o Moisturize legs daily after removing compression garments. Electronic Signature(s) Signed: 06/14/2021 4:16:02 PM By: Dolan Amen RN Signed: 06/14/2021 5:23:54 PM By: Worthy Keeler PA-C Entered By:  Dolan Amen on 06/14/2021 14:37:31 [name], [name] ([id number]) -------------------------------------------------------------------------------- Problem List Details Patient Name: [name] Date of Service: 06/14/2021 2:00 PM Medical Record Number: [id number] Patient Account Number: [id number] Date of Birth/Sex: 1932-06-12 (87 y.o. F) Treating RN: Dolan Amen Primary Care Provider: [name] Other Clinician: Referring Provider: [name] Treating Provider/Extender: Skipper Cliche in Treatment: 5 Active Problems ICD-10 Encounter Code Description Active Date MDM Diagnosis I87.2 Venous insufficiency (chronic) (peripheral) 05/08/2021 No Yes L97.812 Non-pressure chronic ulcer of other part of right lower leg with fat layer 05/08/2021 No Yes exposed I48.0 Paroxysmal atrial fibrillation 05/08/2021 No Yes Z79.01 Long term (current) use of anticoagulants 05/08/2021 No Yes I73.89 Other specified peripheral vascular diseases 05/08/2021 No Yes Inactive Problems Resolved Problems Electronic Signature(s) Signed: 06/14/2021 2:14:13 PM By: Worthy Keeler PA-C Entered By: Worthy Keeler on 06/14/2021 14:14:13 [name], [name] ([id number]) -------------------------------------------------------------------------------- Progress Note Details Patient Name: [name] Date of Service: 06/14/2021 2:00 PM Medical Record Number: [id number] Patient Account Number: [id number] Date of Birth/Sex: 1932/02/17 (87 y.o. F) Treating RN: Dolan Amen Primary Care Provider: [name] Other Clinician: Referring Provider: [name] Treating Provider/Extender: Skipper Cliche in Treatment: 5 Subjective Chief Complaint Information obtained from Patient Right LE Ulcer History of Present Illness (HPI) 05/08/2021 upon evaluation today patient appears for initial inspection here in the clinic concerning issues that she has been having actually with a wound over the anterior portion of her  right leg. She tells me that this was due to an injury where she struck this on something causing the initial trauma and she has been having trouble getting it healed. [name] over at [facility] has been helping take care of this. With that being said because it was not healing quite as quickly and effectively is what she wanted to see she did want [place] to take a look and make  any recommendations to try to help things along. I am definitely happy to do that for sure. Fortunately there does not appear to be any signs of active infection at this time which is great news. The patient also does not seem to have too deep of the wound which is also good news. Her biggest concern is she really does not want to come weekly to the clinic especially since she has care at the [facility] location as far as getting the dressing changed and keeping an eye on it. She does have a history of atrial fibrillation for which she is on warfarin and she does not like to do water aerobics twice a week although she is not able to right now due to the fact that she has this wound and cannot get in the water. That is a significant issue for her at this point. Otherwise does have noncompressible pulses based on what we are seeing today. She wears compression socks for her chronic venous insufficiency she has had previous ablation therapy. 05/29/2021 upon evaluation today patient appears to be doing well with regard to her wound. Overall this is actually showing signs of good improvement with significant overall decrease in size since last week even. Overall I feel like that she is doing awesome and making excellent progress. Over at [facility] they been taking good care of her. 06/14/2021 upon evaluation today patient appears to be doing excellent in regard to her wound. In fact she is completely healed based on what we are seeing currently. I do not see any signs of active infection at this time which is great  news. Objective Constitutional Well-nourished and well-hydrated in no acute distress. Vitals Time Taken: 2:20 PM, Weight: 131 lbs, Temperature: 98.0 F, Pulse: 91 bpm, Respiratory Rate: 18 breaths/min, Blood Pressure: 107/69 mmHg. Respiratory normal breathing without difficulty. Psychiatric this patient is able to make decisions and demonstrates good insight into disease process. Alert and Oriented x 3. pleasant and cooperative. General Notes: Patient's wound bed showed signs of good epithelization at this point. Fortunately I do not see any evidence of active infection at this time which is great news and overall I am extremely pleased with where Things stand at this point. Integumentary (Hair, Skin) Wound #1 status is Open. Original cause of wound was Hematoma. The date acquired was: 04/13/2021. The wound has been in treatment 5 weeks. The wound is located on the Right,Anterior Lower Leg. The wound measures 0cm length x 0cm width x 0cm depth; 0cm^2 area and 0cm^3 volume. There is no tunneling or undermining noted. There is a none present amount of drainage noted. The wound margin is flat and intact. There is no granulation within the wound bed. There is no necrotic tissue within the wound bed. [name], [name] ([id number]) Assessment Active Problems ICD-10 Venous insufficiency (chronic) (peripheral) Non-pressure chronic ulcer of other part of right lower leg with fat layer exposed Paroxysmal atrial fibrillation Long term (current) use of anticoagulants Other specified peripheral vascular diseases Plan Discharge From [facility] Services: Discharge from [facility] Wear compression garments daily. Put garments on first thing when you wake up and remove them before bed. Moisturize legs daily after removing compression garments. 1. I do believe were ready for discharge I think the patient has her compression socks he should be wearing I would recommend she continue  with such. 2. I am also going to recommend that we have the patient continue to monitor for any signs of worsening  or infection if anything changes she should let me know soon as possible. 3. With regard to the wound care at [facility] they have done an awesome job helping take care of her at this point I would recommend that they just monitor and ensure nothing changes no further wound care measures are needed at this point. We will see patient back for reevaluation in 1 week here in the clinic. If anything worsens or changes patient will contact our office for additional recommendations. Electronic Signature(s) Signed: 06/14/2021 2:40:16 PM By: Worthy Keeler PA-C Entered By: Worthy Keeler on 06/14/2021 14:40:16 [name], [name] ([id number]) -------------------------------------------------------------------------------- SuperBill Details Patient Name: [name] Date of Service: 06/14/2021 Medical Record Number: [id number] Patient Account Number: [id number] Date of Birth/Sex: 1932/06/13 (87 y.o. F) Treating RN: Dolan Amen Primary Care Provider: [name] Other Clinician: Referring Provider: [name] Treating Provider/Extender: Skipper Cliche in Treatment: 5 Diagnosis Coding ICD-10 Codes Code Description I87.2 Venous insufficiency (chronic) (peripheral) L97.812 Non-pressure chronic ulcer of other part of right lower leg with fat layer exposed I48.0 Paroxysmal atrial fibrillation Z79.01 Long term (current) use of anticoagulants I73.89 Other specified peripheral vascular diseases Facility Procedures CPT4 Code: [id number] Description: [phone] - WOUND CARE VISIT-LEV 2 EST PT Modifier: Quantity: 1 Physician Procedures CPT4 Code: 0223361 Description: 22449 - WC PHYS LEVEL 3 - EST PT Modifier: Quantity: 1 CPT4 Code: Description: ICD-10 Diagnosis Description I87.2 Venous insufficiency (chronic) (peripheral) L97.812 Non-pressure chronic ulcer of other part of right lower  leg with fat la I48.0 Paroxysmal atrial fibrillation Z79.01 Long term (current) use of  anticoagulants Modifier: yer exposed Quantity: Electronic Signature(s) Signed: 06/14/2021 2:40:28 PM By: Worthy Keeler PA-C Entered By: Worthy Keeler on 06/14/2021 14:40:28

## 2021-06-14 NOTE — Progress Notes (Signed)
This patient returns to my office for at risk foot care.  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  Patient is taking coumadin.  This patient is unable to cut nails herself since the patient cannot reach her nails.These nails are painful walking and wearing shoes.  She presents for at risk care today.  General Appearance  Alert, conversant and in no acute stress.  Vascular  Dorsalis pedis and posterior tibial  pulses are not  palpable  bilaterally.  Capillary return is within normal limits  bilaterally. Cold feet.  Absent hair.  Venous stasis dermatitis  Legs  B/L. bilaterally. Wound right lower leg which is under treatment at wound center.  Neurologic  Senn-Weinstein monofilament wire test within normal limits  bilaterally. Muscle power within normal limits bilaterally.  Nails Thick disfigured discolored nails with subungual debris  from hallux to fifth toes bilaterally. No evidence of bacterial infection or drainage bilaterally.  Orthopedic  No limitations of motion  feet .  No crepitus or effusions noted.  Hammer toes 2-4  B/L.  Hallux malleus right foot.  Skin  normotropic skin with no porokeratosis noted bilaterally.  No signs of infections or ulcers noted.     Onychomycosis  Pain in right toes  Pain in left toes  Consent was obtained for treatment procedures.    Mechanical debridement of nails 1-5  bilaterally performed with a nail nipper.  Filed with dremel without incident.   Gave her padding for hallux malleus.   Return office visit   4  months                  Told patient to return for periodic foot care and evaluation due to potential at risk complications.   Gardiner Barefoot DPM

## 2021-06-18 ENCOUNTER — Other Ambulatory Visit: Payer: Self-pay | Admitting: Cardiovascular Disease

## 2021-06-18 DIAGNOSIS — Z7901 Long term (current) use of anticoagulants: Secondary | ICD-10-CM | POA: Diagnosis not present

## 2021-06-18 DIAGNOSIS — I482 Chronic atrial fibrillation, unspecified: Secondary | ICD-10-CM | POA: Diagnosis not present

## 2021-06-18 DIAGNOSIS — D802 Selective deficiency of immunoglobulin A [IgA]: Secondary | ICD-10-CM | POA: Diagnosis not present

## 2021-06-18 LAB — POCT INR: INR: 2.7 (ref 2.0–3.0)

## 2021-06-19 ENCOUNTER — Ambulatory Visit (INDEPENDENT_AMBULATORY_CARE_PROVIDER_SITE_OTHER): Payer: PPO

## 2021-06-19 DIAGNOSIS — Z7901 Long term (current) use of anticoagulants: Secondary | ICD-10-CM | POA: Diagnosis not present

## 2021-06-19 NOTE — Patient Instructions (Addendum)
Pre visit review using our clinic review tool, if applicable. No additional management support is needed unless otherwise documented below in the visit note.  Continue 5 mg everyday EXCEPT take 2.5 mg on Sundays. Re-check in 4 weeks.

## 2021-06-23 ENCOUNTER — Other Ambulatory Visit: Payer: Self-pay | Admitting: Cardiovascular Disease

## 2021-07-10 ENCOUNTER — Other Ambulatory Visit: Payer: Self-pay

## 2021-07-10 ENCOUNTER — Ambulatory Visit: Payer: PPO | Admitting: Internal Medicine

## 2021-07-10 ENCOUNTER — Encounter: Payer: Self-pay | Admitting: Internal Medicine

## 2021-07-10 VITALS — BP 120/82 | HR 66 | Temp 97.1°F | Ht 68.0 in | Wt 136.0 lb

## 2021-07-10 DIAGNOSIS — J449 Chronic obstructive pulmonary disease, unspecified: Secondary | ICD-10-CM

## 2021-07-10 NOTE — Patient Instructions (Addendum)
LET's HOLD ADVAIR AND ASSESS BREATHING

## 2021-07-10 NOTE — Progress Notes (Signed)
Name: Rayel Santizo MRN: [id number] DOB: May 22, 1932     CONSULTATION DATE: 07/10/2021  REFERRING MD : Carlean Purl   Synopsis 87 year old white female chronic A. fib on warfarin History of COPD 40 years smoking Quit smoking 2009 Advair discus 100 once a day  CC Follow-up COPD assessment  HPI Chronic shortness of breath and dyspnea exertion Seems to be stable over the last several years Previous Joselyn Arrow and 6-minute walk test did not reveal hypoxia  Last set up pulmonary function test in June 2021 shows FEV1 61% predicted Moderate to severe COPD   No exacerbation at this time No evidence of heart failure at this time No evidence or signs of infection at this time No respiratory distress No fevers, chills, nausea, vomiting, diarrhea No evidence of lower extremity edema No evidence hemoptysis   PATIENT QUIT SMOKING 2002   PAST MEDICAL HISTORY :   has a past medical history of Atrial fibrillation ([place]), COPD (chronic obstructive pulmonary disease) ([place]), Hypothyroidism, Microscopic colitis, Osteoporosis, Squamous cell carcinoma, and Syncope.  has a past surgical history that includes Appendectomy; Lipoma excision; Cataract extraction (Bilateral); Colonoscopy; Multiple tooth extractions; Refractive surgery; and Squamous cell carcinoma excision (07/10/2017). Prior to Admission medications   Medication Sig Start Date End Date Taking? Authorizing Provider  ADVAIR DISKUS 100-50 MCG/DOSE AEPB USE 1 INHALATION BY MOUTH TWICE DAILY 03/20/18   Elby Beck, FNP  Cholecalciferol (VITAMIN D-3) 1000 UNITS CAPS Take 1,000 Units by mouth every other day.     [provider]  digoxin (LANOXIN) 0.125 MG tablet Take 1 tablet (125 mcg total) by mouth daily. 06/14/19   Minna Merritts, MD  gabapentin (NEURONTIN) 100 MG capsule Take 100 mg by mouth. Pt takes 2 in the am and 2 at night 11/27/16   [provider]  levothyroxine (SYNTHROID) 50 MCG tablet TAKE 1 TABLET BY MOUTH  DAILY, THEN 1/2 TABLET THE NEXT DAY ALTERNATING 01/29/19   Elby Beck, FNP  verapamil (CALAN-SR) 120 MG CR tablet TAKE 1 TABLET(120 MG) BY MOUTH DAILY 06/14/19   Minna Merritts, MD  warfarin (COUMADIN) 5 MG tablet TAKE AS DIRECTED BY CLINIC 06/16/19   Elby Beck, FNP   Allergies  Allergen Reactions   Sulfa Antibiotics Nausea And Vomiting    FAMILY HISTORY:  family history includes Alzheimer's disease in her mother; Brain cancer in her father; Cancer (age of onset: 60) in her brother. SOCIAL HISTORY:  reports that she quit smoking about 13 years ago. Her smoking use included cigarettes. She has a 40.00 pack-year smoking history. She has never used smokeless tobacco. She reports that she does not currently use alcohol. She reports that she does not use drugs.    Review of Systems:  Gen:  Denies  fever, sweats, chills weight loss  HEENT: Denies blurred vision, double vision, ear pain, eye pain, hearing loss, nose bleeds, sore throat Cardiac:  No dizziness, chest pain or heaviness, chest tightness,edema, No JVD Resp:   No cough, -sputum production, -shortness of breath,-wheezing, -hemoptysis,  Other:  All other systems negative  BP 120/82 (BP Location: Left Arm, Patient Position: Sitting, Cuff Size: Small)   Pulse 66   Temp (!) 97.1 F (36.2 C)   Ht 5\' 8"  (1.727 m)   Wt 136 lb (61.7 kg)   SpO2 97%   BMI 20.68 kg/m    Physical Examination:   General Appearance: No distress  EYES PERRLA, EOM intact.   NECK Supple, No JVD Pulmonary: normal breath sounds,  No wheezing.  CardiovascularNormal S1,S2.  No m/r/g.   ALL OTHER ROS ARE NEGATIVE     ASSESSMENT AND PLAN SYNOPSIS  87 year old pleasant white female seen today for follow-up assessment for COPD moderate to severe in nature based on pulmonary function testing several years ago   Moderate to severe COPD  Symptoms stable-will hold ADVAIR for now and assess breathing Albuterol as needed  No exacerbation at  this time  No indication for antibiotics or steroids    Patient satisfied with Plan of action and management. All questions answered  Follow-up 1 year  Total time spent 15 minutes   Yanna Leaks Patricia Pesa, M.D.  [facility]  Medical Director [place] Director [facility] Cardio-Pulmonary Department

## 2021-07-16 DIAGNOSIS — I482 Chronic atrial fibrillation, unspecified: Secondary | ICD-10-CM | POA: Diagnosis not present

## 2021-07-16 DIAGNOSIS — D802 Selective deficiency of immunoglobulin A [IgA]: Secondary | ICD-10-CM | POA: Diagnosis not present

## 2021-07-16 DIAGNOSIS — Z7901 Long term (current) use of anticoagulants: Secondary | ICD-10-CM | POA: Diagnosis not present

## 2021-07-16 LAB — POCT INR: INR: 2.7 (ref 2.0–3.0)

## 2021-07-18 ENCOUNTER — Ambulatory Visit (INDEPENDENT_AMBULATORY_CARE_PROVIDER_SITE_OTHER): Payer: PPO

## 2021-07-18 DIAGNOSIS — Z7901 Long term (current) use of anticoagulants: Secondary | ICD-10-CM | POA: Diagnosis not present

## 2021-07-18 DIAGNOSIS — Z961 Presence of intraocular lens: Secondary | ICD-10-CM | POA: Diagnosis not present

## 2021-07-18 NOTE — Progress Notes (Signed)
Continue 5 mg everyday EXCEPT take 2.5 mg on Sundays. Re-check in 4 weeks.

## 2021-07-18 NOTE — Patient Instructions (Addendum)
Pre visit review using our clinic review tool, if applicable. No additional management support is needed unless otherwise documented below in the visit note.  Continue 5 mg everyday EXCEPT take 2.5 mg on Sundays. Re-check in 4 weeks.

## 2021-08-02 ENCOUNTER — Encounter: Payer: Self-pay | Admitting: Internal Medicine

## 2021-08-02 ENCOUNTER — Telehealth: Payer: Self-pay | Admitting: Internal Medicine

## 2021-08-02 NOTE — Telephone Encounter (Signed)
Spoke to patient.  Patient stated that she was instructed to hold advair and assess breathing at last OV.  She wanted to make Dr. Mortimer Fries aware that she had to resume adviar after stopping for 4 days due to increased sob. family hx has been updated per patient request. Nothing further needed at this time.  Routing to Dr. Mortimer Fries as an Juluis Rainier.

## 2021-08-13 ENCOUNTER — Ambulatory Visit (INDEPENDENT_AMBULATORY_CARE_PROVIDER_SITE_OTHER): Payer: PPO

## 2021-08-13 DIAGNOSIS — D802 Selective deficiency of immunoglobulin A [IgA]: Secondary | ICD-10-CM | POA: Diagnosis not present

## 2021-08-13 DIAGNOSIS — Z7901 Long term (current) use of anticoagulants: Secondary | ICD-10-CM

## 2021-08-13 DIAGNOSIS — I482 Chronic atrial fibrillation, unspecified: Secondary | ICD-10-CM | POA: Diagnosis not present

## 2021-08-13 LAB — POCT INR: INR: 2.5 (ref 2.0–3.0)

## 2021-08-13 NOTE — Patient Instructions (Addendum)
Pre visit review using our clinic review tool, if applicable. No additional management support is needed unless otherwise documented below in the visit note.  Continue 5 mg everyday EXCEPT take 2.5 mg on Sundays. Re-check in 4 weeks.

## 2021-08-13 NOTE — Progress Notes (Signed)
Continue 5 mg everyday EXCEPT take 2.5 mg on Sundays. Re-check in 4 weeks.

## 2021-09-13 ENCOUNTER — Ambulatory Visit (INDEPENDENT_AMBULATORY_CARE_PROVIDER_SITE_OTHER): Payer: PPO

## 2021-09-13 DIAGNOSIS — I482 Chronic atrial fibrillation, unspecified: Secondary | ICD-10-CM | POA: Diagnosis not present

## 2021-09-13 DIAGNOSIS — Z7901 Long term (current) use of anticoagulants: Secondary | ICD-10-CM

## 2021-09-13 DIAGNOSIS — D802 Selective deficiency of immunoglobulin A [IgA]: Secondary | ICD-10-CM | POA: Diagnosis not present

## 2021-09-13 LAB — POCT INR: INR: 3 (ref 2.0–3.0)

## 2021-09-13 LAB — PROTIME-INR: INR: 3 — AB (ref 0.80–1.20)

## 2021-09-13 NOTE — Patient Instructions (Signed)
Pre visit review using our clinic review tool, if applicable. No additional management support is needed unless otherwise documented below in the visit note. 

## 2021-09-13 NOTE — Progress Notes (Signed)
Patient ID: [name], female   DOB: [birth year], 87 y.o.   MRN: [id number]  Medical screening examination/treatment/procedure(s) were performed by non-physician practitioner and as supervising physician I was immediately available for consultation/collaboration.  I agree with above. Cathlean Cower, MD

## 2021-09-13 NOTE — Progress Notes (Signed)
Continue 5 mg everyday EXCEPT take 2.5 mg on Sundays. Re-check in 4 weeks.

## 2021-09-21 ENCOUNTER — Other Ambulatory Visit: Payer: Self-pay | Admitting: Family Medicine

## 2021-09-21 DIAGNOSIS — Z7901 Long term (current) use of anticoagulants: Secondary | ICD-10-CM

## 2021-09-21 NOTE — Telephone Encounter (Signed)
Pt is compliant with warfarin management and PCP apts. ?Sent in refill.  ?

## 2021-09-22 ENCOUNTER — Encounter: Payer: Self-pay | Admitting: Family Medicine

## 2021-09-24 NOTE — Telephone Encounter (Signed)
Pt reports pharmacy said they would not fill the warfarin script for 100 due to insurance denying it. Contacted [facility] and spoke to an Environmental consultant there, Tabitha, who reports she tried to run it through for 100 and it was denied so she heard the pt was out of the medication and they dispensed 30 tablets. Not sure why they did not run 90 tablets. She tried to run it through again for the 70 missing tablets and also 60 tablets but reports the insurance denied it again. Inquired if they denied it due to the pt already picking up 30 days worth on Saturday. She could not answer that and said there is nothing else they can do. Asked to speak to the pharmacist and she said she was busy at the moment.   Contacted the pharmacist, Gerald Stabs, who reported this has been run over and over and there is nothing else they can do. Inquired why they did not dispense 90 tablets instead of 30 and she reported the insurance did not want to fill a warfarin script for a 90 days supply and that they would only authorize 30 days. Advised this nurse will f/u with the pt. Pharmacist then huffed and hung up abruptly.    Contacted pt and advised of pharmacist response. She is going to contact her insurance agent and see if they can help her. Advised if there is anything else this nurse can do to contact the coumadin clinic. Pt was very appreciative and verbalized understanding.

## 2021-10-04 DIAGNOSIS — L570 Actinic keratosis: Secondary | ICD-10-CM | POA: Diagnosis not present

## 2021-10-04 DIAGNOSIS — Z85828 Personal history of other malignant neoplasm of skin: Secondary | ICD-10-CM | POA: Diagnosis not present

## 2021-10-04 DIAGNOSIS — I872 Venous insufficiency (chronic) (peripheral): Secondary | ICD-10-CM | POA: Diagnosis not present

## 2021-10-04 DIAGNOSIS — L821 Other seborrheic keratosis: Secondary | ICD-10-CM | POA: Diagnosis not present

## 2021-10-04 DIAGNOSIS — Z08 Encounter for follow-up examination after completed treatment for malignant neoplasm: Secondary | ICD-10-CM | POA: Diagnosis not present

## 2021-10-11 ENCOUNTER — Ambulatory Visit (INDEPENDENT_AMBULATORY_CARE_PROVIDER_SITE_OTHER): Payer: PPO

## 2021-10-11 DIAGNOSIS — Z7901 Long term (current) use of anticoagulants: Secondary | ICD-10-CM | POA: Diagnosis not present

## 2021-10-11 DIAGNOSIS — D802 Selective deficiency of immunoglobulin A [IgA]: Secondary | ICD-10-CM | POA: Diagnosis not present

## 2021-10-11 DIAGNOSIS — I482 Chronic atrial fibrillation, unspecified: Secondary | ICD-10-CM | POA: Diagnosis not present

## 2021-10-11 LAB — POCT INR: INR: 2.6 (ref 2.0–3.0)

## 2021-10-11 LAB — PROTIME-INR: INR: 2.6 — AB (ref 0.80–1.20)

## 2021-10-11 NOTE — Patient Instructions (Addendum)
Pre visit review using our clinic review tool, if applicable. No additional management support is needed unless otherwise documented below in the visit note.  Continue 5 mg everyday EXCEPT take 2.5 mg on Sundays. Re-check in 4 weeks.

## 2021-10-11 NOTE — Progress Notes (Signed)
Continue 5 mg everyday EXCEPT take 2.5 mg on Sundays. Re-check in 4 weeks.

## 2021-10-15 ENCOUNTER — Other Ambulatory Visit: Payer: Self-pay

## 2021-10-15 ENCOUNTER — Encounter: Payer: Self-pay | Admitting: Podiatry

## 2021-10-15 ENCOUNTER — Ambulatory Visit: Payer: PPO | Admitting: Podiatry

## 2021-10-15 DIAGNOSIS — M79675 Pain in left toe(s): Secondary | ICD-10-CM | POA: Diagnosis not present

## 2021-10-15 DIAGNOSIS — D689 Coagulation defect, unspecified: Secondary | ICD-10-CM | POA: Diagnosis not present

## 2021-10-15 DIAGNOSIS — I872 Venous insufficiency (chronic) (peripheral): Secondary | ICD-10-CM | POA: Diagnosis not present

## 2021-10-15 DIAGNOSIS — B351 Tinea unguium: Secondary | ICD-10-CM

## 2021-10-15 DIAGNOSIS — M79674 Pain in right toe(s): Secondary | ICD-10-CM | POA: Diagnosis not present

## 2021-10-15 DIAGNOSIS — M2010 Hallux valgus (acquired), unspecified foot: Secondary | ICD-10-CM | POA: Diagnosis not present

## 2021-10-15 NOTE — Progress Notes (Signed)
This patient returns to my office for at risk foot care.  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  Patient is taking coumadin.  This patient is unable to cut nails herself since the patient cannot reach her nails.These nails are painful walking and wearing shoes.  She presents for at risk care today.  General Appearance  Alert, conversant and in no acute stress.  Vascular  Dorsalis pedis and posterior tibial  pulses are not  palpable  bilaterally.  Capillary return is within normal limits  bilaterally. Cold feet.  Absent hair.  Venous stasis dermatitis  Legs  B/L. bilaterally. Wound right lower leg which is under treatment at wound center.  Neurologic  Senn-Weinstein monofilament wire test within normal limits  bilaterally. Muscle power within normal limits bilaterally.  Nails Thick disfigured discolored nails with subungual debris  from hallux to fifth toes bilaterally. No evidence of bacterial infection or drainage bilaterally.  Orthopedic  No limitations of motion  feet .  No crepitus or effusions noted.  Hammer toes 2-4  B/L.  Hallux malleus right foot.  Skin  normotropic skin with no porokeratosis noted bilaterally.  No signs of infections or ulcers noted.     Onychomycosis  Pain in right toes  Pain in left toes  Consent was obtained for treatment procedures.    Mechanical debridement of nails 1-5  bilaterally performed with a nail nipper.  Filed with dremel without incident.   Gave her padding for hallux malleus.   Return office visit   4  months                  Told patient to return for periodic foot care and evaluation due to potential at risk complications.   Gardiner Barefoot DPM

## 2021-10-22 DIAGNOSIS — M25512 Pain in left shoulder: Secondary | ICD-10-CM | POA: Diagnosis not present

## 2021-10-26 ENCOUNTER — Telehealth: Payer: Self-pay | Admitting: Family Medicine

## 2021-10-26 NOTE — Telephone Encounter (Signed)
Pt scheduled cpe/lab in april

## 2021-10-26 NOTE — Telephone Encounter (Signed)
Please schedule CPE with fasting labs prior for Dr. Diona Browner .  She is already scheduled for [place] with nurse on 11/15/21 so please schedule after that.

## 2021-11-02 ENCOUNTER — Telehealth: Payer: Self-pay | Admitting: Family Medicine

## 2021-11-02 DIAGNOSIS — Z7901 Long term (current) use of anticoagulants: Secondary | ICD-10-CM

## 2021-11-02 MED ORDER — WARFARIN SODIUM 5 MG PO TABS: ORAL_TABLET | ORAL | 1 refills | Status: DC

## 2021-11-02 NOTE — Telephone Encounter (Signed)
Pt compliant with warfarin management and PCP apts. Sent in refill. 

## 2021-11-02 NOTE — Addendum Note (Signed)
Addended by: Randall An on: 11/02/2021 03:56 PM   Modules accepted: Orders

## 2021-11-02 NOTE — Telephone Encounter (Signed)
error 

## 2021-11-02 NOTE — Telephone Encounter (Signed)
°  Encourage patient to contact the pharmacy for refills or they can request refills through [facility]:  Please schedule appointment if longer than 1 year  NEXT APPOINTMENT DATE: 01/08/22  MEDICATION: warfarin (COUMADIN) 5 MG tablet  Is the patient out of medication? yes  PHARMACY: [facility] [address]  Let patient know to contact pharmacy at the end of the day to make sure medication is ready.  Please notify patient to allow 48-72 hours to process  CLINICAL FILLS OUT ALL BELOW:   LAST REFILL:  QTY:  REFILL DATE:    OTHER COMMENTS:    Okay for refill?  Please advise

## 2021-11-05 DIAGNOSIS — M19012 Primary osteoarthritis, left shoulder: Secondary | ICD-10-CM | POA: Diagnosis not present

## 2021-11-08 DIAGNOSIS — Z7901 Long term (current) use of anticoagulants: Secondary | ICD-10-CM | POA: Diagnosis not present

## 2021-11-08 DIAGNOSIS — I482 Chronic atrial fibrillation, unspecified: Secondary | ICD-10-CM | POA: Diagnosis not present

## 2021-11-08 DIAGNOSIS — D802 Selective deficiency of immunoglobulin A [IgA]: Secondary | ICD-10-CM | POA: Diagnosis not present

## 2021-11-08 LAB — POCT INR
INR: 2.6 (ref 2.0–3.0)
INR: 2.8 (ref 2.0–3.0)

## 2021-11-09 ENCOUNTER — Ambulatory Visit (INDEPENDENT_AMBULATORY_CARE_PROVIDER_SITE_OTHER): Payer: PPO

## 2021-11-09 DIAGNOSIS — Z7901 Long term (current) use of anticoagulants: Secondary | ICD-10-CM

## 2021-11-09 NOTE — Patient Instructions (Addendum)
Pre visit review using our clinic review tool, if applicable. No additional management support is needed unless otherwise documented below in the visit note.  Continue 5 mg everyday EXCEPT take 2.5 mg on Sundays. Re-check in 4 weeks.

## 2021-11-09 NOTE — Progress Notes (Signed)
Continue 5 mg everyday EXCEPT take 2.5 mg on Sundays. Re-check in 4 weeks.

## 2021-11-14 NOTE — Progress Notes (Signed)
Subjective:   [name] is a 88 y.o. female who presents for Medicare Annual (Subsequent) preventive examination.  I connected with [name]  today by telephone and verified that I am speaking with the correct person using two identifiers. Location patient: home Location provider: work Persons participating in the virtual visit: patient, Marine scientist.    I discussed the limitations, risks, security and privacy concerns of performing an evaluation and management service by telephone and the availability of in person appointments. I also discussed with the patient that there may be a patient responsible charge related to this service. The patient expressed understanding and verbally consented to this telephonic visit.    Interactive audio and video telecommunications were attempted between this provider and patient, however failed, due to patient having technical difficulties OR patient did not have access to video capability.  We continued and completed visit with audio only.  Some vital signs may be absent or patient reported.   Time Spent with patient on telephone encounter: 25 minutes  Review of Systems     Cardiac Risk Factors include: advanced age (>69men, >68 women);dyslipidemia     Objective:    Today's Vitals   11/15/21 1444  Weight: 136 lb (61.7 kg)  Height: 5\' 8"  (1.727 m)   Body mass index is 20.68 kg/m.  Advanced Directives 11/15/2021 11/14/2020 11/02/2019 10/27/2018 10/21/2017 01/31/2017 12/16/2016  Does Patient Have a Medical Advance Directive? Yes Yes Yes Yes Yes Yes Yes  Type of Paramedic of [place];Living will [place];Living will [place];Living will [place];Living will [place];Living will [place];Living will [place];Living will  Does patient want to make changes to medical advance directive? Yes  (MAU/Ambulatory/Procedural Areas - Information given) - - - - - -  Copy of [place] in Chart? Yes - validated most recent copy scanned in chart (See row information) Yes - validated most recent copy scanned in chart (See row information) No - copy requested No - copy requested No - copy requested - -    Current Medications (verified) Outpatient Encounter Medications as of 11/15/2021  Medication Sig   Fluticasone-Salmeterol (ADVAIR) 100-50 MCG/DOSE AEPB INHALE 1 PUFF BY MOUTH TWICE DAILY   gabapentin (NEURONTIN) 100 MG capsule TAKE 3 CAPSULES(300 MG) BY MOUTH AT BEDTIME   ipratropium (ATROVENT) 0.03 % nasal spray PLACE 2 SPRAYS IN EACH NOSTRIL THREE TIMES DAILY AS NEEDED PRIOR TO ACTIVITIES THATS CAUSE NOSE TO RUN   levothyroxine (SYNTHROID) 75 MCG tablet TAKE 1 TABLET BY MOUTH 30 MINUTES BEFORE EATING WITH A FULL GLASS OF WATER   verapamil (CALAN-SR) 120 MG CR tablet TAKE 1 TABLET(120 MG) BY MOUTH DAILY   warfarin (COUMADIN) 5 MG tablet TAKE 1 TABLET BY MOUTH DAILY EXCEPT TAKE 1/2 TABLET ON SUNDAYS OR AS DIRECTED BY ANTICOAGULATION CLINIC   No facility-administered encounter medications on file as of 11/15/2021.    Allergies (verified) Sulfa antibiotics   History: Past Medical History:  Diagnosis Date   Atrial fibrillation (HCC)    COPD (chronic obstructive pulmonary disease) (HCC)    Hypothyroidism    Microscopic colitis    Osteoporosis    Squamous cell carcinoma    Syncope    Past Surgical History:  Procedure Laterality Date   APPENDECTOMY     CATARACT EXTRACTION Bilateral    COLONOSCOPY     LIPOMA EXCISION     MULTIPLE TOOTH EXTRACTIONS  REFRACTIVE SURGERY     SQUAMOUS CELL CARCINOMA EXCISION  07/10/2017   left shin   Family History  Problem Relation Age of Onset   Alzheimer's disease Mother    Brain cancer Father    Cancer Father    Cancer Brother 33       bladder   Bladder Cancer Brother    Skin cancer Brother    Social History    Socioeconomic History   Marital status: Widowed    Spouse name: Not on file   Number of children: Not on file   Years of education: Not on file   Highest education level: Not on file  Occupational History   Not on file  Tobacco Use   Smoking status: Former    Packs/day: 1.00    Years: 40.00    Pack years: 40.00    Types: Cigarettes    Quit date: 2009    Years since quitting: 14.1   Smokeless tobacco: Never  Vaping Use   Vaping Use: Never used  Substance and Sexual Activity   Alcohol use: Not Currently    Comment: social   Drug use: No   Sexual activity: Never  Other Topics Concern   Not on file  Social History Narrative   Recently moved to [facility] 03/2013 from [place].   Husband died in 11/14/11.      Son (adopted) lives in [place], [place].  Two grandchildren- 18, 51 yo.   DNR- forms signed and returned to patient.            Social Determinants of Health   Financial Resource Strain: Low Risk    Difficulty of Paying Living Expenses: Not very hard  Food Insecurity: No Food Insecurity   Worried About Charity fundraiser in the Last Year: Never true   Ran Out of Food in the Last Year: Never true  Transportation Needs: No Transportation Needs   Lack of Transportation (Medical): No   Lack of Transportation (Non-Medical): No  Physical Activity: Sufficiently Active   Days of Exercise per Week: 5 days   Minutes of Exercise per Session: 30 min  Stress: No Stress Concern Present   Feeling of Stress : Not at all  Social Connections: Moderately Integrated   Frequency of Communication with Friends and Family: More than three times a week   Frequency of Social Gatherings with Friends and Family: Three times a week   Attends Religious Services: More than 4 times per year   Active Member of Clubs or Organizations: Yes   Attends Archivist Meetings: Never   Marital Status: Widowed    Tobacco Counseling Counseling given: Not Answered   Clinical  Intake:  Pre-visit preparation completed: Yes        BMI - recorded: 20.68 Nutritional Status: BMI of 19-24  Normal Nutritional Risks: None Diabetes: No  How often do you need to have someone help you when you read instructions, pamphlets, or other written materials from your doctor or pharmacy?: 1 - Never  Diabetic? No  Interpreter Needed?: No  Information entered by :: Orrin Brigham LPN   Activities of Daily Living In your present state of health, do you have any difficulty performing the following activities: 11/15/2021  Hearing? Y  Vision? N  Difficulty concentrating or making decisions? N  Walking or climbing stairs? Y  Dressing or bathing? N  Doing errands, shopping? N  Preparing Food and eating ? N  Using the Toilet? N  In the past  six months, have you accidently leaked urine? Y  Comment occasional  Do you have problems with loss of bowel control? N  Managing your Medications? N  Managing your Finances? N  Housekeeping or managing your Housekeeping? Y  Comment has Chartered certified accountant come in 1 time per month  Some recent data might be hidden    Patient Care Team: Jinny Sanders, MD as PCP - General (Family Medicine) Oneta Rack, MD (Dermatology) Carloyn Manner, MD as Referring Physician (Otolaryngology) Estill Cotta, MD as Consulting Physician (Ophthalmology) Minna Merritts, MD as Consulting Physician (Cardiology) Josefine Class, MD as Referring Physician (Gastroenterology) Oneta Rack, MD (Dermatology)  Indicate any recent Medical Services you may have received from other than [facility] providers in the past year (date may be approximate).     Assessment:   This is a routine wellness examination for Aubriel Khanna.  Hearing/Vision screen Hearing Screening - Comments:: Patient states needing hearing aids  Vision Screening - Comments:: Last exam 08/2021, Dr. Thomasene Ripple , Wears glasses  Dietary issues and exercise activities  discussed: Current Exercise Habits: Home exercise routine, Type of exercise: strength training/weights;stretching;yoga;Other - see comments;walking (aquafit), Time (Minutes): 30, Frequency (Times/Week): 5, Weekly Exercise (Minutes/Week): 150, Intensity: Mild   Goals Addressed             This Visit's Progress    Patient Stated       Would like to stop eating sweets       Depression Screen PHQ 2/9 Scores 11/15/2021 11/14/2020 11/02/2019 10/27/2018 10/21/2017 10/15/2017 10/07/2016  PHQ - 2 Score 0 0 2 0 0 0 0  PHQ- 9 Score - 0 2 0 0 - -    Fall Risk Fall Risk  11/15/2021 11/14/2020 11/02/2019 10/27/2018 10/21/2017  Falls in the past year? 0 0 0 0 No  Number falls in past yr: 0 0 0 - -  Injury with Fall? 0 0 0 - -  Risk for fall due to : Impaired balance/gait Impaired balance/gait Impaired balance/gait - -  Follow up Falls prevention discussed Falls evaluation completed;Falls prevention discussed Falls evaluation completed;Falls prevention discussed - -    FALL RISK PREVENTION PERTAINING TO THE HOME:  Any stairs in or around the home? No  If so, are there any without handrails? No  Home free of loose throw rugs in walkways, pet beds, electrical cords, etc? Yes  Adequate lighting in your home to reduce risk of falls? No   ASSISTIVE DEVICES UTILIZED TO PREVENT FALLS:  Life alert? No  Use of a cane, walker or w/c? Yes , walker, cane Grab bars in the bathroom? Yes  Shower chair or bench in shower? No  Elevated toilet seat or a handicapped toilet? Yes   TIMED UP AND GO:  Was the test performed? No .   Cognitive Function: Normal cognitive status assessed by this Nurse Health Advisor. No abnormalities found.   MMSE - Mini Mental State Exam 11/14/2020 11/02/2019 10/27/2018 10/21/2017 10/07/2016  Orientation to time 5 5 5 5 5   Orientation to Place 5 5 5 5 5   Registration 3 3 3 3 3   Attention/ Calculation 5 5 0 0 0  Recall 3 3 3 3 3   Language- name 2 objects - - 0 0 0  Language- repeat 1 1 1 1 1    Language- follow 3 step command - - 3 3 3   Language- read & follow direction - - 0 0 0  Write a sentence - - 0 0 0  Copy design - - 0 0 0  Total score - - 20 20 20      6CIT Screen 11/15/2021  What Year? 0 points  What month? 0 points  What time? 0 points  Count back from 20 0 points  Months in reverse 0 points  Repeat phrase 2 points  Total Score 2    Immunizations Immunization History  Administered Date(s) Administered   DTaP 04/30/2004   Influenza, High Dose Seasonal PF 06/17/2014   Influenza,inj,Quad PF,6+ Mos 06/22/2013   Influenza-Unspecified 06/26/2015, 06/25/2017, 06/24/2019, 06/16/2020, 06/21/2021   Moderna Sars-Covid-2 Vaccination 10/07/2019, 10/28/2019, 07/28/2020   Pfizer Covid-19 Vaccine Bivalent Booster 33yrs & up 06/07/2021   Pneumococcal Conjugate-13 09/29/2014   Pneumococcal Polysaccharide-23 06/01/2012   Varicella 08/06/2007    TDAP status: Due, Education has been provided regarding the importance of this vaccine. Advised may receive this vaccine at local pharmacy or Health Dept. Aware to provide a copy of the vaccination record if obtained from local pharmacy or Health Dept. Verbalized acceptance and understanding.  Flu Vaccine status: Up to date  Pneumococcal vaccine status: Up to date  Covid-19 vaccine status: Completed vaccines  Qualifies for Shingles Vaccine? Yes   Zostavax completed No   Shingrix Completed?: No.    Education has been provided regarding the importance of this vaccine. Patient has been advised to call insurance company to determine out of pocket expense if they have not yet received this vaccine. Advised may also receive vaccine at local pharmacy or Health Dept. Verbalized acceptance and understanding.  Screening Tests Health Maintenance  Topic Date Due   Zoster Vaccines- Shingrix (1 of 2) Never done   DEXA SCAN  06/02/2017   TETANUS/TDAP  04/29/2024 (Originally 04/30/2014)   Pneumonia Vaccine 13+ Years old  Completed   INFLUENZA  VACCINE  Completed   COVID-19 Vaccine  Completed   HPV VACCINES  Aged Out    Health Maintenance  Health Maintenance Due  Topic Date Due   Zoster Vaccines- Shingrix (1 of 2) Never done   DEXA SCAN  06/02/2017    Colorectal cancer screening: No longer required.   Mammogram status: No longer required due to age.  Bone density status: No longer required due to age  Lung Cancer Screening: (Low Dose CT Chest recommended if Age 41-80 years, 30 pack-year currently smoking OR have quit w/in 15years.) does qualify.    Additional Screening:  Hepatitis C Screening: does not qualify  Vision Screening: Recommended annual ophthalmology exams for early detection of glaucoma and other disorders of the eye. Is the patient up to date with their annual eye exam?  Yes  Who is the provider or what is the name of the office in which the patient attends annual eye exams? Dr. Thomasene Ripple   Dental Screening: Recommended annual dental exams for proper oral hygiene  Community Resource Referral / Chronic Care Management: CRR required this visit?  No   CCM required this visit?  No      Plan:     I have personally reviewed and noted the following in the patients chart:   Medical and social history Use of alcohol, tobacco or illicit drugs  Current medications and supplements including opioid prescriptions.  Functional ability and status Nutritional status Physical activity Advanced directives List of other physicians Hospitalizations, surgeries, and ER visits in previous 12 months Vitals Screenings to include cognitive, depression, and falls Referrals and appointments  In addition, I have reviewed and discussed with patient certain preventive protocols, quality metrics, and best  practice recommendations. A written personalized care plan for preventive services as well as general preventive health recommendations were provided to patient.   Due to this being a telephonic visit, the after  visit summary with patients personalized plan was offered to patient via mail or my-chart. Patient would like to access on my-chart.    Loma Messing, LPN  02/18/5373   Nurse Health Advisor  Nurse Notes: none

## 2021-11-15 ENCOUNTER — Ambulatory Visit (INDEPENDENT_AMBULATORY_CARE_PROVIDER_SITE_OTHER): Payer: PPO

## 2021-11-15 VITALS — Ht 68.0 in | Wt 136.0 lb

## 2021-11-15 DIAGNOSIS — Z0000 Encounter for general adult medical examination without abnormal findings: Secondary | ICD-10-CM

## 2021-11-15 NOTE — Patient Instructions (Signed)
[name] , Thank you for taking time to complete your Medicare Wellness Visit. I appreciate your ongoing commitment to your health goals. Please review the following plan we discussed and let me know if I can assist you in the future.   Screening recommendations/referrals: Colonoscopy: no longer required  Mammogram: per our conversation you no longer do this screening Bone Density: no longer required  Recommended yearly ophthalmology/optometry visit for glaucoma screening and checkup Recommended yearly dental visit for hygiene and checkup  Vaccinations: Influenza vaccine: up to date  Pneumococcal vaccine: up to date  Tdap vaccine: Medicare may cover in the event that you are cut or injured  Shingles vaccine: Discuss with pharmacy Covid-19:up to date   Advanced directives: copy on file  Conditions/risks identified: see problem list   Next appointment: Follow up in one year for your annual wellness visit    Preventive Care 65 Years and Older, Female Preventive care refers to lifestyle choices and visits with your health care provider that can promote health and wellness. What does preventive care include? A yearly physical exam. This is also called an annual well check. Dental exams once or twice a year. Routine eye exams. Ask your health care provider how often you should have your eyes checked. Personal lifestyle choices, including: Daily care of your teeth and gums. Regular physical activity. Eating a healthy diet. Avoiding tobacco and drug use. Limiting alcohol use. Practicing safe sex. Taking low-dose aspirin every day. Taking vitamin and mineral supplements as recommended by your health care provider. What happens during an annual well check? The services and screenings done by your health care provider during your annual well check will depend on your age, overall health, lifestyle risk factors, and family history of disease. Counseling  Your health care provider may  ask you questions about your: Alcohol use. Tobacco use. Drug use. Emotional well-being. Home and relationship well-being. Sexual activity. Eating habits. History of falls. Memory and ability to understand (cognition). Work and work Statistician. Reproductive health. Screening  You may have the following tests or measurements: Height, weight, and BMI. Blood pressure. Lipid and cholesterol levels. These may be checked every 5 years, or more frequently if you are over 77 years old. Skin check. Lung cancer screening. You may have this screening every year starting at age 19 if you have a 30-pack-year history of smoking and currently smoke or have quit within the past 15 years. Fecal occult blood test (FOBT) of the stool. You may have this test every year starting at age 101. Flexible sigmoidoscopy or colonoscopy. You may have a sigmoidoscopy every 5 years or a colonoscopy every 10 years starting at age 50. Hepatitis C blood test. Hepatitis B blood test. Sexually transmitted disease (STD) testing. Diabetes screening. This is done by checking your blood sugar (glucose) after you have not eaten for a while (fasting). You may have this done every 1-3 years. Bone density scan. This is done to screen for osteoporosis. You may have this done starting at age 37. Mammogram. This may be done every 1-2 years. Talk to your health care provider about how often you should have regular mammograms. Talk with your health care provider about your test results, treatment options, and if necessary, the need for more tests. Vaccines  Your health care provider may recommend certain vaccines, such as: Influenza vaccine. This is recommended every year. Tetanus, diphtheria, and acellular pertussis (Tdap, Td) vaccine. You may need a Td booster every 10 years. Zoster vaccine. You may need  this after age 91. Pneumococcal 13-valent conjugate (PCV13) vaccine. One dose is recommended after age 39. Pneumococcal  polysaccharide (PPSV23) vaccine. One dose is recommended after age 16. Talk to your health care provider about which screenings and vaccines you need and how often you need them. This information is not intended to replace advice given to you by your health care provider. Make sure you discuss any questions you have with your health care provider. Document Released: 09/29/2015 Document Revised: 05/22/2016 Document Reviewed: 07/04/2015 Elsevier Interactive Patient Education  2017 [employer] Prevention in the Home Falls can cause injuries. They can happen to people of all ages. There are many things you can do to make your home safe and to help prevent falls. What can I do on the outside of my home? Regularly fix the edges of walkways and driveways and fix any cracks. Remove anything that might make you trip as you walk through a door, such as a raised step or threshold. Trim any bushes or trees on the path to your home. Use bright outdoor lighting. Clear any walking paths of anything that might make someone trip, such as rocks or tools. Regularly check to see if handrails are loose or broken. Make sure that both sides of any steps have handrails. Any raised decks and porches should have guardrails on the edges. Have any leaves, snow, or ice cleared regularly. Use sand or salt on walking paths during winter. Clean up any spills in your garage right away. This includes oil or grease spills. What can I do in the bathroom? Use night lights. Install grab bars by the toilet and in the tub and shower. Do not use towel bars as grab bars. Use non-skid mats or decals in the tub or shower. If you need to sit down in the shower, use a plastic, non-slip stool. Keep the floor dry. Clean up any water that spills on the floor as soon as it happens. Remove soap buildup in the tub or shower regularly. Attach bath mats securely with double-sided non-slip rug tape. Do not have throw rugs and other  things on the floor that can make you trip. What can I do in the bedroom? Use night lights. Make sure that you have a light by your bed that is easy to reach. Do not use any sheets or blankets that are too big for your bed. They should not hang down onto the floor. Have a firm chair that has side arms. You can use this for support while you get dressed. Do not have throw rugs and other things on the floor that can make you trip. What can I do in the kitchen? Clean up any spills right away. Avoid walking on wet floors. Keep items that you use a lot in easy-to-reach places. If you need to reach something above you, use a strong step stool that has a grab bar. Keep electrical cords out of the way. Do not use floor polish or wax that makes floors slippery. If you must use wax, use non-skid floor wax. Do not have throw rugs and other things on the floor that can make you trip. What can I do with my stairs? Do not leave any items on the stairs. Make sure that there are handrails on both sides of the stairs and use them. Fix handrails that are broken or loose. Make sure that handrails are as long as the stairways. Check any carpeting to make sure that it is firmly attached to  the stairs. Fix any carpet that is loose or worn. Avoid having throw rugs at the top or bottom of the stairs. If you do have throw rugs, attach them to the floor with carpet tape. Make sure that you have a light switch at the top of the stairs and the bottom of the stairs. If you do not have them, ask someone to add them for you. What else can I do to help prevent falls? Wear shoes that: Do not have high heels. Have rubber bottoms. Are comfortable and fit you well. Are closed at the toe. Do not wear sandals. If you use a stepladder: Make sure that it is fully opened. Do not climb a closed stepladder. Make sure that both sides of the stepladder are locked into place. Ask someone to hold it for you, if possible. Clearly  mark and make sure that you can see: Any grab bars or handrails. First and last steps. Where the edge of each step is. Use tools that help you move around (mobility aids) if they are needed. These include: Canes. Walkers. Scooters. Crutches. Turn on the lights when you go into a dark area. Replace any light bulbs as soon as they burn out. Set up your furniture so you have a clear path. Avoid moving your furniture around. If any of your floors are uneven, fix them. If there are any pets around you, be aware of where they are. Review your medicines with your doctor. Some medicines can make you feel dizzy. This can increase your chance of falling. Ask your doctor what other things that you can do to help prevent falls. This information is not intended to replace advice given to you by your health care provider. Make sure you discuss any questions you have with your health care provider. Document Released: 06/29/2009 Document Revised: 02/08/2016 Document Reviewed: 10/07/2014 Elsevier Interactive Patient Education  2017 [employer].

## 2021-11-22 ENCOUNTER — Other Ambulatory Visit: Payer: Self-pay | Admitting: Family Medicine

## 2021-11-22 MED ORDER — GABAPENTIN 100 MG PO CAPS: ORAL_CAPSULE | ORAL | 1 refills | Status: DC

## 2021-11-22 NOTE — Telephone Encounter (Signed)
?  Encourage patient to contact the pharmacy for refills or they can request refills through [facility] ? ?Did the patient contact the pharmacy:  Y ? ? ?LAST APPOINTMENT DATE:  5.3.22 ? ?NEXT APPOINTMENT DATE: 4.25.23 ? ?MEDICATION: gabapentin (NEURONTIN) 100 MG capsule ? ?Is the patient out of medication? N  ? ?If not, how much is left? (1 week) ? ?Is this a 90 day supply:  ? ?PHARMACY:  [id number] [place]:  [phone] ?Fax:  [phone] ?  ? ? ?Let patient know to contact pharmacy at the end of the day to make sure medication is ready. ? ?Please notify patient to allow 48-72 hours to process ?  ?

## 2021-11-22 NOTE — Telephone Encounter (Signed)
Last office visit 01/16/21 for polyneuropathy and incontontience.  Last refilled 05/22/21 for #[id number] with 1 refill.  CPE scheduled 01/08/22.  ?

## 2021-11-29 ENCOUNTER — Other Ambulatory Visit: Payer: Self-pay

## 2021-11-29 MED ORDER — FLUTICASONE-SALMETEROL 100-50 MCG/ACT IN AEPB: 1.0000 | INHALATION_SPRAY | Freq: Two times a day (BID) | RESPIRATORY_TRACT | 5 refills | Status: DC

## 2021-11-29 NOTE — Telephone Encounter (Signed)
Advair 100 sent to preferred pharmacy.  ?

## 2021-12-06 ENCOUNTER — Ambulatory Visit (INDEPENDENT_AMBULATORY_CARE_PROVIDER_SITE_OTHER): Payer: PPO

## 2021-12-06 DIAGNOSIS — Z7901 Long term (current) use of anticoagulants: Secondary | ICD-10-CM

## 2021-12-06 DIAGNOSIS — D802 Selective deficiency of immunoglobulin A [IgA]: Secondary | ICD-10-CM | POA: Diagnosis not present

## 2021-12-06 DIAGNOSIS — I482 Chronic atrial fibrillation, unspecified: Secondary | ICD-10-CM | POA: Diagnosis not present

## 2021-12-06 LAB — POCT INR: INR: 3.7 — AB (ref 2.0–3.0)

## 2021-12-06 NOTE — Progress Notes (Signed)
Received fax with results from [facility] for 3/23 for INR of 3.7 and 34.8 for PTT. Sent email to advise [facility] of next testing date.  ?Hold dose today and then change weekly dose to take 1 tablet daily except take 1/2 tablet on Sundays and Thursdays. Recheck in 3 weeks.  ?Left detailed msg per pt request during last phone call. Pt does not want AVS mailed.  ?

## 2021-12-06 NOTE — Patient Instructions (Addendum)
Pre visit review using our clinic review tool, if applicable. No additional management support is needed unless otherwise documented below in the visit note. ? ?Hold dose today and then change weekly dose to take 1 tablet daily except take 1/2 tablet on Sundays and Thursdays. Recheck in 3 weeks.  ?

## 2021-12-11 ENCOUNTER — Telehealth: Payer: Self-pay | Admitting: Family Medicine

## 2021-12-11 NOTE — Telephone Encounter (Signed)
Tried to contact pt but no answer and no VM.  

## 2021-12-11 NOTE — Telephone Encounter (Signed)
Is this on all her mediation?  Is patient aware insurance will not cover Brand name and she will have to pay out of pocket? ?

## 2021-12-11 NOTE — Telephone Encounter (Signed)
LVM

## 2021-12-11 NOTE — Telephone Encounter (Signed)
Pt called asking for a call back to discuss her medication and pharmacies. Please advise. ?

## 2021-12-11 NOTE — Telephone Encounter (Signed)
Pt reports she noticed her warfarin she picked up the last time was from a different manufacturer and wanted to know if this may have caused her INR to be elevated. Advised there can be some variations in general medication that could effect the INR. Advised we will check her INR next time and see if the the INR is still elevated and see if she needs to go back to her prior manufacturer.  ?She also wants to request that all future refills be sent in as name brand only. She is aware coumadin is no longer available but she would like any other medication refill sent with a note that pt wants name brand only. She reports the  pharmacy is changing all her meds to generic and she does not want generic anymore. Advised the pharmacy can legally change scripts to generic unless it is noted on the script that the med should be dispensed as written. Advised a msg would be sent to PCP and her CMA so they are aware when refills are needed. Pt does not need any refills currently. Advised if any other problems with the pharmacy to contact this nurse or the clinic. Pt verbalized understanding and was appreciative.  ?

## 2021-12-12 NOTE — Telephone Encounter (Signed)
Pt has been educated that medication may not be covered. She is not needing any refills at this time but wanted this message passed on for future refills.  ?

## 2021-12-12 NOTE — Telephone Encounter (Signed)
Noted  

## 2021-12-19 ENCOUNTER — Telehealth: Payer: Self-pay | Admitting: Family Medicine

## 2021-12-19 DIAGNOSIS — E039 Hypothyroidism, unspecified: Secondary | ICD-10-CM

## 2021-12-19 DIAGNOSIS — E782 Mixed hyperlipidemia: Secondary | ICD-10-CM

## 2021-12-19 NOTE — Telephone Encounter (Signed)
-----   Message from Velna Hatchet, RT sent at 12/17/2021 10:01 AM EDT ----- ?Regarding: Lab orders for appt on Tuesday, 01/01/2022 ?Patient is scheduled for cpx, please order future labs.  Thanks, Anda Kraft  ? ?

## 2021-12-27 ENCOUNTER — Ambulatory Visit (INDEPENDENT_AMBULATORY_CARE_PROVIDER_SITE_OTHER): Payer: PPO

## 2021-12-27 DIAGNOSIS — Z7901 Long term (current) use of anticoagulants: Secondary | ICD-10-CM

## 2021-12-27 DIAGNOSIS — I482 Chronic atrial fibrillation, unspecified: Secondary | ICD-10-CM | POA: Diagnosis not present

## 2021-12-27 DIAGNOSIS — D802 Selective deficiency of immunoglobulin A [IgA]: Secondary | ICD-10-CM | POA: Diagnosis not present

## 2021-12-27 LAB — POCT INR: INR: 2.2 (ref 2.0–3.0)

## 2021-12-27 NOTE — Patient Instructions (Addendum)
Pre visit review using our clinic review tool, if applicable. No additional management support is needed unless otherwise documented below in the visit note. ? ?Continue 1 tablet daily except take 1/2 tablet on Sundays and Thursdays. Recheck in 3 weeks.  ?

## 2021-12-27 NOTE — Progress Notes (Signed)
Received fax with results from [facility] for 4/13 for INR of 2.2 and 20.6 for PTT. Sent email to advise [facility] of next testing date.  ?Continue 1 tablet daily except take 1/2 tablet on Sundays and Thursdays. Recheck in 4 weeks.  ?Left detailed msg per pt request during last phone call. Pt does not want AVS mailed.  ?

## 2022-01-01 ENCOUNTER — Other Ambulatory Visit (INDEPENDENT_AMBULATORY_CARE_PROVIDER_SITE_OTHER): Payer: PPO

## 2022-01-01 DIAGNOSIS — E039 Hypothyroidism, unspecified: Secondary | ICD-10-CM | POA: Diagnosis not present

## 2022-01-01 DIAGNOSIS — E782 Mixed hyperlipidemia: Secondary | ICD-10-CM | POA: Diagnosis not present

## 2022-01-01 LAB — COMPREHENSIVE METABOLIC PANEL
ALT: 24 U/L (ref 0–35)
AST: 29 U/L (ref 0–37)
Albumin: 4.3 g/dL (ref 3.5–5.2)
Alkaline Phosphatase: 109 U/L (ref 39–117)
BUN: 14 mg/dL (ref 6–23)
CO2: 30 mEq/L (ref 19–32)
Calcium: 9.6 mg/dL (ref 8.4–10.5)
Chloride: 100 mEq/L (ref 96–112)
Creatinine, Ser: 0.67 mg/dL (ref 0.40–1.20)
GFR: 78.25 mL/min (ref >60.00)
Glucose, Bld: 79 mg/dL (ref 70–99)
Potassium: 4 mEq/L (ref 3.5–5.1)
Sodium: 137 mEq/L (ref 135–145)
Total Bilirubin: 0.9 mg/dL (ref 0.2–1.2)
Total Protein: 7 g/dL (ref 6.0–8.3)

## 2022-01-01 LAB — LIPID PANEL
Cholesterol: 228 mg/dL — ABNORMAL HIGH (ref 0–200)
HDL: 75.1 mg/dL (ref >39.00)
LDL Cholesterol: 127 mg/dL — ABNORMAL HIGH (ref 0–99)
NonHDL: 153.23
Total CHOL/HDL Ratio: 3
Triglycerides: 131 mg/dL (ref 0.0–149.0)
VLDL: 26.2 mg/dL (ref 0.0–40.0)

## 2022-01-01 LAB — T4, FREE: Free T4: 1.19 ng/dL (ref 0.60–1.60)

## 2022-01-01 LAB — TSH: TSH: 1.69 u[IU]/mL (ref 0.35–5.50)

## 2022-01-01 LAB — T3, FREE: T3, Free: 3 pg/mL (ref 2.3–4.2)

## 2022-01-01 NOTE — Progress Notes (Signed)
No critical labs need to be addressed urgently. We will discuss labs in detail at upcoming office visit.   

## 2022-01-08 ENCOUNTER — Encounter: Payer: Self-pay | Admitting: Family Medicine

## 2022-01-08 ENCOUNTER — Ambulatory Visit (INDEPENDENT_AMBULATORY_CARE_PROVIDER_SITE_OTHER): Payer: PPO | Admitting: Family Medicine

## 2022-01-08 VITALS — BP 120/78 | HR 79 | Temp 97.9°F | Resp 16 | Ht 68.0 in | Wt 135.1 lb

## 2022-01-08 DIAGNOSIS — Z66 Do not resuscitate: Secondary | ICD-10-CM

## 2022-01-08 DIAGNOSIS — J449 Chronic obstructive pulmonary disease, unspecified: Secondary | ICD-10-CM | POA: Diagnosis not present

## 2022-01-08 DIAGNOSIS — Z0000 Encounter for general adult medical examination without abnormal findings: Secondary | ICD-10-CM

## 2022-01-08 DIAGNOSIS — M8580 Other specified disorders of bone density and structure, unspecified site: Secondary | ICD-10-CM

## 2022-01-08 DIAGNOSIS — I4891 Unspecified atrial fibrillation: Secondary | ICD-10-CM

## 2022-01-08 DIAGNOSIS — J432 Centrilobular emphysema: Secondary | ICD-10-CM

## 2022-01-08 DIAGNOSIS — E039 Hypothyroidism, unspecified: Secondary | ICD-10-CM

## 2022-01-08 DIAGNOSIS — E782 Mixed hyperlipidemia: Secondary | ICD-10-CM | POA: Diagnosis not present

## 2022-01-08 NOTE — Assessment & Plan Note (Signed)
Stable, chronic.  Continue current medication. ? ? ?Levothyroxine 75 mcg daily ?

## 2022-01-08 NOTE — Assessment & Plan Note (Signed)
Chronic, poorly controlled ? ?Cholesterol is above goal especially since she has started eating ice cream.  I have encouraged her to continue working on healthy protein intake to keep weight stable.  She will try to make lower cholesterol choices. ?Medication not indicated given age ?

## 2022-01-08 NOTE — Assessment & Plan Note (Signed)
Chronic, Controlled with Advair 100 mg once daily.  Followed by pulmonary Dr. Stoney Bang.  Last office visit note reviewed ?

## 2022-01-08 NOTE — Assessment & Plan Note (Signed)
Chronic, stable ? ?Followed by Dr. Rockey Situ.  Last office visit note reviewed.  On Coumadin for anticoagulation ?

## 2022-01-08 NOTE — Assessment & Plan Note (Signed)
Chronic ? ?Likely at this point she has progressed to osteoporosis but has not had a bone density to improve this.  She will consider moving forward with bone density but given she is hesitant about adding an additional medication she may decide to hold further testing.  Reviewed pros and cons in detail. ?

## 2022-01-08 NOTE — Progress Notes (Signed)
? ? Patient ID: [name], female    DOB: [birth year], 88 y.o.   MRN: [id number] ? ?This visit was conducted in person. ? ?BP 120/78   Pulse 79   Temp 97.9 ?F (36.6 ?C)   Resp 16   Ht '5\' 8"'$  (1.727 m)   Wt 135 lb 2 oz (61.3 kg)   SpO2 (!) 82%   BMI 20.55 kg/m?   ? Rechecked oxygen in  99% ?CC: ?Chief Complaint  ?Patient presents with  ? Annual Exam  ? ? ?Subjective:  ? ?HPI: ?[name] is a 88 y.o. female presenting on 01/08/2022 for Annual Exam ? ?The patient presents for  complete physical and review of chronic health problems. He/She also has the following acute concerns today: none ? ?The patient saw a LPN or RN for medicare wellness visit. ? Prevention and wellness was reviewed in detail. ?Note reviewed and important notes copied below. ? ?MOST form renewed and reviewed with patient in detail ? ?COPD/emphysema: Controlled with Advair 100 mg once daily.  Followed by pulmonary Dr. Stoney Bang.  Last office visit note reviewed ? ?Hypothyroidism stable control on levothyroxine 75 mcg daily ? ?Atrial fibrillation: Followed by Dr. Rockey Situ.  Last office visit note reviewed.  On Coumadin for anticoagulation ? ?Chronic leg pain and neuropathy: Followed by neurology and podiatry.  On Neurontin with tolerable control, no pain in daytime. ?History of venous insufficiency ?   ?Elevated Cholesterol: LDL not at goal but given patient age she is not on a statin medication to avoid side effects. ?Lab Results  ?Component Value Date  ? CHOL 228 (H) 01/01/2022  ? HDL 75.10 01/01/2022  ? LDLCALC 127 (H) 01/01/2022  ? LDLDIRECT 126.5 07/08/2013  ? TRIG 131.0 01/01/2022  ? CHOLHDL 3 01/01/2022  ?Using medications without problems: ?Muscle aches:  ?Diet compliance: Heart healthy.. does eat more ice cream. ?Exercise: Regular ?Other complaints: ?Wt Readings from Last 3 Encounters:  ?01/08/22 135 lb 2 oz (61.3 kg)  ?11/15/21 136 lb (61.7 kg)  ?07/10/21 136 lb (61.7 kg)  ?Body mass index is 20.55 kg/m?. ? ? ? ?Relevant past medical,  surgical, family and social history reviewed and updated as indicated. Interim medical history since our last visit reviewed. ?Allergies and medications reviewed and updated. ?Outpatient Medications Prior to Visit  ?Medication Sig Dispense Refill  ? fluticasone-salmeterol (ADVAIR) 100-50 MCG/ACT AEPB Inhale 1 puff into the lungs 2 (two) times daily. 60 each 5  ? gabapentin (NEURONTIN) 100 MG capsule TAKE 3 CAPSULES(300 MG) BY MOUTH AT BEDTIME 270 capsule 1  ? ipratropium (ATROVENT) 0.03 % nasal spray PLACE 2 SPRAYS IN EACH NOSTRIL THREE TIMES DAILY AS NEEDED PRIOR TO ACTIVITIES THATS CAUSE NOSE TO RUN 30 mL 5  ? levothyroxine (SYNTHROID) 75 MCG tablet TAKE 1 TABLET BY MOUTH 30 MINUTES BEFORE EATING WITH A FULL GLASS OF WATER 90 tablet 0  ? verapamil (CALAN-SR) 120 MG CR tablet TAKE 1 TABLET(120 MG) BY MOUTH DAILY 90 tablet 3  ? warfarin (COUMADIN) 5 MG tablet TAKE 1 TABLET BY MOUTH DAILY EXCEPT TAKE 1/2 TABLET ON SUNDAYS OR AS DIRECTED BY ANTICOAGULATION CLINIC 100 tablet 1  ? Fluticasone-Salmeterol (ADVAIR) 100-50 MCG/DOSE AEPB INHALE 1 PUFF BY MOUTH TWICE DAILY (Patient not taking: Reported on 01/08/2022) 180 each 2  ? ?No facility-administered medications prior to visit.  ?  ? ?Per HPI unless specifically indicated in ROS section below ?Review of Systems  ?Constitutional:  Negative for fatigue and fever.  ?HENT:  Negative for congestion.   ?  Eyes:  Negative for pain.  ?Respiratory:  Negative for cough and shortness of breath.   ?Cardiovascular:  Negative for chest pain, palpitations and leg swelling.  ?Gastrointestinal:  Negative for abdominal pain.  ?Genitourinary:  Negative for dysuria and vaginal bleeding.  ?Musculoskeletal:  Negative for back pain.  ?Neurological:  Negative for syncope, light-headedness and headaches.  ?Psychiatric/Behavioral:  Negative for dysphoric mood.   ?Objective:  ?BP 120/78   Pulse 79   Temp 97.9 ?F (36.6 ?C)   Resp 16   Ht '5\' 8"'$  (1.727 m)   Wt 135 lb 2 oz (61.3 kg)   SpO2 (!) 82%    BMI 20.55 kg/m?   ?Wt Readings from Last 3 Encounters:  ?01/08/22 135 lb 2 oz (61.3 kg)  ?11/15/21 136 lb (61.7 kg)  ?07/10/21 136 lb (61.7 kg)  ?  ?  ?Physical Exam ?Constitutional:   ?   General: She is not in acute distress. ?   Appearance: Normal appearance. She is well-developed. She is not ill-appearing or toxic-appearing.  ?HENT:  ?   Head: Normocephalic.  ?   Right Ear: Hearing, tympanic membrane, ear canal and external ear normal. Tympanic membrane is not erythematous, retracted or bulging.  ?   Left Ear: Hearing, tympanic membrane, ear canal and external ear normal. Tympanic membrane is not erythematous, retracted or bulging.  ?   Nose: No mucosal edema or rhinorrhea.  ?   Right Sinus: No maxillary sinus tenderness or frontal sinus tenderness.  ?   Left Sinus: No maxillary sinus tenderness or frontal sinus tenderness.  ?   Mouth/Throat:  ?   Pharynx: Uvula midline.  ?Eyes:  ?   General: Lids are normal. Lids are everted, no foreign bodies appreciated.  ?   Conjunctiva/sclera: Conjunctivae normal.  ?   Pupils: Pupils are equal, round, and reactive to light.  ?Neck:  ?   Thyroid: No thyroid mass or thyromegaly.  ?   Vascular: No carotid bruit.  ?   Trachea: Trachea normal.  ?Cardiovascular:  ?   Rate and Rhythm: Normal rate and regular rhythm.  ?   Pulses: Normal pulses.  ?   Heart sounds: Normal heart sounds, S1 normal and S2 normal. No murmur heard. ?  No friction rub. No gallop.  ?Pulmonary:  ?   Effort: Pulmonary effort is normal. No tachypnea or respiratory distress.  ?   Breath sounds: Normal breath sounds. No decreased breath sounds, wheezing, rhonchi or rales.  ?Abdominal:  ?   General: Bowel sounds are normal.  ?   Palpations: Abdomen is soft.  ?   Tenderness: There is no abdominal tenderness.  ?Musculoskeletal:  ?   Cervical back: Normal range of motion and neck supple.  ?Skin: ?   General: Skin is warm and dry.  ?   Findings: No rash.  ?Neurological:  ?   Mental Status: She is alert.   ?Psychiatric:     ?   Mood and Affect: Mood is not anxious or depressed.     ?   Speech: Speech normal.     ?   Behavior: Behavior normal. Behavior is cooperative.     ?   Thought Content: Thought content normal.     ?   Judgment: Judgment normal.  ? ?   ?Results for orders placed or performed in visit on 01/01/22  ?T3, free  ?Result Value Ref Range  ? T3, Free 3.0 2.3 - 4.2 pg/mL  ?T4, free  ?Result Value Ref Range  ?  Free T4 1.19 0.60 - 1.60 ng/dL  ?TSH  ?Result Value Ref Range  ? TSH 1.69 0.35 - 5.50 uIU/mL  ?Comprehensive metabolic panel  ?Result Value Ref Range  ? Sodium 137 135 - 145 mEq/L  ? Potassium 4.0 3.5 - 5.1 mEq/L  ? Chloride 100 96 - 112 mEq/L  ? CO2 30 19 - 32 mEq/L  ? Glucose, Bld 79 70 - 99 mg/dL  ? BUN 14 6 - 23 mg/dL  ? Creatinine, Ser 0.67 0.40 - 1.20 mg/dL  ? Total Bilirubin 0.9 0.2 - 1.2 mg/dL  ? Alkaline Phosphatase 109 39 - 117 U/L  ? AST 29 0 - 37 U/L  ? ALT 24 0 - 35 U/L  ? Total Protein 7.0 6.0 - 8.3 g/dL  ? Albumin 4.3 3.5 - 5.2 g/dL  ? GFR 78.25 >60.00 mL/min  ? Calcium 9.6 8.4 - 10.5 mg/dL  ?Lipid panel  ?Result Value Ref Range  ? Cholesterol 228 (H) 0 - 200 mg/dL  ? Triglycerides 131.0 0.0 - 149.0 mg/dL  ? HDL 75.10 >39.00 mg/dL  ? VLDL 26.2 0.0 - 40.0 mg/dL  ? LDL Cholesterol 127 (H) 0 - 99 mg/dL  ? Total CHOL/HDL Ratio 3   ? NonHDL 153.23   ? ? ?This visit occurred during the SARS-CoV-2 public health emergency.  Safety protocols were in place, including screening questions prior to the visit, additional usage of staff PPE, and extensive cleaning of exam room while observing appropriate contact time as indicated for disinfecting solutions.  ? ?COVID 19 screen:  No recent travel or known exposure to [place] ?The patient denies respiratory symptoms of COVID 19 at this time. ?The importance of social distancing was discussed today.  ? ?Assessment and Plan ?The patient's preventative maintenance and recommended screening tests for an annual wellness exam were reviewed in full  today. ?Brought up to date unless services declined. ? ?Counselled on the importance of diet, exercise, and its role in overall health and mortality. ?The patient's FH and SH was reviewed, including their home life, tobacco status, a

## 2022-01-08 NOTE — Patient Instructions (Addendum)
Consider Shingrix vaccine and bone density. ? ? ? ?

## 2022-01-08 NOTE — Assessment & Plan Note (Signed)
Reviewed MOST form and detail with patient.  Completed updates no changes made.  Signed by myself and patient. ?

## 2022-01-10 ENCOUNTER — Other Ambulatory Visit: Payer: Self-pay | Admitting: Family Medicine

## 2022-01-10 NOTE — Telephone Encounter (Signed)
Patient stated she asked for the medication below to be filled on Tuesday at her visit, called [facility] and it is not ready. ? ?Patient has enough meds to make it until Tuesday, but would like to avoid calling [facility] and our office. ? ?levothyroxine (SYNTHROID) 75 MCG tablet ? ? ? ?[id number] [place]  [phone] ?Fax:  [phone] ?  ? ?

## 2022-01-24 ENCOUNTER — Encounter: Payer: Self-pay | Admitting: Family Medicine

## 2022-01-24 ENCOUNTER — Ambulatory Visit (INDEPENDENT_AMBULATORY_CARE_PROVIDER_SITE_OTHER): Payer: PPO | Admitting: Family Medicine

## 2022-01-24 VITALS — BP 116/68 | HR 83 | Ht 68.0 in | Wt 137.0 lb

## 2022-01-24 DIAGNOSIS — R296 Repeated falls: Secondary | ICD-10-CM

## 2022-01-24 DIAGNOSIS — M2041 Other hammer toe(s) (acquired), right foot: Secondary | ICD-10-CM | POA: Diagnosis not present

## 2022-01-24 DIAGNOSIS — M2042 Other hammer toe(s) (acquired), left foot: Secondary | ICD-10-CM | POA: Diagnosis not present

## 2022-01-24 DIAGNOSIS — I482 Chronic atrial fibrillation, unspecified: Secondary | ICD-10-CM | POA: Diagnosis not present

## 2022-01-24 DIAGNOSIS — D802 Selective deficiency of immunoglobulin A [IgA]: Secondary | ICD-10-CM | POA: Diagnosis not present

## 2022-01-24 DIAGNOSIS — Z7901 Long term (current) use of anticoagulants: Secondary | ICD-10-CM | POA: Diagnosis not present

## 2022-01-24 DIAGNOSIS — L84 Corns and callosities: Secondary | ICD-10-CM | POA: Insufficient documentation

## 2022-01-24 DIAGNOSIS — G629 Polyneuropathy, unspecified: Secondary | ICD-10-CM | POA: Diagnosis not present

## 2022-01-24 LAB — POCT INR: INR: 2.3 (ref 2.0–3.0)

## 2022-01-24 NOTE — Assessment & Plan Note (Signed)
The scabs on bilateral second and third DIP joints of feet are most likely due to rubbing from hammertoes.  She will start wearing corn pads. ? ? ?

## 2022-01-24 NOTE — Patient Instructions (Addendum)
Try  corn pad with central aperture ( hole) ? Start physical therapy. ? Continue compression hose. ?Call if pain in toes or cold toes. ?

## 2022-01-24 NOTE — Assessment & Plan Note (Signed)
Improved control at night with gabapentin 300 mg p.o. nightly.  She will continue at this dose ?

## 2022-01-24 NOTE — Progress Notes (Signed)
? ? Patient ID: [name], female    DOB: 09-20-1931, 88 y.o.   MRN: [id number] ? ?This visit was conducted in person. ? ?BP 116/68   Pulse 83   Ht '5\' 8"'$  (1.727 m)   Wt 137 lb (62.1 kg)   SpO2 93%   BMI 20.83 kg/m?   ? ?CC: ?Chief Complaint  ?Patient presents with  ? Foot Pain  ?  Having pain in feet  , having numbness and burning,  using insoles has help , using medication pain   ? ? ?Subjective:  ? ?HPI: ?[name] is a 88 y.o. female with a history of hypothyroidism, polyneuropathy, COPD, on anticoagulation for atrial fibrillation presenting on 01/24/2022 for Foot Pain (Having pain in feet  , having numbness and burning,  using insoles has help , using medication pain ) ? ?Chronic bilateral neuropathy ?At physical on 01/08/2022 she had noted that she has chronic sores on her feet. ? ?12/17/2016 ABIs showed calcification on the left causing noncompressible arteries and normal right foot ?Office visit note from 5/18/12018 of visit with Dr. Lucky Cowboy, vascular ?Discussed chronic venous insufficiency but no mention of peripheral artery disease. ?  She always wears compression hose on feet. ? ?She has tried gabapentin 300 mg at night and no longer wakes with pain, go sleep sleep easier. ? ?She is not interested in repeat bone density. ? ? She continues to have issues with balance from the neuropathy and would like to restart PT. ? ?Relevant past medical, surgical, family and social history reviewed and updated as indicated. Interim medical history since our last visit reviewed. ?Allergies and medications reviewed and updated. ?Outpatient Medications Prior to Visit  ?Medication Sig Dispense Refill  ? fluticasone-salmeterol (ADVAIR) 100-50 MCG/ACT AEPB Inhale 1 puff into the lungs 2 (two) times daily. 60 each 5  ? Fluticasone-Salmeterol (ADVAIR) 100-50 MCG/DOSE AEPB INHALE 1 PUFF BY MOUTH TWICE DAILY 180 each 2  ? gabapentin (NEURONTIN) 100 MG capsule TAKE 3 CAPSULES(300 MG) BY MOUTH AT BEDTIME 270 capsule 1  ?  ipratropium (ATROVENT) 0.03 % nasal spray PLACE 2 SPRAYS IN EACH NOSTRIL THREE TIMES DAILY AS NEEDED PRIOR TO ACTIVITIES THATS CAUSE NOSE TO RUN 30 mL 5  ? levothyroxine (SYNTHROID) 75 MCG tablet TAKE 1 TABLET BY MOUTH 30 MINS BEFORE EATING WITH A FULL GLASS OF WATER 90 tablet 0  ? verapamil (CALAN-SR) 120 MG CR tablet TAKE 1 TABLET(120 MG) BY MOUTH DAILY 90 tablet 3  ? warfarin (COUMADIN) 5 MG tablet TAKE 1 TABLET BY MOUTH DAILY EXCEPT TAKE 1/2 TABLET ON SUNDAYS OR AS DIRECTED BY ANTICOAGULATION CLINIC 100 tablet 1  ? ?No facility-administered medications prior to visit.  ?  ? ?Per HPI unless specifically indicated in ROS section below ?Review of Systems  ?Constitutional:  Negative for fatigue and fever.  ?HENT:  Negative for congestion.   ?Eyes:  Negative for pain.  ?Respiratory:  Negative for cough and shortness of breath.   ?Cardiovascular:  Negative for chest pain, palpitations and leg swelling.  ?Gastrointestinal:  Negative for abdominal pain.  ?Genitourinary:  Negative for dysuria and vaginal bleeding.  ?Musculoskeletal:  Negative for back pain.  ?Neurological:  Negative for syncope, light-headedness and headaches.  ?Psychiatric/Behavioral:  Negative for dysphoric mood.   ?Objective:  ?BP 116/68   Pulse 83   Ht '5\' 8"'$  (1.727 m)   Wt 137 lb (62.1 kg)   SpO2 93%   BMI 20.83 kg/m?   ?Wt Readings from Last 3 Encounters:  ?  01/24/22 137 lb (62.1 kg)  ?01/08/22 135 lb 2 oz (61.3 kg)  ?11/15/21 136 lb (61.7 kg)  ?  ?  ?Physical Exam ?Constitutional:   ?   General: She is not in acute distress. ?   Appearance: Normal appearance. She is well-developed. She is not ill-appearing or toxic-appearing.  ?HENT:  ?   Head: Normocephalic.  ?   Right Ear: Hearing, tympanic membrane, ear canal and external ear normal. Tympanic membrane is not erythematous, retracted or bulging.  ?   Left Ear: Hearing, tympanic membrane, ear canal and external ear normal. Tympanic membrane is not erythematous, retracted or bulging.  ?   Nose:  No mucosal edema or rhinorrhea.  ?   Right Sinus: No maxillary sinus tenderness or frontal sinus tenderness.  ?   Left Sinus: No maxillary sinus tenderness or frontal sinus tenderness.  ?   Mouth/Throat:  ?   Pharynx: Uvula midline.  ?Eyes:  ?   General: Lids are normal. Lids are everted, no foreign bodies appreciated.  ?   Conjunctiva/sclera: Conjunctivae normal.  ?   Pupils: Pupils are equal, round, and reactive to light.  ?Neck:  ?   Thyroid: No thyroid mass or thyromegaly.  ?   Vascular: No carotid bruit.  ?   Trachea: Trachea normal.  ?Cardiovascular:  ?   Rate and Rhythm: Normal rate and regular rhythm.  ?   Pulses:     ?     Dorsalis pedis pulses are 1+ on the right side and 1+ on the left side.  ?     Posterior tibial pulses are 1+ on the right side and 1+ on the left side.  ?   Heart sounds: Normal heart sounds, S1 normal and S2 normal. No murmur heard. ?  No friction rub. No gallop.  ?   Comments: Chronic venous stasis changes bilateral legs with hyperpigmentation ?Pulmonary:  ?   Effort: Pulmonary effort is normal. No tachypnea or respiratory distress.  ?   Breath sounds: Normal breath sounds. No decreased breath sounds, wheezing, rhonchi or rales.  ?Abdominal:  ?   General: Bowel sounds are normal.  ?   Palpations: Abdomen is soft.  ?   Tenderness: There is no abdominal tenderness.  ?Musculoskeletal:  ?   Cervical back: Normal range of motion and neck supple.  ?   Right lower leg: No edema.  ?   Left lower leg: No edema.  ?Skin: ?   General: Skin is warm and dry.  ?   Findings: No rash.  ?Neurological:  ?   Mental Status: She is alert.  ?   Sensory: Sensory deficit present.  ?   Motor: Atrophy present.  ?   Comments: Atrophy in bilateral legs, no hair growth,  ?Bilateral hammertoes and resulting scab on second and third DIP joint bilateral feet.  ?Psychiatric:     ?   Mood and Affect: Mood is not anxious or depressed.     ?   Speech: Speech normal.     ?   Behavior: Behavior normal. Behavior is  cooperative.     ?   Thought Content: Thought content normal.     ?   Judgment: Judgment normal.  ? ?   ?Results for orders placed or performed in visit on 01/01/22  ?T3, free  ?Result Value Ref Range  ? T3, Free 3.0 2.3 - 4.2 pg/mL  ?T4, free  ?Result Value Ref Range  ? Free T4 1.19 0.60 - 1.60  ng/dL  ?TSH  ?Result Value Ref Range  ? TSH 1.69 0.35 - 5.50 uIU/mL  ?Comprehensive metabolic panel  ?Result Value Ref Range  ? Sodium 137 135 - 145 mEq/L  ? Potassium 4.0 3.5 - 5.1 mEq/L  ? Chloride 100 96 - 112 mEq/L  ? CO2 30 19 - 32 mEq/L  ? Glucose, Bld 79 70 - 99 mg/dL  ? BUN 14 6 - 23 mg/dL  ? Creatinine, Ser 0.67 0.40 - 1.20 mg/dL  ? Total Bilirubin 0.9 0.2 - 1.2 mg/dL  ? Alkaline Phosphatase 109 39 - 117 U/L  ? AST 29 0 - 37 U/L  ? ALT 24 0 - 35 U/L  ? Total Protein 7.0 6.0 - 8.3 g/dL  ? Albumin 4.3 3.5 - 5.2 g/dL  ? GFR 78.25 >60.00 mL/min  ? Calcium 9.6 8.4 - 10.5 mg/dL  ?Lipid panel  ?Result Value Ref Range  ? Cholesterol 228 (H) 0 - 200 mg/dL  ? Triglycerides 131.0 0.0 - 149.0 mg/dL  ? HDL 75.10 >39.00 mg/dL  ? VLDL 26.2 0.0 - 40.0 mg/dL  ? LDL Cholesterol 127 (H) 0 - 99 mg/dL  ? Total CHOL/HDL Ratio 3   ? NonHDL 153.23   ? ? ? ?COVID 19 screen:  No recent travel or known exposure to [place] ?The patient denies respiratory symptoms of COVID 19 at this time. ?The importance of social distancing was discussed today.  ? ?Assessment and Plan ? ?  ?Problem List Items Addressed This Visit   ? ? Corn or callus  ?  Chronic, stable ? ?This is less likely due to vascular issues and more likely due to her hammertoes and rubbing in shoes. ?There is some concern about possible peripheral artery disease but she denies toe pain, cold feet and she did have a normal ABI in 2018.  I offered to consider repeating this to make sure we are not missing anything but she would like to hold off on it at this time. ? ?  ?  ? Falls frequently  ?  Chronic, likely due to peripheral neuropathy ? ?Referred to physical therapy for balance  training.  She has done this in the past and has had significant benefit. ? ?  ?  ? Hammer toes, bilateral  ?  The scabs on bilateral second and third DIP joints of feet are most likely due to rubbing from hammertoes.  She will

## 2022-01-24 NOTE — Assessment & Plan Note (Signed)
Chronic, likely due to peripheral neuropathy ? ?Referred to physical therapy for balance training.  She has done this in the past and has had significant benefit. ?

## 2022-01-24 NOTE — Assessment & Plan Note (Signed)
Chronic, stable ? ?This is less likely due to vascular issues and more likely due to her hammertoes and rubbing in shoes. ?There is some concern about possible peripheral artery disease but she denies toe pain, cold feet and she did have a normal ABI in 2018.  I offered to consider repeating this to make sure we are not missing anything but she would like to hold off on it at this time. ? ?

## 2022-01-26 ENCOUNTER — Other Ambulatory Visit: Payer: Self-pay | Admitting: Family Medicine

## 2022-01-26 DIAGNOSIS — Z7901 Long term (current) use of anticoagulants: Secondary | ICD-10-CM

## 2022-01-28 ENCOUNTER — Ambulatory Visit (INDEPENDENT_AMBULATORY_CARE_PROVIDER_SITE_OTHER): Payer: PPO

## 2022-01-28 DIAGNOSIS — Z7901 Long term (current) use of anticoagulants: Secondary | ICD-10-CM | POA: Diagnosis not present

## 2022-01-28 NOTE — Patient Instructions (Addendum)
Pre visit review using our clinic review tool, if applicable. No additional management support is needed unless otherwise documented below in the visit note. ? ?Continue 1 tablet daily except take 1/2 tablet on Sundays and Thursdays. Recheck in 4 weeks on 6/8.  ? ?

## 2022-01-28 NOTE — Telephone Encounter (Signed)
Pt is compliant with warfarin management and PCP apts. ?Sent in refill.  ?

## 2022-01-28 NOTE — Progress Notes (Signed)
Received fax with results from [facility] for 5/11 for INR of 2.3 and 22.0 for PTT. Not in office 5/11 or 5/12. Sent email to advise [place], RN, [facility], to let pt know to remain on current dosing and recheck on 6/8. Continue 1 tablet daily except take 1/2 tablet on Sundays and Thursdays. Recheck in 4 weeks.  ?Left detailed msg per pt request during last phone call. Pt does not want AVS mailed.  ?

## 2022-02-05 DIAGNOSIS — G629 Polyneuropathy, unspecified: Secondary | ICD-10-CM | POA: Diagnosis not present

## 2022-02-05 DIAGNOSIS — R296 Repeated falls: Secondary | ICD-10-CM | POA: Diagnosis not present

## 2022-02-05 DIAGNOSIS — R278 Other lack of coordination: Secondary | ICD-10-CM | POA: Diagnosis not present

## 2022-02-05 DIAGNOSIS — R2689 Other abnormalities of gait and mobility: Secondary | ICD-10-CM | POA: Diagnosis not present

## 2022-02-11 ENCOUNTER — Other Ambulatory Visit: Payer: Self-pay | Admitting: Internal Medicine

## 2022-02-14 ENCOUNTER — Ambulatory Visit: Payer: PPO | Admitting: Podiatry

## 2022-02-14 ENCOUNTER — Encounter: Payer: Self-pay | Admitting: Podiatry

## 2022-02-14 DIAGNOSIS — B351 Tinea unguium: Secondary | ICD-10-CM

## 2022-02-14 DIAGNOSIS — G629 Polyneuropathy, unspecified: Secondary | ICD-10-CM

## 2022-02-14 DIAGNOSIS — M79675 Pain in left toe(s): Secondary | ICD-10-CM

## 2022-02-14 DIAGNOSIS — D689 Coagulation defect, unspecified: Secondary | ICD-10-CM | POA: Diagnosis not present

## 2022-02-14 DIAGNOSIS — M79674 Pain in right toe(s): Secondary | ICD-10-CM

## 2022-02-14 NOTE — Progress Notes (Signed)
This patient returns to my office for at risk foot care.  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  Patient is taking coumadin.  This patient is unable to cut nails herself since the patient cannot reach her nails.These nails are painful walking and wearing shoes.  She presents for at risk care today.  General Appearance  Alert, conversant and in no acute stress.  Vascular  Dorsalis pedis and posterior tibial  pulses are not  palpable  bilaterally.  Capillary return is within normal limits  bilaterally. Cold feet.  Absent hair.  Venous stasis dermatitis  Legs  B/L. bilaterally.  Neurologic  Senn-Weinstein monofilament wire test within normal limits  bilaterally. Muscle power within normal limits bilaterally.  Nails Thick disfigured discolored nails with subungual debris  from hallux to fifth toes bilaterally. No evidence of bacterial infection or drainage bilaterally.  Orthopedic  No limitations of motion  feet .  No crepitus or effusions noted.  Hammer toes 2-4  B/L.  Hallux malleus right foot.  Skin  normotropic skin with no porokeratosis noted bilaterally.  No signs of infections or ulcers noted.     Onychomycosis  Pain in right toes  Pain in left toes  Consent was obtained for treatment procedures.    Mechanical debridement of nails 1-5  bilaterally performed with a nail nipper.  Filed with dremel without incident.   Gave her padding for hallux malleus.   Return office visit   4  months                  Told patient to return for periodic foot care and evaluation due to potential at risk complications.   Gardiner Barefoot DPM

## 2022-02-15 DIAGNOSIS — G629 Polyneuropathy, unspecified: Secondary | ICD-10-CM | POA: Diagnosis not present

## 2022-02-15 DIAGNOSIS — R296 Repeated falls: Secondary | ICD-10-CM | POA: Diagnosis not present

## 2022-02-15 DIAGNOSIS — R278 Other lack of coordination: Secondary | ICD-10-CM | POA: Diagnosis not present

## 2022-02-15 DIAGNOSIS — R2689 Other abnormalities of gait and mobility: Secondary | ICD-10-CM | POA: Diagnosis not present

## 2022-02-21 ENCOUNTER — Ambulatory Visit (INDEPENDENT_AMBULATORY_CARE_PROVIDER_SITE_OTHER): Payer: PPO

## 2022-02-21 DIAGNOSIS — I482 Chronic atrial fibrillation, unspecified: Secondary | ICD-10-CM | POA: Diagnosis not present

## 2022-02-21 DIAGNOSIS — Z7901 Long term (current) use of anticoagulants: Secondary | ICD-10-CM | POA: Diagnosis not present

## 2022-02-21 DIAGNOSIS — D802 Selective deficiency of immunoglobulin A [IgA]: Secondary | ICD-10-CM | POA: Diagnosis not present

## 2022-02-21 LAB — POCT INR: INR: 2.4 (ref 2.0–3.0)

## 2022-02-21 NOTE — Patient Instructions (Addendum)
Pre visit review using our clinic review tool, if applicable. No additional management support is needed unless otherwise documented below in the visit note.  Continue 1 tablet daily except take 1/2 tablet on Sundays and Thursdays. Recheck in 4 weeks, on 03/21/22.

## 2022-02-21 NOTE — Progress Notes (Signed)
Received fax with results from [facility] for 6/8 for INR of 2.4 and 22.7 for PTT. Sent email to advise Merlene Morse, RN, and Burnett Harry, CMA, at [facility], to let pt know to remain on current dosing and recheck on 7/6. Continue 1 tablet daily except take 1/2 tablet on Sundays and Thursdays. Recheck in 4 weeks.  Left detailed msg per pt request during last phone call. Pt does not want AVS mailed.

## 2022-03-18 DIAGNOSIS — R2689 Other abnormalities of gait and mobility: Secondary | ICD-10-CM | POA: Diagnosis not present

## 2022-03-18 DIAGNOSIS — R296 Repeated falls: Secondary | ICD-10-CM | POA: Diagnosis not present

## 2022-03-18 DIAGNOSIS — R278 Other lack of coordination: Secondary | ICD-10-CM | POA: Diagnosis not present

## 2022-03-18 DIAGNOSIS — G629 Polyneuropathy, unspecified: Secondary | ICD-10-CM | POA: Diagnosis not present

## 2022-03-21 ENCOUNTER — Ambulatory Visit (INDEPENDENT_AMBULATORY_CARE_PROVIDER_SITE_OTHER): Payer: PPO

## 2022-03-21 ENCOUNTER — Other Ambulatory Visit: Payer: Self-pay | Admitting: Family Medicine

## 2022-03-21 DIAGNOSIS — Z7901 Long term (current) use of anticoagulants: Secondary | ICD-10-CM | POA: Diagnosis not present

## 2022-03-21 DIAGNOSIS — I482 Chronic atrial fibrillation, unspecified: Secondary | ICD-10-CM | POA: Diagnosis not present

## 2022-03-21 DIAGNOSIS — D802 Selective deficiency of immunoglobulin A [IgA]: Secondary | ICD-10-CM | POA: Diagnosis not present

## 2022-03-21 LAB — POCT INR: INR: 2 (ref 2.0–3.0)

## 2022-03-21 NOTE — Progress Notes (Signed)
Received fax with results from [facility] for 7/6 for INR of 2.0 and 19.5 for PT. Sent email to [facility] with request for next testing date. Continue 1 tablet daily except take 1/2 tablet on Sundays and Thursdays. Recheck in 4 weeks, on 04/18/22.  Contacted pt with instructions. Pt verbalized understanding.

## 2022-03-21 NOTE — Patient Instructions (Addendum)
Pre visit review using our clinic review tool, if applicable. No additional management support is needed unless otherwise documented below in the visit note.  Continue 1 tablet daily except take 1/2 tablet on Sundays and Thursdays. Recheck in 4 weeks, on 04/18/22.

## 2022-03-21 NOTE — Telephone Encounter (Signed)
Last office visit 01/24/22 for polyneuropathy, falls, hammer toes and corn or callus.  Last refilled 11/22/2021 for #270 with 1 refill.  CPE 12/26/2022.

## 2022-03-29 ENCOUNTER — Telehealth: Payer: Self-pay

## 2022-03-29 NOTE — Telephone Encounter (Signed)
Patient called in stating that she was prescribed ADVAIR and has been taking it for awhile but insurance will no longer cover it so pharmacy advised her to take [facility]. Since she has been taking it she has noticed hot flashes and an increase in her heart rate (feels like it could pump out of her chest).

## 2022-03-29 NOTE — Telephone Encounter (Signed)
Clarify does she mean she is having side effects since changing to [facility] or while using Advair.

## 2022-03-31 ENCOUNTER — Encounter: Payer: Self-pay | Admitting: Family Medicine

## 2022-03-31 NOTE — Telephone Encounter (Signed)
See MyChart message patient also sent. 

## 2022-03-31 NOTE — Addendum Note (Signed)
Addended by: Carter Kitten on: 03/31/2022 07:21 PM   Modules accepted: Orders

## 2022-04-01 MED ORDER — FLUTICASONE-SALMETEROL 100-50 MCG/ACT IN AEPB: 1.0000 | INHALATION_SPRAY | Freq: Two times a day (BID) | RESPIRATORY_TRACT | 3 refills | Status: DC

## 2022-04-01 NOTE — Telephone Encounter (Signed)
Okay to change back to Advair as SER to [employer]... not when prior authorization comes through.

## 2022-04-01 NOTE — Addendum Note (Signed)
Addended byEliezer Lofts E on: 04/01/2022 10:13 AM   Modules accepted: Orders

## 2022-04-02 ENCOUNTER — Encounter: Payer: Self-pay | Admitting: Family Medicine

## 2022-04-02 ENCOUNTER — Other Ambulatory Visit: Payer: Self-pay | Admitting: Family Medicine

## 2022-04-02 ENCOUNTER — Telehealth: Payer: Self-pay | Admitting: *Deleted

## 2022-04-02 NOTE — Telephone Encounter (Signed)
Received message from pharmacy requesting PA for Advair 100-50 mcg/act.  PA completed on CoverMyMeds and sent the HTA for review.  Can take up to 72 hours for a decision.

## 2022-04-03 ENCOUNTER — Encounter: Payer: Self-pay | Admitting: Family Medicine

## 2022-04-03 NOTE — Telephone Encounter (Signed)
PA approved effective 18-JUL-23:31-DEC-23 Advair Diskus 100-50MCG/ACT IN AEPB Quantity:60.  Patient notified of approval via [place].

## 2022-04-08 NOTE — Telephone Encounter (Signed)
HTA Respiratory Tract/Pulmonary Agents formulary printed and placed in Dr. Rometta Emery in box to review.

## 2022-04-08 NOTE — Telephone Encounter (Signed)
Can you look into her insurance formulary given her age and confusion level and see if anything other than Advair is on formulary.  She did not tolerate the Wixela.

## 2022-04-09 NOTE — Telephone Encounter (Signed)
Contact patient.Marland Kitchen looks like Anoro, Arnutity or Breztri are on formulary tier 3 ( no appropriate teir 2)... has she tried and failed any of these in past?  If not.. I will pick and send in to the pharmacy of her choice.

## 2022-04-11 MED ORDER — WIXELA INHUB 100-50 MCG/ACT IN AEPB: 1.0000 | INHALATION_SPRAY | Freq: Two times a day (BID) | RESPIRATORY_TRACT | 0 refills | Status: DC

## 2022-04-11 NOTE — Telephone Encounter (Signed)
Wixela 100-50 mcg [facility] [address] as [facility].  FYI to Dr. Diona Browner.

## 2022-04-11 NOTE — Addendum Note (Signed)
Addended by: Carter Kitten on: 04/11/2022 08:17 AM   Modules accepted: Orders

## 2022-04-12 ENCOUNTER — Encounter: Payer: Self-pay | Admitting: Family Medicine

## 2022-04-18 ENCOUNTER — Ambulatory Visit (INDEPENDENT_AMBULATORY_CARE_PROVIDER_SITE_OTHER): Payer: PPO

## 2022-04-18 DIAGNOSIS — Z7901 Long term (current) use of anticoagulants: Secondary | ICD-10-CM | POA: Diagnosis not present

## 2022-04-18 DIAGNOSIS — D802 Selective deficiency of immunoglobulin A [IgA]: Secondary | ICD-10-CM | POA: Diagnosis not present

## 2022-04-18 DIAGNOSIS — I482 Chronic atrial fibrillation, unspecified: Secondary | ICD-10-CM | POA: Diagnosis not present

## 2022-04-18 LAB — POCT INR: INR: 1.5 — AB (ref 2.0–3.0)

## 2022-04-18 NOTE — Patient Instructions (Addendum)
Pre visit review using our clinic review tool, if applicable. No additional management support is needed unless otherwise documented below in the visit note.  Increase dose today to take 1 tablet and then change weekly dosing to take 1 tablet daily. Recheck in 2 weeks, on 05/02/22.

## 2022-04-18 NOTE — Progress Notes (Signed)
Received fax with results from [facility] for 8/3 for INR of 1.5 and 14.6 for PT. Sent email to [facility] with request for next testing date. Increase dose today to take 1 tablet and then change weekly dosing to take 1 tablet daily. Recheck in 2 weeks, on 05/02/22.  Contacted pt with instructions. Pt verbalized understanding.

## 2022-04-22 DIAGNOSIS — G629 Polyneuropathy, unspecified: Secondary | ICD-10-CM | POA: Diagnosis not present

## 2022-04-22 DIAGNOSIS — R278 Other lack of coordination: Secondary | ICD-10-CM | POA: Diagnosis not present

## 2022-04-22 DIAGNOSIS — R2689 Other abnormalities of gait and mobility: Secondary | ICD-10-CM | POA: Diagnosis not present

## 2022-04-22 DIAGNOSIS — R296 Repeated falls: Secondary | ICD-10-CM | POA: Diagnosis not present

## 2022-05-02 ENCOUNTER — Ambulatory Visit (INDEPENDENT_AMBULATORY_CARE_PROVIDER_SITE_OTHER): Payer: PPO

## 2022-05-02 DIAGNOSIS — Z7901 Long term (current) use of anticoagulants: Secondary | ICD-10-CM

## 2022-05-02 DIAGNOSIS — I482 Chronic atrial fibrillation, unspecified: Secondary | ICD-10-CM | POA: Diagnosis not present

## 2022-05-02 DIAGNOSIS — D802 Selective deficiency of immunoglobulin A [IgA]: Secondary | ICD-10-CM | POA: Diagnosis not present

## 2022-05-02 LAB — POCT INR: INR: 3.1 — AB (ref 2.0–3.0)

## 2022-05-02 NOTE — Patient Instructions (Addendum)
Pre visit review using our clinic review tool, if applicable. No additional management support is needed unless otherwise documented below in the visit note.  Reduce dose today to take 1/2 tablet and then continue 1 tablet daily. Recheck in 3 weeks, on 04/22/22.

## 2022-05-02 NOTE — Progress Notes (Signed)
Received fax with results from [facility] for 8/17 for INR of 3.1 and 29.1 for PT. Sent email to [facility] with request for next testing date. Reduce dose today to take 1/2 tablet and then continue 1 tablet daily. Recheck in 3 weeks, on 04/22/22.  Contacted pt with instructions. Pt verbalized understanding.

## 2022-05-10 ENCOUNTER — Other Ambulatory Visit: Payer: Self-pay | Admitting: Family Medicine

## 2022-05-15 NOTE — Progress Notes (Signed)
Cardiology Office Note  Date:  05/17/2022   ID:  [name], DOB Jan 31, 1932, MRN [id number]  PCP:  Mackenzie Sanders, MD   Chief Complaint  Patient presents with   12 month follow up     Patient c/o shortness of breath. Medications reviewed by the patient verbally.     HPI:  [name] is a 88 year old woman with  chronic atrial fibrillation, on warfarin COPD,   40 years of smoking, quit 20 years ago hyperlipidemia Chronic leg pain, weakness She lives at [facility] [place]. Chronic leg swelling who presents for routine followup of her atrial fibrillation.   Last seen in clinic August 2022 Lives at [facility] with a cane, gait instability Likes chair volleyball  Last week out watching family play sports in the heat Had episode of near syncope, denies losing consciousness Had to lay down, felt better with cold compress and cold drinks For she is staying hydrated Typically does not have orthostasis symptoms  Chronic shortness of breath Followed by pulmonary/[place] Has COPD, advair changed to [place] presumably secondary to cost She does not feel this inhaler was working as well for her breathing Denies any COPD exacerbations  No significant leg edema Wound right lower extremity with very friable tissue  EKG personally reviewed by myself on todays visit Shows atrial fibrillation rate 83 bpm poor R wave progression through the anterior precordial leads, left axis deviation  Other past medical history reviewed chronic vertigo, balance issues, legs weak Previously seen by [name], [name] for vertigo  Prior cardiac notes from [name] in [place] indicate she has chronic atrial fibrillation,  echocardiogram in 2010 and March 2014 confirming atrial fibrillation. No old EKGs are available.   Echocardiogram from March 2014 shows normal LV systolic function, severely dilated left and right atrium, mildly elevated right ventricular systolic pressure estimated at 35 mm of  mercury  PMH:   has a past medical history of Atrial fibrillation ([place]), COPD (chronic obstructive pulmonary disease) ([place]), Hypothyroidism, Microscopic colitis, Osteoporosis, Squamous cell carcinoma, and Syncope.  PSH:    Past Surgical History:  Procedure Laterality Date   APPENDECTOMY     CATARACT EXTRACTION Bilateral    COLONOSCOPY     LIPOMA EXCISION     MULTIPLE TOOTH EXTRACTIONS     REFRACTIVE SURGERY     SQUAMOUS CELL CARCINOMA EXCISION  07/10/2017   left shin    Current Outpatient Medications  Medication Sig Dispense Refill   gabapentin (NEURONTIN) 100 MG capsule TAKE 3 CAPSULES(300 MG) BY MOUTH AT BEDTIME 270 capsule 1   ipratropium (ATROVENT) 0.03 % nasal spray PLACE 2 SPRAYS IN EACH NOSTRIL THREE TIMES DAILY AS NEEDED PRIOR TO ACTIVITIES THATS CAUSE NOSE TO RUN 30 mL 5   levothyroxine (SYNTHROID) 75 MCG tablet TAKE 1 TABLET BY MOUTH 30 MINS BEFORE EATING WITH A FULL GLASS OF WATER 90 tablet 3   verapamil (CALAN-SR) 120 MG CR tablet TAKE 1 TABLET(120 MG) BY MOUTH DAILY 90 tablet 3   warfarin (COUMADIN) 5 MG tablet TAKE 1 TABLET BY MOUTH DAILY EXCEPT TAKE 1/2 TABLET ON SUNDAYS AND THURSDAYS OR AS DIRECTED BY COAGULATION CLINIC 90 tablet 1   WIXELA INHUB 100-50 MCG/ACT AEPB INHALE 1 PUFF INTO THE LUNGS TWICE A DAY 60 each 5   No current facility-administered medications for this visit.    Allergies:   Sulfa antibiotics   Social History:  The patient  reports that she quit smoking about 14 years ago. Her smoking use included  cigarettes. She has a 40.00 pack-year smoking history. She has never used smokeless tobacco. She reports that she does not currently use alcohol. She reports that she does not use drugs.   Family History:   family history includes Alzheimer's disease in her mother; Bladder Cancer in her brother; Brain cancer in her father; Cancer in her father; Cancer (age of onset: 80) in her brother; Skin cancer in her brother.    Review of Systems: Review of Systems   HENT: Negative.    Respiratory: Negative.    Cardiovascular: Negative.   Gastrointestinal: Negative.   Musculoskeletal: Negative.        Poor balance, pain in her feet  Neurological: Negative.   Psychiatric/Behavioral: Negative.    All other systems reviewed and are negative.   PHYSICAL EXAM: VS:  BP 100/74 (BP Location: Left Arm, Patient Position: Sitting, Cuff Size: Normal)   Pulse 83   Ht '5\' 8"'$  (1.727 m)   Wt 133 lb (60.3 kg)   SpO2 92%   BMI 20.22 kg/m  , BMI Body mass index is 20.22 kg/m. Constitutional:  oriented to person, place, and time. No distress.  HENT:  Head: Grossly normal Eyes:  no discharge. No scleral icterus.  Neck: No JVD, no carotid bruits  Cardiovascular: Irregularly irregular,  no murmurs appreciated Pulmonary/Chest: Clear to auscultation bilaterally, no wheezes or rails Abdominal: Soft.  no distension.  no tenderness.  Musculoskeletal: Normal range of motion Neurological:  normal muscle tone. Coordination normal. No atrophy Skin: Skin warm and dry Psychiatric: normal affect, pleasant   Recent Labs: 01/01/2022: ALT 24; BUN 14; Creatinine, Ser 0.67; Potassium 4.0; Sodium 137; TSH 1.69    Lipid Panel Lab Results  Component Value Date   CHOL 228 (H) 01/01/2022   HDL 75.10 01/01/2022   LDLCALC 127 (H) 01/01/2022   TRIG 131.0 01/01/2022    Wt Readings from Last 3 Encounters:  05/17/22 133 lb (60.3 kg)  01/24/22 137 lb (62.1 kg)  01/08/22 135 lb 2 oz (61.3 kg)     ASSESSMENT AND PLAN:  Atrial fibrillation, unspecified type ([place]) - Plan: EKG 12-Lead Rate controlled, asymptomatic, CBC today Recommend she call for further orthostasis symptoms Suggest she stay hydrated and avoid excessive heat  HLD (hyperlipidemia) No known CAD or PAD Does not want statin, numbers reviewed  Centrilobular emphysema (HCC) Stable, chronic shortness of breath Followed by [name] No recent exacerbation  Leg pain/neuropathy Followed by podiatry and  neurology  Foot drop. Hammertoes  On Neurontin Recommended exercise program for gait stability Has significant gait instability, no recent falls   Total encounter time more than 30 minutes  Greater than 50% was spent in counseling and coordination of care with the patient    Orders Placed This Encounter  Procedures   EKG 12-Lead     Signed, Esmond Plants, M.D., Ph.D. 05/17/2022  [place], [place]

## 2022-05-17 ENCOUNTER — Encounter: Payer: Self-pay | Admitting: Cardiovascular Disease

## 2022-05-17 ENCOUNTER — Other Ambulatory Visit
Admission: RE | Admit: 2022-05-17 | Discharge: 2022-05-17 | Disposition: A | Discharge status: Home / Self Care | Payer: PPO | Attending: Cardiovascular Disease | Admitting: Cardiovascular Disease

## 2022-05-17 ENCOUNTER — Ambulatory Visit: Payer: PPO | Attending: Cardiovascular Disease | Admitting: Cardiovascular Disease

## 2022-05-17 VITALS — BP 100/74 | HR 83 | Ht 68.0 in | Wt 133.0 lb

## 2022-05-17 DIAGNOSIS — E782 Mixed hyperlipidemia: Secondary | ICD-10-CM

## 2022-05-17 DIAGNOSIS — Z79899 Other long term (current) drug therapy: Secondary | ICD-10-CM

## 2022-05-17 DIAGNOSIS — I482 Chronic atrial fibrillation, unspecified: Secondary | ICD-10-CM

## 2022-05-17 DIAGNOSIS — J432 Centrilobular emphysema: Secondary | ICD-10-CM

## 2022-05-17 DIAGNOSIS — I872 Venous insufficiency (chronic) (peripheral): Secondary | ICD-10-CM | POA: Diagnosis not present

## 2022-05-17 LAB — CBC
HCT: 47.5 % — ABNORMAL HIGH (ref 36.0–46.0)
Hemoglobin: 15.4 g/dL — ABNORMAL HIGH (ref 12.0–15.0)
MCH: 30.6 pg (ref 26.0–34.0)
MCHC: 32.4 g/dL (ref 30.0–36.0)
MCV: 94.2 fL (ref 80.0–100.0)
Platelets: 208 10*3/uL (ref 150–400)
RBC: 5.04 MIL/uL (ref 3.87–5.11)
RDW: 13.6 % (ref 11.5–15.5)
WBC: 6.3 10*3/uL (ref 4.0–10.5)
nRBC: 0 % (ref 0.0–0.2)

## 2022-05-17 NOTE — Patient Instructions (Addendum)
Medication Instructions:  No changes  If you need a refill on your cardiac medications before your next appointment, please call your pharmacy.    Lab work: CBC today in [place] Entrance at [facility] 1st desk on the right to check in (REGISTRATION)  Lab hours: Monday- Friday (7:30 am- 5:30 pm)   Testing/Procedures: No new testing needed   Follow-Up: At [facility], you and your health needs are our priority.  As part of our continuing mission to provide you with exceptional heart care, we have created designated Provider Care Teams.  These Care Teams include your primary Cardiologist (physician) and Advanced Practice Providers (APPs -  Physician Assistants and Nurse Practitioners) who all work together to provide you with the care you need, when you need it.  You will need a follow up appointment in 12 months  Providers on your designated Care Team:   Murray Hodgkins, NP Christell Faith, PA-C Cadence Kathlen Mody, [place]  COVID-19 Vaccine Information can be found at: [email or web address] For questions related to vaccine distribution or appointments, please email [email or web address] or call [phone].

## 2022-05-21 ENCOUNTER — Telehealth: Payer: Self-pay | Admitting: Cardiovascular Disease

## 2022-05-21 NOTE — Telephone Encounter (Signed)
Pt calling stating she was informed from her labs 05/17/22 that her hemoglobin is high and she wants to know what to do next.

## 2022-05-21 NOTE — Telephone Encounter (Signed)
Spoke with patient and advised to call her PCP in regards to her questions about the Hgb being elevated. Labs and message forwarded to Dr. Rockey Situ & Dr. Diona Browner. She inquired if her PCP knew about this and reassured that I would forward those to her and Dr. Rockey Situ now for review and any further recommendations. She verbalized understanding with no further questions at this time.

## 2022-05-22 ENCOUNTER — Telehealth: Payer: Self-pay | Admitting: *Deleted

## 2022-05-22 MED ORDER — FLUTICASONE-SALMETEROL 250-50 MCG/ACT IN AEPB: 1.0000 | INHALATION_SPRAY | Freq: Two times a day (BID) | RESPIRATORY_TRACT | 2 refills | Status: DC

## 2022-05-22 NOTE — Telephone Encounter (Signed)
Ms. Driver notified as instructed by telephone.  Patient states understanding and doesn't wish to do anything further at this time.   While on the phone she states that the [place] Ruts is not working well for her and the Advair cost $100 co-pay which she can't afford.   She is asking if there is any other inhaler she might could try.  Please advise.

## 2022-05-22 NOTE — Telephone Encounter (Signed)
I would first suggest increasing the dose of Wixela as this is probably the only type of medication in this family covered by her insurance.  Let me know if she is agreeable and I will and send in a 250/50 dose of Wixela to take 1 puff twice daily.

## 2022-05-22 NOTE — Telephone Encounter (Signed)
Patient returned phone call,I read her the message from Dr. Diona Browner.She is in agreement with the dosage change,and stated that its okay to go [facility]

## 2022-05-22 NOTE — Telephone Encounter (Signed)
Left message for [name] to return my call.

## 2022-05-22 NOTE — Telephone Encounter (Signed)
-----   Message from Jinny Sanders, MD sent at 05/21/2022  5:26 PM EDT -----  Call patient... Slightly elevated hemoglobin has been present for > 7 years. This is likely due to her smoking history and COPD/emphysema causing decreased oxygen carrying ability of red blood cells.. causing the body to increase the amount of hemoglobin available to carry the oxygen.. I do not think she needs further evaluation  of this issue. If she still feels uncomfortable, we can always refer her to a hematologist for a second opinion.  ----- Message ----- From: Valora Corporal, RN Sent: 05/21/2022   3:10 PM EDT To: Jinny Sanders, MD; Minna Merritts, MD  Patient called with questions about her elevated Hemoglobin and any next steps.

## 2022-05-22 NOTE — Telephone Encounter (Signed)
New Rx for Wixela 250-50 mcg[facility] [address]  FYI to Dr. Diona Browner.

## 2022-05-23 ENCOUNTER — Encounter: Payer: Self-pay | Admitting: Podiatry

## 2022-05-23 ENCOUNTER — Ambulatory Visit: Payer: PPO | Admitting: Podiatry

## 2022-05-23 DIAGNOSIS — B351 Tinea unguium: Secondary | ICD-10-CM

## 2022-05-23 DIAGNOSIS — D689 Coagulation defect, unspecified: Secondary | ICD-10-CM | POA: Diagnosis not present

## 2022-05-23 DIAGNOSIS — D802 Selective deficiency of immunoglobulin A [IgA]: Secondary | ICD-10-CM | POA: Diagnosis not present

## 2022-05-23 DIAGNOSIS — M2010 Hallux valgus (acquired), unspecified foot: Secondary | ICD-10-CM

## 2022-05-23 DIAGNOSIS — L989 Disorder of the skin and subcutaneous tissue, unspecified: Secondary | ICD-10-CM | POA: Insufficient documentation

## 2022-05-23 DIAGNOSIS — Z7901 Long term (current) use of anticoagulants: Secondary | ICD-10-CM | POA: Diagnosis not present

## 2022-05-23 DIAGNOSIS — M2042 Other hammer toe(s) (acquired), left foot: Secondary | ICD-10-CM

## 2022-05-23 DIAGNOSIS — M79674 Pain in right toe(s): Secondary | ICD-10-CM

## 2022-05-23 DIAGNOSIS — M79675 Pain in left toe(s): Secondary | ICD-10-CM | POA: Diagnosis not present

## 2022-05-23 DIAGNOSIS — I482 Chronic atrial fibrillation, unspecified: Secondary | ICD-10-CM | POA: Diagnosis not present

## 2022-05-23 DIAGNOSIS — G629 Polyneuropathy, unspecified: Secondary | ICD-10-CM

## 2022-05-23 DIAGNOSIS — M2041 Other hammer toe(s) (acquired), right foot: Secondary | ICD-10-CM

## 2022-05-23 LAB — POCT INR: INR: 2.2 (ref 2.0–3.0)

## 2022-05-23 NOTE — Progress Notes (Signed)
This patient returns to my office for at risk foot care.  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  Patient is taking coumadin.  This patient is unable to cut nails herself since the patient cannot reach her nails.These nails are painful walking and wearing shoes.  She presents for at risk care today.  General Appearance  Alert, conversant and in no acute stress.  Vascular  Dorsalis pedis and posterior tibial  pulses are not  palpable  bilaterally.  Capillary return is within normal limits  bilaterally. Cold feet.  Absent hair.  Venous stasis dermatitis  Legs  B/L. bilaterally.  Neurologic  Senn-Weinstein monofilament wire test within normal limits  bilaterally. Muscle power within normal limits bilaterally.  Nails Thick disfigured discolored nails with subungual debris  from hallux to fifth toes bilaterally. No evidence of bacterial infection or drainage bilaterally.  Orthopedic  No limitations of motion  feet .  No crepitus or effusions noted.  Hammer toes 2-4  B/L.  Hallux malleus right foot. Skin lesions/ulcer minimal 1,2 right foot.  Skin  normotropic skin with no porokeratosis noted bilaterally.  No signs of infections or ulcers noted.     Onychomycosis  Pain in right toes  Pain in left toes  Consent was obtained for treatment procedures.    Mechanical debridement of nails 1-5  bilaterally performed with a nail nipper.  Filed with dremel without incident.   Gave her padding for hallux malleus. Gave her padding for painful second toe due to skin lesion.   Return office visit   3 months                  Told patient to return for periodic foot care and evaluation due to potential at risk complications.   Gardiner Barefoot DPM

## 2022-05-24 ENCOUNTER — Ambulatory Visit (INDEPENDENT_AMBULATORY_CARE_PROVIDER_SITE_OTHER): Payer: PPO

## 2022-05-24 DIAGNOSIS — Z7901 Long term (current) use of anticoagulants: Secondary | ICD-10-CM

## 2022-05-24 NOTE — Progress Notes (Signed)
Received fax with results from [facility] for 05/24/22 for INR of 2.2 and 21.2 for PT. Sent email to [facility] with request for next testing date. Pt has not been taking as prescribed and reports she has been taking 1 tablet daily except take 1/2 tablet on Sundays and Thursdays. Pt is in range today with this dosing so calendar has been updated to reflect what pt was  Continue 1 tablet daily except take 1/2 tablet on Sundays and Thursdays. Recheck in 4 weeks, on 06/20/22.  Contacted pt with instructions. Pt verbalized understanding.

## 2022-05-24 NOTE — Patient Instructions (Addendum)
Pre visit review using our clinic review tool, if applicable. No additional management support is needed unless otherwise documented below in the visit note.  Continue 1 tablet daily except take 1/2 tablet on Sundays and Thursdays. Recheck in 4 weeks, on 06/20/22.

## 2022-06-20 DIAGNOSIS — I482 Chronic atrial fibrillation, unspecified: Secondary | ICD-10-CM | POA: Diagnosis not present

## 2022-06-20 DIAGNOSIS — Z7901 Long term (current) use of anticoagulants: Secondary | ICD-10-CM | POA: Diagnosis not present

## 2022-06-20 DIAGNOSIS — D802 Selective deficiency of immunoglobulin A [IgA]: Secondary | ICD-10-CM | POA: Diagnosis not present

## 2022-06-20 LAB — POCT INR: INR: 2.3 (ref 2.0–3.0)

## 2022-06-21 ENCOUNTER — Ambulatory Visit (INDEPENDENT_AMBULATORY_CARE_PROVIDER_SITE_OTHER): Payer: PPO

## 2022-06-21 DIAGNOSIS — Z7901 Long term (current) use of anticoagulants: Secondary | ICD-10-CM

## 2022-06-21 NOTE — Patient Instructions (Signed)
Continue 1 tablet daily except take 1/2 tablet on Sundays and Thursdays. Recheck in 4 weeks, on 07/18/22.

## 2022-06-21 NOTE — Progress Notes (Signed)
Reached out to [facility] and received INR result of 2.3 from 10/5.  Continue 1 tablet daily except take 1/2 tablet on Sundays and Thursdays. Recheck in 4 weeks, on 07/18/22.   Contacted pt with instructions. Pt verbalized understanding. Instructions also emailed to [place] at [facility].

## 2022-06-26 ENCOUNTER — Other Ambulatory Visit: Payer: Self-pay | Admitting: Family Medicine

## 2022-06-26 ENCOUNTER — Other Ambulatory Visit: Payer: Self-pay | Admitting: Cardiovascular Disease

## 2022-06-26 ENCOUNTER — Other Ambulatory Visit: Payer: Self-pay

## 2022-06-26 DIAGNOSIS — Z7901 Long term (current) use of anticoagulants: Secondary | ICD-10-CM

## 2022-06-26 MED ORDER — WARFARIN SODIUM 5 MG PO TABS: ORAL_TABLET | ORAL | 1 refills | Status: DC

## 2022-07-01 ENCOUNTER — Encounter: Payer: Self-pay | Admitting: Family Medicine

## 2022-07-18 ENCOUNTER — Ambulatory Visit (INDEPENDENT_AMBULATORY_CARE_PROVIDER_SITE_OTHER): Payer: PPO

## 2022-07-18 DIAGNOSIS — Z7901 Long term (current) use of anticoagulants: Secondary | ICD-10-CM

## 2022-07-18 DIAGNOSIS — D802 Selective deficiency of immunoglobulin A [IgA]: Secondary | ICD-10-CM | POA: Diagnosis not present

## 2022-07-18 DIAGNOSIS — I482 Chronic atrial fibrillation, unspecified: Secondary | ICD-10-CM | POA: Diagnosis not present

## 2022-07-18 LAB — POCT INR: INR: 2.5 (ref 2.0–3.0)

## 2022-07-18 NOTE — Progress Notes (Signed)
Received INR result of 2.5 from [facility].  Continue 1 tablet daily except take 1/2 tablet on Sundays and Thursdays. Recheck in 4 weeks, on 08/15/22.   Contacted pt with instructions. Pt verbalized understanding. Instructions also emailed to [place] and Verda Cumins at [facility].

## 2022-07-18 NOTE — Patient Instructions (Signed)
Continue 1 tablet daily except take 1/2 tablet on Sundays and Thursdays. Recheck in 4 weeks, on 08/15/22.

## 2022-07-23 DIAGNOSIS — Z961 Presence of intraocular lens: Secondary | ICD-10-CM | POA: Diagnosis not present

## 2022-08-15 DIAGNOSIS — D802 Selective deficiency of immunoglobulin A [IgA]: Secondary | ICD-10-CM | POA: Diagnosis not present

## 2022-08-15 DIAGNOSIS — I480 Paroxysmal atrial fibrillation: Secondary | ICD-10-CM | POA: Diagnosis not present

## 2022-08-15 DIAGNOSIS — Z7901 Long term (current) use of anticoagulants: Secondary | ICD-10-CM | POA: Diagnosis not present

## 2022-08-15 LAB — POCT INR: INR: 2.6 (ref 2.0–3.0)

## 2022-08-16 ENCOUNTER — Ambulatory Visit (INDEPENDENT_AMBULATORY_CARE_PROVIDER_SITE_OTHER): Payer: PPO

## 2022-08-16 DIAGNOSIS — Z7901 Long term (current) use of anticoagulants: Secondary | ICD-10-CM | POA: Diagnosis not present

## 2022-08-16 NOTE — Progress Notes (Signed)
Received INR result of 2.6 from [facility].  Continue 1 tablet daily except take 1/2 tablet on Sundays and Thursdays. Recheck in 4 weeks, on 09/12/22.   Contacted pt with instructions. Pt verbalized understanding. Instructions also emailed to [place] and Verda Cumins at [facility].

## 2022-08-16 NOTE — Patient Instructions (Signed)
Continue 1 tablet daily except take 1/2 tablet on Sundays and Thursdays. Recheck in 4 weeks, on 09/12/22.

## 2022-08-22 ENCOUNTER — Ambulatory Visit: Payer: PPO | Admitting: Podiatry

## 2022-08-30 ENCOUNTER — Other Ambulatory Visit: Payer: Self-pay | Admitting: Family Medicine

## 2022-09-02 ENCOUNTER — Ambulatory Visit: Payer: PPO | Admitting: Student

## 2022-09-02 ENCOUNTER — Encounter: Payer: Self-pay | Admitting: Student

## 2022-09-02 VITALS — BP 120/64 | HR 69 | Temp 97.7°F | Ht 68.0 in | Wt 136.0 lb

## 2022-09-02 DIAGNOSIS — S81811A Laceration without foreign body, right lower leg, initial encounter: Secondary | ICD-10-CM | POA: Diagnosis not present

## 2022-09-02 DIAGNOSIS — R600 Localized edema: Secondary | ICD-10-CM | POA: Diagnosis not present

## 2022-09-02 NOTE — Patient Instructions (Addendum)
Nice to meet you!  I would like [name] to change your wound in your home on Tuesday and Thursday. I will email her and she will call to schedule a time.   I would like to see you back on Friday for a wound check to make sure things are stable.   For your wound, apply the Xeroform, then cover with gauze.   I'll see you Friday.

## 2022-09-02 NOTE — Progress Notes (Unsigned)
Location:  [facility]   Place of Service:   [facility]  Provider: Unk Lightning  Code Status: Full Code Goals of Care:     09/02/2022    1:57 PM  Advanced Directives  Does Patient Have a Medical Advance Directive? Yes  Type of Advance Directive Living will;Out of facility DNR (pink MOST or yellow form)  Does patient want to make changes to medical advance directive? No - Patient declined     Chief Complaint  Patient presents with   Acute Visit    Complains of Swelling in Legs and Toes. [place] a week ago Tuesday and started swelling the Friday afterwards.     HPI: Patient is a 88 y.o. female seen today for an acute visit for  She fell in her kitchen and over the course of the week developed a large bruise and increase swelling of both legs. She also has a chronic callus on the right second toe. She has worn the same shoes every day for the last year.  She didn't have any openings in the leg at the time. She has a history of wounds on the legs.   Past Medical History:  Diagnosis Date   Atrial fibrillation ([place])    COPD (chronic obstructive pulmonary disease) (HCC)    Hypothyroidism    Microscopic colitis    Osteoporosis    Squamous cell carcinoma    Syncope     Past Surgical History:  Procedure Laterality Date   APPENDECTOMY     CATARACT EXTRACTION Bilateral    COLONOSCOPY     LIPOMA EXCISION     MULTIPLE TOOTH EXTRACTIONS     REFRACTIVE SURGERY     SQUAMOUS CELL CARCINOMA EXCISION  07/10/2017   left shin    Allergies  Allergen Reactions   Sulfa Antibiotics Nausea And Vomiting    Outpatient Encounter Medications as of 09/02/2022  Medication Sig   fluticasone-salmeterol (WIXELA INHUB) 250-50 MCG/ACT AEPB Inhale 1 puff into the lungs in the morning and at bedtime.   gabapentin (NEURONTIN) 100 MG capsule TAKE 3 CAPSULES(300 MG) BY MOUTH AT BEDTIME   ipratropium (ATROVENT) 0.03 % nasal spray PLACE 2 SPRAYS IN EACH NOSTRIL THREE TIMES DAILY AS NEEDED PRIOR TO  ACTIVITIES THATS CAUSE NOSE TO RUN   levothyroxine (SYNTHROID) 75 MCG tablet TAKE 1 TABLET BY MOUTH 30 MINS BEFORE EATING WITH A FULL GLASS OF WATER   verapamil (CALAN-SR) 120 MG CR tablet TAKE 1 TABLET BY MOUTH EVERY DAY   warfarin (COUMADIN) 5 MG tablet TAKE 1 TABLET BY MOUTH DAILY EXCEPT TAKE 1/2 TABLET ON SUNDAYS AND THURSDAYS OR AS DIRECTED BY INR   warfarin (COUMADIN) 5 MG tablet TAKE 1 TABLET BY MOUTH DAILY EXCEPT TAKE 1/2 TABLET ON SUNDAYS AND THURSDAYS OR AS DIRECTED BY COAGULATION CLINIC   [DISCONTINUED] fluticasone-salmeterol (ADVAIR) 250-50 MCG/ACT AEPB Inhale 1 puff into the lungs in the morning and at bedtime.   No facility-administered encounter medications on file as of 09/02/2022.    Review of Systems:  Review of Systems  All other systems reviewed and are negative.   Health Maintenance  Topic Date Due   DTaP/Tdap/Td (2 - Tdap) 04/30/2014   DEXA SCAN  06/02/2017   COVID-19 Vaccine (6 - 2023-24 season) 05/17/2022   Medicare Annual Wellness (AWV)  11/16/2022   Pneumonia Vaccine 5+ Years old  Completed   INFLUENZA VACCINE  Completed   Zoster Vaccines- Shingrix  Completed   HPV VACCINES  Aged Out  Physical Exam: Vitals:   09/02/22 1352  BP: 120/64  Pulse: 69  Temp: 97.7 F (36.5 C)  SpO2: 96%  Weight: 136 lb (61.7 kg)  Height: '5\' 8"'$  (1.727 m)   Body mass index is 20.68 kg/m. Physical Exam  Labs reviewed: Basic Metabolic Panel: Recent Labs    01/01/22 0745  NA 137  K 4.0  CL 100  CO2 30  GLUCOSE 79  BUN 14  CREATININE 0.67  CALCIUM 9.6  TSH 1.69   Liver Function Tests: Recent Labs    01/01/22 0745  AST 29  ALT 24  ALKPHOS 109  BILITOT 0.9  PROT 7.0  ALBUMIN 4.3   No results for input(s): "LIPASE", "AMYLASE" in the last 8760 hours. No results for input(s): "AMMONIA" in the last 8760 hours. CBC: Recent Labs    05/17/22 0902  WBC 6.3  HGB 15.4*  HCT 47.5*  MCV 94.2  PLT 208   Lipid Panel: Recent Labs    01/01/22 0745   CHOL 228*  HDL 75.10  LDLCALC 127*  TRIG 131.0  CHOLHDL 3   Lab Results  Component Value Date   HGBA1C 6.0 09/19/2014    Procedures since last visit: No results found.  Assessment/Plan 1. Noninfected skin tear of right lower extremity, initial encounter No signs of infection at this time. Microtears present. Recommend wound changes every other day - on days when patient doesn't shower. Plan for her to come to clinic for RN to change. Cleaned with NS. Applied Xeroform, non-stick gauze, kerlix, and coban. F/u Friday to assess for signs of infection.   2. Pedal Edema Worsened edema in the setting of new leg injury. Longstanding issue with venous insufficiency. Encourage compression and elevation as tolerated. CTM. If swelling progresses will consider additional work up and labs.    Labs/tests ordered:  * No order type specified * Next appt:  Visit date not found

## 2022-09-04 ENCOUNTER — Ambulatory Visit: Payer: PPO | Admitting: Family Medicine

## 2022-09-06 ENCOUNTER — Ambulatory Visit: Payer: PPO | Admitting: Student

## 2022-09-06 ENCOUNTER — Encounter: Payer: Self-pay | Admitting: Student

## 2022-09-06 VITALS — BP 118/72 | HR 72 | Temp 97.9°F | Ht 68.0 in | Wt 137.0 lb

## 2022-09-06 DIAGNOSIS — L97911 Non-pressure chronic ulcer of unspecified part of right lower leg limited to breakdown of skin: Secondary | ICD-10-CM | POA: Diagnosis not present

## 2022-09-06 DIAGNOSIS — R600 Localized edema: Secondary | ICD-10-CM | POA: Diagnosis not present

## 2022-09-06 NOTE — Progress Notes (Signed)
[facility] clinic  Provider: Unk Lightning  Code Status: Full Code Goals of Care:     09/02/2022    1:57 PM  Advanced Directives  Does Patient Have a Medical Advance Directive? Yes  Type of Advance Directive Living will;Out of facility DNR (pink MOST or yellow form)  Does patient want to make changes to medical advance directive? No - Patient declined     Chief Complaint  Patient presents with   Acute Visit    Right Leg Wound Check.     HPI: Patient is a 88 y.o. female seen today for an acute visit for follow up of wound. Patient had a fall on 12/9 which led to a bruise. Now has separation of dermis and epidermis. No pain. No bleeding. Denies fevers, chills tenderness. The ace wrap has been tight and prevented adequate sleep last night.   Past Medical History:  Diagnosis Date   Atrial fibrillation ([place])    COPD (chronic obstructive pulmonary disease) (HCC)    Hypothyroidism    Microscopic colitis    Osteoporosis    Squamous cell carcinoma    Syncope     Past Surgical History:  Procedure Laterality Date   APPENDECTOMY     CATARACT EXTRACTION Bilateral    COLONOSCOPY     LIPOMA EXCISION     MULTIPLE TOOTH EXTRACTIONS     REFRACTIVE SURGERY     SQUAMOUS CELL CARCINOMA EXCISION  07/10/2017   left shin    Allergies  Allergen Reactions   Sulfa Antibiotics Nausea And Vomiting    Outpatient Encounter Medications as of 09/06/2022  Medication Sig   fluticasone-salmeterol (WIXELA INHUB) 250-50 MCG/ACT AEPB Inhale 1 puff into the lungs in the morning and at bedtime.   gabapentin (NEURONTIN) 100 MG capsule TAKE 3 CAPSULES(300 MG) BY MOUTH AT BEDTIME   ipratropium (ATROVENT) 0.03 % nasal spray PLACE 2 SPRAYS IN EACH NOSTRIL THREE TIMES DAILY AS NEEDED PRIOR TO ACTIVITIES THATS CAUSE NOSE TO RUN   levothyroxine (SYNTHROID) 75 MCG tablet TAKE 1 TABLET BY MOUTH 30 MINS BEFORE EATING WITH A FULL GLASS OF WATER   verapamil (CALAN-SR) 120 MG CR tablet TAKE 1 TABLET BY MOUTH EVERY DAY    warfarin (COUMADIN) 5 MG tablet TAKE 1 TABLET BY MOUTH DAILY EXCEPT TAKE 1/2 TABLET ON SUNDAYS AND THURSDAYS OR AS DIRECTED BY INR   warfarin (COUMADIN) 5 MG tablet TAKE 1 TABLET BY MOUTH DAILY EXCEPT TAKE 1/2 TABLET ON SUNDAYS AND THURSDAYS OR AS DIRECTED BY COAGULATION CLINIC   No facility-administered encounter medications on file as of 09/06/2022.    Review of Systems:  Review of Systems  All other systems reviewed and are negative.   Health Maintenance  Topic Date Due   DTaP/Tdap/Td (2 - Tdap) 04/30/2014   DEXA SCAN  06/02/2017   COVID-19 Vaccine (6 - 2023-24 season) 05/17/2022   Medicare Annual Wellness (AWV)  11/16/2022   Pneumonia Vaccine 77+ Years old  Completed   INFLUENZA VACCINE  Completed   Zoster Vaccines- Shingrix  Completed   HPV VACCINES  Aged Out    Physical Exam: Vitals:   09/06/22 1300  BP: 118/72  Pulse: 72  Temp: 97.9 F (36.6 C)  SpO2: 93%  Weight: 137 lb (62.1 kg)  Height: '5\' 8"'$  (1.727 m)   Body mass index is 20.83 kg/m. Physical Exam Constitutional:      Appearance: Normal appearance.  Skin:    Comments: Right lower extemity with 4x2.5cm ulceration with granulation tisuse present. No surrounding erythema  and rolled edges.   Neurological:     Mental Status: She is alert.     Labs reviewed: Basic Metabolic Panel: Recent Labs    01/01/22 0745  NA 137  K 4.0  CL 100  CO2 30  GLUCOSE 79  BUN 14  CREATININE 0.67  CALCIUM 9.6  TSH 1.69   Liver Function Tests: Recent Labs    01/01/22 0745  AST 29  ALT 24  ALKPHOS 109  BILITOT 0.9  PROT 7.0  ALBUMIN 4.3   No results for input(s): "LIPASE", "AMYLASE" in the last 8760 hours. No results for input(s): "AMMONIA" in the last 8760 hours. CBC: Recent Labs    05/17/22 0902  WBC 6.3  HGB 15.4*  HCT 47.5*  MCV 94.2  PLT 208   Lipid Panel: Recent Labs    01/01/22 0745  CHOL 228*  HDL 75.10  LDLCALC 127*  TRIG 131.0  CHOLHDL 3   Lab Results  Component Value Date    HGBA1C 6.0 09/19/2014    Procedures since last visit: No results found.  Assessment/Plan 1. Pedal edema Patient evaluated in clinic second time. Continued concern for swelling in lower extremities which has worsened since her fall on 12/9. No clear external deformities present. Plan to evaluate with bilateral venous ultrasound due to concern for progression of venous stasis vs DVT.   2. Ulcer of right lower extremity, limited to breakdown of skin ([place]) Patient's wound is improving with qod wound changes. Cleaned today with saline and iodine, debrided epidermis layer of skin to reveal healthy granulation tissue. Patient tolerated well. Applied vaseline infused gauze, ABD pad, kerlix to secure, an ace bandage for additional compression. Recommend patient return to PCP for further follow up regarding wound. Will continue with Tuesday and Thursday wound changes with nurse on campus.    Labs/tests ordered:  * No order type specified * Next appt:  Visit date not found

## 2022-09-06 NOTE — Patient Instructions (Addendum)
Nice to see you!  I hope you have a wonderful holiday.  Please have your wound changed once this weekend by EMS. Once Merlene Morse returns, she will resume wound care.   I have written a referral for imaging of your leg. Please call to schedule an appointment with Dr. Diona Browner in the upcoming weeks.

## 2022-09-11 ENCOUNTER — Telehealth: Payer: Self-pay

## 2022-09-11 NOTE — Telephone Encounter (Signed)
Patient called stating that there was an order placed for imaging. She stated that she was told to see her PCP, so when the imaging people called she canceled the appointment. She wants to know should she have went ahead and made the appointment with them. I asked her if she called imaging back to reschedule and she said no. She wants to get an answer from you before she reschedules appointment.  Message routed to Dr. Dewayne Shorter

## 2022-09-12 ENCOUNTER — Ambulatory Visit (INDEPENDENT_AMBULATORY_CARE_PROVIDER_SITE_OTHER): Payer: PPO

## 2022-09-12 DIAGNOSIS — I482 Chronic atrial fibrillation, unspecified: Secondary | ICD-10-CM | POA: Diagnosis not present

## 2022-09-12 DIAGNOSIS — Z7901 Long term (current) use of anticoagulants: Secondary | ICD-10-CM

## 2022-09-12 DIAGNOSIS — D802 Selective deficiency of immunoglobulin A [IgA]: Secondary | ICD-10-CM | POA: Diagnosis not present

## 2022-09-12 LAB — POCT INR: INR: 2.8 (ref 2.0–3.0)

## 2022-09-12 NOTE — Telephone Encounter (Signed)
Patient called and left message on Clinical intake regarding a status on her message that she had left previously.   Tried calling patient back to tell her that she needed the imaging to rule out Blood Clot per Dr. Emeline Gins OV note 09/06/2022: Assessment/Plan 1. Pedal edema Patient evaluated in clinic second time. Continued concern for swelling in lower extremities which has worsened since her fall on 12/9. No clear external deformities present. Plan to evaluate with bilateral venous ultrasound due to concern for progression of venous stasis vs DVT.     LMOM for patient to return call.

## 2022-09-12 NOTE — Patient Instructions (Signed)
Continue 1 tablet daily except take 1/2 tablet on Sundays and Thursdays. Recheck in 4 weeks, on 10/10/22.

## 2022-09-12 NOTE — Progress Notes (Signed)
Received INR result of 2.8 from [facility].  Continue 1 tablet daily except take 1/2 tablet on Sundays and Thursdays. Recheck in 4 weeks, on 10/10/22.   LVM for patient with the above instructions. Instructions also emailed to [place] and Verda Cumins at [facility].

## 2022-09-12 NOTE — Telephone Encounter (Signed)
Patient notified and agreed. Stated that she will call the imaging place back to reschedule her appointment.   Patient stated that she also has an appointment scheduled with her PCP 09/18/21

## 2022-09-13 ENCOUNTER — Ambulatory Visit
Admission: RE | Admit: 2022-09-13 | Discharge: 2022-09-13 | Disposition: A | Discharge status: Home / Self Care | Payer: PPO | Source: Ambulatory Visit | Attending: Student | Admitting: Student

## 2022-09-13 DIAGNOSIS — R600 Localized edema: Secondary | ICD-10-CM | POA: Diagnosis not present

## 2022-09-18 ENCOUNTER — Ambulatory Visit (INDEPENDENT_AMBULATORY_CARE_PROVIDER_SITE_OTHER): Payer: PPO | Admitting: Family Medicine

## 2022-09-18 VITALS — BP 120/78 | HR 82 | Temp 97.7°F | Ht 68.0 in | Wt 137.2 lb

## 2022-09-18 DIAGNOSIS — L97919 Non-pressure chronic ulcer of unspecified part of right lower leg with unspecified severity: Secondary | ICD-10-CM

## 2022-09-18 DIAGNOSIS — G629 Polyneuropathy, unspecified: Secondary | ICD-10-CM | POA: Diagnosis not present

## 2022-09-18 DIAGNOSIS — I83019 Varicose veins of right lower extremity with ulcer of unspecified site: Secondary | ICD-10-CM | POA: Diagnosis not present

## 2022-09-18 NOTE — Progress Notes (Signed)
Patient ID: Uniqua [name], female    DOB: 1931/11/02, 89 y.o.   MRN: [id number]  This visit was conducted in person.  BP 120/78   Pulse 82   Temp 97.7 F (36.5 C) (Oral)   Ht '5\' 8"'$  (1.727 m)   Wt 137 lb 3.2 oz (62.2 kg)   SpO2 98%   BMI 20.86 kg/m    CC:  Chief Complaint  Patient presents with   Leg Swelling    Right leg/calf   Leg wound    Subjective:   HPI: [name] is a 89 y.o. female with history of atrial fibrillation, chronic venous insufficiency, polyneuropathy  presenting on 09/18/2022 for Leg Swelling (Right leg/calf) and Leg wound     She reports  she  injured her right leg on 08/20/22. She fell accidentally in kitchen...  had skin tear.  At [facility], she saw nurse... debrided tissue, treated with Xeroform gauze and pressure dressing.  Saw Dr. Unk Lightning, MD at [facility].Marland Kitchen given right leg swelling.. had [place].. no DVT.  Pain in leg from swelling, and the right leg continues to be swollen.  No spreading redness, no odor or discharge.  No fever, no SOB, feels well otherwise.      Relevant past medical, surgical, family and social history reviewed and updated as indicated. Interim medical history since our last visit reviewed. Allergies and medications reviewed and updated. Outpatient Medications Prior to Visit  Medication Sig Dispense Refill   fluticasone-salmeterol (WIXELA INHUB) 250-50 MCG/ACT AEPB Inhale 1 puff into the lungs in the morning and at bedtime.     gabapentin (NEURONTIN) 100 MG capsule TAKE 3 CAPSULES(300 MG) BY MOUTH AT BEDTIME 270 capsule 1   ipratropium (ATROVENT) 0.03 % nasal spray Place 2 sprays into both nostrils 3 (three) times daily.     levothyroxine (SYNTHROID) 75 MCG tablet TAKE 1 TABLET BY MOUTH 30 MINS BEFORE EATING WITH A FULL GLASS OF WATER 90 tablet 3   verapamil (CALAN-SR) 120 MG CR tablet TAKE 1 TABLET BY MOUTH EVERY DAY 90 tablet 3   warfarin (COUMADIN) 5 MG tablet TAKE 1 TABLET BY MOUTH DAILY EXCEPT TAKE 1/2 TABLET ON  SUNDAYS AND THURSDAYS OR AS DIRECTED BY INR 90 tablet 1   ipratropium (ATROVENT) 0.03 % nasal spray PLACE 2 SPRAYS IN EACH NOSTRIL THREE TIMES DAILY AS NEEDED PRIOR TO ACTIVITIES THATS CAUSE NOSE TO RUN (Patient not taking: Reported on 09/18/2022) 30 mL 5   warfarin (COUMADIN) 5 MG tablet TAKE 1 TABLET BY MOUTH DAILY EXCEPT TAKE 1/2 TABLET ON SUNDAYS AND THURSDAYS OR AS DIRECTED BY COAGULATION CLINIC (Patient not taking: Reported on 09/18/2022) 90 tablet 1   No facility-administered medications prior to visit.     Per HPI unless specifically indicated in ROS section below Review of Systems  Constitutional:  Negative for fatigue and fever.  HENT:  Negative for congestion.   Eyes:  Negative for pain.  Respiratory:  Negative for cough and shortness of breath.   Cardiovascular:  Negative for chest pain, palpitations and leg swelling.  Gastrointestinal:  Negative for abdominal pain.  Genitourinary:  Negative for dysuria and vaginal bleeding.  Musculoskeletal:  Negative for back pain.  Neurological:  Negative for syncope, light-headedness and headaches.  Psychiatric/Behavioral:  Negative for dysphoric mood.    Objective:  BP 120/78   Pulse 82   Temp 97.7 F (36.5 C) (Oral)   Ht '5\' 8"'$  (1.727 m)   Wt 137 lb 3.2 oz (62.2 kg)  SpO2 98%   BMI 20.86 kg/m   Wt Readings from Last 3 Encounters:  09/18/22 137 lb 3.2 oz (62.2 kg)  09/06/22 137 lb (62.1 kg)  09/02/22 136 lb (61.7 kg)      Physical Exam Constitutional:      General: She is not in acute distress.    Appearance: Normal appearance. She is well-developed. She is not ill-appearing or toxic-appearing.  HENT:     Head: Normocephalic.     Right Ear: Hearing, tympanic membrane, ear canal and external ear normal. Tympanic membrane is not erythematous, retracted or bulging.     Left Ear: Hearing, tympanic membrane, ear canal and external ear normal. Tympanic membrane is not erythematous, retracted or bulging.     Nose: No mucosal edema or  rhinorrhea.     Right Sinus: No maxillary sinus tenderness or frontal sinus tenderness.     Left Sinus: No maxillary sinus tenderness or frontal sinus tenderness.     Mouth/Throat:     Pharynx: Uvula midline.  Eyes:     General: Lids are normal. Lids are everted, no foreign bodies appreciated.     Conjunctiva/sclera: Conjunctivae normal.     Pupils: Pupils are equal, round, and reactive to light.  Neck:     Thyroid: No thyroid mass or thyromegaly.     Vascular: No carotid bruit.     Trachea: Trachea normal.  Cardiovascular:     Rate and Rhythm: Normal rate and regular rhythm.     Pulses: Normal pulses.     Heart sounds: S1 normal and S2 normal. Murmur heard.     Systolic murmur is present.     No friction rub. No gallop.  Pulmonary:     Effort: Pulmonary effort is normal. No tachypnea or respiratory distress.     Breath sounds: Normal breath sounds. No decreased breath sounds, wheezing, rhonchi or rales.  Abdominal:     General: Bowel sounds are normal.     Palpations: Abdomen is soft.     Tenderness: There is no abdominal tenderness.  Musculoskeletal:     Cervical back: Normal range of motion and neck supple.     Right lower leg: Edema present.  Skin:    General: Skin is warm and dry.     Findings: No rash.  Neurological:     Mental Status: She is alert.  Psychiatric:        Mood and Affect: Mood is not anxious or depressed.        Speech: Speech normal.        Behavior: Behavior normal. Behavior is cooperative.        Thought Content: Thought content normal.        Judgment: Judgment normal.          Results for orders placed or performed in visit on 09/12/22  POCT INR  Result Value Ref Range   INR 2.8 2.0 - 3.0     COVID 19 screen:  No recent travel or known exposure to COVID19 The patient denies respiratory symptoms of COVID 19 at this time. The importance of social distancing was discussed today.   Assessment and Plan Problem List Items Addressed This  Visit   None Visit Diagnoses     Venous ulcer of right leg ([place])    -  Primary   Relevant Orders   AMB referral to wound care center   Polyneuropathy       Relevant Orders   AMB referral to wound care  center      Nonhealing injury to right lower leg.  Secondary to fall and abrasion initially but poor slow healing due to neuropathy, peripheral edema and venous insufficiency.  No current sign of cellulitis. Ultrasound showed no evidence of DVT. She has had minimal improvement with cleaning the wound, and applying petroleum dressing. I will refer to the wound care clinic for recommendations on more aggressive wound care.  She will continue to elevate her leg above her heart is much as she is able.     [name], MD

## 2022-09-18 NOTE — Patient Instructions (Addendum)
Elevated right leg above hear if able.  No clear sign of  infection.  Continue dressing changes as you have been every other day with showering.  We will move forward with referral to wound care center.

## 2022-10-09 ENCOUNTER — Encounter: Payer: PPO | Attending: Internal Medicine | Admitting: Internal Medicine

## 2022-10-09 DIAGNOSIS — I87311 Chronic venous hypertension (idiopathic) with ulcer of right lower extremity: Secondary | ICD-10-CM | POA: Diagnosis not present

## 2022-10-09 DIAGNOSIS — L97812 Non-pressure chronic ulcer of other part of right lower leg with fat layer exposed: Secondary | ICD-10-CM | POA: Diagnosis not present

## 2022-10-09 DIAGNOSIS — I482 Chronic atrial fibrillation, unspecified: Secondary | ICD-10-CM | POA: Diagnosis not present

## 2022-10-09 DIAGNOSIS — I872 Venous insufficiency (chronic) (peripheral): Secondary | ICD-10-CM | POA: Insufficient documentation

## 2022-10-09 DIAGNOSIS — Z7901 Long term (current) use of anticoagulants: Secondary | ICD-10-CM | POA: Diagnosis not present

## 2022-10-09 NOTE — Progress Notes (Signed)
[name], DEMATTEO [name] ([id number]) [id number] Page 1 of 11 Visit Report for 10/09/2022 Allergy List Details Patient Name: Date of Service: [name], [name] 10/09/2022 10:30 A M Medical Record Number: [id number] Patient Account Number: [id number] Date of Birth/Sex: Treating RN: [birth year] (89 y.o. [name] Primary Care Brayn Eckstein: [name] Other Clinician: Referring Marionna Gonia: Treating Jaysean Manville/Extender: [name], [name] in Treatment: 0 Allergies Active Allergies Sulfa (Sulfonamide Antibiotics) Allergy Notes Electronic Signature(s) Signed: 10/09/2022 5:03:01 PM By: Rosalio Loud MSN RN CNS WTA Previous Signature: 10/09/2022 11:01:03 AM Version By: Rosalio Loud MSN RN CNS WTA Entered By: Rosalio [name] on 10/09/2022 11:29:19 -------------------------------------------------------------------------------- Arrival Information Details Patient Name: Date of Service: [name], [name] [place] 10/09/2022 10:30 A M Medical Record Number: [id number] Patient Account Number: [id number] Date of Birth/Sex: Treating RN: [birth year] (89 y.o. [name] Primary Care Eston Heslin: [name] Other Clinician: Referring Jonus Coble: Treating Oluwadara Gorman/Extender: [name], [name] in Treatment: 0 Visit Information Patient Arrived: Cane Arrival Time: 10:21 Accompanied By: SELF Transfer Assistance: None Patient Identification Verified: Yes Secondary Verification Process Completed: Yes Patient Requires Transmission-Based Precautions: No Patient Has Alerts: Yes Patient Alerts: Patient on Blood Thinner Warfarin History Since Last Visit Added or deleted any medications: No Any new allergies or adverse reactions: No Had a fall or experienced change in activities of daily living that may affect risk of falls: No Hospitalized since last visit: No Has Dressing in Place as Prescribed: Yes Electronic Signature(s) Signed: 10/09/2022 5:03:01 PM By: Rosalio Loud MSN RN CNS [address], [name] ([id number]) PM By: Rosalio Loud MSN RN CNS WTA [phone] Page 2 of 11 Signed: 10/09/2022 5:03:01 Previous Signature: 10/09/2022 11:00:09 AM Version By: Rosalio Loud MSN RN CNS WTA Entered By: Rosalio [name] on 10/09/2022 11:25:18 -------------------------------------------------------------------------------- Clinic Level of Care Assessment Details Patient Name: Date of Service: [name], [place] [name] [place] 10/09/2022 10:30 A M Medical Record Number: [id number] Patient Account Number: [id number] Date of Birth/Sex: Treating RN: [birth year] (89 y.o. [name] Primary Care Deanza Upperman: [name] Other Clinician: Referring Emelynn Rance: Treating Ellenore Roscoe/Extender: [name], [name] in Treatment: 0 Clinic Level of Care Assessment Items TOOL 2 Quantity Score X- 1 0 Use when only an EandM is performed on the INITIAL visit ASSESSMENTS - Nursing Assessment / Reassessment X- 1 20 General Physical Exam (combine w/ comprehensive assessment (listed just below) when performed on new pt. evals) X- 1 25 Comprehensive Assessment (HX, ROS, Risk Assessments, Wounds Hx, etc.) ASSESSMENTS - Wound and Skin A ssessment / Reassessment X - Simple Wound Assessment / Reassessment - one wound 1 5 '[]'$  - 0 Complex Wound Assessment / Reassessment - multiple wounds '[]'$  - 0 Dermatologic / Skin Assessment (not related to wound area) ASSESSMENTS - Ostomy and/or Continence Assessment and Care '[]'$  - 0 Incontinence Assessment and Management '[]'$  - 0 Ostomy Care Assessment and Management (repouching, etc.) PROCESS - Coordination of Care X - Simple Patient / Family Education for ongoing care 1 15 '[]'$  - 0 Complex (extensive) Patient / Family Education for ongoing care X- 1 10 Staff obtains Programmer, systems, Records, T Results / Process Orders est '[]'$  - 0 Staff telephones HHA, Nursing Homes / Clarify orders / etc '[]'$  - 0 Routine Transfer to another Facility  (non-emergent condition) '[]'$  - 0 Routine Hospital Admission (non-emergent condition) X- 1 15 New Admissions / Biomedical engineer / Ordering NPWT Apligraf, etc. , '[]'$  - 0 Emergency Hospital Admission (emergent condition) X- 1 10 Simple Discharge Coordination '[]'$  -  0 Complex (extensive) Discharge Coordination PROCESS - Special Needs '[]'$  - 0 Pediatric / Minor Patient Management '[]'$  - 0 Isolation Patient Management '[]'$  - 0 Hearing / Language / Visual special needs '[]'$  - 0 Assessment of Community assistance (transportation, D/C planning, etc.) '[]'$  - 0 Additional assistance / Altered mentation Delph, [name] ([id number]) [id number] Page 3 of 11 '[]'$  - 0 Support Surface(s) Assessment (bed, cushion, seat, etc.) INTERVENTIONS - Wound Cleansing / Measurement X- 1 5 Wound Imaging (photographs - any number of wounds) '[]'$  - 0 Wound Tracing (instead of photographs) X- 1 5 Simple Wound Measurement - one wound '[]'$  - 0 Complex Wound Measurement - multiple wounds X- 1 5 Simple Wound Cleansing - one wound '[]'$  - 0 Complex Wound Cleansing - multiple wounds INTERVENTIONS - Wound Dressings X - Small Wound Dressing one or multiple wounds 1 10 '[]'$  - 0 Medium Wound Dressing one or multiple wounds '[]'$  - 0 Large Wound Dressing one or multiple wounds '[]'$  - 0 Application of Medications - injection INTERVENTIONS - Miscellaneous '[]'$  - 0 External ear exam '[]'$  - 0 Specimen Collection (cultures, biopsies, blood, body fluids, etc.) '[]'$  - 0 Specimen(s) / Culture(s) sent or taken to Lab for analysis '[]'$  - 0 Patient Transfer (multiple staff / Civil Service fast streamer / Similar devices) '[]'$  - 0 Simple Staple / Suture removal (25 or less) '[]'$  - 0 Complex Staple / Suture removal (26 or more) '[]'$  - 0 Hypo / Hyperglycemic Management (close monitor of Blood Glucose) X- 1 15 Ankle / Brachial Index (ABI) - do not check if billed separately Has the patient been seen at the hospital within the last three  years: Yes Total Score: 140 Level Of Care: New/Established - Level 4 Electronic Signature(s) Signed: 10/09/2022 5:03:01 PM By: Rosalio Loud MSN RN CNS WTA Entered By: Rosalio [name] on 10/09/2022 12:53:35 -------------------------------------------------------------------------------- Encounter Discharge Information Details Patient Name: Date of Service: [name], [name] [place] 10/09/2022 10:30 A M Medical Record Number: [id number] Patient Account Number: [id number] Date of Birth/Sex: Treating RN: 1932-01-12 (89 y.o. [name] Primary Care Akaila Rambo: [name] Other Clinician: Referring Haskel Dewalt: Treating Edgel Degnan/Extender: [name], [name] in Treatment: 0 Encounter Discharge Information Items Post Procedure Vitals Discharge Condition: Stable Temperature (F): 97.9 Ambulatory Status: Cane Pulse (bpm): 80 Discharge Destination: Home Respiratory Rate (breaths/min): 16 Transportation: Other Blood Pressure (mmHg): 133/80 Accompanied By: self Schedule Follow-up Appointment: Yes Clinical Summary of CareKYIESHA, MILLWARD ([id number]) [id number] Page 4 of 11 Electronic Signature(s) Signed: 10/09/2022 12:54:15 PM By: Rosalio Loud MSN RN CNS WTA Previous Signature: 10/09/2022 12:52:39 PM Version By: Rosalio Loud MSN RN CNS WTA Previous Signature: 10/09/2022 12:15:02 PM Version By: Rosalio Loud MSN RN CNS WTA Entered By: Rosalio [name] on 10/09/2022 12:54:15 -------------------------------------------------------------------------------- Lower Extremity Assessment Details Patient Name: Date of Service: [name], [name] 10/09/2022 10:30 A M Medical Record Number: [id number] Patient Account Number: [id number] Date of Birth/Sex: Treating RN: Sep 22, 1931 (89 y.o. [name] Primary Care Neva Ramaswamy: [name] Other Clinician: Referring Trevor Duty: Treating Tanazia Achee/Extender: [name], [name] in Treatment: 0 Edema  Assessment Assessed: [Left: No] [Right: Yes] [Left: Edema] [Right: :] Calf Left: Right: Point of Measurement: 34 cm From Medial Instep 31.6 cm Ankle Left: Right: Point of Measurement: 12 cm From Medial Instep 20 cm Vascular Assessment Pulses: Dorsalis Pedis Palpable: [Right:Yes] Blood Pressure: Brachial: [Right:110] Ankle: [Right:Dorsalis Pedis: 84 0.76] Electronic Signature(s) Signed: 10/09/2022 5:03:01 PM By: Rosalio Loud MSN RN CNS WTA Previous Signature: 10/09/2022 11:00:57  AM Version By: Rosalio Loud MSN RN CNS WTA Entered By: Rosalio [name] on 10/09/2022 11:29:12 Multi Wound Chart Details -------------------------------------------------------------------------------- [name] ([id number]) [id number] Page 5 of 11 Patient Name: Date of Service: [name], [name] 10/09/2022 10:30 A M Medical Record Number: [id number] Patient Account Number: [id number] Date of Birth/Sex: Treating RN: August 05, 1932 (89 y.o. [name] Primary Care Lanai Conlee: [name] Other Clinician: Referring Serenah Mill: Treating Dmonte Maher/Extender: [name], [name] in Treatment: 0 Vital Signs Height(in): 68 Pulse(bpm): 80 Weight(lbs): 137 Blood Pressure(mmHg): 133/80 Body Mass Index(BMI): 20.8 Temperature(F): 97.9 Respiratory Rate(breaths/min): 16 Wound Assessments Wound Number: 2 N/A N/A Photos: N/A N/A Right, Anterior Lower Leg N/A N/A Wound Location: Skin Tear/Laceration N/A N/A Wounding Event: Skin Tear N/A N/A Primary Etiology: 08/30/2022 N/A N/A Date Acquired: 0 N/A N/A Weeks of Treatment: Open N/A N/A Wound Status: No N/A N/A Wound Recurrence: 7.5x5.5x0.1 N/A N/A Measurements L x W x D (cm) 32.398 N/A N/A A (cm) : rea 3.24 N/A N/A Volume (cm) : Full Thickness Without Exposed N/A N/A Classification: Support Structures Large N/A N/A Exudate Amount: Serosanguineous N/A N/A Exudate Type: red, brown N/A N/A Exudate  Color: Yes N/A N/A Foul Odor A Cleansing: fter No N/A N/A Odor Anticipated Due to Product Use: Small (1-33%) N/A N/A Granulation A mount: Red, Pink N/A N/A Granulation Quality: Large (67-100%) N/A N/A Necrotic Amount: Fat Layer (Subcutaneous Tissue): Yes N/A N/A Exposed Structures: Fascia: No Tendon: No Muscle: No Joint: No Bone: No Small (1-33%) N/A N/A Epithelialization: Treatment Notes Wound #2 (Lower Leg) Wound Laterality: Right, Anterior Cleanser Wound Cleanser Discharge Instruction: Wash your hands with soap and water. Remove old dressing, discard into plastic bag and place into trash. Cleanse the wound with Wound Cleanser prior to applying a clean dressing using gauze sponges, not tissues or cotton balls. Do not scrub or use excessive force. Pat dry using gauze sponges, not tissue or cotton balls. Peri-Wound Care AandD Ointment Discharge Instruction: Apply AandD Ointment as directed Topical Santyl Collagenase Ointment, 30 (gm), tube Discharge Instruction: apply nickel thick to wound bed only Primary Dressing Hydrofera Blue Ready Transfer Foam, 4x5 (in/in) Discharge Instruction: Apply Hydrofera Blue Ready to wound bed as directed Secondary Dressing ABD Pad 5x9 (in/in) Favaro, [name] ([id number]) [id number] Page 6 of 11 Discharge Instruction: Cover with ABD pad Secured With [employer] Sterile or Non-Sterile 6-ply 4.5x4 (yd/yd) Discharge Instruction: Apply Kerlix as directed Compression Wrap Compression Stockings Add-Ons Electronic Signature(s) Signed: 10/09/2022 3:12:59 PM By: Kalman Shan DO Previous Signature: 10/09/2022 11:03:25 AM Version By: Rosalio Loud MSN RN CNS WTA Entered By: Kalman Shan on 10/09/2022 12:33:08 -------------------------------------------------------------------------------- Multi-Disciplinary Care Plan Details Patient Name: Date of Service: [name], [name] 10/09/2022 10:30 A M Medical Record  Number: [id number] Patient Account Number: [id number] Date of Birth/Sex: Treating RN: 11/04/1931 (89 y.o. [name] Primary Care Avir Deruiter: [name] Other Clinician: Referring Arrion Broaddus: Treating Jaymee Tilson/Extender: [name], [name] in Treatment: 0 Active Inactive Necrotic Tissue Nursing Diagnoses: Impaired tissue integrity related to necrotic/devitalized tissue Knowledge deficit related to management of necrotic/devitalized tissue Goals: Necrotic/devitalized tissue will be minimized in the wound bed Date Initiated: 10/09/2022 Target Resolution Date: 11/09/2022 Goal Status: Active Patient/caregiver will verbalize understanding of reason and process for debridement of necrotic tissue Date Initiated: 10/09/2022 Target Resolution Date: 11/09/2022 Goal Status: Active Interventions: Assess patient pain level pre-, during and post procedure and prior to discharge Provide education on necrotic tissue and debridement process Treatment Activities: Apply  topical anesthetic as ordered : 10/09/2022 Excisional debridement : 10/09/2022 Notes: Venous Leg Ulcer Nursing Diagnoses: Actual venous Insuffiency (use after diagnosis is confirmed) Knowledge deficit related to disease process and management Goals: Patient will maintain optimal edema control JENA, TEGELER ([id number]) [id number] Page 7 of 11 Date Initiated: 10/09/2022 Target Resolution Date: 11/09/2022 Goal Status: Active Patient/caregiver will verbalize understanding of disease process and disease management Date Initiated: 10/09/2022 Target Resolution Date: 11/09/2022 Goal Status: Active Verify adequate tissue perfusion prior to therapeutic compression application Date Initiated: 10/09/2022 Target Resolution Date: 11/09/2022 Goal Status: Active Interventions: Assess peripheral edema status every visit. Provide education on venous insufficiency Treatment Activities: T ordered outside  of clinic : 10/09/2022 est Notes: Wound/Skin Impairment Nursing Diagnoses: Impaired tissue integrity Knowledge deficit related to ulceration/compromised skin integrity Goals: Patient will have a decrease in wound volume by X% from date: (specify in notes) Date Initiated: 10/09/2022 Target Resolution Date: 11/09/2022 Goal Status: Active Patient/caregiver will verbalize understanding of skin care regimen Date Initiated: 10/09/2022 Target Resolution Date: 11/09/2022 Goal Status: Active Ulcer/skin breakdown will have a volume reduction of 30% by week 4 Date Initiated: 10/09/2022 Target Resolution Date: 11/09/2022 Goal Status: Active Ulcer/skin breakdown will have a volume reduction of 50% by week 8 Date Initiated: 10/09/2022 Target Resolution Date: 12/08/2022 Goal Status: Active Ulcer/skin breakdown will have a volume reduction of 80% by week 12 Date Initiated: 10/09/2022 Target Resolution Date: 01/08/2023 Goal Status: Active Ulcer/skin breakdown will heal within 14 weeks Date Initiated: 10/09/2022 Target Resolution Date: 01/22/2023 Goal Status: Active Interventions: Assess patient/caregiver ability to obtain necessary supplies Assess patient/caregiver ability to perform ulcer/skin care regimen upon admission and as needed Assess ulceration(s) every visit Provide education on ulcer and skin care Treatment Activities: Skin care regimen initiated : 10/09/2022 Notes: Electronic Signature(s) Signed: 10/09/2022 12:51:29 PM By: Rosalio Loud MSN RN CNS WTA Previous Signature: 10/09/2022 11:06:43 AM Version By: Rosalio Loud MSN RN CNS WTA Entered By: Rosalio [name] on 10/09/2022 12:51:29 Pain Assessment Details -------------------------------------------------------------------------------- [name] ([id number]) [id number] Page 8 of 11 Patient Name: Date of Service: [name], [name] 10/09/2022 10:30 A M Medical Record Number: [id number] Patient Account Number:  [id number] Date of Birth/Sex: Treating RN: [birth year] (89 y.o. [name] Primary Care Trenden Hazelrigg: [name] Other Clinician: Referring Gabe Glace: Treating Lawrance Wiedemann/Extender: [name], [name] in Treatment: 0 Active Problems Location of Pain Severity and Description of Pain Patient Has Paino No Site Locations With Dressing Change: No Character of Pain Describe the Pain: Tender Pain Management and Medication Current Pain Management: Electronic Signature(s) Signed: 10/09/2022 5:03:01 PM By: Rosalio Loud MSN RN CNS WTA Previous Signature: 10/09/2022 11:00:15 AM Version By: Rosalio Loud MSN RN CNS WTA Entered By: Rosalio [name] on 10/09/2022 11:25:26 -------------------------------------------------------------------------------- Patient/Caregiver Education Details Patient Name: Date of Service: [name], [name] [place] 1/24/2024andnbsp10:30 A M Medical Record Number: [id number] Patient Account Number: [id number] Date of Birth/Gender: Treating RN: [birth year] (89 y.o. [name] Primary Care Physician: [name] Other Clinician: Referring Physician: Treating Physician/Extender: Weyman Croon Weeks in Treatment: 0 Education Assessment Education Provided To: Patient Education Topics Provided Venous: Handouts: Managing Venous Insufficiency Methods: Explain/Verbal COLENA, KETTERMAN ([id number]) [id number] Page 9 of 11 Responses: State content correctly Wound Debridement: Handouts: Wound Debridement Methods: Explain/Verbal Responses: State content correctly Wound/Skin Impairment: Handouts: Caring for Your Ulcer Methods: Explain/Verbal Responses: State content correctly Electronic Signature(s) Signed: 10/09/2022 5:03:01 PM By: Rosalio Loud MSN RN CNS WTA Entered By: Rosalio [name] on 10/09/2022 12:51:23 --------------------------------------------------------------------------------  Wound Assessment Details Patient Name:  Date of Service: [name], [name] 10/09/2022 10:30 A M Medical Record Number: [id number] Patient Account Number: [id number] Date of Birth/Sex: Treating RN: [birth year] (89 y.o. [name] Primary Care Findley Vi: [name] Other Clinician: Referring Charmayne Odell: Treating Saliou Barnier/Extender: [name], [name] in Treatment: 0 Wound Status Wound Number: 2 Primary Etiology: Skin Tear Wound Location: Right, Anterior Lower Leg Wound Status: Open Wounding Event: Skin Tear/Laceration Date Acquired: 08/30/2022 Weeks Of Treatment: 0 Clustered Wound: No Photos Wound Measurements Length: (cm) 7.5 Width: (cm) 5.5 Depth: (cm) 0.1 Area: (cm) 32.398 Volume: (cm) 3.24 % Reduction in Area: % Reduction in Volume: Epithelialization: Small (1-33%) Tunneling: No Undermining: No Wound Description Classification: Full Thickness Without Exposed Support Structures Exudate Amount: Large Exudate Type: Serosanguineous Exudate Color: red, brown Greeley, [name] ([id number]) Foul Odor After Cleansing: Yes Due to Product Use: No Slough/Fibrino Yes [phone] Page 10 of 11 Wound Bed Granulation Amount: Small (1-33%) Exposed Structure Granulation Quality: Red, Pink Fascia Exposed: No Necrotic Amount: Large (67-100%) Fat Layer (Subcutaneous Tissue) Exposed: Yes Necrotic Quality: Adherent Slough Tendon Exposed: No Muscle Exposed: No Joint Exposed: No Bone Exposed: No Treatment Notes Wound #2 (Lower Leg) Wound Laterality: Right, Anterior Cleanser Wound Cleanser Discharge Instruction: Wash your hands with soap and water. Remove old dressing, discard into plastic bag and place into trash. Cleanse the wound with Wound Cleanser prior to applying a clean dressing using gauze sponges, not tissues or cotton balls. Do not scrub or use excessive force. Pat dry using gauze sponges, not tissue or cotton balls. Peri-Wound Care AandD Ointment Discharge  Instruction: Apply AandD Ointment as directed Topical Santyl Collagenase Ointment, 30 (gm), tube Discharge Instruction: apply nickel thick to wound bed only Primary Dressing Hydrofera Blue Ready Transfer Foam, 4x5 (in/in) Discharge Instruction: Apply Hydrofera Blue Ready to wound bed as directed Secondary Dressing ABD Pad 5x9 (in/in) Discharge Instruction: Cover with ABD pad Secured With [employer] T ape ape, 2x2 (in/yd) Kerlix Roll Sterile or Non-Sterile 6-ply 4.5x4 (yd/yd) Discharge Instruction: Apply Kerlix as directed Compression Wrap Compression Stockings Add-Ons Electronic Signature(s) Signed: 10/09/2022 5:03:01 PM By: Rosalio Loud MSN RN CNS WTA Entered By: Rosalio [name] on 10/09/2022 10:37:28 -------------------------------------------------------------------------------- Vitals Details Patient Name: Date of Service: [name], [name] 10/09/2022 10:30 A M Medical Record Number: [id number] Patient Account Number: [id number] Date of Birth/Sex: Treating RN: Nov 27, 1931 (89 y.o. [name] Primary Care Kaydra Borgen: [name] Other Clinician: Referring Lavonne Kinderman: Treating Julion Gatt/Extender: [name], [name] in Treatment: 0 Vital Signs Pfost, [name] ([id number]) [id number] Page 11 of 11 Time Taken: 10:22 Temperature (F): 97.9 Height (in): 68 Pulse (bpm): 80 Source: Stated Respiratory Rate (breaths/min): 16 Weight (lbs): 137 Blood Pressure (mmHg): 133/80 Source: Stated Reference Range: 80 - 120 mg / dl Body Mass Index (BMI): 20.8 Electronic Signature(s) Signed: 10/09/2022 5:03:01 PM By: Rosalio Loud MSN RN CNS WTA Previous Signature: 10/09/2022 11:00:20 AM Version By: Rosalio Loud MSN RN CNS WTA Entered By: Rosalio [name] on 10/09/2022 11:28:54

## 2022-10-09 NOTE — Progress Notes (Signed)
ALEXAS, BASULTO [place] ([id number]) [id number] Page 1 of 9 Visit Report for 10/09/2022 Chief Complaint Document Details Patient Name: Date of Service: [name], [name] 10/09/2022 10:30 A M Medical Record Number: [id number] Patient Account Number: [id number] Date of Birth/Sex: Treating RN: [birth year] (89 y.o. [name] Primary Care Provider: [name] Other Clinician: Referring Provider: Treating Provider/Extender: [name], [name] in Treatment: 0 Information Obtained from: Patient Chief Complaint 10/09/2022; right lower extremity wound Electronic Signature(s) Signed: 10/09/2022 3:12:59 PM By: Kalman Shan DO Entered By: Kalman Shan on 10/09/2022 12:33:25 -------------------------------------------------------------------------------- Debridement Details Patient Name: Date of Service: [name], [name] 10/09/2022 10:30 A M Medical Record Number: [id number] Patient Account Number: [id number] Date of Birth/Sex: Treating RN: Dec 10, 1931 (89 y.o. [name] Primary Care Provider: [name] Other Clinician: Referring Provider: Treating Provider/Extender: [name], [name] in Treatment: 0 Debridement Performed for Assessment: Wound #2 Right,Anterior Lower Leg Performed By: Physician Kalman Shan, MD Debridement Type: Chemical/Enzymatic/Mechanical Agent Used: Santyl Level of Consciousness (Pre-procedure): Awake and Alert Pre-procedure Verification/Time Out No Taken: Instrument: Other : saline gauze Bleeding: Minimum Hemostasis Achieved: Pressure Response to Treatment: Procedure was tolerated well Level of Consciousness (Post- Awake and Alert procedure): Post Debridement Measurements of Total Wound Length: (cm) 7.5 Width: (cm) 5.5 Depth: (cm) 0.2 [place], [name] ([id number]) [id number] Page 2 of 9 Volume: (cm) 6.48 Character of Wound/Ulcer Post Debridement:  Stable Post Procedure Diagnosis Same as Pre-procedure Electronic Signature(s) Signed: 10/09/2022 12:47:43 PM By: Rosalio Loud MSN RN CNS WTA Signed: 10/09/2022 3:12:59 PM By: Kalman Shan DO Entered By: Rosalio Loud on 10/09/2022 12:47:42 -------------------------------------------------------------------------------- HPI Details Patient Name: Date of Service: [name], [name] 10/09/2022 10:30 A M Medical Record Number: [id number] Patient Account Number: [id number] Date of Birth/Sex: Treating RN: Jan 31, 1932 (89 y.o. [name] Primary Care Provider: [name] Other Clinician: Referring Provider: Treating Provider/Extender: [name], [name] in Treatment: 0 History of Present Illness HPI Description: 05/08/2021 upon evaluation today patient appears for initial inspection here in the clinic concerning issues that she has been having actually with a wound over the anterior portion of her right leg. She tells me that this was due to an injury where she struck this on something causing the initial trauma and she has been having trouble getting it healed. [name] over at [facility] has been helping take care of this. With that being said because it was not healing quite as quickly and effectively is what she wanted to see she did want [place] to take a look and make any recommendations to try to help things along. I am definitely happy to do that for sure. Fortunately there does not appear to be any signs of active infection at this time which is great news. The patient also does not seem to have too deep of the wound which is also good news. Her biggest concern is she really does not want to come weekly to the clinic especially since she has care at the [facility] location as far as getting the dressing changed and keeping an eye on it. She does have a history of atrial fibrillation for which she is on warfarin and she does not like to do water aerobics twice a week although  she is not able to right now due to the fact that she has this wound and cannot get in the water. That is a significant issue for her at this point. Otherwise does have  noncompressible pulses based on what we are seeing today. She wears compression socks for her chronic venous insufficiency she has had previous ablation therapy. 05/29/2021 upon evaluation today patient appears to be doing well with regard to her wound. Overall this is actually showing signs of good improvement with significant overall decrease in size since last week even. Overall I feel like that she is doing awesome and making excellent progress. Over at [facility] they been taking good care of her. 06/14/2021 upon evaluation today patient appears to be doing excellent in regard to her wound. In fact she is completely healed based on what we are seeing currently. I do not see any signs of active infection at this time which is great news. 10/05/2022 [name] is an 89 year old female with a past medical history of chronic venous insufficiency and atrial fibrillation on warfarin that presents the clinic for a 1-5-monthhistory of nonhealing ulcer to the anterior aspect of the right lower extremity. She states she hit her leg against an object in the beginning of December. She describes what started out as a hematoma and was debrided by a physician in her facility. She has been using Xeroform to the wound bed. She currently denies signs of infection. She has been wearing her compression stocking. Electronic Signature(s) Signed: 10/09/2022 3:12:59 PM By: HKalman ShanDO Entered By: HKalman Shanon 10/09/2022 12:35:04 Goetsch, MKarle Starch([id number]) [id number] Page 3 of 9 -------------------------------------------------------------------------------- Physical Exam Details Patient Name: Date of Service: HO [place]ie [name] 10/09/2022 10:30 A M Medical Record Number: [id number] Account  Number: [id number] of Birth/Sex: Treating RN: [birth year](89y.o. FDrema PryPrimary Care Provider: BEliezer LoftsOther Clinician: Referring Provider: Treating Provider/Extender: HArmandina Stammer [name] in Treatment: 0 Constitutional . Psychiatric . Notes Right lower extremity: T the anterior aspect there is an open wound with granulation tissue and mostly nonviable tissue throughout the surface. 2+ pitting o edema to the knee. No surrounding signs of infection including increased warmth, erythema or purulent drainage. Venous stasis dermatitis throughout the leg. Difficult to palpate pedal pulses Electronic Signature(s) Signed: 10/09/2022 3:12:59 PM By: HKalman ShanDO Entered By: HKalman Shanon 10/09/2022 12:36:41 -------------------------------------------------------------------------------- Physician Orders Details Patient Name: Date of Service: [name], [name] 10/09/2022 10:30 A M Medical Record Number: [id number] Account Number: [id number] of Birth/Sex: Treating RN: [birth year](89y.o. FDrema PryPrimary Care Provider: BEliezer LoftsOther Clinician: Referring Provider: Treating Provider/Extender: HArmandina Stammer [name] in Treatment: 0 Verbal / Phone Orders: No Diagnosis Coding ICD-10 Coding Code Description [phone]-pressure chronic ulcer of other part of right lower leg with fat layer exposed I87.311 Chronic venous hypertension (idiopathic) with ulcer of right lower extremity I48.20 Chronic atrial fibrillation, unspecified Z79.01 Long term (current) use of anticoagulants Follow-up Appointments Return Appointment in 1 week. Anesthetic (Use 'Patient Medications' Section for Anesthetic Order Entry) Lidocaine applied to wound bed HNiasia, LanphearMKarle Starch([id number] Page 4 of 9 Wound Treatment Wound #2 - Lower Leg Wound Laterality: Right, Anterior Cleanser: Wound Cleanser 1 x Per  Day/30 Days Discharge Instructions: Wash your hands with soap and water. Remove old dressing, discard into plastic bag and place into trash. Cleanse the wound with Wound Cleanser prior to applying a clean dressing using gauze sponges, not tissues or cotton balls. Do not scrub or use excessive force. Pat dry using gauze sponges, not tissue or cotton balls. Peri-Wound Care: AandD Ointment 1 x Per  Day/30 Days Discharge Instructions: Apply AandD Ointment as directed Topical: Santyl Collagenase Ointment, 30 (gm), tube 1 x Per Day/30 Days Discharge Instructions: apply nickel thick to wound bed only Prim Dressing: Hydrofera Blue Ready Transfer Foam, 4x5 (in/in) (DME) (Dispense As Written) 1 x Per Day/30 Days ary Discharge Instructions: Apply Hydrofera Blue Ready to wound bed as directed Secondary Dressing: ABD Pad 5x9 (in/in) (DME) (Generic) 1 x Per Day/30 Days Discharge Instructions: Cover with ABD pad Secured With: Medipore T - 63M Medipore H Soft Cloth Surgical T ape ape, 2x2 (in/yd) (DME) (Generic) 1 x Per Day/30 Days Secured With: Kerlix Roll Sterile or Non-Sterile 6-ply 4.5x4 (yd/yd) (DME) (Generic) 1 x Per Day/30 Days Discharge Instructions: Apply Kerlix as directed Services and Therapies nkle Brachial Index (ABI) - (ICD10 I87.311 - Chronic venous hypertension (idiopathic) with ulcer of right lower extremity) A Patient Medications llergies: Sulfa (Sulfonamide Antibiotics) A Notifications Medication Indication Start End 10/09/2022 Santyl DOSE 1 - topical 250 unit/gram ointment - apply to wound bed daily x 15 days Electronic Signature(s) Signed: 10/09/2022 12:53:31 PM By: Rosalio Loud MSN RN CNS WTA Signed: 10/09/2022 3:12:59 PM By: Kalman Shan DO Previous Signature: 10/09/2022 12:48:54 PM Version By: Rosalio Loud MSN RN CNS WTA Previous Signature: 10/09/2022 12:14:12 PM Version By: Rosalio Loud MSN RN CNS WTA Previous Signature: 10/09/2022 12:07:22 PM Version By: Kalman Shan  DO Entered By: Rosalio Loud on 10/09/2022 12:53:30 -------------------------------------------------------------------------------- Problem List Details Patient Name: Date of Service: [name], [name] 10/09/2022 10:30 A M Medical Record Number: [id number] Patient Account Number: [id number] Date of Birth/Sex: Treating RN: 1932-05-14 (89 y.o. [name] Primary Care Provider: [name] Other Clinician: Referring Provider: Treating Provider/Extender: [name], [name] in Treatment: 0 Active Problems ICD-10 Encounter Code Description Active Date MDM Diagnosis [phone] Non-pressure chronic ulcer of other part of right lower leg with fat layer 10/09/2022 No Yes [place], [name] ([id number]) [id number] Page 5 of 9 exposed I87.311 Chronic venous hypertension (idiopathic) with ulcer of right lower extremity 10/09/2022 No Yes I48.20 Chronic atrial fibrillation, unspecified 10/09/2022 No Yes Z79.01 Long term (current) use of anticoagulants 10/09/2022 No Yes Inactive Problems Resolved Problems Electronic Signature(s) Signed: 10/09/2022 3:12:59 PM By: Kalman Shan DO Entered By: Kalman Shan on 10/09/2022 12:33:02 -------------------------------------------------------------------------------- Progress Note Details Patient Name: Date of Service: [name], [name] 10/09/2022 10:30 A M Medical Record Number: [id number] Patient Account Number: [id number] Date of Birth/Sex: Treating RN: Feb 09, 1932 (89 y.o. [name] Primary Care Provider: [name] Other Clinician: Referring Provider: Treating Provider/Extender: [name], [name] in Treatment: 0 Subjective Chief Complaint Information obtained from Patient 10/09/2022; right lower extremity wound History of Present Illness (HPI) 05/08/2021 upon evaluation today patient appears for initial inspection here in the clinic concerning issues that she has been having  actually with a wound over the anterior portion of her right leg. She tells me that this was due to an injury where she struck this on something causing the initial trauma and she has been having trouble getting it healed. [name] over at [facility] has been helping take care of this. With that being said because it was not healing quite as quickly and effectively is what she wanted to see she did want [place] to take a look and make any recommendations to try to help things along. I am definitely happy to do that for sure. Fortunately there does not appear to be any signs of active infection at this time which  is great news. The patient also does not seem to have too deep of the wound which is also good news. Her biggest concern is she really does not want to come weekly to the clinic especially since she has care at the [facility] location as far as getting the dressing changed and keeping an eye on it. She does have a history of atrial fibrillation for which she is on warfarin and she does not like to do water aerobics twice a week although she is not able to right now due to the fact that she has this wound and cannot get in the water. That is a significant issue for her at this point. Otherwise does have noncompressible pulses based on what we are seeing today. She wears compression socks for her chronic venous insufficiency she has had previous ablation therapy. 05/29/2021 upon evaluation today patient appears to be doing well with regard to her wound. Overall this is actually showing signs of good improvement with significant overall decrease in size since last week even. Overall I feel like that she is doing awesome and making excellent progress. Over at [facility] they been taking good care of her. 06/14/2021 upon evaluation today patient appears to be doing excellent in regard to her wound. In fact she is completely healed based on what we are seeing currently. I do not see any signs of active  infection at this time which is great news. 10/05/2022 [name] is an 89 year old female with a past medical history of chronic venous insufficiency and atrial fibrillation on warfarin that presents the clinic for a 1-35-monthhistory of nonhealing ulcer to the anterior aspect of the right lower extremity. She states she hit her leg against an object in the beginning of December. She describes what started out as a hematoma and was debrided by a physician in her facility. She has been using Xeroform to the wound bed. She currently denies signs of infection. She has been wearing her compression stocking. HROSSLYN, PASIONJ[place]([id number] Page 6 of 9 Patient History Information obtained from Patient. Allergies Sulfa (Sulfonamide Antibiotics) Social History Former smoker, Alcohol Use - Never, Drug Use - No History, Caffeine Use - Rarely. Medical History Eyes Patient has history of Cataracts Cardiovascular Patient has history of Arrhythmia - Afib Endocrine Denies history of Type I Diabetes, Type II Diabetes Musculoskeletal Patient has history of Osteoarthritis Neurologic Patient has history of Neuropathy Review of Systems (ROS) Endocrine Complains or has symptoms of Thyroid disease. Objective Constitutional Vitals Time Taken: 10:22 AM, Height: 68 in, Source: Stated, Weight: 137 lbs, Source: Stated, BMI: 20.8, Temperature: 97.9 F, Pulse: 80 bpm, Respiratory Rate: 16 breaths/min, Blood Pressure: 133/80 mmHg. General Notes: Right lower extremity: T the anterior aspect there is an open wound with granulation tissue and mostly nonviable tissue throughout the surface. o 2+ pitting edema to the knee. No surrounding signs of infection including increased warmth, erythema or purulent drainage. Venous stasis dermatitis throughout the leg. Difficult to palpate pedal pulses Integumentary (Hair, Skin) Wound #2 status is Open. Original cause of wound was  Skin T ear/Laceration. The date acquired was: 08/30/2022. The wound is located on the Right,Anterior Lower Leg. The wound measures 7.5cm length x 5.5cm width x 0.1cm depth; 32.398cm^2 area and 3.24cm^3 volume. There is Fat Layer (Subcutaneous Tissue) exposed. There is no tunneling or undermining noted. There is a large amount of serosanguineous drainage noted. Foul odor after cleansing was noted. There is small (1-33%) red, pink granulation  within the wound bed. There is a large (67-100%) amount of necrotic tissue within the wound bed including Adherent Slough. Assessment Active Problems ICD-10 Non-pressure chronic ulcer of other part of right lower leg with fat layer exposed Chronic venous hypertension (idiopathic) with ulcer of right lower extremity Chronic atrial fibrillation, unspecified Long term (current) use of anticoagulants Patient presents with a 1-35-monthhistory of nonhealing right lower extremity ulcer secondary to trauma and complicated by venous insufficiency. Her ABIs in office were 0.76. I recommended formal ABIs with TBI's. For now I recommended Santyl and Hydrofera Blue Daily dressing changes with her compression stockings daily. Follow-up in 1 week. Plan Follow-up Appointments: Return Appointment in 1 week. Anesthetic (Use 'Patient Medications' Section for Anesthetic Order Entry): Lidocaine applied to wound bed Services and Therapies ordered were: Ankle Brachial Index (ABI) The following medication(s) was prescribed: Santyl topical 250 unit/gram ointment 1 apply to wound bed daily x 15 days starting 10/09/2022 WOUND #2: - Lower Leg Wound Laterality: Right, Anterior Cleanser: Wound Cleanser 1 x Per Day/30 Days HSHIASIA, PORRO([id number] Page 7 of 9 Discharge Instructions: Wash your hands with soap and water. Remove old dressing, discard into plastic bag and place into trash. Cleanse the wound with Wound Cleanser prior to applying a  clean dressing using gauze sponges, not tissues or cotton balls. Do not scrub or use excessive force. Pat dry using gauze sponges, not tissue or cotton balls. Peri-Wound Care: AandD Ointment 1 x Per Day/30 Days Discharge Instructions: Apply AandD Ointment as directed Topical: Santyl Collagenase Ointment, 30 (gm), tube 1 x Per Day/30 Days Discharge Instructions: apply nickel thick to wound bed only Prim Dressing: Hydrofera Blue Ready Transfer Foam, 4x5 (in/in) 1 x Per Day/30 Days ary Discharge Instructions: Apply Hydrofera Blue Ready to wound bed as directed Secondary Dressing: ABD Pad 5x9 (in/in) 1 x Per Day/30 Days Discharge Instructions: Cover with ABD pad Secured With: Kerlix Roll Sterile or Non-Sterile 6-ply 4.5x4 (yd/yd) 1 x Per Day/30 Days Discharge Instructions: Apply Kerlix as directed 1. Santyl and Hydrofera Blue 2. Compression stocking daily 3. ABIs with TBI's 4. Follow-up in 1 week Electronic Signature(s) Signed: 10/09/2022 3:12:59 PM By: HKalman ShanDO Entered By: HKalman Shanon 10/09/2022 12:41:44 -------------------------------------------------------------------------------- ROS/PFSH Details Patient Name: Date of Service: [name], [name] 10/09/2022 10:30 A M Medical Record Number: [id number] Account Number: [id number] of Birth/Sex: Treating RN: [birth year](89y.o. FDrema PryPrimary Care Provider: BEliezer LoftsOther Clinician: Referring Provider: Treating Provider/Extender: HArmandina Stammer [name] in Treatment: 0 Information Obtained From Patient Endocrine Complaints and Symptoms: Positive for: Thyroid disease Medical History: Negative for: Type I Diabetes; Type II Diabetes Eyes Medical History: Positive for: Cataracts Cardiovascular Medical History: Positive for: Arrhythmia - Afib Musculoskeletal Medical History: Positive for: Osteoarthritis Neurologic Medical History: Positive for: Neuropathy HELAN, MCELVAIN ([id number] Page 8 of 9 HBO Extended History Items Eyes: Cataracts Immunizations Pneumococcal Vaccine: Received Pneumococcal Vaccination: Yes Received Pneumococcal Vaccination On or After 60th Birthday: Yes Implantable Devices None Family and Social History Former smoker; Alcohol Use: Never; Drug Use: No History; Caffeine Use: Rarely Electronic Signature(s) Signed: 10/09/2022 3:12:59 PM By: HKalman ShanDO Signed: 10/09/2022 5:03:01 PM By: SRosalio LoudMSN RN CNS WTA Entered By: SRosalio Loudon 10/09/2022 11:29:40 -------------------------------------------------------------------------------- SuperBill Details Patient Name: Date of Service: [name], [name] 10/09/2022 Medical Record Number: [id number] Account Number: [id number] of Birth/Sex: Treating RN: 408/10/1931(89y.o. FDrema PryPrimary  Care Provider: [name] Other Clinician: Referring Provider: Treating Provider/Extender: [name], [name] in Treatment: 0 Diagnosis Coding ICD-10 Codes Code Description [phone] Non-pressure chronic ulcer of other part of right lower leg with fat layer exposed I87.311 Chronic venous hypertension (idiopathic) with ulcer of right lower extremity I48.20 Chronic atrial fibrillation, unspecified Z79.01 Long term (current) use of anticoagulants Facility Procedures : CPT4 Code: 90300923 Description: 99214 - WOUND CARE VISIT-LEV 4 EST PT Modifier: Quantity: 1 : CPT4 Code: [id number] Description: 11042 - DEB SUBQ TISSUE 20 SQ CM/< ICD-10 Diagnosis Description L97.812 Non-pressure chronic ulcer of other part of right lower leg with fat layer expos Modifier: ed Quantity: 1 Physician Procedures : CPT4 Code Description Modifier [id number] 25638 - WC PHYS LEVEL 4 - EST PT ICD-10 Diagnosis Description L97.812 Non-pressure chronic ulcer of other part of right lower leg with fat layer exposed I87.311 Chronic venous  hypertension (idiopathic) with ulcer  of right lower extremity I48.20 Chronic atrial fibrillation, unspecified Z79.01 Long term (current) use of anticoagulants Moncayo, [name] ([id number]) [id number] - WC PHYS SUBQ TISS 20 SQ CM ICD-10 Diagnosis Description  L97.812 Non-pressure chronic ulcer of other part of right lower leg with fat layer exposed Quantity: 1 [id number].pdf Page 9 of 9 1 Electronic Signature(s) Signed: 10/09/2022 12:50:57 PM By: Rosalio Loud MSN RN CNS WTA Signed: 10/09/2022 3:12:59 PM By: Kalman Shan DO Entered By: Rosalio Loud on 10/09/2022 12:50:57

## 2022-10-09 NOTE — Progress Notes (Addendum)
TERRENCE, PIZANA [place] ([id number]) [id number] Page 1 of 5 Visit Report for 10/09/2022 Abuse Risk Screen Details Patient Name: Date of Service: [name], [name] 10/09/2022 10:30 A M Medical Record Number: [id number] Patient Account Number: [id number] Date of Birth/Sex: Treating RN: 23-Mar-1932 (89 y.o. [name] Primary Care Donyale Berthold: [name] Other Clinician: Referring Tyreon Frigon: Treating Brittnie Lewey/Extender: [name], [name] in Treatment: 0 Abuse Risk Screen Items Answer Electronic Signature(s) Signed: 10/09/2022 5:03:01 PM By: Rosalio Loud MSN RN CNS WTA Previous Signature: 10/09/2022 11:01:33 AM Version By: Rosalio Loud MSN RN CNS WTA Entered By: Rosalio Loud on 10/09/2022 11:30:02 -------------------------------------------------------------------------------- Activities of Daily Living Details Patient Name: Date of Service: [name] [place] 10/09/2022 10:30 A M Medical Record Number: [id number] Patient Account Number: [id number] Date of Birth/Sex: Treating RN: 04-21-1932 (89 y.o. [name] Primary Care Bryley Chrisman: [name] Other Clinician: Referring Medrith Veillon: Treating Kiani Wurtzel/Extender: [name], [name] in Treatment: 0 Activities of Daily Living Items Answer Activities of Daily Living (Please select one for each item) Drive Automobile Not Able T Medications ake Completely Able Use T elephone Completely Able Care for Appearance Completely Able Use T oilet Completely Able Bath / Shower Completely Able Dress Self Completely Able Feed Self Completely Able Walk Completely Able Get In / Out Bed Completely Able Housework Need Assistance Prepare Meals Need Assistance Handle Money Completely Able Shop for Self Need Assistance PEGGI, YONO Emmons ([id number]) [id number] Page 2 of 5 Electronic Signature(s) Signed: 10/09/2022 5:03:01 PM By: Rosalio Loud MSN RN CNS  WTA Previous Signature: 10/09/2022 11:01:43 AM Version By: Rosalio Loud MSN RN CNS WTA Entered By: Rosalio Loud on 10/09/2022 11:30:18 -------------------------------------------------------------------------------- Education Screening Details Patient Name: Date of Service: [name], [name] [place] 10/09/2022 10:30 A M Medical Record Number: [id number] Patient Account Number: [id number] Date of Birth/Sex: Treating RN: 06-21-1932 (89 y.o. [name] Primary Care Rydge Texidor: [name] Other Clinician: Referring Yanette Tripoli: Treating Ireta Pullman/Extender: [name], [name] in Treatment: 0 Learning Preferences/Education Level/Primary Language Learning Preference: Explanation Highest Education Level: College or Above Preferred Language: English Cognitive Barrier Language Barrier: No Translator Needed: No Memory Deficit: No Emotional Barrier: No Cultural/Religious Beliefs Affecting Medical Care: No Physical Barrier Impaired Vision: No Impaired Hearing: No Decreased Hand dexterity: No Knowledge/Comprehension Knowledge Level: High Comprehension Level: High Ability to understand written instructions: High Ability to understand verbal instructions: High Motivation Anxiety Level: Calm Cooperation: Cooperative Education Importance: Acknowledges Need Interest in Health Problems: Asks Questions Perception: Coherent Willingness to Engage in Self-Management High Activities: Readiness to Engage in Self-Management High Activities: Electronic Signature(s) Signed: 10/09/2022 5:03:01 PM By: Rosalio Loud MSN RN CNS WTA Previous Signature: 10/09/2022 11:02:56 AM Version By: Rosalio Loud MSN RN CNS WTA Entered By: Rosalio Loud on 10/09/2022 11:30:22 Alpern, [name] ([id number]) [phone] [id number] Page 3 of 5 -------------------------------------------------------------------------------- Fall Risk Assessment Details Patient Name: Date of Service: [name], [place] 10/09/2022 10:30 A M Medical Record Number: [id number] Patient Account Number: [id number] Date of Birth/Sex: Treating RN: 10/02/1931 (89 y.o. [name] Primary Care Ammy Lienhard: [name] Other Clinician: Referring Lashaunda Schild: Treating Jaylenn Baiza/Extender: [name], [name] in Treatment: 0 Fall Risk Assessment Items Have you had 2 or more falls in the last 12 monthso 0 No Have you had any fall that resulted in injury in the last 12 monthso 0 Yes FALLS RISK SCREEN History of falling - immediate or within 3 months 25 Yes Secondary diagnosis (Do  you have 2 or more medical diagnoseso) 0 No Ambulatory aid None/bed rest/wheelchair/nurse 0 No Crutches/cane/walker 15 Yes Furniture 0 No Intravenous therapy Access/Saline/Heparin Lock 0 No Gait/Transferring Normal/ bed rest/ wheelchair 0 No Weak (short steps with or without shuffle, stooped but able to lift head while walking, may seek 0 No support from furniture) Impaired (short steps with shuffle, may have difficulty arising from chair, head down, impaired 0 No balance) Mental Status Oriented to own ability 0 Yes Electronic Signature(s) Signed: 10/09/2022 5:03:01 PM By: Rosalio Loud MSN RN CNS WTA Previous Signature: 10/09/2022 11:03:00 AM Version By: Rosalio Loud MSN RN CNS WTA Entered By: Rosalio Loud on 10/09/2022 11:30:27 -------------------------------------------------------------------------------- Foot Assessment Details Patient Name: Date of Service: [name], [name] [place] 10/09/2022 10:30 A M Medical Record Number: [id number] Patient Account Number: [id number] Date of Birth/Sex: Treating RN: June 01, 1932 (89 y.o. [name] Primary Care Kikue Gerhart: [name] Other Clinician: Referring Raef Sprigg: Treating Alonnie Bieker/Extender: [name], [name] in Treatment: 0 Foot Assessment Items Site Locations LEXY, MEININGER [place] ([id number]) [id number] Page 4 of 5 + =  Sensation present, - = Sensation absent, C = Callus, U = Ulcer R = Redness, W = Warmth, M = Maceration, PU = Pre-ulcerative lesion F = Fissure, S = Swelling, D = Dryness Assessment Right: Left: Other Deformity: No No Prior Foot Ulcer: No No Prior Amputation: No No Charcot Joint: No No Ambulatory Status: Ambulatory With Help Assistance Device: Cane Gait: Administrator, arts) Signed: 10/09/2022 11:03:10 AM By: Rosalio Loud MSN RN CNS WTA Entered By: Rosalio Loud on 10/09/2022 11:03:10 -------------------------------------------------------------------------------- Nutrition Risk Screening Details Patient Name: Date of Service: [name] [place] 10/09/2022 10:30 A M Medical Record Number: [id number] Patient Account Number: [id number] Date of Birth/Sex: Treating RN: 07/09/1932 (89 y.o. [name] Primary Care Sundee Garland: [name] Other Clinician: Referring Kendall Arnell: Treating Mary Secord/Extender: [name], [name] in Treatment: 0 Height (in): 68 Weight (lbs): 137 Body Mass Index (BMI): 20.8 Nutrition Risk Screening Items Score Screening NUTRITION RISK SCREEN: I have an illness or condition that made me change the kind and/or amount of food I eat 0 No I eat fewer than two meals per day 0 No I eat few fruits and vegetables, or milk products 0 No I have three or more drinks of beer, liquor or wine almost every day 0 No I have tooth or mouth problems that make it hard for me to eat 0 No Rosine, Solecki [name] ([id number]) [phone] [id number] Page 5 of 5 I don't always have enough money to buy the food I need 0 No I eat alone most of the time 0 No I take three or more different prescribed or over-the-counter drugs a day 0 No Without wanting to, I have lost or gained 10 pounds in the last six months 0 No I am not always physically able to shop, cook and/or feed myself 0 No Nutrition Protocols Good Risk Protocol 0 No interventions  needed Moderate Risk Protocol High Risk Proctocol Risk Level: Good Risk Score: 0 Electronic Signature(s) Signed: 10/09/2022 5:03:01 PM By: Rosalio Loud MSN RN CNS WTA Previous Signature: 10/09/2022 11:03:04 AM Version By: Rosalio Loud MSN RN CNS WTA Entered By: Rosalio Loud on 10/09/2022 11:30:39

## 2022-10-10 DIAGNOSIS — D2271 Melanocytic nevi of right lower limb, including hip: Secondary | ICD-10-CM | POA: Diagnosis not present

## 2022-10-10 DIAGNOSIS — L97819 Non-pressure chronic ulcer of other part of right lower leg with unspecified severity: Secondary | ICD-10-CM | POA: Diagnosis not present

## 2022-10-10 DIAGNOSIS — L821 Other seborrheic keratosis: Secondary | ICD-10-CM | POA: Diagnosis not present

## 2022-10-10 DIAGNOSIS — D225 Melanocytic nevi of trunk: Secondary | ICD-10-CM | POA: Diagnosis not present

## 2022-10-10 DIAGNOSIS — X32XXXA Exposure to sunlight, initial encounter: Secondary | ICD-10-CM | POA: Diagnosis not present

## 2022-10-10 DIAGNOSIS — D2262 Melanocytic nevi of left upper limb, including shoulder: Secondary | ICD-10-CM | POA: Diagnosis not present

## 2022-10-10 DIAGNOSIS — D2261 Melanocytic nevi of right upper limb, including shoulder: Secondary | ICD-10-CM | POA: Diagnosis not present

## 2022-10-10 DIAGNOSIS — I482 Chronic atrial fibrillation, unspecified: Secondary | ICD-10-CM | POA: Diagnosis not present

## 2022-10-10 DIAGNOSIS — L570 Actinic keratosis: Secondary | ICD-10-CM | POA: Diagnosis not present

## 2022-10-10 DIAGNOSIS — D2272 Melanocytic nevi of left lower limb, including hip: Secondary | ICD-10-CM | POA: Diagnosis not present

## 2022-10-10 DIAGNOSIS — D802 Selective deficiency of immunoglobulin A [IgA]: Secondary | ICD-10-CM | POA: Diagnosis not present

## 2022-10-10 LAB — POCT INR: INR: 2.3 (ref 2.0–3.0)

## 2022-10-11 ENCOUNTER — Ambulatory Visit (INDEPENDENT_AMBULATORY_CARE_PROVIDER_SITE_OTHER): Payer: PPO

## 2022-10-11 DIAGNOSIS — Z7901 Long term (current) use of anticoagulants: Secondary | ICD-10-CM | POA: Diagnosis not present

## 2022-10-11 NOTE — Progress Notes (Signed)
Received INR result of 2.3 from [facility] via email.  Continue 1 tablet daily except take 1/2 tablet on Sundays and Thursdays. Recheck in 4 weeks, on 11/07/22.   LVM for patient with the above instructions. Instructions also emailed to [place] and Verda Cumins at [facility].

## 2022-10-11 NOTE — Patient Instructions (Addendum)
Pre visit review using our clinic review tool, if applicable. No additional management support is needed unless otherwise documented below in the visit note.  Continue 1 tablet daily except take 1/2 tablet on Sundays and Thursdays. Recheck in 4 weeks, on 11/07/22.

## 2022-10-15 ENCOUNTER — Other Ambulatory Visit (INDEPENDENT_AMBULATORY_CARE_PROVIDER_SITE_OTHER): Payer: Self-pay | Admitting: Internal Medicine

## 2022-10-15 DIAGNOSIS — L97919 Non-pressure chronic ulcer of unspecified part of right lower leg with unspecified severity: Secondary | ICD-10-CM

## 2022-10-15 DIAGNOSIS — I83019 Varicose veins of right lower extremity with ulcer of unspecified site: Secondary | ICD-10-CM

## 2022-10-16 ENCOUNTER — Encounter (HOSPITAL_BASED_OUTPATIENT_CLINIC_OR_DEPARTMENT_OTHER): Payer: PPO | Admitting: Internal Medicine

## 2022-10-16 ENCOUNTER — Ambulatory Visit (INDEPENDENT_AMBULATORY_CARE_PROVIDER_SITE_OTHER): Payer: PPO

## 2022-10-16 DIAGNOSIS — L97929 Non-pressure chronic ulcer of unspecified part of left lower leg with unspecified severity: Secondary | ICD-10-CM | POA: Diagnosis not present

## 2022-10-16 DIAGNOSIS — I482 Chronic atrial fibrillation, unspecified: Secondary | ICD-10-CM

## 2022-10-16 DIAGNOSIS — I83229 Varicose veins of left lower extremity with both ulcer of unspecified site and inflammation: Secondary | ICD-10-CM

## 2022-10-16 DIAGNOSIS — Z7901 Long term (current) use of anticoagulants: Secondary | ICD-10-CM

## 2022-10-16 DIAGNOSIS — I83019 Varicose veins of right lower extremity with ulcer of unspecified site: Secondary | ICD-10-CM | POA: Diagnosis not present

## 2022-10-16 DIAGNOSIS — L97812 Non-pressure chronic ulcer of other part of right lower leg with fat layer exposed: Secondary | ICD-10-CM

## 2022-10-16 DIAGNOSIS — L97919 Non-pressure chronic ulcer of unspecified part of right lower leg with unspecified severity: Secondary | ICD-10-CM | POA: Diagnosis not present

## 2022-10-16 DIAGNOSIS — I83219 Varicose veins of right lower extremity with both ulcer of unspecified site and inflammation: Secondary | ICD-10-CM | POA: Diagnosis not present

## 2022-10-16 DIAGNOSIS — I87311 Chronic venous hypertension (idiopathic) with ulcer of right lower extremity: Secondary | ICD-10-CM | POA: Diagnosis not present

## 2022-10-16 DIAGNOSIS — I872 Venous insufficiency (chronic) (peripheral): Secondary | ICD-10-CM | POA: Diagnosis not present

## 2022-10-16 NOTE — Progress Notes (Addendum)
URSULA, DERMODY [place] ([id number]) [id number] Page 1 of 9 Visit Report for 10/16/2022 Arrival Information Details Patient Name: Date of Service: HO [name], [place] 10/16/2022 9:30 A M Medical Record Number: [id number] Patient Account Number: [id number] Date of Birth/Sex: Treating RN: 27-Jan-1932 (89 y.o. [name] Primary Care Regino Fournet: [name] Other Clinician: Referring Emalie Mcwethy: Treating Isom Kochan/Extender: [name], [name] in Treatment: 1 Visit Information History Since Last Visit Added or deleted any medications: No Patient Arrived: Kasandra Knudsen Any new allergies or adverse reactions: No Arrival Time: 09:25 Hospitalized since last visit: No Accompanied By: self Has Dressing in Place as Prescribed: Yes Transfer Assistance: None Pain Present Now: No Patient Identification Verified: Yes Secondary Verification Process Completed: Yes Patient Requires Transmission-Based Precautions: No Patient Has Alerts: Yes Patient Alerts: Patient on Blood Thinner Warfarin ABI R NonComp TBI .88 ABI L 1.14 TBI .89 Electronic Signature(s) Signed: 10/24/2022 4:03:10 PM By: Rosalio Loud MSN RN CNS WTA Previous Signature: 10/16/2022 10:21:31 AM Version By: Rosalio Loud MSN RN CNS WTA Entered By: Rosalio Loud on 10/24/2022 16:03:10 -------------------------------------------------------------------------------- Clinic Level of Care Assessment Details Patient Name: Date of Service: [name], [name] [place] 10/16/2022 9:30 A M Medical Record Number: [id number] Patient Account Number: [id number] Date of Birth/Sex: Treating RN: 02-Feb-1932 (89 y.o. [name] Primary Care Shawnette Augello: [name] Other Clinician: Referring Reilly Molchan: Treating Keatyn Luck/Extender: [name], [name] in Treatment: 1 Clinic Level of Care Assessment Items TOOL 1 Quantity Score '[]'$  - 0 Use when EandM and Procedure is performed on INITIAL visit ASSESSMENTS - Nursing Assessment /  Reassessment '[]'$  - 0 General Physical Exam (combine w/ comprehensive assessment (listed just below) when performed on new pt. evals) '[]'$  - 0 Comprehensive Assessment (HX, ROS, Risk Assessments, Wounds Hx, etc.) TYKEISHA, PEER ([id number]) [id number] Page 2 of 9 ASSESSMENTS - Wound and Skin Assessment / Reassessment '[]'$  - 0 Dermatologic / Skin Assessment (not related to wound area) ASSESSMENTS - Ostomy and/or Continence Assessment and Care '[]'$  - 0 Incontinence Assessment and Management '[]'$  - 0 Ostomy Care Assessment and Management (repouching, etc.) PROCESS - Coordination of Care '[]'$  - 0 Simple Patient / Family Education for ongoing care '[]'$  - 0 Complex (extensive) Patient / Family Education for ongoing care '[]'$  - 0 Staff obtains Programmer, systems, Records, T Results / Process Orders est '[]'$  - 0 Staff telephones HHA, Nursing Homes / Clarify orders / etc '[]'$  - 0 Routine Transfer to another Facility (non-emergent condition) '[]'$  - 0 Routine Hospital Admission (non-emergent condition) '[]'$  - 0 New Admissions / Biomedical engineer / Ordering NPWT Apligraf, etc. , '[]'$  - 0 Emergency Hospital Admission (emergent condition) PROCESS - Special Needs '[]'$  - 0 Pediatric / Minor Patient Management '[]'$  - 0 Isolation Patient Management '[]'$  - 0 Hearing / Language / Visual special needs '[]'$  - 0 Assessment of Community assistance (transportation, D/C planning, etc.) '[]'$  - 0 Additional assistance / Altered mentation '[]'$  - 0 Support Surface(s) Assessment (bed, cushion, seat, etc.) INTERVENTIONS - Miscellaneous '[]'$  - 0 External ear exam '[]'$  - 0 Patient Transfer (multiple staff / Civil Service fast streamer / Similar devices) '[]'$  - 0 Simple Staple / Suture removal (25 or less) '[]'$  - 0 Complex Staple / Suture removal (26 or more) '[]'$  - 0 Hypo/Hyperglycemic Management (do not check if billed separately) '[]'$  - 0 Ankle / Brachial Index (ABI) - do not check if billed separately Has the patient been seen at  the hospital within the last three years: Yes Total Score: 0 Level Of  Care: ____ Electronic Signature(s) Signed: 10/16/2022 4:51:27 PM By: Rosalio Loud MSN RN CNS WTA Entered By: Rosalio Loud on 10/16/2022 16:26:27 -------------------------------------------------------------------------------- Encounter Discharge Information Details Patient Name: Date of Service: [name], [name] 10/16/2022 9:30 A M Medical Record Number: [id number] Patient Account Number: [id number] Date of Birth/Sex: Treating RN: 1931-11-05 (89 y.o. [name] Primary Care Kiaan Overholser: [name] Other Clinician: Referring Gizella [place]: Treating Ricca Melgarejo/Extender: [name], [name] in Treatment: 1 Bergh, [name] ([id number]) [id number] Page 3 of 9 Encounter Discharge Information Items Post Procedure Vitals Discharge Condition: Stable Temperature (F): 97.5 Ambulatory Status: Cane Pulse (bpm): 88 Discharge Destination: Home Respiratory Rate (breaths/min): 16 Transportation: Private Auto Blood Pressure (mmHg): 122/79 Accompanied By: daughter in law Schedule Follow-up Appointment: Yes Clinical Summary of Care: Electronic Signature(s) Signed: 10/16/2022 4:39:29 PM By: Rosalio Loud MSN RN CNS WTA Previous Signature: 10/16/2022 10:30:06 AM Version By: Rosalio Loud MSN RN CNS WTA Entered By: Rosalio Loud on 10/16/2022 16:39:28 -------------------------------------------------------------------------------- Lower Extremity Assessment Details Patient Name: Date of Service: [name], [name] 10/16/2022 9:30 A M Medical Record Number: [id number] Patient Account Number: [id number] Date of Birth/Sex: Treating RN: Nov 14, 1931 (89 y.o. [name] Primary Care Amarri Michaelson: [name] Other Clinician: Referring Jatoria Kneeland: Treating Trumaine Wimer/Extender: [name], [name] in Treatment: 1 Edema Assessment Assessed: [Left: No] [Right: No] [Left: Edema] [Right:  :] Calf Left: Right: Point of Measurement: 34 cm From Medial Instep 33 cm Ankle Left: Right: Point of Measurement: 12 cm From Medial Instep 20 cm Vascular Assessment Pulses: Dorsalis Pedis Palpable: [Right:Yes] Electronic Signature(s) Signed: 10/16/2022 10:21:54 AM By: Rosalio Loud MSN RN CNS WTA Entered By: Rosalio Loud on 10/16/2022 10:21:54 Mallery, [name] ([id number]) [id number] Page 4 of 9 -------------------------------------------------------------------------------- Multi Wound Chart Details Patient Name: Date of Service: HO [name], [name] 10/16/2022 9:30 A M Medical Record Number: [id number] Patient Account Number: [id number] Date of Birth/Sex: Treating RN: 1931/10/05 (89 y.o. [name] Primary Care Mattie Nordell: [name] Other Clinician: Referring Zyrus Hetland: Treating Madie Cahn/Extender: [name], [name] in Treatment: 1 Vital Signs Height(in): 68 Pulse(bpm): 88 Weight(lbs): 137 Blood Pressure(mmHg): 122/79 Body Mass Index(BMI): 20.8 Temperature(F): 97.5 Respiratory Rate(breaths/min): 16 [2:Photos:] [N/A:N/A] Right, Anterior Lower Leg N/A N/A Wound Location: Skin T ear/Laceration N/A N/A Wounding Event: Skin T ear N/A N/A Primary Etiology: Cataracts, Arrhythmia, Osteoarthritis, N/A N/A Comorbid History: Neuropathy 08/30/2022 N/A N/A Date Acquired: 1 N/A N/A Weeks of Treatment: Open N/A N/A Wound Status: No N/A N/A Wound Recurrence: 7.5x4.5x0.1 N/A N/A Measurements L x W x D (cm) 26.507 N/A N/A A (cm) : rea 2.651 N/A N/A Volume (cm) : 18.20% N/A N/A % Reduction in Area: 18.20% N/A N/A % Reduction in Volume: Full Thickness Without Exposed N/A N/A Classification: Support Structures Medium N/A N/A Exudate Amount: Serosanguineous N/A N/A Exudate Type: red, brown N/A N/A Exudate Color: Small (1-33%) N/A N/A Granulation Amount: Red, Pink N/A N/A Granulation Quality: Large (67-100%) N/A  N/A Necrotic Amount: Fat Layer (Subcutaneous Tissue): Yes N/A N/A Exposed Structures: Fascia: No Tendon: No Muscle: No Joint: No Bone: No Small (1-33%) N/A N/A Epithelialization: CHEM CAUT GRANULATION TISS N/A N/A Procedures Performed: Treatment Notes Wound #2 (Lower Leg) Wound Laterality: Right, Anterior Cleanser Wound Cleanser Discharge Instruction: Wash your hands with soap and water. Remove old dressing, discard into plastic bag and place into trash. Cleanse the wound with Wound Cleanser prior to applying a clean dressing using gauze sponges, not tissues or cotton balls. Do not scrub  or use excessive force. Pat dry using gauze sponges, not tissue or cotton balls. [place], [place] ([id number]) [id number] Page 5 of 9 Peri-Wound Care AandD Ointment Discharge Instruction: Apply AandD Ointment as directed Topical Santyl Collagenase Ointment, 30 (gm), tube Discharge Instruction: apply nickel thick to wound bed only Primary Dressing Hydrofera Blue Ready Transfer Foam, 4x5 (in/in) Discharge Instruction: Apply Hydrofera Blue Ready to wound bed as directed Secondary Dressing ABD Pad 5x9 (in/in) Discharge Instruction: Cover with ABD pad Secured With [place] Soft Cloth Surgical T ape ape, 2x2 (in/yd) Kerlix Roll Sterile or Non-Sterile 6-ply 4.5x4 (yd/yd) Discharge Instruction: Apply Kerlix as directed Compression Wrap Compression Stockings Add-Ons Electronic Signature(s) Signed: 10/16/2022 10:22:14 AM By: Rosalio Loud MSN RN CNS WTA Entered By: Rosalio Loud on 10/16/2022 10:22:13 -------------------------------------------------------------------------------- [place] Details Patient Name: Date of Service: [name], [name] 10/16/2022 9:30 A M Medical Record Number: [id number] Patient Account Number: [id number] Date of Birth/Sex: Treating RN: [birth year] (89 y.o. [name] Primary Care Keiton Cosma: [name]  Other Clinician: Referring Caileen Veracruz: Treating Nellene Courtois/Extender: [name], [name] in Treatment: 1 Active Inactive Necrotic Tissue Nursing Diagnoses: Impaired tissue integrity related to necrotic/devitalized tissue Knowledge deficit related to management of necrotic/devitalized tissue Goals: Necrotic/devitalized tissue will be minimized in the wound bed Date Initiated: 10/09/2022 Target Resolution Date: 11/09/2022 Goal Status: Active Patient/caregiver will verbalize understanding of reason and process for debridement of necrotic tissue Date Initiated: 10/09/2022 Target Resolution Date: 11/09/2022 Goal Status: Active Interventions: DORSEY, CHARETTE ([id number]) [id number] Page 6 of 9 Assess patient pain level pre-, during and post procedure and prior to discharge Provide education on necrotic tissue and debridement process Treatment Activities: Apply topical anesthetic as ordered : 10/09/2022 Excisional debridement : 10/09/2022 Notes: Venous Leg Ulcer Nursing Diagnoses: Actual venous Insuffiency (use after diagnosis is confirmed) Knowledge deficit related to disease process and management Goals: Patient will maintain optimal edema control Date Initiated: 10/09/2022 Target Resolution Date: 11/09/2022 Goal Status: Active Patient/caregiver will verbalize understanding of disease process and disease management Date Initiated: 10/09/2022 Target Resolution Date: 11/09/2022 Goal Status: Active Verify adequate tissue perfusion prior to therapeutic compression application Date Initiated: 10/09/2022 Target Resolution Date: 11/09/2022 Goal Status: Active Interventions: Assess peripheral edema status every visit. Provide education on venous insufficiency Treatment Activities: T ordered outside of clinic : 10/09/2022 est Notes: Wound/Skin Impairment Nursing Diagnoses: Impaired tissue integrity Knowledge deficit related to ulceration/compromised  skin integrity Goals: Patient will have a decrease in wound volume by X% from date: (specify in notes) Date Initiated: 10/09/2022 Target Resolution Date: 11/09/2022 Goal Status: Active Patient/caregiver will verbalize understanding of skin care regimen Date Initiated: 10/09/2022 Target Resolution Date: 11/09/2022 Goal Status: Active Ulcer/skin breakdown will have a volume reduction of 30% by week 4 Date Initiated: 10/09/2022 Target Resolution Date: 11/09/2022 Goal Status: Active Ulcer/skin breakdown will have a volume reduction of 50% by week 8 Date Initiated: 10/09/2022 Target Resolution Date: 12/08/2022 Goal Status: Active Ulcer/skin breakdown will have a volume reduction of 80% by week 12 Date Initiated: 10/09/2022 Target Resolution Date: 01/08/2023 Goal Status: Active Ulcer/skin breakdown will heal within 14 weeks Date Initiated: 10/09/2022 Target Resolution Date: 01/22/2023 Goal Status: Active Interventions: Assess patient/caregiver ability to obtain necessary supplies Assess patient/caregiver ability to perform ulcer/skin care regimen upon admission and as needed Assess ulceration(s) every visit Provide education on ulcer and skin care Treatment Activities: Skin care regimen initiated : 10/09/2022 Notes: Electronic Signature(s) Signed: 10/16/2022 4:51:27 PM By: Rosalio Loud MSN RN CNS  WTA Entered By: Rosalio Loud on 10/16/2022 09:57:02 Hopson, [name] ([id number]) [id number] Page 7 of 9 -------------------------------------------------------------------------------- Pain Assessment Details Patient Name: Date of Service: HO [name], [name] 10/16/2022 9:30 A M Medical Record Number: [id number] Patient Account Number: [id number] Date of Birth/Sex: Treating RN: 1932-04-04 (89 y.o. [name] Primary Care Lyanna Blystone: [name] Other Clinician: Referring Louis Ivery: Treating Kirrah Mustin/Extender: [name], [name] in Treatment: 1 Active  Problems Location of Pain Severity and Description of Pain Patient Has Paino No Site Locations Pain Management and Medication Current Pain Management: Electronic Signature(s) Signed: 10/16/2022 10:21:40 AM By: Rosalio Loud MSN RN CNS WTA Entered By: Rosalio Loud on 10/16/2022 10:21:40 -------------------------------------------------------------------------------- Patient/Caregiver Education Details Patient Name: Date of Service: [name], [name] 1/31/2024andnbsp9:30 A M Medical Record Number: [id number] Patient Account Number: [id number] Date of Birth/Gender: Treating RN: June 07, 1932 (89 y.o. [name] Primary Care Physician: [name] Other Clinician: Referring Physician: Treating Physician/Extender: [name], [name] in Treatment: 1 [place], [name] ([id number]) [id number] Page 8 of 9 Education Assessment Education Provided To: Patient Education Topics Provided Wound/Skin Impairment: Handouts: Caring for Your Ulcer Methods: Explain/Verbal Responses: State content correctly Electronic Signature(s) Signed: 10/16/2022 4:51:27 PM By: Rosalio Loud MSN RN CNS WTA Entered By: Rosalio Loud on 10/16/2022 09:56:57 -------------------------------------------------------------------------------- Wound Assessment Details Patient Name: Date of Service: [name], [name] 10/16/2022 9:30 A M Medical Record Number: [id number] Patient Account Number: [id number] Date of Birth/Sex: Treating RN: 1932-05-10 (89 y.o. [name] Primary Care Adia Crammer: [name] Other Clinician: Referring Rye Dorado: Treating Phil Corti/Extender: [name], [name] in Treatment: 1 Wound Status Wound Number: 2 Primary Etiology: Skin Tear Wound Location: Right, Anterior Lower Leg Wound Status: Open Wounding Event: Skin Tear/Laceration Comorbid History: Cataracts, Arrhythmia, Osteoarthritis, Neuropathy Date Acquired: 08/30/2022 Weeks Of  Treatment: 1 Clustered Wound: No Photos Wound Measurements Length: (cm) 7.5 Width: (cm) 4.5 Depth: (cm) 0.1 Area: (cm) 26.507 Volume: (cm) 2.651 % Reduction in Area: 18.2% % Reduction in Volume: 18.2% Epithelialization: Small (1-33%) Wound Description Classification: Full Thickness Without Exposed Support Structures Exudate Amount: Medium Peter, [name] ([id number]) Exudate Type: Serosanguineous Exudate Color: red, brown Foul Odor After Cleansing: No Slough/Fibrino Yes [phone].pdf Page 9 of 9 Wound Bed Granulation Amount: Small (1-33%) Exposed Structure Granulation Quality: Red, Pink Fascia Exposed: No Necrotic Amount: Large (67-100%) Fat Layer (Subcutaneous Tissue) Exposed: Yes Necrotic Quality: Adherent Slough Tendon Exposed: No Muscle Exposed: No Joint Exposed: No Bone Exposed: No Electronic Signature(s) Signed: 10/16/2022 4:51:27 PM By: Rosalio Loud MSN RN CNS WTA Entered By: Rosalio Loud on 10/16/2022 09:55:14 -------------------------------------------------------------------------------- [place] Details Patient Name: Date of Service: [name], [name] 10/16/2022 9:30 A M Medical Record Number: [id number] Patient Account Number: [id number] Date of Birth/Sex: Treating RN: 1932-04-27 (89 y.o. [name] Primary Care Analysia Dungee: [name] Other Clinician: Referring Arrion Burruel: Treating Serita Degroote/Extender: [name], [name] in Treatment: 1 Vital Signs Time Taken: 09:31 Temperature (F): 97.5 Height (in): 68 Pulse (bpm): 88 Weight (lbs): 137 Respiratory Rate (breaths/min): 16 Body Mass Index (BMI): 20.8 Blood Pressure (mmHg): 122/79 Reference Range: 80 - 120 mg / dl Electronic Signature(s) Signed: 10/16/2022 10:21:35 AM By: Rosalio Loud MSN RN CNS WTA Entered By: Rosalio Loud on 10/16/2022 10:21:35

## 2022-10-17 ENCOUNTER — Other Ambulatory Visit: Payer: Self-pay | Admitting: Family Medicine

## 2022-10-17 NOTE — Progress Notes (Signed)
JODIANN, OGNIBENE Yampa ([id number]) [id number] Page 1 of 7 Visit Report for 10/16/2022 Chief Complaint Document Details Patient Name: Date of Service: HO [name], [name] 10/16/2022 9:30 A M Medical Record Number: [id number] Patient Account Number: [id number] Date of Birth/Sex: Treating RN: [birth year] (89 y.o. [name] Primary Care Provider: [name] Other Clinician: Referring Provider: Treating Provider/Extender: [name], [name] in Treatment: 1 Information Obtained from: Patient Chief Complaint 10/09/2022; right lower extremity wound Electronic Signature(s) Signed: 10/16/2022 11:14:54 AM By: Kalman Shan DO Entered By: Kalman Shan on 10/16/2022 10:04:36 -------------------------------------------------------------------------------- Debridement Details Patient Name: Date of Service: [name], [name] 10/16/2022 9:30 A M Medical Record Number: [id number] Patient Account Number: [id number] Date of Birth/Sex: Treating RN: 11-16-1931 (89 y.o. [name] Primary Care Provider: [name] Other Clinician: Referring Provider: Treating Provider/Extender: [name], [name] in Treatment: 1 Debridement Performed for Assessment: Wound #2 Right,Anterior Lower Leg Performed By: Physician Kalman Shan, MD Debridement Type: Chemical/Enzymatic/Mechanical Agent Used: Santyl Level of Consciousness (Pre-procedure): Awake and Alert Pre-procedure Verification/Time Out No Taken: Instrument: Other : Santyl Bleeding: None Response to Treatment: Procedure was tolerated well Level of Consciousness (Post- Awake and Alert procedure): Post Debridement Measurements of Total Wound Length: (cm) 7.5 Width: (cm) 4.5 Depth: (cm) 0.1 Volume: (cm) 2.651 Lady, [name] ([id number]) [id number] Page 2 of 7 Character of Wound/Ulcer Post Debridement: Stable Post Procedure Diagnosis Same as  Pre-procedure Electronic Signature(s) Signed: 10/16/2022 4:25:55 PM By: Rosalio Loud MSN RN CNS WTA Signed: 10/16/2022 4:26:21 PM By: Kalman Shan DO Entered By: Rosalio Loud on 10/16/2022 16:25:55 -------------------------------------------------------------------------------- HPI Details Patient Name: Date of Service: [name], [name] 10/16/2022 9:30 A M Medical Record Number: [id number] Patient Account Number: [id number] Date of Birth/Sex: Treating RN: 1932/07/15 (89 y.o. [name] Primary Care Provider: [name] Other Clinician: Referring Provider: Treating Provider/Extender: [name], [name] in Treatment: 1 History of Present Illness HPI Description: 05/08/2021 upon evaluation today patient appears for initial inspection here in the clinic concerning issues that she has been having actually with a wound over the anterior portion of her right leg. She tells me that this was due to an injury where she struck this on something causing the initial trauma and she has been having trouble getting it healed. [name] over at [facility] has been helping take care of this. With that being said because it was not healing quite as quickly and effectively is what she wanted to see she did want [place] to take a look and make any recommendations to try to help things along. I am definitely happy to do that for sure. Fortunately there does not appear to be any signs of active infection at this time which is great news. The patient also does not seem to have too deep of the wound which is also good news. Her biggest concern is she really does not want to come weekly to the clinic especially since she has care at the [facility] location as far as getting the dressing changed and keeping an eye on it. She does have a history of atrial fibrillation for which she is on warfarin and she does not like to do water aerobics twice a week although she is not able to right now due to the  fact that she has this wound and cannot get in the water. That is a significant issue for her at this point. Otherwise does have noncompressible pulses based on  what we are seeing today. She wears compression socks for her chronic venous insufficiency she has had previous ablation therapy. 05/29/2021 upon evaluation today patient appears to be doing well with regard to her wound. Overall this is actually showing signs of good improvement with significant overall decrease in size since last week even. Overall I feel like that she is doing awesome and making excellent progress. Over at [facility] they been taking good care of her. 06/14/2021 upon evaluation today patient appears to be doing excellent in regard to her wound. In fact she is completely healed based on what we are seeing currently. I do not see any signs of active infection at this time which is great news. 10/05/2022 Ms. Kiamesha Samet is an 89 year old female with a past medical history of chronic venous insufficiency and atrial fibrillation on warfarin that presents the clinic for a 1-10-monthhistory of nonhealing ulcer to the anterior aspect of the right lower extremity. She states she hit her leg against an object in the beginning of December. She describes what started out as a hematoma and was debrided by a physician in her facility. She has been using Xeroform to the wound bed. She currently denies signs of infection. She has been wearing her compression stocking. 1/31; patient presents for follow-up. She has been using Hydrofera Blue and Santyl to the wound bed daily. There is been improvement in wound healing. She is scheduled for ABIs with TBI's today. She has no issues or complaints today. She denies signs of infection. Electronic Signature(s) Signed: 10/16/2022 11:14:54 AM By: HKalman ShanDO Entered By: HKalman Shanon 10/16/2022 10:05:03 Wannamaker, MKarle Starch([id number] Page 3 of  7 -------------------------------------------------------------------------------- Physical Exam Details Patient Name: Date of Service: [place]arcie [place] 10/16/2022 9:30 A M Medical Record Number: [id number] Account Number: [id number] of Birth/Sex: Treating RN: [birth year](89y.o. FDrema PryPrimary Care Provider: BEliezer LoftsOther Clinician: Referring Provider: Treating Provider/Extender: HArmandina Stammer [name] in Treatment: 1 Constitutional . Psychiatric . Notes Right lower extremity: T the anterior aspect there is an open wound with granulation tissue and nonviable tissue throughout the surface. 2+ pitting edema to the o knee. No surrounding signs of infection including increased warmth, erythema or purulent drainage. Venous stasis dermatitis throughout the leg. Difficult to palpate pedal pulses Electronic Signature(s) Signed: 10/16/2022 11:14:54 AM By: HKalman ShanDO Entered By: HKalman Shanon 10/16/2022 10:06:33 -------------------------------------------------------------------------------- Physician Orders Details Patient Name: Date of Service: [name], [name] 10/16/2022 9:30 A M Medical Record Number: [id number] Account Number: [id number] of Birth/Sex: Treating RN: [birth year](89y.o. FDrema PryPrimary Care Provider: BEliezer LoftsOther Clinician: Referring Provider: Treating Provider/Extender: HArmandina Stammer [name] in Treatment: 1 Verbal / Phone Orders: No Diagnosis Coding Follow-up Appointments Return Appointment in 1 week. Anesthetic (Use 'Patient Medications' Section for Anesthetic Order Entry) Lidocaine applied to wound bed Wound Treatment Wound #2 - Lower Leg Wound Laterality: Right, Anterior Cleanser: Byram Ancillary Kit - 15 Day Supply (DME) (Generic) 1 x Per Day/30 Days Discharge Instructions: Use supplies as instructed; Kit contains: (15) Saline Bullets; (15) 3x3 Gauze; 15 pr  Gloves Cleanser: Wound Cleanser 1 x Per Day/30 Days Discharge Instructions: Wash your hands with soap and water. Remove old dressing, discard into plastic bag and place into trash. Cleanse the HANYE, BROSE([id number] Page 4 of 7 wound with Wound Cleanser prior to applying a clean dressing using gauze  sponges, not tissues or cotton balls. Do not scrub or use excessive force. Pat dry using gauze sponges, not tissue or cotton balls. Peri-Wound Care: AandD Ointment 1 x Per Day/30 Days Discharge Instructions: Apply AandD Ointment as directed Topical: Santyl Collagenase Ointment, 30 (gm), tube 1 x Per Day/30 Days Discharge Instructions: apply nickel thick to wound bed only Prim Dressing: Hydrofera Blue Ready Transfer Foam, 4x5 (in/in) (DME) (Dispense As Written) 1 x Per Day/30 Days ary Discharge Instructions: Apply Hydrofera Blue Ready to wound bed as directed Secondary Dressing: ABD Pad 5x9 (in/in) (DME) (Generic) 1 x Per Day/30 Days Discharge Instructions: Cover with ABD pad Secured With: Medipore T - 58M Medipore H Soft Cloth Surgical T ape ape, 2x2 (in/yd) (DME) (Generic) 1 x Per Day/30 Days Secured With: Kerlix Roll Sterile or Non-Sterile 6-ply 4.5x4 (yd/yd) (DME) (Generic) 1 x Per Day/30 Days Discharge Instructions: Apply Kerlix as directed Electronic Signature(s) Signed: 10/16/2022 4:26:17 PM By: Rosalio Loud MSN RN CNS WTA Signed: 10/16/2022 7:47:02 PM By: Kalman Shan DO Previous Signature: 10/16/2022 10:29:46 AM Version By: Rosalio Loud MSN RN CNS WTA Previous Signature: 10/16/2022 11:14:54 AM Version By: Kalman Shan DO Entered By: Rosalio Loud on 10/16/2022 16:26:17 -------------------------------------------------------------------------------- Problem List Details Patient Name: Date of Service: [name], [name] 10/16/2022 9:30 A M Medical Record Number: [id number] Patient Account Number: [id number] Date of Birth/Sex: Treating  RN: 04/06/1932 (89 y.o. [name] Primary Care Provider: [name] Other Clinician: Referring Provider: Treating Provider/Extender: [name], [name] in Treatment: 1 Active Problems ICD-10 Encounter Code Description Active Date MDM Diagnosis [phone] Non-pressure chronic ulcer of other part of right lower leg with fat layer 10/09/2022 No Yes exposed I87.311 Chronic venous hypertension (idiopathic) with ulcer of right lower extremity 10/09/2022 No Yes I48.20 Chronic atrial fibrillation, unspecified 10/09/2022 No Yes Z79.01 Long term (current) use of anticoagulants 10/09/2022 No Yes Inactive Problems ORI, KREITER ([id number]) [id number] Page 5 of 7 Resolved Problems Electronic Signature(s) Signed: 10/16/2022 11:14:54 AM By: Kalman Shan DO Entered By: Kalman Shan on 10/16/2022 10:04:33 -------------------------------------------------------------------------------- Progress Note Details Patient Name: Date of Service: [name], [name] 10/16/2022 9:30 A M Medical Record Number: [id number] Patient Account Number: [id number] Date of Birth/Sex: Treating RN: 02-06-1932 (89 y.o. [name] Primary Care Provider: [name] Other Clinician: Referring Provider: Treating Provider/Extender: [name], [name] in Treatment: 1 Subjective Chief Complaint Information obtained from Patient 10/09/2022; right lower extremity wound History of Present Illness (HPI) 05/08/2021 upon evaluation today patient appears for initial inspection here in the clinic concerning issues that she has been having actually with a wound over the anterior portion of her right leg. She tells me that this was due to an injury where she struck this on something causing the initial trauma and she has been having trouble getting it healed. [name] over at [facility] has been helping take care of this. With that being said because it was not  healing quite as quickly and effectively is what she wanted to see she did want [place] to take a look and make any recommendations to try to help things along. I am definitely happy to do that for sure. Fortunately there does not appear to be any signs of active infection at this time which is great news. The patient also does not seem to have too deep of the wound which is also good news. Her biggest concern is she really does not want to come weekly to the  clinic especially since she has care at the [facility] location as far as getting the dressing changed and keeping an eye on it. She does have a history of atrial fibrillation for which she is on warfarin and she does not like to do water aerobics twice a week although she is not able to right now due to the fact that she has this wound and cannot get in the water. That is a significant issue for her at this point. Otherwise does have noncompressible pulses based on what we are seeing today. She wears compression socks for her chronic venous insufficiency she has had previous ablation therapy. 05/29/2021 upon evaluation today patient appears to be doing well with regard to her wound. Overall this is actually showing signs of good improvement with significant overall decrease in size since last week even. Overall I feel like that she is doing awesome and making excellent progress. Over at [facility] they been taking good care of her. 06/14/2021 upon evaluation today patient appears to be doing excellent in regard to her wound. In fact she is completely healed based on what we are seeing currently. I do not see any signs of active infection at this time which is great news. 10/05/2022 Ms. Rut Betterton is an 89 year old female with a past medical history of chronic venous insufficiency and atrial fibrillation on warfarin that presents the clinic for a 1-18-monthhistory of nonhealing ulcer to the anterior aspect of the right lower extremity. She states she  hit her leg against an object in the beginning of December. She describes what started out as a hematoma and was debrided by a physician in her facility. She has been using Xeroform to the wound bed. She currently denies signs of infection. She has been wearing her compression stocking. 1/31; patient presents for follow-up. She has been using Hydrofera Blue and Santyl to the wound bed daily. There is been improvement in wound healing. She is scheduled for ABIs with TBI's today. She has no issues or complaints today. She denies signs of infection. Objective Constitutional Vitals Time Taken: 9:31 AM, Height: 68 in, Weight: 137 lbs, BMI: 20.8, Temperature: 97.5 F, Pulse: 88 bpm, Respiratory Rate: 16 breaths/min, Blood Pressure: 122/79 mmHg. General Notes: Right lower extremity: T the anterior aspect there is an open wound with granulation tissue and nonviable tissue throughout the surface. 2+ oALEICIA, KENAGYJDoyle([id number] Page 6 of 7 pitting edema to the knee. No surrounding signs of infection including increased warmth, erythema or purulent drainage. Venous stasis dermatitis throughout the leg. Difficult to palpate pedal pulses Integumentary (Hair, Skin) Wound #2 status is Open. Original cause of wound was Skin T ear/Laceration. The date acquired was: 08/30/2022. The wound has been in treatment 1 weeks. The wound is located on the Right,Anterior Lower Leg. The wound measures 7.5cm length x 4.5cm width x 0.1cm depth; 26.507cm^2 area and 2.651cm^3 volume. There is Fat Layer (Subcutaneous Tissue) exposed. There is a medium amount of serosanguineous drainage noted. There is small (1-33%) red, pink granulation within the wound bed. There is a large (67-100%) amount of necrotic tissue within the wound bed including Adherent Slough. Assessment Active Problems ICD-10 Non-pressure chronic ulcer of other part of right lower leg with fat layer exposed Chronic  venous hypertension (idiopathic) with ulcer of right lower extremity Chronic atrial fibrillation, unspecified Long term (current) use of anticoagulants Patient's wound has improved in size in appearance since last clinic visit. She also has islands of epithelization occurring  in the center. I recommended continuing the course with Santyl and Hydrofera Blue and her compression stocking daily. She is scheduled to have her ABIs with TBI's today. If patient has adequate blood flow for wound healing we will start doing an office wrap next week. Follow-up in 1 week. Procedures Wound #2 Pre-procedure diagnosis of Wound #2 is a Skin T located on the Right,Anterior Lower Leg . An CHEM CAUT GRANULATION TISS procedure was performed ear by Kalman Shan, MD. Post procedure Diagnosis Wound #2: Same as Pre-Procedure Plan Follow-up Appointments: Return Appointment in 1 week. Anesthetic (Use 'Patient Medications' Section for Anesthetic Order Entry): Lidocaine applied to wound bed WOUND #2: - Lower Leg Wound Laterality: Right, Anterior Cleanser: Wound Cleanser 1 x Per Day/30 Days Discharge Instructions: Wash your hands with soap and water. Remove old dressing, discard into plastic bag and place into trash. Cleanse the wound with Wound Cleanser prior to applying a clean dressing using gauze sponges, not tissues or cotton balls. Do not scrub or use excessive force. Pat dry using gauze sponges, not tissue or cotton balls. Peri-Wound Care: AandD Ointment 1 x Per Day/30 Days Discharge Instructions: Apply AandD Ointment as directed Topical: Santyl Collagenase Ointment, 30 (gm), tube 1 x Per Day/30 Days Discharge Instructions: apply nickel thick to wound bed only Prim Dressing: Hydrofera Blue Ready Transfer Foam, 4x5 (in/in) (DME) (Dispense As Written) 1 x Per Day/30 Days ary Discharge Instructions: Apply Hydrofera Blue Ready to wound bed as directed Secondary Dressing: ABD Pad 5x9 (in/in) (DME) (Generic) 1  x Per Day/30 Days Discharge Instructions: Cover with ABD pad Secured With: Medipore T - 39M Medipore H Soft Cloth Surgical T ape ape, 2x2 (in/yd) (DME) (Generic) 1 x Per Day/30 Days Secured With: Kerlix Roll Sterile or Non-Sterile 6-ply 4.5x4 (yd/yd) (DME) (Generic) 1 x Per Day/30 Days Discharge Instructions: Apply Kerlix as directed 1. Santyl and Hydrofera Blue 2. Compression stockings daily 3. Follow-up in 1 week Electronic Signature(s) Signed: 10/16/2022 11:14:54 AM By: Kalman Shan DO Entered By: Kalman Shan on 10/16/2022 10:08:59 Barbeau, [name] ([id number]) [id number] Page 7 of 7 -------------------------------------------------------------------------------- SuperBill Details Patient Name: Date of Service: HO [name], [place] 10/16/2022 Medical Record Number: [id number] Patient Account Number: [id number] Date of Birth/Sex: Treating RN: 1932-07-17 (89 y.o. [name] Primary Care Provider: [name] Other Clinician: Referring Provider: Treating Provider/Extender: [name], [name] in Treatment: 1 Diagnosis Coding ICD-10 Codes Code Description [phone] Non-pressure chronic ulcer of other part of right lower leg with fat layer exposed I87.311 Chronic venous hypertension (idiopathic) with ulcer of right lower extremity I48.20 Chronic atrial fibrillation, unspecified Z79.01 Long term (current) use of anticoagulants Facility Procedures : CPT4 Code: [id number] Description: [phone] - DEBRIDE W/O ANES NON SELECT ICD-10 Diagnosis Description L97.812 Non-pressure chronic ulcer of other part of right lower leg with fat layer expo Modifier: sed Quantity: 1 Physician Procedures : CPT4 Code Description Modifier [id number] - WC PHYS LEVEL 3 - EST PT ICD-10 Diagnosis Description L97.812 Non-pressure chronic ulcer of other part of right lower leg with fat layer exposed I87.311 Chronic venous hypertension (idiopathic) with ulcer  of  right lower extremity I48.20 Chronic atrial fibrillation, unspecified Z79.01 Long term (current) use of anticoagulants Quantity: 1 Electronic Signature(s) Signed: 10/16/2022 4:38:07 PM By: Rosalio Loud MSN RN CNS WTA Signed: 10/16/2022 7:47:02 PM By: Kalman Shan DO Previous Signature: 10/16/2022 11:14:54 AM Version By: Kalman Shan DO Entered By: Rosalio Loud on 10/16/2022 16:38:06

## 2022-10-18 NOTE — Telephone Encounter (Signed)
Last office visit 09/18/2022 for Leg Swelling and Venous Ulcer right leg.  Last refilled 03/21/22 for #270 with 1 refill.  Next Appt: 12/26/22.

## 2022-10-21 ENCOUNTER — Telehealth: Payer: Self-pay | Admitting: Family Medicine

## 2022-10-21 MED ORDER — IPRATROPIUM BROMIDE 0.03 % NA SOLN: 2.0000 | Freq: Three times a day (TID) | NASAL | 2 refills | Status: DC

## 2022-10-21 NOTE — Telephone Encounter (Signed)
Refill sent as requested. 

## 2022-10-21 NOTE — Telephone Encounter (Signed)
Prescription Request  10/21/2022  Is this a "Controlled Substance" medicine? No  LOV: 09/18/2022  What is the name of the medication or equipment? ipratropium (ATROVENT) 0.03 % nasal spray   Have you contacted your pharmacy to request a refill? Yes   Which pharmacy would you like this sent to?  [id number]Phone: [phone]: [phone]   Patient notified that their request is being sent to the clinical staff for review and that they should receive a response within 2 business days.   Please advise at Mobile [phone](mobile)

## 2022-10-23 ENCOUNTER — Encounter: Payer: PPO | Attending: Internal Medicine | Admitting: Internal Medicine

## 2022-10-23 DIAGNOSIS — I482 Chronic atrial fibrillation, unspecified: Secondary | ICD-10-CM | POA: Diagnosis not present

## 2022-10-23 DIAGNOSIS — Z7901 Long term (current) use of anticoagulants: Secondary | ICD-10-CM | POA: Diagnosis not present

## 2022-10-23 DIAGNOSIS — W2203XA Walked into furniture, initial encounter: Secondary | ICD-10-CM | POA: Insufficient documentation

## 2022-10-23 DIAGNOSIS — L97812 Non-pressure chronic ulcer of other part of right lower leg with fat layer exposed: Secondary | ICD-10-CM | POA: Insufficient documentation

## 2022-10-23 DIAGNOSIS — I87311 Chronic venous hypertension (idiopathic) with ulcer of right lower extremity: Secondary | ICD-10-CM | POA: Diagnosis not present

## 2022-10-23 NOTE — Progress Notes (Signed)
[name] [place] ([id number]) [id number] Page 1 of 7 Visit Report for 10/23/2022 Chief Complaint Document Details Patient Name: Date of Service: [name], [name] 10/23/2022 12:30 PM Medical Record Number: [id number] Patient Account Number: [id number] Date of Birth/Sex: Treating RN: September 03, 1932 (89 y.o. [name] Primary Care Provider: [name] Other Clinician: Referring Provider: Treating Provider/Extender: [name], [name] in Treatment: 2 Information Obtained from: Patient Chief Complaint 10/09/2022; right lower extremity wound Electronic Signature(s) Signed: 10/23/2022 2:40:27 PM By: Kalman Shan DO Entered By: Kalman Shan on 10/23/2022 13:24:47 -------------------------------------------------------------------------------- Debridement Details Patient Name: Date of Service: [name], [name] 10/23/2022 12:30 PM Medical Record Number: [id number] Patient Account Number: [id number] Date of Birth/Sex: Treating RN: 1932-03-29 (89 y.o. [name] Primary Care Provider: [name] Other Clinician: Referring Provider: Treating Provider/Extender: [name], [name] in Treatment: 2 Debridement Performed for Assessment: Wound #2 Right,Anterior Lower Leg Performed By: Physician Kalman Shan, MD Debridement Type: Debridement Level of Consciousness (Pre-procedure): Awake and Alert Pre-procedure Verification/Time Out Yes - 01:06 Taken: Start Time: 01:06 Pain Control: Lidocaine 4% T opical Solution T Area Debrided (L x W): otal 6.5 (cm) x 4.5 (cm) = 29.25 (cm) Tissue and other material debrided: Viable, Non-Viable, Slough, Subcutaneous, Slough Level: Skin/Subcutaneous Tissue Debridement Description: Excisional Instrument: Curette Bleeding: Minimum Hemostasis Achieved: Pressure Response to Treatment: Procedure was tolerated well Level of Consciousness (Post- Awake and Alert procedure): [name], [name]  ([id number]) [id number] Page 2 of 7 Post Debridement Measurements of Total Wound Length: (cm) 6.5 Width: (cm) 4.5 Depth: (cm) 0.3 Volume: (cm) 6.892 Character of Wound/Ulcer Post Debridement: Stable Post Procedure Diagnosis Same as Pre-procedure Electronic Signature(s) Signed: 10/23/2022 2:40:27 PM By: Kalman Shan DO Signed: 10/23/2022 4:20:44 PM By: Rosalio Loud MSN RN CNS WTA Entered By: Rosalio Loud on 10/23/2022 13:07:16 -------------------------------------------------------------------------------- HPI Details Patient Name: Date of Service: [name], [name] 10/23/2022 12:30 PM Medical Record Number: [id number] Patient Account Number: [id number] Date of Birth/Sex: Treating RN: 1932-01-12 (89 y.o. [name] Primary Care Provider: [name] Other Clinician: Referring Provider: Treating Provider/Extender: [name], [name] in Treatment: 2 History of Present Illness HPI Description: 05/08/2021 upon evaluation today patient appears for initial inspection here in the clinic concerning issues that she has been having actually with a wound over the anterior portion of her right leg. She tells me that this was due to an injury where she struck this on something causing the initial trauma and she has been having trouble getting it healed. [name] over at [facility] has been helping take care of this. With that being said because it was not healing quite as quickly and effectively is what she wanted to see she did want [place] to take a look and make any recommendations to try to help things along. I am definitely happy to do that for sure. Fortunately there does not appear to be any signs of active infection at this time which is great news. The patient also does not seem to have too deep of the wound which is also good news. Her biggest concern is she really does not want to come weekly to the clinic especially since she has care at the [facility] location as far as getting the dressing changed and keeping an eye on it. She does have a history of atrial fibrillation for which she is on warfarin and she does not like to do water aerobics twice a week although she  is not able to right now due to the fact that she has this wound and cannot get in the water. That is a significant issue for her at this point. Otherwise does have noncompressible pulses based on what we are seeing today. She wears compression socks for her chronic venous insufficiency she has had previous ablation therapy. 05/29/2021 upon evaluation today patient appears to be doing well with regard to her wound. Overall this is actually showing signs of good improvement with significant overall decrease in size since last week even. Overall I feel like that she is doing awesome and making excellent progress. Over at [facility] they been taking good care of her. 06/14/2021 upon evaluation today patient appears to be doing excellent in regard to her wound. In fact she is completely healed based on what we are seeing currently. I do not see any signs of active infection at this time which is great news. 10/05/2022 [name] is an 89 year old female with a past medical history of chronic venous insufficiency and atrial fibrillation on warfarin that presents the clinic for a 1-37-monthhistory of nonhealing ulcer to the anterior aspect of the right lower extremity. She states she hit her leg against an object in the beginning of December. She describes what started out as a hematoma and was debrided by a physician in her facility. She has been using Xeroform to the wound bed. She currently denies signs of infection. She has been wearing her compression stocking. 1/31; patient presents for follow-up. She has been using Hydrofera Blue and Santyl to the wound bed daily. There is been improvement in wound healing. She is scheduled for ABIs with TBI's today. She has no issues or  complaints today. She denies signs of infection. 2/7; patient presents for follow-up. She has been using Hydrofera Blue and Santyl to the wound bed. She had her ABIs with TBI's however the results have not been released. She has no issues or complaints today. Electronic Signature(s) Signed: 10/23/2022 2:40:27 PM By: HKalman ShanDO Entered By: HKalman Shanon 10/23/2022 13:25:33 Caponigro, MKarle [name]([id number] Page 3 of 7 -------------------------------------------------------------------------------- Physical Exam Details Patient Name: Date of Service: [name] 10/23/2022 12:30 PM Medical Record Number: [id number] Account Number: [id number] of Birth/Sex: Treating RN: [birth year](89y.o. FDrema PryPrimary Care Provider: BEliezer LoftsOther Clinician: Referring Provider: Treating Provider/Extender: HArmandina Stammer [name] in Treatment: 2 Constitutional . Cardiovascular . Psychiatric . Notes Right lower extremity: T the anterior aspect there is an open wound with granulation tissue and nonviable tissue throughout the surface. 2+ pitting edema to the o knee. No surrounding signs of infection including increased warmth, erythema or purulent drainage. Venous stasis dermatitis throughout the leg. Difficult to palpate pedal pulses Electronic Signature(s) Signed: 10/23/2022 2:40:27 PM By: HKalman ShanDO Entered By: HKalman Shanon 10/23/2022 13:26:03 -------------------------------------------------------------------------------- Physician Orders Details Patient Name: Date of Service: [name], [name] 10/23/2022 12:30 PM Medical Record Number: [id number] Account Number: [id number] of Birth/Sex: Treating RN: [birth year](89y.o. FDrema PryPrimary Care Provider: BEliezer LoftsOther Clinician: Referring Provider: Treating Provider/Extender: HArmandina Stammer [name] in  Treatment: 2 Verbal / Phone Orders: No Diagnosis Coding Follow-up Appointments Return Appointment in 1 week. Anesthetic (Use 'Patient Medications' Section for Anesthetic Order Entry) Lidocaine applied to wound bed Wound Treatment Wound #2 - Lower Leg Wound Laterality: Right, Anterior Cleanser: Byram Ancillary Kit - 15 Day Supply (  Generic) 1 x Per Day/30 Days Discharge Instructions: Use supplies as instructed; Kit contains: (15) Saline Bullets; (15) 3x3 Gauze; 15 pr Gloves [name], [name] ([id number]) [id number] Page 4 of 7 Cleanser: Wound Cleanser 1 x Per Day/30 Days Discharge Instructions: Wash your hands with soap and water. Remove old dressing, discard into plastic bag and place into trash. Cleanse the wound with Wound Cleanser prior to applying a clean dressing using gauze sponges, not tissues or cotton balls. Do not scrub or use excessive force. Pat dry using gauze sponges, not tissue or cotton balls. Topical: Santyl Collagenase Ointment, 30 (gm), tube 1 x Per Day/30 Days Discharge Instructions: apply nickel thick to wound bed only Prim Dressing: Hydrofera Blue Ready Transfer Foam, 4x5 (in/in) (Dispense As Written) 1 x Per Day/30 Days ary Discharge Instructions: Apply Hydrofera Blue Ready to wound bed as directed Secondary Dressing: ABD Pad 5x9 (in/in) (Generic) 1 x Per Day/30 Days Discharge Instructions: Cover with ABD pad Secured With: Medipore T - 70M Medipore H Soft Cloth Surgical T ape ape, 2x2 (in/yd) (Generic) 1 x Per Day/30 Days Secured With: Kerlix Roll Sterile or Non-Sterile 6-ply 4.5x4 (yd/yd) (Generic) 1 x Per Day/30 Days Discharge Instructions: Apply Kerlix as directed Electronic Signature(s) Signed: 10/23/2022 2:40:27 PM By: Kalman Shan DO Entered By: Kalman Shan on 10/23/2022 13:31:13 -------------------------------------------------------------------------------- Problem List Details Patient Name: Date of Service: [name], [name] 10/23/2022 12:30 PM Medical Record Number: [id number] Patient Account Number: [id number] Date of Birth/Sex: Treating RN: 1931-10-29 (89 y.o. [name] Primary Care Provider: [name] Other Clinician: Referring Provider: Treating Provider/Extender: [name], [name] in Treatment: 2 Active Problems ICD-10 Encounter Code Description Active Date MDM Diagnosis [phone] Non-pressure chronic ulcer of other part of right lower leg with fat layer 10/09/2022 No Yes exposed I87.311 Chronic venous hypertension (idiopathic) with ulcer of right lower extremity 10/09/2022 No Yes I48.20 Chronic atrial fibrillation, unspecified 10/09/2022 No Yes Z79.01 Long term (current) use of anticoagulants 10/09/2022 No Yes Inactive Problems Resolved Problems [name], [name] ([id number]) [id number] Page 5 of 7 Electronic Signature(s) Signed: 10/23/2022 2:40:27 PM By: Kalman Shan DO Entered By: Kalman Shan on 10/23/2022 13:24:43 -------------------------------------------------------------------------------- Progress Note Details Patient Name: Date of Service: [name], [name] 10/23/2022 12:30 PM Medical Record Number: [id number] Patient Account Number: [id number] Date of Birth/Sex: Treating RN: 1931/11/05 (89 y.o. [name] Primary Care Provider: [name] Other Clinician: Referring Provider: Treating Provider/Extender: [name], [name] in Treatment: 2 Subjective Chief Complaint Information obtained from Patient 10/09/2022; right lower extremity wound History of Present Illness (HPI) 05/08/2021 upon evaluation today patient appears for initial inspection here in the clinic concerning issues that she has been having actually with a wound over the anterior portion of her right leg. She tells me that this was due to an injury where she struck this on something causing the initial trauma and she has been having trouble  getting it healed. [name] over at [facility] has been helping take care of this. With that being said because it was not healing quite as quickly and effectively is what she wanted to see she did want [place] to take a look and make any recommendations to try to help things along. I am definitely happy to do that for sure. Fortunately there does not appear to be any signs of active infection at this time which is great news. The patient also does not seem to have too deep of the wound which  is also good news. Her biggest concern is she really does not want to come weekly to the clinic especially since she has care at the [facility] location as far as getting the dressing changed and keeping an eye on it. She does have a history of atrial fibrillation for which she is on warfarin and she does not like to do water aerobics twice a week although she is not able to right now due to the fact that she has this wound and cannot get in the water. That is a significant issue for her at this point. Otherwise does have noncompressible pulses based on what we are seeing today. She wears compression socks for her chronic venous insufficiency she has had previous ablation therapy. 05/29/2021 upon evaluation today patient appears to be doing well with regard to her wound. Overall this is actually showing signs of good improvement with significant overall decrease in size since last week even. Overall I feel like that she is doing awesome and making excellent progress. Over at [facility] they been taking good care of her. 06/14/2021 upon evaluation today patient appears to be doing excellent in regard to her wound. In fact she is completely healed based on what we are seeing currently. I do not see any signs of active infection at this time which is great news. 10/05/2022 [name] is an 89 year old female with a past medical history of chronic venous insufficiency and atrial fibrillation on warfarin that presents  the clinic for a 1-38-monthhistory of nonhealing ulcer to the anterior aspect of the right lower extremity. She states she hit her leg against an object in the beginning of December. She describes what started out as a hematoma and was debrided by a physician in her facility. She has been using Xeroform to the wound bed. She currently denies signs of infection. She has been wearing her compression stocking. 1/31; patient presents for follow-up. She has been using Hydrofera Blue and Santyl to the wound bed daily. There is been improvement in wound healing. She is scheduled for ABIs with TBI's today. She has no issues or complaints today. She denies signs of infection. 2/7; patient presents for follow-up. She has been using Hydrofera Blue and Santyl to the wound bed. She had her ABIs with TBI's however the results have not been released. She has no issues or complaints today. Objective Constitutional Vitals Time Taken: 12:44 PM, Height: 68 in, Weight: 137 lbs, BMI: 20.8, Temperature: 97.5 F, Pulse: 106 bpm, Respiratory Rate: 16 breaths/min, Blood Pressure: 110/69 mmHg. General Notes: Right lower extremity: T the anterior aspect there is an open wound with granulation tissue and nonviable tissue throughout the surface. 2+ o pitting edema to the knee. No surrounding signs of infection including increased warmth, erythema or purulent drainage. Venous stasis dermatitis throughout [name], [place]([id number] Page 6 of 7 the leg. Difficult to palpate pedal pulses Integumentary (Hair, Skin) Wound #2 status is Open. Original cause of wound was Skin T ear/Laceration. The date acquired was: 08/30/2022. The wound has been in treatment 2 weeks. The wound is located on the Right,Anterior Lower Leg. The wound measures 6.5cm length x 4.5cm width x 0.2cm depth; 22.973cm^2 area and 4.595cm^3 volume. There is Fat Layer (Subcutaneous Tissue) exposed. There is a medium amount  of serosanguineous drainage noted. There is small (1-33%) red, pink granulation within the wound bed. There is a large (67-100%) amount of necrotic tissue within the wound bed including Adherent Slough. Assessment Active  Problems ICD-10 Non-pressure chronic ulcer of other part of right lower leg with fat layer exposed Chronic venous hypertension (idiopathic) with ulcer of right lower extremity Chronic atrial fibrillation, unspecified Long term (current) use of anticoagulants Patient's wound has shown improvement in size and appearance since last clinic visit. I debrided nonviable tissue. Results of her ABIs and TBI's have not been released. We have tried contacting the office for results however there has been no reply. For now I recommended continue the course with Santyl and Hydrofera Blue under compression stocking. Procedures Wound #2 Pre-procedure diagnosis of Wound #2 is a Skin T located on the Right,Anterior Lower Leg . There was a Excisional Skin/Subcutaneous Tissue Debridement ear with a total area of 29.25 sq cm performed by Kalman Shan, MD. With the following instrument(s): Curette to remove Viable and Non-Viable tissue/material. Material removed includes Subcutaneous Tissue and Slough and after achieving pain control using Lidocaine 4% T opical Solution. No specimens were taken. A time out was conducted at 01:06, prior to the start of the procedure. A Minimum amount of bleeding was controlled with Pressure. The procedure was tolerated well. Post Debridement Measurements: 6.5cm length x 4.5cm width x 0.3cm depth; 6.892cm^3 volume. Character of Wound/Ulcer Post Debridement is stable. Post procedure Diagnosis Wound #2: Same as Pre-Procedure Plan Follow-up Appointments: Return Appointment in 1 week. Anesthetic (Use 'Patient Medications' Section for Anesthetic Order Entry): Lidocaine applied to wound bed WOUND #2: - Lower Leg Wound Laterality: Right, Anterior Cleanser: Byram  Ancillary Kit - 15 Day Supply (Generic) 1 x Per Day/30 Days Discharge Instructions: Use supplies as instructed; Kit contains: (15) Saline Bullets; (15) 3x3 Gauze; 15 pr Gloves Cleanser: Wound Cleanser 1 x Per Day/30 Days Discharge Instructions: Wash your hands with soap and water. Remove old dressing, discard into plastic bag and place into trash. Cleanse the wound with Wound Cleanser prior to applying a clean dressing using gauze sponges, not tissues or cotton balls. Do not scrub or use excessive force. Pat dry using gauze sponges, not tissue or cotton balls. Topical: Santyl Collagenase Ointment, 30 (gm), tube 1 x Per Day/30 Days Discharge Instructions: apply nickel thick to wound bed only Prim Dressing: Hydrofera Blue Ready Transfer Foam, 4x5 (in/in) (Dispense As Written) 1 x Per Day/30 Days ary Discharge Instructions: Apply Hydrofera Blue Ready to wound bed as directed Secondary Dressing: ABD Pad 5x9 (in/in) (Generic) 1 x Per Day/30 Days Discharge Instructions: Cover with ABD pad Secured With: Medipore T - 54M Medipore H Soft Cloth Surgical T ape ape, 2x2 (in/yd) (Generic) 1 x Per Day/30 Days Secured With: Kerlix Roll Sterile or Non-Sterile 6-ply 4.5x4 (yd/yd) (Generic) 1 x Per Day/30 Days Discharge Instructions: Apply Kerlix as directed 1. In office sharp debridement 2. Hydrofera Blue and Santyl 3. Compression stockings daily 4. Follow-up in 1 week Electronic Signature(s) Signed: 10/23/2022 2:40:27 PM By: Kalman Shan DO Entered By: Kalman Shan on 10/23/2022 13:28:17 [name], [name] ([id number]) [id number] Page 7 of 7 -------------------------------------------------------------------------------- SuperBill Details Patient Name: Date of Service: [name], [place] 10/23/2022 Medical Record Number: [id number] Patient Account Number: [id number] Date of Birth/Sex: Treating RN: Apr 29, 1932 (89 y.o. [name] Primary Care Provider: [name] Other  Clinician: Referring Provider: Treating Provider/Extender: [name], [name] in Treatment: 2 Diagnosis Coding ICD-10 Codes Code Description [phone] Non-pressure chronic ulcer of other part of right lower leg with fat layer exposed I87.311 Chronic venous hypertension (idiopathic) with ulcer of right lower extremity I48.20 Chronic atrial fibrillation, unspecified Z79.01 Long term (  current) use of anticoagulants Facility Procedures : CPT4 Code: [id number] Description: 86578 - DEB SUBQ TISSUE 20 SQ CM/< ICD-10 Diagnosis Description L97.812 Non-pressure chronic ulcer of other part of right lower leg with fat layer exp I87.311 Chronic venous hypertension (idiopathic) with ulcer of right lower extremity Modifier: osed Quantity: 1 : CPT4 Code: [id number] Description: 84132 - DEB SUBQ TISS EA ADDL 20CM ICD-10 Diagnosis Description G40.102 Non-pressure chronic ulcer of other part of right lower leg with fat layer exp I87.311 Chronic venous hypertension (idiopathic) with ulcer of right lower extremity Modifier: osed Quantity: 1 Physician Procedures : CPT4 Code Description Modifier [id number] 11042 - WC PHYS SUBQ TISS 20 SQ CM ICD-10 Diagnosis Description Q03.474 Non-pressure chronic ulcer of other part of right lower leg with fat layer exposed I87.311 Chronic venous hypertension (idiopathic) with  ulcer of right lower extremity Quantity: 1 : [id number] 75643 - WC PHYS SUBQ TISS EA ADDL 20 CM ICD-10 Diagnosis Description P29.518 Non-pressure chronic ulcer of other part of right lower leg with fat layer exposed I87.311 Chronic venous hypertension (idiopathic) with ulcer of right lower extremity Quantity: 1 Electronic Signature(s) Signed: 10/23/2022 2:40:27 PM By: Kalman Shan DO Entered By: Kalman Shan on 10/23/2022 13:31:06

## 2022-10-23 NOTE — Progress Notes (Signed)
[name], [name] [place] ([id number]) [id number] Page 1 of 9 Visit Report for 10/23/2022 Arrival Information Details Patient Name: Date of Service: [name], [place] 10/23/2022 12:30 PM Medical Record Number: [id number] Patient Account Number: [id number] Date of Birth/Sex: Treating RN: 1932-04-25 (89 y.o. [name] Primary Care Cicero Noy: [name] Other Clinician: Referring Ridge Lafond: Treating Nicolo Tomko/Extender: [name], [name] in Treatment: 2 Visit Information History Since Last Visit Added or deleted any medications: No Patient Arrived: [name] Any new allergies or adverse reactions: No Arrival Time: 12:42 Had a fall or experienced change in No Accompanied By: self activities of daily living that may affect Transfer Assistance: None risk of falls: Patient Identification Verified: Yes Hospitalized since last visit: No Secondary Verification Process Completed: Yes Has Dressing in Place as Prescribed: Yes Patient Requires Transmission-Based Precautions: No Pain Present Now: No Patient Has Alerts: Yes Patient Alerts: Patient on Blood Thinner Warfarin Electronic Signature(s) Signed: 10/23/2022 4:20:44 PM By: Rosalio Loud MSN RN CNS WTA Entered By: Rosalio Loud on 10/23/2022 12:44:02 -------------------------------------------------------------------------------- Clinic Level of Care Assessment Details Patient Name: Date of Service: [name], [name] 10/23/2022 12:30 PM Medical Record Number: [id number] Patient Account Number: [id number] Date of Birth/Sex: Treating RN: 1932/07/02 (89 y.o. [name] Primary Care Supreme Rybarczyk: [name] Other Clinician: Referring Chelle Cayton: Treating Edla Para/Extender: [name], [name] in Treatment: 2 Clinic Level of Care Assessment Items TOOL 1 Quantity Score '[]'$  - 0 Use when EandM and Procedure is performed on INITIAL visit ASSESSMENTS - Nursing Assessment / Reassessment '[]'$  -  0 General Physical Exam (combine w/ comprehensive assessment (listed just below) when performed on new pt. evals) '[]'$  - 0 Comprehensive Assessment (HX, ROS, Risk Assessments, Wounds Hx, etc.) ASSESSMENTS - Wound and Skin Assessment / Reassessment '[]'$  - 0 Dermatologic / Skin Assessment (not related to wound area) [name], [name] ([id number]) [id number] Page 2 of 9 ASSESSMENTS - Ostomy and/or Continence Assessment and Care '[]'$  - 0 Incontinence Assessment and Management '[]'$  - 0 Ostomy Care Assessment and Management (repouching, etc.) PROCESS - Coordination of Care '[]'$  - 0 Simple Patient / Family Education for ongoing care '[]'$  - 0 Complex (extensive) Patient / Family Education for ongoing care '[]'$  - 0 Staff obtains Programmer, systems, Records, T Results / Process Orders est '[]'$  - 0 Staff telephones HHA, Nursing Homes / Clarify orders / etc '[]'$  - 0 Routine Transfer to another Facility (non-emergent condition) '[]'$  - 0 Routine Hospital Admission (non-emergent condition) '[]'$  - 0 New Admissions / Biomedical engineer / Ordering NPWT Apligraf, etc. , '[]'$  - 0 Emergency Hospital Admission (emergent condition) PROCESS - Special Needs '[]'$  - 0 Pediatric / Minor Patient Management '[]'$  - 0 Isolation Patient Management '[]'$  - 0 Hearing / Language / Visual special needs '[]'$  - 0 Assessment of Community assistance (transportation, D/C planning, etc.) '[]'$  - 0 Additional assistance / Altered mentation '[]'$  - 0 Support Surface(s) Assessment (bed, cushion, seat, etc.) INTERVENTIONS - Miscellaneous '[]'$  - 0 External ear exam '[]'$  - 0 Patient Transfer (multiple staff / Civil Service fast streamer / Similar devices) '[]'$  - 0 Simple Staple / Suture removal (25 or less) '[]'$  - 0 Complex Staple / Suture removal (26 or more) '[]'$  - 0 Hypo/Hyperglycemic Management (do not check if billed separately) '[]'$  - 0 Ankle / Brachial Index (ABI) - do not check if billed separately Has the patient been seen at the hospital within  the last three years: Yes Total Score: 0 Level Of Care: ____ Electronic Signature(s) Signed: 10/23/2022 4:20:44  PM By: Rosalio Loud MSN RN CNS WTA Entered By: Rosalio Loud on 10/23/2022 13:08:38 -------------------------------------------------------------------------------- Encounter Discharge Information Details Patient Name: Date of Service: [name], [place] [name] [place] 10/23/2022 12:30 PM Medical Record Number: [id number] Patient Account Number: [id number] Date of Birth/Sex: Treating RN: Jun 14, 1932 (89 y.o. [name] Primary Care Amika Tassin: [name] Other Clinician: Referring Samay Delcarlo: Treating Elgar Scoggins/Extender: [name], [name] in Treatment: 2 Encounter Discharge Information Items Post Procedure [name] ([id number]) [id number] Page 3 of 9 Discharge Condition: Stable Temperature (F): 97.5 Ambulatory Status: Cane Pulse (bpm): 106 Discharge Destination: Home Respiratory Rate (breaths/min): 16 Transportation: Private Auto Blood Pressure (mmHg): 110/69 Accompanied By: self Schedule Follow-up Appointment: Yes Clinical Summary of Care: Electronic Signature(s) Signed: 10/23/2022 4:20:44 PM By: Rosalio Loud MSN RN CNS WTA Entered By: Rosalio Loud on 10/23/2022 13:11:33 -------------------------------------------------------------------------------- Lower Extremity Assessment Details Patient Name: Date of Service: [name], [name] 10/23/2022 12:30 PM Medical Record Number: [id number] Patient Account Number: [id number] Date of Birth/Sex: Treating RN: 1932-06-24 (89 y.o. [name] Primary Care Kamaiya Antilla: [name] Other Clinician: Referring Tarl Cephas: Treating Isayah Ignasiak/Extender: [name], [name] in Treatment: 2 Edema Assessment Assessed: [Left: No] [Right: No] [Left: Edema] [Right: :] Calf Left: Right: Point of Measurement: 34 cm From Medial Instep 33 cm Ankle Left: Right: Point of Measurement: 12  cm From Medial Instep 20 cm Vascular Assessment Pulses: Dorsalis Pedis Palpable: [Right:Yes] Electronic Signature(s) Signed: 10/23/2022 4:20:44 PM By: Rosalio Loud MSN RN CNS WTA Entered By: Rosalio Loud on 10/23/2022 12:55:02 Bibbins, [name] ([id number]) [id number] Page 4 of 9 -------------------------------------------------------------------------------- Multi Wound Chart Details Patient Name: Date of Service: [name], [name] 10/23/2022 12:30 PM Medical Record Number: [id number] Patient Account Number: [id number] Date of Birth/Sex: Treating RN: March 13, 1932 (89 y.o. [name] Primary Care Analya Louissaint: [name] Other Clinician: Referring Gerline Ratto: Treating Kallan Merrick/Extender: [name], [name] in Treatment: 2 Vital Signs Height(in): 68 Pulse(bpm): 106 Weight(lbs): 137 Blood Pressure(mmHg): 110/69 Body Mass Index(BMI): 20.8 Temperature(F): 97.5 Respiratory Rate(breaths/min): 16 [2:Photos:] [N/A:N/A] Right, Anterior Lower Leg N/A N/A Wound Location: Skin T ear/Laceration N/A N/A Wounding Event: Skin T ear N/A N/A Primary Etiology: Cataracts, Arrhythmia, Osteoarthritis, N/A N/A Comorbid History: Neuropathy 08/30/2022 N/A N/A Date Acquired: 2 N/A N/A Weeks of Treatment: Open N/A N/A Wound Status: No N/A N/A Wound Recurrence: 6.5x4.5x0.2 N/A N/A Measurements L x W x D (cm) 22.973 N/A N/A A (cm) : rea 4.595 N/A N/A Volume (cm) : 29.10% N/A N/A % Reduction in Area: -41.80% N/A N/A % Reduction in Volume: Full Thickness Without Exposed N/A N/A Classification: Support Structures Medium N/A N/A Exudate Amount: Serosanguineous N/A N/A Exudate Type: red, brown N/A N/A Exudate Color: Small (1-33%) N/A N/A Granulation Amount: Red, Pink N/A N/A Granulation Quality: Large (67-100%) N/A N/A Necrotic Amount: Fat Layer (Subcutaneous Tissue): Yes N/A N/A Exposed Structures: Fascia: No Tendon: No Muscle:  No Joint: No Bone: No Small (1-33%) N/A N/A Epithelialization: Treatment Notes Electronic Signature(s) Signed: 10/23/2022 4:20:44 PM By: Rosalio Loud MSN RN CNS WTA Entered By: Rosalio Loud on 10/23/2022 12:56:26 Soderberg, [name] ([id number]) [id number] Page 5 of 9 -------------------------------------------------------------------------------- Multi-Disciplinary Care Plan Details Patient Name: Date of Service: [name], [name] 10/23/2022 12:30 PM Medical Record Number: [id number] Patient Account Number: [id number] Date of Birth/Sex: Treating RN: 1932/03/20 (89 y.o. [name] Primary Care Quantavious Eggert: [name] Other Clinician: Referring Nelsy Madonna: Treating Maraki Macquarrie/Extender: [name], [name] in Treatment: 2 Active  Inactive Necrotic Tissue Nursing Diagnoses: Impaired tissue integrity related to necrotic/devitalized tissue Knowledge deficit related to management of necrotic/devitalized tissue Goals: Necrotic/devitalized tissue will be minimized in the wound bed Date Initiated: 10/09/2022 Target Resolution Date: 11/09/2022 Goal Status: Active Patient/caregiver will verbalize understanding of reason and process for debridement of necrotic tissue Date Initiated: 10/09/2022 Target Resolution Date: 11/09/2022 Goal Status: Active Interventions: Assess patient pain level pre-, during and post procedure and prior to discharge Provide education on necrotic tissue and debridement process Treatment Activities: Apply topical anesthetic as ordered : 10/09/2022 Excisional debridement : 10/09/2022 Notes: Venous Leg Ulcer Nursing Diagnoses: Actual venous Insuffiency (use after diagnosis is confirmed) Knowledge deficit related to disease process and management Goals: Patient will maintain optimal edema control Date Initiated: 10/09/2022 Target Resolution Date: 11/09/2022 Goal Status: Active Patient/caregiver will verbalize understanding of disease  process and disease management Date Initiated: 10/09/2022 Target Resolution Date: 11/09/2022 Goal Status: Active Verify adequate tissue perfusion prior to therapeutic compression application Date Initiated: 10/09/2022 Target Resolution Date: 11/09/2022 Goal Status: Active Interventions: Assess peripheral edema status every visit. Provide education on venous insufficiency Treatment Activities: T ordered outside of clinic : 10/09/2022 est Notes: Wound/Skin Impairment Nursing Diagnoses: Impaired tissue integrity Knowledge deficit related to ulceration/compromised skin integrity Goals: Patient will have a decrease in wound volume by X% from date: (specify in notes) Date Initiated: 10/09/2022 Target Resolution Date: 11/09/2022 Goal Status: Active Patient/caregiver will verbalize understanding of skin care regimen Date Initiated: 10/09/2022 Target Resolution Date: 11/09/2022 Goal Status: Active Ulcer/skin breakdown will have a volume reduction of 30% by week 4 [name], [name] ([id number]) [id number] Page 6 of 9 Date Initiated: 10/09/2022 Target Resolution Date: 11/09/2022 Goal Status: Active Ulcer/skin breakdown will have a volume reduction of 50% by week 8 Date Initiated: 10/09/2022 Target Resolution Date: 12/08/2022 Goal Status: Active Ulcer/skin breakdown will have a volume reduction of 80% by week 12 Date Initiated: 10/09/2022 Target Resolution Date: 01/08/2023 Goal Status: Active Ulcer/skin breakdown will heal within 14 weeks Date Initiated: 10/09/2022 Target Resolution Date: 01/22/2023 Goal Status: Active Interventions: Assess patient/caregiver ability to obtain necessary supplies Assess patient/caregiver ability to perform ulcer/skin care regimen upon admission and as needed Assess ulceration(s) every visit Provide education on ulcer and skin care Treatment Activities: Skin care regimen initiated : 10/09/2022 Notes: Electronic Signature(s) Signed:  10/23/2022 4:20:44 PM By: Rosalio Loud MSN RN CNS WTA Entered By: Rosalio Loud on 10/23/2022 13:09:41 -------------------------------------------------------------------------------- Pain Assessment Details Patient Name: Date of Service: [name], [name] 10/23/2022 12:30 PM Medical Record Number: [id number] Patient Account Number: [id number] Date of Birth/Sex: Treating RN: 1932/03/01 (89 y.o. [name] Primary Care Lionardo Haze: [name] Other Clinician: Referring Bert Ptacek: Treating Elley Harp/Extender: [name], [name] in Treatment: 2 Active Problems Location of Pain Severity and Description of Pain Patient Has Paino No Site Locations Pain Management and Medication Current Pain Management: GIRL, SCHISSLER ([id number]) [id number] Page 7 of 9 Electronic Signature(s) Signed: 10/23/2022 4:20:44 PM By: Rosalio Loud MSN RN CNS WTA Entered By: Rosalio Loud on 10/23/2022 12:47:30 -------------------------------------------------------------------------------- Patient/Caregiver Education Details Patient Name: Date of Service: [name], [name] 2/7/2024andnbsp12:30 PM Medical Record Number: [id number] Patient Account Number: [id number] Date of Birth/Gender: Treating RN: 1931/11/04 (89 y.o. [name] Primary Care Physician: [name] Other Clinician: Referring Physician: Treating Physician/Extender: Weyman Croon Weeks in Treatment: 2 Education Assessment Education Provided To: Patient Education Topics Provided Wound Debridement: Handouts: Wound Debridement Methods: Explain/Verbal Responses: State content correctly Electronic Signature(s) Signed: 10/23/2022 4:20:44  PM By: Rosalio Loud MSN RN CNS WTA Entered By: Rosalio Loud on 10/23/2022 13:09:35 -------------------------------------------------------------------------------- Wound Assessment Details Patient Name: Date of Service: [name], [name] 10/23/2022 12:30  PM Medical Record Number: [id number] Patient Account Number: [id number] Date of Birth/Sex: Treating RN: [birth year] (89 y.o. [name] Primary Care Somara Frymire: [name] Other Clinician: Referring Aleysia Oltmann: Treating Marston Mccadden/Extender: [name], [name] in Treatment: 2 Wound Status Wound Number: 2 Primary Etiology: Skin Tear Wound Location: Right, Anterior Lower Leg Wound Status: Open Wounding Event: Skin Tear/Laceration Comorbid History: Cataracts, Arrhythmia, Osteoarthritis, Neuropathy Date Acquired: 08/30/2022 RAEJEAN, SWINFORD Wilmer ([id number]) [id number] Page 8 of 9 Weeks Of Treatment: 2 Clustered Wound: No Photos Wound Measurements Length: (cm) 6.5 Width: (cm) 4.5 Depth: (cm) 0.2 Area: (cm) 22.973 Volume: (cm) 4.595 % Reduction in Area: 29.1% % Reduction in Volume: -41.8% Epithelialization: Small (1-33%) Wound Description Classification: Full Thickness Without Exposed Support Structures Exudate Amount: Medium Exudate Type: Serosanguineous Exudate Color: red, brown Foul Odor After Cleansing: No Slough/Fibrino Yes Wound Bed Granulation Amount: Small (1-33%) Exposed Structure Granulation Quality: Red, Pink Fascia Exposed: No Necrotic Amount: Large (67-100%) Fat Layer (Subcutaneous Tissue) Exposed: Yes Necrotic Quality: Adherent Slough Tendon Exposed: No Muscle Exposed: No Joint Exposed: No Bone Exposed: No Treatment Notes Wound #2 (Lower Leg) Wound Laterality: Right, Anterior Cleanser [place] Ancillary Kit - 15 Day Supply Discharge Instruction: Use supplies as instructed; Kit contains: (15) Saline Bullets; (15) 3x3 Gauze; 15 pr Gloves Wound Cleanser Discharge Instruction: Wash your hands with soap and water. Remove old dressing, discard into plastic bag and place into trash. Cleanse the wound with Wound Cleanser prior to applying a clean dressing using gauze sponges, not tissues or cotton balls. Do not scrub or use  excessive force. Pat dry using gauze sponges, not tissue or cotton balls. Peri-Wound Care Topical Santyl Collagenase Ointment, 30 (gm), tube Discharge Instruction: apply nickel thick to wound bed only Primary Dressing Hydrofera Blue Ready Transfer Foam, 4x5 (in/in) Discharge Instruction: Apply Hydrofera Blue Ready to wound bed as directed Secondary Dressing ABD Pad 5x9 (in/in) Discharge Instruction: Cover with ABD pad Secured With [place] H Soft Cloth Surgical T ape ape, 2x2 (in/yd) Kerlix Roll Sterile or Non-Sterile 6-ply 4.5x4 (yd/yd) Discharge Instruction: Apply Kerlix as directed Compression Wrap Compression Stockings COURTNEE, MYER [place] ([id number]) [id number] Page 9 of 9 Add-Ons Electronic Signature(s) Signed: 10/23/2022 4:20:44 PM By: Rosalio Loud MSN RN CNS WTA Entered By: Rosalio Loud on 10/23/2022 12:53:55 -------------------------------------------------------------------------------- Vitals Details Patient Name: Date of Service: [name], [name] 10/23/2022 12:30 PM Medical Record Number: [id number] Patient Account Number: [id number] Date of Birth/Sex: Treating RN: 11-21-1931 (89 y.o. [name] Primary Care Faustine Tates: [name] Other Clinician: Referring Jeanene Mena: Treating Jakia Kennebrew/Extender: [name], [name] in Treatment: 2 Vital Signs Time Taken: 12:44 Temperature (F): 97.5 Height (in): 68 Pulse (bpm): 106 Weight (lbs): 137 Respiratory Rate (breaths/min): 16 Body Mass Index (BMI): 20.8 Blood Pressure (mmHg): 110/69 Reference Range: 80 - 120 mg / dl Electronic Signature(s) Signed: 10/23/2022 4:20:44 PM By: Rosalio Loud MSN RN CNS WTA Entered By: Rosalio Loud on 10/23/2022 12:47:21

## 2022-10-24 LAB — VAS US ABI WITH/WO TBI: Left ABI: 1.14

## 2022-10-30 ENCOUNTER — Encounter (HOSPITAL_BASED_OUTPATIENT_CLINIC_OR_DEPARTMENT_OTHER): Payer: PPO | Admitting: Internal Medicine

## 2022-10-30 DIAGNOSIS — I87311 Chronic venous hypertension (idiopathic) with ulcer of right lower extremity: Secondary | ICD-10-CM

## 2022-10-30 DIAGNOSIS — L97812 Non-pressure chronic ulcer of other part of right lower leg with fat layer exposed: Secondary | ICD-10-CM | POA: Diagnosis not present

## 2022-10-31 NOTE — Progress Notes (Addendum)
[name] ([id number]) [id number] Page 1 of 7 Visit Report for 10/30/2022 Chief Complaint Document Details Patient Name: Date of Service: Mackenzie [name], [place] [name] 10/30/2022 12:30 PM Medical Record Number: [id number] Patient Account Number: [id number] Date of Birth/Sex: Treating RN: [name] (89 y.o. [name] Primary Care Provider: Eliezer [name] Other Clinician: Referring Provider: Treating Provider/Extender: [name], [name] in Treatment: 3 Information Obtained from: Patient Chief Complaint 10/09/2022; right lower extremity wound Electronic Signature(s) Signed: 10/31/2022 12:44:29 PM By: Mackenzie Loud MSN RN CNS WTA Signed: 10/31/2022 1:35:56 PM By: Mackenzie Shan DO Previous Signature: 10/30/2022 3:27:54 PM Version By: Mackenzie Shan DO Entered By: [name] on 10/31/2022 12:44:29 -------------------------------------------------------------------------------- Debridement Details Patient Name: Date of Service: [name] 10/30/2022 12:30 PM Medical Record Number: [id number] Patient Account Number: [id number] Date of Birth/Sex: Treating RN: [name] (89 y.o. [name] Primary Care Provider: Eliezer [name] Other Clinician: Referring Provider: Treating Provider/Extender: [name], [name] in Treatment: 3 Debridement Performed for Assessment: Wound #2 Right,Anterior Lower Leg Performed By: Physician Mackenzie Shan, MD Debridement Type: Debridement Level of Consciousness (Pre-procedure): Awake and Alert Pre-procedure Verification/Time Out Yes - 13:03 Taken: Start Time: 13:03 Pain Control: Lidocaine 4% T opical Solution T Area Debrided (L x W): otal 6.5 (cm) x 5 (cm) = 32.5 (cm) Tissue and other material debrided: Viable, Non-Viable, Slough, Subcutaneous, Slough Level: Skin/Subcutaneous Tissue Debridement Description: Excisional Instrument: Curette Bleeding: Minimum Hemostasis Achieved:  Pressure Response to Treatment: Procedure was tolerated well Level of Consciousness (Post- Awake and Alert [name], [name] ([id number]) [id number] Page 2 of 7 Awake and Alert procedure): Post Debridement Measurements of Total Wound Length: (cm) 6.5 Width: (cm) 5 Depth: (cm) 0.3 Volume: (cm) 7.658 Character of Wound/Ulcer Post Debridement: Requires Further Debridement Post Procedure Diagnosis Same as Pre-procedure Electronic Signature(s) Signed: 10/30/2022 3:27:54 PM By: Mackenzie Shan DO Signed: 10/30/2022 4:19:55 PM By: Mackenzie Loud MSN RN CNS WTA Entered By: [name] on 10/30/2022 13:26:19 -------------------------------------------------------------------------------- HPI Details Patient Name: Date of Service: [name] 10/30/2022 12:30 PM Medical Record Number: [id number] Patient Account Number: [id number] Date of Birth/Sex: Treating RN: [name] (89 y.o. [name] Primary Care Provider: Eliezer [name] Other Clinician: Referring Provider: Treating Provider/Extender: [name], [name] in Treatment: 3 History of Present Illness HPI Description: 05/08/2021 upon evaluation today patient appears for initial inspection here in the clinic concerning issues that she has been having actually with a wound over the anterior portion of her right leg. She tells me that this was due to an injury where she struck this on something causing the initial trauma and she has been having trouble getting it healed. [name] over at [facility] has been helping take care of this. With that being said because it was not healing quite as quickly and effectively is what she wanted to see she did want [place] to take a look and make any recommendations to try to help things along. I am definitely happy to do that for sure. Fortunately there does not appear to be any signs of active infection at this time which is great news. The patient also does not  seem to have too deep of the wound which is also good news. Her biggest concern is she really does not want to come weekly to the clinic especially since she has care at the [facility] location as far as getting the dressing changed and keeping an eye on it. She does  have a history of atrial fibrillation for which she is on warfarin and she does not like to do water aerobics twice a week although she is not able to right now due to the fact that she has this wound and cannot get in the water. That is a significant issue for her at this point. Otherwise does have noncompressible pulses based on what we are seeing today. She wears compression socks for her chronic venous insufficiency she has had previous ablation therapy. 05/29/2021 upon evaluation today patient appears to be doing well with regard to her wound. Overall this is actually showing signs of good improvement with significant overall decrease in size since last week even. Overall I feel like that she is doing awesome and making excellent progress. Over at [facility] they been taking good care of her. 06/14/2021 upon evaluation today patient appears to be doing excellent in regard to her wound. In fact she is completely healed based on what we are seeing currently. I do not see any signs of active infection at this time which is great news. 10/05/2022 [name] is an 89 year old female with a past medical history of chronic venous insufficiency and atrial fibrillation on warfarin that presents the clinic for a 1-7-monthhistory of nonhealing ulcer to the anterior aspect of the right lower extremity. She states she hit her leg against an object in the beginning of December. She describes what started out as a hematoma and was debrided by a physician in her facility. She has been using Xeroform to the wound bed. She currently denies signs of infection. She has been wearing her compression stocking. 1/31; patient presents for follow-up.  She has been using Hydrofera Blue and Santyl to the wound bed daily. There is been improvement in wound healing. She is scheduled for ABIs with TBI's today. She has no issues or complaints today. She denies signs of infection. 2/7; patient presents for follow-up. She has been using Hydrofera Blue and Santyl to the wound bed. She had her ABIs with TBI's however the results have not been released. She has no issues or complaints today. 2/14; patient presents for follow-up. She has been using Hydrofera Blue and Santyl to the wound bed. Her ABI resulted and showed noncompressible on the right with a TBI of 0.88 with triphasic waveforms. Electronic Signature(s) Signed: 10/30/2022 3:27:54 PM By: HRexford Maus([id number] PM By: HKalman ShanDO [id number] Page 3 of 7 Signed: 10/30/2022 3:27:54 Entered By: HKalman Shanon 10/30/2022 13:23:41 -------------------------------------------------------------------------------- Physical Exam Details Patient Name: Date of Service: Mackenzie FOlean ReeNE 10/30/2022 12:30 PM Medical Record Number: [id number] Account Number: [id number] of Birth/Sex: Treating RN: [birth year][name](89y.o. FDrema PryPrimary Care Provider: BEliezer LoftsOther Clinician: Referring Provider: Treating Provider/Extender: HArmandina [name] [name] in Treatment: 3 Constitutional . Cardiovascular . Psychiatric . Notes Right lower extremity: T the anterior aspect there is an open wound with granulation tissue and nonviable tissue. 2+ pitting edema to the knee. No surrounding o signs of infection including increased warmth, erythema or purulent drainage. Venous stasis dermatitis throughout the leg. Electronic Signature(s) Signed: 10/30/2022 3:27:54 PM By: HKalman ShanDO Entered By: HKalman Shanon 10/30/2022  13:24:43 -------------------------------------------------------------------------------- Physician Orders Details Patient Name: Date of Service: [name] 10/30/2022 12:30 PM Medical Record Number: [id number] Account Number: [id number] of Birth/Sex: Treating RN: [birth year](89y.o. FDrema PryPrimary Care Provider: BEliezer LoftsOther  Clinician: Referring Provider: Treating Provider/Extender: [name], [name] in Treatment: 3 Verbal / Phone Orders: No Diagnosis Coding ICD-10 Coding Code Description [phone] Non-pressure chronic ulcer of other part of right lower leg with fat layer exposed I87.311 Chronic venous hypertension (idiopathic) with ulcer of right lower extremity I48.20 Chronic atrial fibrillation, unspecified Z79.01 Long term (current) use of anticoagulants [name], [name] ([id number]) [id number] Page 4 of 7 Follow-up Appointments Return Appointment in 1 week. Bathing/ Shower/ Hygiene May shower with wound dressing protected with water repellent cover or cast protector. No tub bath. Anesthetic (Use 'Patient Medications' Section for Anesthetic Order Entry) Lidocaine applied to wound bed Edema Control - Lymphedema / Segmental Compressive Device / Other Wound #2 Right,Anterior Lower Leg - Right Lower Extremity 3 Layer Compression System for Lymphedema. Elevate, Exercise Daily and A void Standing for Long Periods of Time. Elevate legs to the level of the heart and pump ankles as often as possible Wound Treatment Wound #2 - Lower Leg Wound Laterality: Right, Anterior Cleanser: Soap and Water 3 x Per Week/30 Days Discharge Instructions: Gently cleanse wound with antibacterial soap, rinse and pat dry prior to dressing wounds Topical: Santyl Collagenase Ointment, 30 (gm), tube 3 x Per Week/30 Days Discharge Instructions: apply nickel thick to wound bed only Prim Dressing: Hydrofera Blue Ready Transfer Foam,  2.5x2.5 (in/in) 3 x Per Week/30 Days ary Discharge Instructions: Apply Hydrofera Blue Ready to wound bed as directed Secondary Dressing: ABD Pad 5x9 (in/in) 3 x Per Week/30 Days Discharge Instructions: Cover with ABD pad Compression Wrap: 3-LAYER WRAP - Profore Lite LF 3 Multilayer Compression Bandaging System (DME) (Generic) 3 x Per Week/30 Days Discharge Instructions: Apply 3 multi-layer wrap as prescribed. Electronic Signature(s) Signed: 10/31/2022 1:35:56 PM By: Mackenzie Shan DO Signed: 11/01/2022 1:32:16 PM By: Mackenzie Loud MSN RN CNS WTA Previous Signature: 10/30/2022 3:27:54 PM Version By: Mackenzie Shan DO Previous Signature: 10/30/2022 4:19:55 PM Version By: Mackenzie Loud MSN RN CNS WTA Entered By: [name] on 10/31/2022 12:43:36 -------------------------------------------------------------------------------- Problem List Details Patient Name: Date of Service: [name] 10/30/2022 12:30 PM Medical Record Number: [id number] Patient Account Number: [id number] Date of Birth/Sex: Treating RN: [name] (89 y.o. [name] Primary Care Provider: Eliezer [name] Other Clinician: Referring Provider: Treating Provider/Extender: [name], [name] in Treatment: 3 Active Problems ICD-10 Encounter Code Description Active Date MDM Diagnosis [phone] Non-pressure chronic ulcer of other part of right lower leg with fat layer 10/09/2022 No Yes exposed MCKINZIE, PRICER ([id number]) [id number] Page 5 of 7 I87.311 Chronic venous hypertension (idiopathic) with ulcer of right lower extremity 10/09/2022 No Yes I48.20 Chronic atrial fibrillation, unspecified 10/09/2022 No Yes Z79.01 Long term (current) use of anticoagulants 10/09/2022 No Yes Inactive Problems Resolved Problems Electronic Signature(s) Signed: 10/31/2022 12:44:18 PM By: Mackenzie Loud MSN RN CNS WTA Signed: 10/31/2022 1:35:56 PM By: Mackenzie Shan DO Previous Signature:  10/30/2022 3:27:54 PM Version By: Mackenzie Shan DO Previous Signature: 10/30/2022 4:19:55 PM Version By: Mackenzie Loud MSN RN CNS WTA Entered By: [name] on 10/31/2022 12:44:18 -------------------------------------------------------------------------------- Progress Note Details Patient Name: Date of Service: [name] 10/30/2022 12:30 PM Medical Record Number: [id number] Patient Account Number: [id number] Date of Birth/Sex: Treating RN: Mackenzie [birth year], [name] (89 y.o. [name] Primary Care Provider: Eliezer [name] Other Clinician: Referring Provider: Treating Provider/Extender: [name], [name] in Treatment: 3 Subjective Chief Complaint Information obtained from Patient 10/09/2022; right lower extremity wound History of Present Illness (HPI) 05/08/2021 upon evaluation  today patient appears for initial inspection here in the clinic concerning issues that she has been having actually with a wound over the anterior portion of her right leg. She tells me that this was due to an injury where she struck this on something causing the initial trauma and she has been having trouble getting it healed. [name] over at [facility] has been helping take care of this. With that being said because it was not healing quite as quickly and effectively is what she wanted to see she did want [place] to take a look and make any recommendations to try to help things along. I am definitely happy to do that for sure. Fortunately there does not appear to be any signs of active infection at this time which is great news. The patient also does not seem to have too deep of the wound which is also good news. Her biggest concern is she really does not want to come weekly to the clinic especially since she has care at the [facility] location as far as getting the dressing changed and keeping an eye on it. She does have a history of atrial fibrillation for which she is on warfarin and she does not like to  do water aerobics twice a week although she is not able to right now due to the fact that she has this wound and cannot get in the water. That is a significant issue for her at this point. Otherwise does have noncompressible pulses based on what we are seeing today. She wears compression socks for her chronic venous insufficiency she has had previous ablation therapy. 05/29/2021 upon evaluation today patient appears to be doing well with regard to her wound. Overall this is actually showing signs of good improvement with significant overall decrease in size since last week even. Overall I feel like that she is doing awesome and making excellent progress. Over at [facility] they been taking good care of her. 06/14/2021 upon evaluation today patient appears to be doing excellent in regard to her wound. In fact she is completely healed based on what we are seeing currently. I do not see any signs of active infection at this time which is great news. 10/05/2022 [name] is an 89 year old female with a past medical history of chronic venous insufficiency and atrial fibrillation on warfarin that presents the clinic for a 1-34-monthhistory of nonhealing ulcer to the anterior aspect of the right lower extremity. She states she hit her leg against an object in the beginning of December. She describes what started out as a hematoma and was debrided by a physician in her facility. She has been using Xeroform to the wound bed. She currently denies signs of infection. She has been wearing her compression stocking. 1/31; patient presents for follow-up. She has been using Hydrofera Blue and Santyl to the wound bed daily. There is been improvement in wound healing. She is HWYLDER, LAVECK([id number] Page 6 of 7 scheduled for ABIs with TBI's today. She has no issues or complaints today. She denies signs of infection. 2/7; patient presents for follow-up. She has been  using Hydrofera Blue and Santyl to the wound bed. She had her ABIs with TBI's however the results have not been released. She has no issues or complaints today. 2/14; patient presents for follow-up. She has been using Hydrofera Blue and Santyl to the wound bed. Her ABI resulted and showed noncompressible on the right with a TBI of 0.88  with triphasic waveforms. Objective Constitutional Vitals Time Taken: 12:37 PM, Height: 68 in, Weight: 137 lbs, BMI: 20.8, Temperature: 97.4 F, Pulse: 98 bpm, Respiratory Rate: 16 breaths/min, Blood Pressure: 118/74 mmHg. General Notes: Right lower extremity: T the anterior aspect there is an open wound with granulation tissue and nonviable tissue. 2+ pitting edema to the knee. o No surrounding signs of infection including increased warmth, erythema or purulent drainage. Venous stasis dermatitis throughout the leg. Integumentary (Hair, Skin) Wound #2 status is Open. Original cause of wound was Skin T ear/Laceration. The date acquired was: 08/30/2022. The wound has been in treatment 3 [name]. The wound is located on the Right,Anterior Lower Leg. The wound measures 6.5cm length x 5cm width x 0.2cm depth; 25.525cm^2 area and 5.105cm^3 volume. There is Fat Layer (Subcutaneous Tissue) exposed. There is a medium amount of serosanguineous drainage noted. There is small (1-33%) red, pink granulation within the wound bed. There is a large (67-100%) amount of necrotic tissue within the wound bed including Adherent Slough. Assessment Active Problems ICD-10 Non-pressure chronic ulcer of other part of right lower leg with fat layer exposed Chronic venous hypertension (idiopathic) with ulcer of right lower extremity Chronic atrial fibrillation, unspecified Long term (current) use of anticoagulants Patient's wound appears well-healing. I debrided nonviable tissue. Based on her ABI results she should have adequate blood flow for healing. I recommended Hydrofera Blue and  Santyl under compression wrap. Plan 1. In office sharp debridement 2. Hydrofera Blue and Santyl under 3 layer compressionooright lower extremity 3. Follow-up in 1 week Electronic Signature(s) Signed: 10/30/2022 3:27:54 PM By: Mackenzie Shan DO Entered By: [name] on 10/30/2022 13:25:35 SuperBill Details -------------------------------------------------------------------------------- [name] ([id number]) [id number] Page 7 of 7 Patient Name: Date of Service: Mackenzie [name], [place] 10/30/2022 Medical Record Number: [id number] Patient Account Number: [id number] Date of Birth/Sex: Treating RN: [name][birth year] (89 y.o. [name] Primary Care Provider: Eliezer [name] Other Clinician: Referring Provider: Treating Provider/Extender: [name], [name] in Treatment: 3 Diagnosis Coding ICD-10 Codes Code Description [phone] Non-pressure chronic ulcer of other part of right lower leg with fat layer exposed I87.311 Chronic venous hypertension (idiopathic) with ulcer of right lower extremity I48.20 Chronic atrial fibrillation, unspecified Z79.01 Long term (current) use of anticoagulants Facility Procedures CPT4 Code Description Modifier Quantity [id number] - DEB SUBQ TISSUE 20 SQ CM/< 1 ICD-10 Diagnosis Description L97.812 Non-pressure chronic ulcer of other part of right lower leg with fat layer exposed I87.311 Chronic venous hypertension (idiopathic) with ulcer of right lower extremity Physician Procedures Quantity CPT4 Code Description Modifier [id number] - WC PHYS SUBQ TISS 20 SQ CM 1 ICD-10 Diagnosis Description L97.812 Non-pressure chronic ulcer of other part of right lower leg with fat layer exposed I87.311 Chronic venous hypertension (idiopathic) with ulcer of right lower extremity Electronic Signature(s) Signed: 10/31/2022 12:43:55 PM By: Mackenzie Loud MSN RN CNS WTA Signed: 10/31/2022 1:35:56 PM By: Mackenzie Shan  DO Previous Signature: 10/30/2022 3:27:54 PM Version By: Mackenzie Shan DO Previous Signature: 10/30/2022 4:19:55 PM Version By: Mackenzie Loud MSN RN CNS WTA Entered By: [name] on 10/31/2022 12:43:55

## 2022-10-31 NOTE — Progress Notes (Addendum)
[name] ([id number]) [id number] Page 1 of 10 Visit Report for 10/30/2022 Arrival Information Details Patient Name: Date of Service: [name], [place] 10/30/2022 12:30 PM Medical Record Number: [id number] Patient Account Number: [id number] Date of Birth/Sex: Treating RN: [birth year] (89 y.o. [name] Primary Care Numa Heatwole: [name] Other Clinician: Referring Orchid Glassberg: Treating Otis Burress/Extender: [name], [name] in Treatment: 3 Visit Information History Since Last Visit Added or deleted any medications: No Patient Arrived: Ambulatory Any new allergies or adverse reactions: No Arrival Time: 12:33 Had a fall or experienced change in No Accompanied By: self activities of daily living that may affect Transfer Assistance: None risk of falls: Patient Identification Verified: Yes Hospitalized since last visit: No Secondary Verification Process Completed: Yes Has Dressing in Place as Prescribed: Yes Patient Requires Transmission-Based Precautions: No Pain Present Now: No Patient Has Alerts: Yes Patient Alerts: Patient on Blood Thinner Warfarin Electronic Signature(s) Signed: 10/31/2022 12:42:41 PM By: Rosalio Loud MSN RN CNS WTA Previous Signature: 10/30/2022 4:19:55 PM Version By: Rosalio Loud MSN RN CNS WTA Entered By: Rosalio Loud on 10/31/2022 12:42:41 -------------------------------------------------------------------------------- Clinic Level of Care Assessment Details Patient Name: Date of Service: [name], [name] 10/30/2022 12:30 PM Medical Record Number: [id number] Patient Account Number: [id number] Date of Birth/Sex: Treating RN: 23-Mar-1932 (89 y.o. [name] Primary Care Kimberly Coye: [name] Other Clinician: Referring Savoy Somerville: Treating Laiya Wisby/Extender: [name], [name] in Treatment: 3 Clinic Level of Care Assessment Items TOOL 1 Quantity Score []$  - 0 Use when EandM and Procedure  is performed on INITIAL visit ASSESSMENTS - Nursing Assessment / Reassessment []$  - 0 General Physical Exam (combine w/ comprehensive assessment (listed just below) when performed on new pt. evals) []$  - 0 Comprehensive Assessment (HX, ROS, Risk Assessments, Wounds Hx, etc.) ASSESSMENTS - Wound and Skin Assessment / Reassessment []$  - 0 Dermatologic / Skin Assessment (not related to wound area) Hight, [name] ([id number]) [id number] Page 2 of 10 ASSESSMENTS - Ostomy and/or Continence Assessment and Care []$  - 0 Incontinence Assessment and Management []$  - 0 Ostomy Care Assessment and Management (repouching, etc.) PROCESS - Coordination of Care []$  - 0 Simple Patient / Family Education for ongoing care []$  - 0 Complex (extensive) Patient / Family Education for ongoing care []$  - 0 Staff obtains Programmer, systems, Records, T Results / Process Orders est []$  - 0 Staff telephones HHA, Nursing Homes / Clarify orders / etc []$  - 0 Routine Transfer to another Facility (non-emergent condition) []$  - 0 Routine Hospital Admission (non-emergent condition) []$  - 0 New Admissions / Biomedical engineer / Ordering NPWT Apligraf, etc. , []$  - 0 Emergency Hospital Admission (emergent condition) PROCESS - Special Needs []$  - 0 Pediatric / Minor Patient Management []$  - 0 Isolation Patient Management []$  - 0 Hearing / Language / Visual special needs []$  - 0 Assessment of Community assistance (transportation, D/C planning, etc.) []$  - 0 Additional assistance / Altered mentation []$  - 0 Support Surface(s) Assessment (bed, cushion, seat, etc.) INTERVENTIONS - Miscellaneous []$  - 0 External ear exam []$  - 0 Patient Transfer (multiple staff / Civil Service fast streamer / Similar devices) []$  - 0 Simple Staple / Suture removal (25 or less) []$  - 0 Complex Staple / Suture removal (26 or more) []$  - 0 Hypo/Hyperglycemic Management (do not check if billed separately) []$  - 0 Ankle / Brachial Index (ABI)  - do not check if billed separately Has the patient been seen at the hospital within the last three years:  Yes Total Score: 0 Level Of Care: ____ Electronic Signature(s) Signed: 11/01/2022 1:32:16 PM By: Rosalio Loud MSN RN CNS WTA Previous Signature: 10/30/2022 4:19:55 PM Version By: Rosalio Loud MSN RN CNS WTA Entered By: Rosalio Loud on 10/31/2022 12:43:47 -------------------------------------------------------------------------------- Compression Therapy Details Patient Name: Date of Service: [name], [name] 10/30/2022 12:30 PM Medical Record Number: [id number] Patient Account Number: [id number] Date of Birth/Sex: Treating RN: Mar 16, 1932 (89 y.o. [name] Primary Care Arvella Massingale: [name] Other Clinician: Referring Benancio Osmundson: Treating Meghan Tiemann/Extender: [name], [name] in Treatment: 3 Compression Therapy Performed for Wound Assessment: Wound #2 Right,Anterior Lower Leg [name], Mackenzie ([id number]) [id number] Page 3 of 10 Performed By: Clinician Rosalio Loud, RN Compression Type: Three Layer Post Procedure Diagnosis Same as Pre-procedure Electronic Signature(s) Signed: 10/30/2022 4:19:55 PM By: Rosalio Loud MSN RN CNS WTA Entered By: Rosalio Loud on 10/30/2022 13:26:39 -------------------------------------------------------------------------------- Encounter Discharge Information Details Patient Name: Date of Service: [name], [name] 10/30/2022 12:30 PM Medical Record Number: [id number] Patient Account Number: [id number] Date of Birth/Sex: Treating RN: Oct 11, 1931 (89 y.o. [name] Primary Care Ki Corbo: [name] Other Clinician: Referring Taytem Ghattas: Treating Brantley Wiley/Extender: [name], [name] in Treatment: 3 Encounter Discharge Information Items Post Procedure Vitals Discharge Condition: Stable Temperature (F): 97.4 Ambulatory Status: Cane Pulse (bpm): 98 Discharge Destination:  Home Respiratory Rate (breaths/min): 16 Transportation: Private Auto Blood Pressure (mmHg): 118/74 Accompanied By: self Schedule Follow-up Appointment: Yes Clinical Summary of Care: Electronic Signature(s) Signed: 10/31/2022 12:45:14 PM By: Rosalio Loud MSN RN CNS WTA Previous Signature: 10/31/2022 12:38:11 PM Version By: Rosalio Loud MSN RN CNS WTA Previous Signature: 10/30/2022 4:19:55 PM Version By: Rosalio Loud MSN RN CNS WTA Entered By: Rosalio Loud on 10/31/2022 12:45:13 -------------------------------------------------------------------------------- Lower Extremity Assessment Details Patient Name: Date of Service: [name], [name] 10/30/2022 12:30 PM Medical Record Number: [id number] Patient Account Number: [id number] Date of Birth/Sex: Treating RN: 06-03-1932 (89 y.o. [name] Primary Care Aarik Blank: [name] Other Clinician: Referring Sebert Stollings: Treating Tahiri Shareef/Extender: [name], [name] in Treatment: 3 Edema Assessment Assessed: [Left: No] [Right: No] H[Left: OFF, [name] ([id number] [Right: [id number] Page 4 of 10] [Left: Edema] [Right: :] Calf Left: Right: Point of Measurement: 34 cm From Medial Instep 33 cm Ankle Left: Right: Point of Measurement: 12 cm From Medial Instep 19 cm Vascular Assessment Pulses: Dorsalis Pedis Palpable: [Right:Yes] Electronic Signature(s) Signed: 10/31/2022 12:43:01 PM By: Rosalio Loud MSN RN CNS WTA Previous Signature: 10/30/2022 4:19:55 PM Version By: Rosalio Loud MSN RN CNS WTA Entered By: Rosalio Loud on 10/31/2022 12:43:01 -------------------------------------------------------------------------------- Multi Wound Chart Details Patient Name: Date of Service: [name], [name] 10/30/2022 12:30 PM Medical Record Number: [id number] Patient Account Number: [id number] Date of Birth/Sex: Treating RN: [birth year] (89 y.o. [name] Primary Care Pistol Kessenich: [name] Other  Clinician: Referring Maiya Kates: Treating Jaykob Minichiello/Extender: [name], [name] in Treatment: 3 Vital Signs Height(in): 68 Pulse(bpm): 98 Weight(lbs): 137 Blood Pressure(mmHg): 118/74 Body Mass Index(BMI): 20.8 Temperature(F): 97.4 Respiratory Rate(breaths/min): 16 [2:Photos:] [N/A:N/A] Right, Anterior Lower Leg N/A N/A Wound Location: Skin Tear/Laceration N/A N/A Wounding Event: Skin Tear N/A N/A Primary Etiology: Cataracts, Arrhythmia, Osteoarthritis, N/A N/A Comorbid History: Neuropathy 08/30/2022 N/A N/A Date Acquired: 3 N/A N/A Weeks of Treatment: Open N/A N/A Wound Status: No N/A N/A Wound Recurrence: 6.5x5x0.2 N/A N/A Measurements L x W x D (cm) 25.525 N/A N/A A (cm) : rea 5.105 N/A N/A Volume (cm) : Ousley, MARY  Opal Sidles ([id number]) [id number] Page 5 of 10 21.20% N/A N/A % Reduction in Area: -57.60% N/A N/A % Reduction in Volume: Full Thickness Without Exposed N/A N/A Classification: Support Structures Medium N/A N/A Exudate A mount: Serosanguineous N/A N/A Exudate Type: red, brown N/A N/A Exudate Color: Small (1-33%) N/A N/A Granulation A mount: Red, Pink N/A N/A Granulation Quality: Large (67-100%) N/A N/A Necrotic A mount: Fat Layer (Subcutaneous Tissue): Yes N/A N/A Exposed Structures: Fascia: No Tendon: No Muscle: No Joint: No Bone: No Small (1-33%) N/A N/A Epithelialization: Debridement - Excisional N/A N/A Debridement: Pre-procedure Verification/Time Out 13:03 N/A N/A Taken: Lidocaine 4% Topical Solution N/A N/A Pain Control: Subcutaneous, Slough N/A N/A Tissue Debrided: Skin/Subcutaneous Tissue N/A N/A Level: 32.5 N/A N/A Debridement A (sq cm): rea Curette N/A N/A Instrument: Minimum N/A N/A Bleeding: Pressure N/A N/A Hemostasis A chieved: Procedure was tolerated well N/A N/A Debridement Treatment Response: 6.5x5x0.3 N/A N/A Post Debridement Measurements L x W x D  (cm) 7.658 N/A N/A Post Debridement Volume: (cm) Compression Therapy N/A N/A Procedures Performed: Debridement Treatment Notes Wound #2 (Lower Leg) Wound Laterality: Right, Anterior Cleanser Soap and Water Discharge Instruction: Gently cleanse wound with antibacterial soap, rinse and pat dry prior to dressing wounds Peri-Wound Care Topical Santyl Collagenase Ointment, 30 (gm), tube Discharge Instruction: apply nickel thick to wound bed only Primary Dressing Hydrofera Blue Ready Transfer Foam, 2.5x2.5 (in/in) Discharge Instruction: Apply Hydrofera Blue Ready to wound bed as directed Secondary Dressing ABD Pad 5x9 (in/in) Discharge Instruction: Cover with ABD pad Secured With Compression Wrap 3-LAYER WRAP - Profore Lite LF 3 Multilayer Compression Bandaging System Discharge Instruction: Apply 3 multi-layer wrap as prescribed. Compression Stockings Add-Ons Electronic Signature(s) Signed: 10/31/2022 12:43:06 PM By: Rosalio Loud MSN RN CNS WTA Previous Signature: 10/30/2022 4:19:55 PM Version By: Rosalio Loud MSN RN CNS WTA Entered By: Rosalio Loud on 10/31/2022 12:43:06 Bouchie, [name] ([id number]) [id number] Page 6 of 10 -------------------------------------------------------------------------------- Multi-Disciplinary Care Plan Details Patient Name: Date of Service: [name], [name] 10/30/2022 12:30 PM Medical Record Number: [id number] Patient Account Number: [id number] Date of Birth/Sex: Treating RN: [birth year] (89 y.o. [name] Primary Care Windsor Zirkelbach: [name] Other Clinician: Referring Rayya Yagi: Treating Thailan Sava/Extender: [name], [name] in Treatment: 3 Active Inactive Necrotic Tissue Nursing Diagnoses: Impaired tissue integrity related to necrotic/devitalized tissue Knowledge deficit related to management of necrotic/devitalized tissue Goals: Necrotic/devitalized tissue will be minimized in the wound bed Date  Initiated: 10/09/2022 Target Resolution Date: 11/09/2022 Goal Status: Active Patient/caregiver will verbalize understanding of reason and process for debridement of necrotic tissue Date Initiated: 10/09/2022 Target Resolution Date: 11/09/2022 Goal Status: Active Interventions: Assess patient pain level pre-, during and post procedure and prior to discharge Provide education on necrotic tissue and debridement process Treatment Activities: Apply topical anesthetic as ordered : 10/09/2022 Excisional debridement : 10/09/2022 Notes: Venous Leg Ulcer Nursing Diagnoses: Actual venous Insuffiency (use after diagnosis is confirmed) Knowledge deficit related to disease process and management Goals: Patient will maintain optimal edema control Date Initiated: 10/09/2022 Target Resolution Date: 11/09/2022 Goal Status: Active Patient/caregiver will verbalize understanding of disease process and disease management Date Initiated: 10/09/2022 Target Resolution Date: 11/09/2022 Goal Status: Active Verify adequate tissue perfusion prior to therapeutic compression application Date Initiated: 10/09/2022 Target Resolution Date: 11/09/2022 Goal Status: Active Interventions: Assess peripheral edema status every visit. Provide education on venous insufficiency Treatment Activities: T ordered outside of clinic : 10/09/2022 est Notes: Wound/Skin Impairment TIMMYA, DOSER ([id number]) [id number] Page 7 of 10 Nursing Diagnoses:  Impaired tissue integrity Knowledge deficit related to ulceration/compromised skin integrity Goals: Patient will have a decrease in wound volume by X% from date: (specify in notes) Date Initiated: 10/09/2022 Target Resolution Date: 11/09/2022 Goal Status: Active Patient/caregiver will verbalize understanding of skin care regimen Date Initiated: 10/09/2022 Target Resolution Date: 11/09/2022 Goal Status: Active Ulcer/skin breakdown will have a volume reduction  of 30% by week 4 Date Initiated: 10/09/2022 Target Resolution Date: 11/09/2022 Goal Status: Active Ulcer/skin breakdown will have a volume reduction of 50% by week 8 Date Initiated: 10/09/2022 Target Resolution Date: 12/08/2022 Goal Status: Active Ulcer/skin breakdown will have a volume reduction of 80% by week 12 Date Initiated: 10/09/2022 Target Resolution Date: 01/08/2023 Goal Status: Active Ulcer/skin breakdown will heal within 14 weeks Date Initiated: 10/09/2022 Target Resolution Date: 01/22/2023 Goal Status: Active Interventions: Assess patient/caregiver ability to obtain necessary supplies Assess patient/caregiver ability to perform ulcer/skin care regimen upon admission and as needed Assess ulceration(s) every visit Provide education on ulcer and skin care Treatment Activities: Skin care regimen initiated : 10/09/2022 Notes: Electronic Signature(s) Signed: 10/31/2022 12:44:08 PM By: Rosalio Loud MSN RN CNS WTA Previous Signature: 10/30/2022 4:19:55 PM Version By: Rosalio Loud MSN RN CNS WTA Entered By: Rosalio Loud on 10/31/2022 12:44:07 -------------------------------------------------------------------------------- Pain Assessment Details Patient Name: Date of Service: [name], [name] 10/30/2022 12:30 PM Medical Record Number: [id number] Patient Account Number: [id number] Date of Birth/Sex: Treating RN: May 29, 1932 (89 y.o. [name] Primary Care Mandy Peeks: [name] Other Clinician: Referring Jaelon Gatley: Treating Marsia Cino/Extender: [name], [name] in Treatment: 3 Active Problems Location of Pain Severity and Description of Pain Patient Has Paino No Site Locations AVIGAYIL, [facility] [place] ([id number]) [id number] Page 8 of 10 Pain Management and Medication Current Pain Management: Electronic Signature(s) Signed: 10/31/2022 12:42:53 PM By: Rosalio Loud MSN RN CNS WTA Previous Signature: 10/30/2022 4:19:55 PM Version By: Rosalio Loud  MSN RN CNS WTA Entered By: Rosalio Loud on 10/31/2022 12:42:53 -------------------------------------------------------------------------------- Patient/Caregiver Education Details Patient Name: Date of Service: [name], [name] 2/14/2024andnbsp12:30 PM Medical Record Number: [id number] Patient Account Number: [id number] Date of Birth/Gender: Treating RN: 10-13-1931 (89 y.o. [name] Primary Care Physician: [name] Other Clinician: Referring Physician: Treating Physician/Extender: Weyman Croon Weeks in Treatment: 3 Education Assessment Education Provided To: Patient Education Topics Provided Wound/Skin Impairment: Handouts: Caring for Your Ulcer Methods: Explain/Verbal Responses: State content correctly Electronic Signature(s) Signed: 11/01/2022 1:32:16 PM By: Rosalio Loud MSN RN CNS WTA Previous Signature: 10/30/2022 4:19:55 PM Version By: Rosalio Loud MSN RN CNS WTA Entered By: Rosalio Loud on 10/31/2022 12:44:01 Fleeger, [name] ([id number]) [id number] Page 9 of 10 -------------------------------------------------------------------------------- Wound Assessment Details Patient Name: Date of Service: [name], [name] 10/30/2022 12:30 PM Medical Record Number: [id number] Patient Account Number: [id number] Date of Birth/Sex: Treating RN: Dec 31, 1931 (89 y.o. [name] Primary Care Palyn Scrima: [name] Other Clinician: Referring Ndea Kilroy: Treating Alyric Parkin/Extender: [name], [name] in Treatment: 3 Wound Status Wound Number: 2 Primary Etiology: Skin Tear Wound Location: Right, Anterior Lower Leg Wound Status: Open Wounding Event: Skin Tear/Laceration Comorbid History: Cataracts, Arrhythmia, Osteoarthritis, Neuropathy Date Acquired: 08/30/2022 Weeks Of Treatment: 3 Clustered Wound: No Photos Wound Measurements Length: (cm) 6.5 Width: (cm) 5 Depth: (cm) 0.2 Area: (cm) 25.525 Volume: (cm)  5.105 % Reduction in Area: 21.2% % Reduction in Volume: -57.6% Epithelialization: Small (1-33%) Wound Description Classification: Full Thickness Without Exposed Support Structures Exudate Amount: Medium Exudate Type: Serosanguineous Exudate Color: red, brown Foul Odor  After Cleansing: No Slough/Fibrino Yes Wound Bed Granulation Amount: Small (1-33%) Exposed Structure Granulation Quality: Red, Pink Fascia Exposed: No Necrotic Amount: Large (67-100%) Fat Layer (Subcutaneous Tissue) Exposed: Yes Necrotic Quality: Adherent Slough Tendon Exposed: No Muscle Exposed: No Joint Exposed: No Bone Exposed: No Treatment Notes Wound #2 (Lower Leg) Wound Laterality: Right, Anterior Cleanser Soap and Water Discharge Instruction: Gently cleanse wound with antibacterial soap, rinse and pat dry prior to dressing wounds Peri-Wound Care AGUSTINA, YOUNGBLOOD ([id number]) [id number] Page 10 of 10 Topical Santyl Collagenase Ointment, 30 (gm), tube Discharge Instruction: apply nickel thick to wound bed only Primary Dressing Hydrofera Blue Ready Transfer Foam, 2.5x2.5 (in/in) Discharge Instruction: Apply Hydrofera Blue Ready to wound bed as directed Secondary Dressing ABD Pad 5x9 (in/in) Discharge Instruction: Cover with ABD pad Secured With Compression Wrap 3-LAYER WRAP - Profore Lite LF 3 Multilayer Compression Bandaging System Discharge Instruction: Apply 3 multi-layer wrap as prescribed. Compression Stockings Add-Ons Electronic Signature(s) Signed: 10/30/2022 4:19:55 PM By: Rosalio Loud MSN RN CNS WTA Entered By: Rosalio Loud on 10/30/2022 12:50:33 -------------------------------------------------------------------------------- Vitals Details Patient Name: Date of Service: [name], [name] 10/30/2022 12:30 PM Medical Record Number: [id number] Patient Account Number: [id number] Date of Birth/Sex: Treating RN: 1932-01-03 (89 y.o. [name] Primary Care Cavion Faiola:  [name] Other Clinician: Referring Daryn Pisani: Treating Jaspreet Hollings/Extender: [name], [name] in Treatment: 3 Vital Signs Time Taken: 12:37 Temperature (F): 97.4 Height (in): 68 Pulse (bpm): 98 Weight (lbs): 137 Respiratory Rate (breaths/min): 16 Body Mass Index (BMI): 20.8 Blood Pressure (mmHg): 118/74 Reference Range: 80 - 120 mg / dl Electronic Signature(s) Signed: 10/31/2022 12:42:47 PM By: Rosalio Loud MSN RN CNS WTA Previous Signature: 10/30/2022 4:19:55 PM Version By: Rosalio Loud MSN RN CNS WTA Entered By: Rosalio Loud on 10/31/2022 12:42:47

## 2022-11-01 DIAGNOSIS — I872 Venous insufficiency (chronic) (peripheral): Secondary | ICD-10-CM | POA: Diagnosis not present

## 2022-11-06 ENCOUNTER — Encounter (HOSPITAL_BASED_OUTPATIENT_CLINIC_OR_DEPARTMENT_OTHER): Payer: PPO | Admitting: Internal Medicine

## 2022-11-06 DIAGNOSIS — I87311 Chronic venous hypertension (idiopathic) with ulcer of right lower extremity: Secondary | ICD-10-CM | POA: Diagnosis not present

## 2022-11-06 DIAGNOSIS — L97812 Non-pressure chronic ulcer of other part of right lower leg with fat layer exposed: Secondary | ICD-10-CM

## 2022-11-07 DIAGNOSIS — I480 Paroxysmal atrial fibrillation: Secondary | ICD-10-CM | POA: Diagnosis not present

## 2022-11-07 DIAGNOSIS — Z7901 Long term (current) use of anticoagulants: Secondary | ICD-10-CM | POA: Diagnosis not present

## 2022-11-07 DIAGNOSIS — D802 Selective deficiency of immunoglobulin A [IgA]: Secondary | ICD-10-CM | POA: Diagnosis not present

## 2022-11-07 LAB — POCT INR: INR: 2.2 (ref 2.0–3.0)

## 2022-11-08 ENCOUNTER — Ambulatory Visit (INDEPENDENT_AMBULATORY_CARE_PROVIDER_SITE_OTHER): Payer: PPO

## 2022-11-08 DIAGNOSIS — Z7901 Long term (current) use of anticoagulants: Secondary | ICD-10-CM | POA: Diagnosis not present

## 2022-11-08 NOTE — Progress Notes (Unsigned)
Received INR result of 2.2 from [facility] via email. [place] has [facility] draw labs on Mondays and Thursdays.  Continue 1 tablet daily except take 1/2 tablet on Sundays and Thursdays. Recheck in 4 weeks, on 12/05/22.   LVM for patient with the above instructions. Instructions also emailed to [place] and Verda Cumins at [facility].

## 2022-11-08 NOTE — Patient Instructions (Addendum)
Pre visit review using our clinic review tool, if applicable. No additional management support is needed unless otherwise documented below in the visit note.  Continue 1 tablet daily except take 1/2 tablet on Sundays and Thursdays. Recheck in 4 weeks, on 12/05/22.

## 2022-11-09 NOTE — Progress Notes (Signed)
CHERENE, ROSEMANN Houghton ([id number]) [id number] Page 1 of 8 Visit Report for 11/06/2022 Arrival Information Details Patient Name: Date of Service: HO [name], [place] 11/06/2022 12:30 PM Medical Record Number: [id number] Patient Account Number: [id number] Date of Birth/Sex: Treating RN: 1932/08/11 (89 y.o. [name] Primary Care Pansey Pinheiro: [name] Other Clinician: Referring Daphnie Venturini: Treating Jasiel Belisle/Extender: [name], [name] in Treatment: 4 Visit Information History Since Last Visit Added or deleted any medications: No Patient Arrived: Ambulatory Any new allergies or adverse reactions: No Arrival Time: 12:38 Had a fall or experienced change in No Accompanied By: self activities of daily living that may affect Transfer Assistance: None risk of falls: Patient Identification Verified: Yes Has Dressing in Place as Prescribed: Yes Secondary Verification Process Completed: Yes Pain Present Now: No Patient Requires Transmission-Based Precautions: No Patient Has Alerts: Yes Patient Alerts: Patient on Blood Thinner Warfarin Electronic Signature(s) Signed: 11/08/2022 5:35:32 PM By: Rosalio Loud MSN RN CNS WTA Entered By: Rosalio Loud on 11/06/2022 12:39:19 -------------------------------------------------------------------------------- Clinic Level of Care Assessment Details Patient Name: Date of Service: HO [name], [place] 11/06/2022 12:30 PM Medical Record Number: [id number] Patient Account Number: [id number] Date of Birth/Sex: Treating RN: 1932/05/31 (89 y.o. [name] Primary Care Jaleia Hanke: [name] Other Clinician: Referring Zebbie Ace: Treating Lennex Pietila/Extender: [name], [name] in Treatment: 4 Clinic Level of Care Assessment Items TOOL 1 Quantity Score '[]'$  - 0 Use when EandM and Procedure is performed on INITIAL visit ASSESSMENTS - Nursing Assessment / Reassessment '[]'$  - 0 General Physical Exam (combine  w/ comprehensive assessment (listed just below) when performed on new pt. evals) '[]'$  - 0 Comprehensive Assessment (HX, ROS, Risk Assessments, Wounds Hx, etc.) ASSESSMENTS - Wound and Skin Assessment / Reassessment '[]'$  - 0 Dermatologic / Skin Assessment (not related to wound area) Mancinelli, [name] ([id number]) [id number] Page 2 of 8 ASSESSMENTS - Ostomy and/or Continence Assessment and Care '[]'$  - 0 Incontinence Assessment and Management '[]'$  - 0 Ostomy Care Assessment and Management (repouching, etc.) PROCESS - Coordination of Care '[]'$  - 0 Simple Patient / Family Education for ongoing care '[]'$  - 0 Complex (extensive) Patient / Family Education for ongoing care '[]'$  - 0 Staff obtains Programmer, systems, Records, T Results / Process Orders est '[]'$  - 0 Staff telephones HHA, Nursing Homes / Clarify orders / etc '[]'$  - 0 Routine Transfer to another Facility (non-emergent condition) '[]'$  - 0 Routine Hospital Admission (non-emergent condition) '[]'$  - 0 New Admissions / Biomedical engineer / Ordering NPWT Apligraf, etc. , '[]'$  - 0 Emergency Hospital Admission (emergent condition) PROCESS - Special Needs '[]'$  - 0 Pediatric / Minor Patient Management '[]'$  - 0 Isolation Patient Management '[]'$  - 0 Hearing / Language / Visual special needs '[]'$  - 0 Assessment of Community assistance (transportation, D/C planning, etc.) '[]'$  - 0 Additional assistance / Altered mentation '[]'$  - 0 Support Surface(s) Assessment (bed, cushion, seat, etc.) INTERVENTIONS - Miscellaneous '[]'$  - 0 External ear exam '[]'$  - 0 Patient Transfer (multiple staff / Civil Service fast streamer / Similar devices) '[]'$  - 0 Simple Staple / Suture removal (25 or less) '[]'$  - 0 Complex Staple / Suture removal (26 or more) '[]'$  - 0 Hypo/Hyperglycemic Management (do not check if billed separately) '[]'$  - 0 Ankle / Brachial Index (ABI) - do not check if billed separately Has the patient been seen at the hospital within the last three years: Yes Total  Score: 0 Level Of Care: ____ Electronic Signature(s) Signed: 11/08/2022 5:35:32 PM By: Rosalio Loud MSN  RN CNS WTA Entered By: Rosalio Loud on 11/06/2022 13:12:55 -------------------------------------------------------------------------------- Compression Therapy Details Patient Name: Date of Service: HO [name], [name] 11/06/2022 12:30 PM Medical Record Number: [id number] Patient Account Number: [id number] Date of Birth/Sex: Treating RN: [birth year] (89 y.o. [name] Primary Care Jadan Rouillard: [name] Other Clinician: Referring Anaka Beazer: Treating Cutberto Winfree/Extender: [name], [name] in Treatment: 4 Compression Therapy Performed for Wound Assessment: Wound #2 Right,Anterior Lower Leg Performed By: Clinician Rosalio Loud, RN Compression Type: [address] SHANIA, JOSH [place] ([id number]) [id number] Page 3 of 8 Post Procedure Diagnosis Same as Pre-procedure Electronic Signature(s) Signed: 11/08/2022 5:35:32 PM By: Rosalio Loud MSN RN CNS WTA Entered By: Rosalio Loud on 11/06/2022 13:35:58 -------------------------------------------------------------------------------- Encounter Discharge Information Details Patient Name: Date of Service: [name], [name] 11/06/2022 12:30 PM Medical Record Number: [id number] Patient Account Number: [id number] Date of Birth/Sex: Treating RN: 09-02-1932 (89 y.o. [name] Primary Care Thaddus Mcdowell: [name] Other Clinician: Referring Aryel Edelen: Treating Stephfon Bovey/Extender: [name], [name] in Treatment: 4 Encounter Discharge Information Items Post Procedure Vitals Discharge Condition: Stable Temperature (F): 98.0 Ambulatory Status: Cane Pulse (bpm): 109 Discharge Destination: Home Respiratory Rate (breaths/min): 16 Transportation: Private Auto Blood Pressure (mmHg): 129/81 Accompanied By: self Schedule Follow-up Appointment: Yes Clinical Summary of Care: Electronic  Signature(s) Signed: 11/08/2022 5:35:32 PM By: Rosalio Loud MSN RN CNS WTA Entered By: Rosalio Loud on 11/06/2022 13:36:31 -------------------------------------------------------------------------------- Lower Extremity Assessment Details Patient Name: Date of Service: [name], [name] 11/06/2022 12:30 PM Medical Record Number: [id number] Patient Account Number: [id number] Date of Birth/Sex: Treating RN: 11-29-1931 (89 y.o. [name] Primary Care Onya Eutsler: [name] Other Clinician: Referring Aaralyn Kil: Treating Ronelle Smallman/Extender: [name], [name] in Treatment: 4 Edema Assessment Assessed: [Left: No] [Right: Yes] [Left: Edema] [Right: :] [address], [name] ([id number]) [id number] Page 4 of 8 Left: Right: Point of Measurement: 34 cm From Medial Instep 31 cm Ankle Left: Right: Point of Measurement: 12 cm From Medial Instep 19 cm Vascular Assessment Pulses: Dorsalis Pedis Palpable: [Right:Yes] Electronic Signature(s) Signed: 11/08/2022 5:35:32 PM By: Rosalio Loud MSN RN CNS WTA Entered By: Rosalio Loud on 11/06/2022 12:51:36 -------------------------------------------------------------------------------- Multi Wound Chart Details Patient Name: Date of Service: [name], [name] 11/06/2022 12:30 PM Medical Record Number: [id number] Patient Account Number: [id number] Date of Birth/Sex: Treating RN: 1931/09/21 (89 y.o. [name] Primary Care Leila Schuff: [name] Other Clinician: Referring Pinchos Topel: Treating Jiyaan Steinhauser/Extender: [name], [name] in Treatment: 4 Vital Signs Height(in): 68 Pulse(bpm): 109 Weight(lbs): 137 Blood Pressure(mmHg): 129/81 Body Mass Index(BMI): 20.8 Temperature(F): 98.0 Respiratory Rate(breaths/min): 16 [2:Photos:] [N/A:N/A] Right, Anterior Lower Leg N/A N/A Wound Location: Skin Tear/Laceration N/A N/A Wounding Event: Skin Tear N/A N/A Primary Etiology: Cataracts,  Arrhythmia, Osteoarthritis, N/A N/A Comorbid History: Neuropathy 08/30/2022 N/A N/A Date Acquired: 4 N/A N/A Weeks of Treatment: Open N/A N/A Wound Status: No N/A N/A Wound Recurrence: 6.7x5.4x0.2 N/A N/A Measurements L x W x D (cm) 28.416 N/A N/A A (cm) : rea 5.683 N/A N/A Volume (cm) : 12.30% N/A N/A % Reduction in Area: -75.40% N/A N/A % Reduction in Volume: Full Thickness Without Exposed N/A N/A Classification: Support Structures Medium N/A N/A Exudate AmountSTORMI, GOUDEAU ([id number]) [id number] Page 5 of 8 Serosanguineous N/A N/A Exudate Type: red, brown N/A N/A Exudate Color: Small (1-33%) N/A N/A Granulation Amount: Red, Pink N/A N/A Granulation Quality: Large (67-100%) N/A N/A Necrotic Amount: Fat Layer (Subcutaneous Tissue): Yes N/A N/A Exposed  Structures: Fascia: No Tendon: No Muscle: No Joint: No Bone: No Small (1-33%) N/A N/A Epithelialization: Treatment Notes Electronic Signature(s) Signed: 11/08/2022 5:35:32 PM By: Rosalio Loud MSN RN CNS WTA Entered By: Rosalio Loud on 11/06/2022 12:52:46 -------------------------------------------------------------------------------- Pain Assessment Details Patient Name: Date of Service: [name], [name] 11/06/2022 12:30 PM Medical Record Number: [id number] Patient Account Number: [id number] Date of Birth/Sex: Treating RN: November 13, 1931 (89 y.o. [name] Primary Care Aleeta Schmaltz: [name] Other Clinician: Referring Najae Rathert: Treating Leahanna Buser/Extender: [name], [name] in Treatment: 4 Active Problems Location of Pain Severity and Description of Pain Patient Has Paino No Site Locations Pain Management and Medication Current Pain Management: Electronic Signature(s) Signed: 11/08/2022 5:35:32 PM By: Rosalio Loud MSN RN CNS WTA Entered By: Rosalio Loud on 11/06/2022 12:40:03 Mormile, [name] ([id number]) [id number] Page 6 of  8 -------------------------------------------------------------------------------- Patient/Caregiver Education Details Patient Name: Date of Service: HO [name], [place] 2/21/2024andnbsp12:30 PM Medical Record Number: [id number] Patient Account Number: [id number] Date of Birth/Gender: Treating RN: 05-20-1932 (89 y.o. [name] Primary Care Physician: [name] Other Clinician: Referring Physician: Treating Physician/Extender: Weyman Croon Weeks in Treatment: 4 Education Assessment Education Provided To: Patient Education Topics Provided Wound/Skin Impairment: Handouts: Caring for Your Ulcer Methods: Explain/Verbal Responses: State content correctly Electronic Signature(s) Signed: 11/08/2022 5:35:32 PM By: Rosalio Loud MSN RN CNS WTA Entered By: Rosalio Loud on 11/06/2022 13:13:22 -------------------------------------------------------------------------------- Wound Assessment Details Patient Name: Date of Service: [name], [name] 11/06/2022 12:30 PM Medical Record Number: [id number] Patient Account Number: [id number] Date of Birth/Sex: Treating RN: 19-Jul-1932 (89 y.o. [name] Primary Care Zayna Toste: [name] Other Clinician: Referring Brianni Manthe: Treating Yechezkel Fertig/Extender: [name], [name] in Treatment: 4 Wound Status Wound Number: 2 Primary Etiology: Skin Tear Wound Location: Right, Anterior Lower Leg Wound Status: Open Wounding Event: Skin Tear/Laceration Comorbid History: Cataracts, Arrhythmia, Osteoarthritis, Neuropathy Date Acquired: 08/30/2022 Weeks Of Treatment: 4 Clustered Wound: No Photos HIROKO, FENTY [place] ([id number]) [id number] Page 7 of 8 Wound Measurements Length: (cm) 6.7 Width: (cm) 5.4 Depth: (cm) 0.2 Area: (cm) 28.416 Volume: (cm) 5.683 % Reduction in Area: 12.3% % Reduction in Volume: -75.4% Epithelialization: Small (1-33%) Wound Description Classification: Full  Thickness Without Exposed Support Structures Exudate Amount: Medium Exudate Type: Serosanguineous Exudate Color: red, brown Foul Odor After Cleansing: No Slough/Fibrino Yes Wound Bed Granulation Amount: Small (1-33%) Exposed Structure Granulation Quality: Red, Pink Fascia Exposed: No Necrotic Amount: Large (67-100%) Fat Layer (Subcutaneous Tissue) Exposed: Yes Necrotic Quality: Adherent Slough Tendon Exposed: No Muscle Exposed: No Joint Exposed: No Bone Exposed: No Treatment Notes Wound #2 (Lower Leg) Wound Laterality: Right, Anterior Cleanser Soap and Water Discharge Instruction: Gently cleanse wound with antibacterial soap, rinse and pat dry prior to dressing wounds Peri-Wound Care Topical Santyl Collagenase Ointment, 30 (gm), tube Discharge Instruction: apply nickel thick to wound bed only Primary Dressing Hydrofera Blue Ready Transfer Foam, 2.5x2.5 (in/in) Discharge Instruction: Apply Hydrofera Blue Ready to wound bed as directed Secondary Dressing ABD Pad 5x9 (in/in) Discharge Instruction: Cover with ABD pad Secured With Compression Wrap 3-LAYER WRAP - Profore Lite LF 3 Multilayer Compression Bandaging System Discharge Instruction: Apply 3 multi-layer wrap as prescribed. Compression Stockings Add-Ons Electronic Signature(s) Signed: 11/08/2022 5:35:32 PM By: Rosalio Loud MSN RN CNS WTA Entered By: Rosalio Loud on 11/06/2022 12:50:10 Hassing, [name] ([id number]) [id number] Page 8 of 8 -------------------------------------------------------------------------------- Vitals Details Patient Name: Date of Service: [name], [name] 11/06/2022 12:30 PM Medical Record  Number: [id number] Patient Account Number: [id number] Date of Birth/Sex: Treating RN: Aug 22, 1932 (89 y.o. [name] Primary Care Keaghan Staton: [name] Other Clinician: Referring Ndidi Nesby: Treating Milka Windholz/Extender: [name], [name] in Treatment: 4 Vital  Signs Time Taken: 12:39 Temperature (F): 98.0 Height (in): 68 Pulse (bpm): 109 Weight (lbs): 137 Respiratory Rate (breaths/min): 16 Body Mass Index (BMI): 20.8 Blood Pressure (mmHg): 129/81 Reference Range: 80 - 120 mg / dl Electronic Signature(s) Signed: 11/08/2022 5:35:32 PM By: Rosalio Loud MSN RN CNS WTA Entered By: Rosalio Loud on 11/06/2022 12:39:54

## 2022-11-09 NOTE — Progress Notes (Signed)
Mackenzie, [name] [facility] ([id number]) [id number] Page 1 of 9 Visit Report for 11/06/2022 Chief Complaint Document Details Patient Name: Date of Service: [name], [place] [name] 11/06/2022 12:30 PM Medical Record Number: [id number] Patient Account Number: [id number] Date of Birth/Sex: Treating RN: 01/02/1932 (89 y.o. [name] Primary Care Provider: [name] Other Clinician: Referring Provider: Treating Provider/Extender: [name], [name] in Treatment: 4 Information Obtained from: Patient Chief Complaint 10/09/2022; right lower extremity wound Electronic Signature(s) Signed: 11/06/2022 4:39:06 PM By: Mackenzie Shan DO Entered By: [name] on 11/06/2022 13:29:23 -------------------------------------------------------------------------------- Debridement Details Patient Name: Date of Service: [name], [name] 11/06/2022 12:30 PM Medical Record Number: [id number] Patient Account Number: [id number] Date of Birth/Sex: Treating RN: [birth year] (89 y.o. [name] Primary Care Provider: [name] Other Clinician: Referring Provider: Treating Provider/Extender: [name], [name] in Treatment: 4 Debridement Performed for Assessment: Wound #2 Right,Anterior Lower Leg Performed By: Physician Mackenzie Shan, MD Debridement Type: Debridement Level of Consciousness (Pre-procedure): Awake and Alert Pre-procedure Verification/Time Out Yes - 13:10 Taken: Start Time: 13:10 Pain Control: Lidocaine 4% T opical Solution T Area Debrided (L x W): otal 6.7 (cm) x 5.4 (cm) = 36.18 (cm) Tissue and other material debrided: Viable, Non-Viable, Slough, Subcutaneous, Slough Level: Skin/Subcutaneous Tissue Debridement Description: Excisional Instrument: Curette Bleeding: Minimum Hemostasis Achieved: Pressure Response to Treatment: Procedure was tolerated well Level of Consciousness (Post- Awake and Alert procedure): CYNITHA, OBERHOLTZER ([id number]) [id number] Page 2 of 9 Post Debridement Measurements of Total Wound Length: (cm) 6.7 Width: (cm) 5.4 Depth: (cm) 0.3 Volume: (cm) 8.525 Character of Wound/Ulcer Post Debridement: Stable Post Procedure Diagnosis Same as Pre-procedure Electronic Signature(s) Signed: 11/06/2022 4:39:06 PM By: Mackenzie Shan DO Signed: 11/08/2022 5:35:32 PM By: Mackenzie Loud MSN RN CNS WTA Entered By: [name] on 11/06/2022 13:11:45 -------------------------------------------------------------------------------- HPI Details Patient Name: Date of Service: [name], [name] 11/06/2022 12:30 PM Medical Record Number: [id number] Patient Account Number: [id number] Date of Birth/Sex: Treating RN: 27-May-1932 (89 y.o. [name] Primary Care Provider: [name] Other Clinician: Referring Provider: Treating Provider/Extender: [name], [name] in Treatment: 4 History of Present Illness HPI Description: 05/08/2021 upon evaluation today patient appears for initial inspection here in the clinic concerning issues that she has been having actually with a wound over the anterior portion of her right leg. She tells me that this was due to an injury where she struck this on something causing the initial trauma and she has been having trouble getting it healed. [name] over at [facility] has been helping take care of this. With that being said because it was not healing quite as quickly and effectively is what she wanted to see she did want [place] to take a look and make any recommendations to try to help things along. I am definitely happy to do that for sure. Fortunately there does not appear to be any signs of active infection at this time which is great news. The patient also does not seem to have too deep of the wound which is also good news. Her biggest concern is she really does not want to come weekly to the clinic especially since she has care at  the [facility] location as far as getting the dressing changed and keeping an eye on it. She does have a history of atrial fibrillation for which she is on warfarin and she does not like to do water aerobics twice a week although she  is not able to right now due to the fact that she has this wound and cannot get in the water. That is a significant issue for her at this point. Otherwise does have noncompressible pulses based on what we are seeing today. She wears compression socks for her chronic venous insufficiency she has had previous ablation therapy. 05/29/2021 upon evaluation today patient appears to be doing well with regard to her wound. Overall this is actually showing signs of good improvement with significant overall decrease in size since last week even. Overall I feel like that she is doing awesome and making excellent progress. Over at [facility] they been taking good care of her. 06/14/2021 upon evaluation today patient appears to be doing excellent in regard to her wound. In fact she is completely healed based on what we are seeing currently. I do not see any signs of active infection at this time which is great news. 10/05/2022 [name] is an 89 year old female with a past medical history of chronic venous insufficiency and atrial fibrillation on warfarin that presents the clinic for a 1-94-monthhistory of nonhealing ulcer to the anterior aspect of the right lower extremity. She states she hit her leg against an object in the beginning of December. She describes what started out as a hematoma and was debrided by a physician in her facility. She has been using Xeroform to the wound bed. She currently denies signs of infection. She has been wearing her compression stocking. 1/31; patient presents for follow-up. She has been using Hydrofera Blue and Santyl to the wound bed daily. There is been improvement in wound healing. She is scheduled for ABIs with TBI's today. She has no  issues or complaints today. She denies signs of infection. 2/7; patient presents for follow-up. She has been using Hydrofera Blue and Santyl to the wound bed. She had her ABIs with TBI's however the results have not been released. She has no issues or complaints today. 2/14; patient presents for follow-up. She has been using Hydrofera Blue and Santyl to the wound bed. Her ABI resulted and showed noncompressible on the right with a TBI of 0.88 with triphasic waveforms. 2/21; patient presents for follow-up. We have been using Hydrofera Blue and Santyl to the wound bed under 3 layer compression. She has no issues or complaints today. There is been improvement in wound healing. Electronic Signature(s) HGAZELLA, STERMANJSarben([id number] Page 3 of 9 Signed: 11/06/2022 4:39:06 PM By: HKalman ShanDO Entered By: HKalman Shanon 11/06/2022 13:53:39 -------------------------------------------------------------------------------- Physical Exam Details Patient Name: Date of Service: [name], [place] [name] 11/06/2022 12:30 PM Medical Record Number: [id number] Account Number: [id number] of Birth/Sex: Treating RN: [id number](89y.o. FDrema PryPrimary Care Provider: BEliezer LoftsOther Clinician: Referring Provider: Treating Provider/Extender: HArmandina Stammer [name] in Treatment: 4 Constitutional . Cardiovascular . Psychiatric . Notes Right lower extremity: T the anterior aspect there is an open wound with granulation tissue and nonviable tissue. 2+ pitting edema to the knee. No surrounding o signs of infection including increased warmth, erythema or purulent drainage. Venous stasis dermatitis throughout the leg. Electronic Signature(s) Signed: 11/06/2022 4:39:06 PM By: HKalman ShanDO Entered By: HKalman Shanon 11/06/2022 13:54:48 -------------------------------------------------------------------------------- Physician  Orders Details Patient Name: Date of Service: [name], [name] 11/06/2022 12:30 PM Medical Record Number: [id number] Account Number: [id number] of Birth/Sex: Treating RN: [id number](89y.o. FDrema PryPrimary Care Provider: BEliezer Lofts  Other Clinician: Referring Provider: Treating Provider/Extender: [name], [name] in Treatment: 4 Verbal / Phone Orders: No Diagnosis Coding Follow-up Appointments Return Appointment in 1 week. Bathing/ [employer] wounds with antibacterial soap and water. May shower with wound dressing protected with water repellent cover or cast protector. MUNIRA, HAAPALA [place] ([id number]) [id number] Page 4 of 9 No tub bath. Anesthetic (Use 'Patient Medications' Section for Anesthetic Order Entry) Lidocaine applied to wound bed Edema Control - Lymphedema / Segmental Compressive Device / Other Right Lower Extremity 3 Layer Compression System for Lymphedema. Elevate, Exercise Daily and A void Standing for Long Periods of Time. Elevate legs to the level of the heart and pump ankles as often as possible Wound Treatment Wound #2 - Lower Leg Wound Laterality: Right, Anterior Cleanser: Soap and Water 3 x Per Week/30 Days Discharge Instructions: Gently cleanse wound with antibacterial soap, rinse and pat dry prior to dressing wounds Topical: Santyl Collagenase Ointment, 30 (gm), tube 3 x Per Week/30 Days Discharge Instructions: apply nickel thick to wound bed only Prim Dressing: Hydrofera Blue Ready Transfer Foam, 2.5x2.5 (in/in) 3 x Per Week/30 Days ary Discharge Instructions: Apply Hydrofera Blue Ready to wound bed as directed Secondary Dressing: ABD Pad 5x9 (in/in) 3 x Per Week/30 Days Discharge Instructions: Cover with ABD pad Compression Wrap: 3-LAYER WRAP - Profore Lite LF 3 Multilayer Compression Bandaging System (Generic) 3 x Per Week/30 Days Discharge Instructions: Apply 3 multi-layer wrap as  prescribed. Electronic Signature(s) Signed: 11/06/2022 4:39:06 PM By: Mackenzie Shan DO Entered By: [name] on 11/06/2022 14:15:18 -------------------------------------------------------------------------------- Problem List Details Patient Name: Date of Service: [name], [name] 11/06/2022 12:30 PM Medical Record Number: [id number] Patient Account Number: [id number] Date of Birth/Sex: Treating RN: 1931/09/21 (89 y.o. [name] Primary Care Provider: [name] Other Clinician: Referring Provider: Treating Provider/Extender: [name], [name] in Treatment: 4 Active Problems ICD-10 Encounter Code Description Active Date MDM Diagnosis [phone] Non-pressure chronic ulcer of other part of right lower leg with fat layer 10/09/2022 No Yes exposed I87.311 Chronic venous hypertension (idiopathic) with ulcer of right lower extremity 10/09/2022 No Yes I48.20 Chronic atrial fibrillation, unspecified 10/09/2022 No Yes Z79.01 Long term (current) use of anticoagulants 10/09/2022 No Yes Vandall, [name] ([id number]) [id number] Page 5 of 9 Inactive Problems Resolved Problems Electronic Signature(s) Signed: 11/06/2022 4:39:06 PM By: Mackenzie Shan DO Entered By: [name] on 11/06/2022 13:28:59 -------------------------------------------------------------------------------- Progress Note Details Patient Name: Date of Service: [name], [name] 11/06/2022 12:30 PM Medical Record Number: [id number] Patient Account Number: [id number] Date of Birth/Sex: Treating RN: December 12, 1931 (89 y.o. [name] Primary Care Provider: [name] Other Clinician: Referring Provider: Treating Provider/Extender: [name], [name] in Treatment: 4 Subjective Chief Complaint Information obtained from Patient 10/09/2022; right lower extremity wound History of Present Illness (HPI) 05/08/2021 upon evaluation today patient appears  for initial inspection here in the clinic concerning issues that she has been having actually with a wound over the anterior portion of her right leg. She tells me that this was due to an injury where she struck this on something causing the initial trauma and she has been having trouble getting it healed. [name] over at [facility] has been helping take care of this. With that being said because it was not healing quite as quickly and effectively is what she wanted to see she did want [place] to take a look and make any recommendations to try to help things along. I  am definitely happy to do that for sure. Fortunately there does not appear to be any signs of active infection at this time which is great news. The patient also does not seem to have too deep of the wound which is also good news. Her biggest concern is she really does not want to come weekly to the clinic especially since she has care at the [facility] location as far as getting the dressing changed and keeping an eye on it. She does have a history of atrial fibrillation for which she is on warfarin and she does not like to do water aerobics twice a week although she is not able to right now due to the fact that she has this wound and cannot get in the water. That is a significant issue for her at this point. Otherwise does have noncompressible pulses based on what we are seeing today. She wears compression socks for her chronic venous insufficiency she has had previous ablation therapy. 05/29/2021 upon evaluation today patient appears to be doing well with regard to her wound. Overall this is actually showing signs of good improvement with significant overall decrease in size since last week even. Overall I feel like that she is doing awesome and making excellent progress. Over at [facility] they been taking good care of her. 06/14/2021 upon evaluation today patient appears to be doing excellent in regard to her wound. In fact she is completely  healed based on what we are seeing currently. I do not see any signs of active infection at this time which is great news. 10/05/2022 [name] is an 89 year old female with a past medical history of chronic venous insufficiency and atrial fibrillation on warfarin that presents the clinic for a 1-96-monthhistory of nonhealing ulcer to the anterior aspect of the right lower extremity. She states she hit her leg against an object in the beginning of December. She describes what started out as a hematoma and was debrided by a physician in her facility. She has been using Xeroform to the wound bed. She currently denies signs of infection. She has been wearing her compression stocking. 1/31; patient presents for follow-up. She has been using Hydrofera Blue and Santyl to the wound bed daily. There is been improvement in wound healing. She is scheduled for ABIs with TBI's today. She has no issues or complaints today. She denies signs of infection. 2/7; patient presents for follow-up. She has been using Hydrofera Blue and Santyl to the wound bed. She had her ABIs with TBI's however the results have not been released. She has no issues or complaints today. 2/14; patient presents for follow-up. She has been using Hydrofera Blue and Santyl to the wound bed. Her ABI resulted and showed noncompressible on the right with a TBI of 0.88 with triphasic waveforms. 2/21; patient presents for follow-up. We have been using Hydrofera Blue and Santyl to the wound bed under 3 layer compression. She has no issues or complaints today. There is been improvement in wound healing. HDENEKA, REEHJWest [place]([id number] Page 6 of 9 Objective Constitutional Vitals Time Taken: 12:39 PM, Height: 68 in, Weight: 137 lbs, BMI: 20.8, Temperature: 98.0 F, Pulse: 109 bpm, Respiratory Rate: 16 breaths/min, Blood Pressure: 129/81 mmHg. General Notes: Right lower extremity: T the anterior aspect  there is an open wound with granulation tissue and nonviable tissue. 2+ pitting edema to the knee. o No surrounding signs of infection including increased warmth, erythema or purulent drainage. Venous stasis dermatitis  throughout the leg. Integumentary (Hair, Skin) Wound #2 status is Open. Original cause of wound was Skin T ear/Laceration. The date acquired was: 08/30/2022. The wound has been in treatment 4 weeks. The wound is located on the Right,Anterior Lower Leg. The wound measures 6.7cm length x 5.4cm width x 0.2cm depth; 28.416cm^2 area and 5.683cm^3 volume. There is Fat Layer (Subcutaneous Tissue) exposed. There is a medium amount of serosanguineous drainage noted. There is small (1-33%) red, pink granulation within the wound bed. There is a large (67-100%) amount of necrotic tissue within the wound bed including Adherent Slough. Assessment Active Problems ICD-10 Non-pressure chronic ulcer of other part of right lower leg with fat layer exposed Chronic venous hypertension (idiopathic) with ulcer of right lower extremity Chronic atrial fibrillation, unspecified Long term (current) use of anticoagulants Patient's wound appears well-healing. There are more islands of epithelization. I debrided nonviable tissue. I recommended continuing the course with Santyl and Hydrofera Blue under 3 layer compression to the right lower extremity. Follow-up in 1 week Procedures Wound #2 Pre-procedure diagnosis of Wound #2 is a Skin T located on the Right,Anterior Lower Leg . There was a Excisional Skin/Subcutaneous Tissue Debridement ear with a total area of 36.18 sq cm performed by Mackenzie Shan, MD. With the following instrument(s): Curette to remove Viable and Non-Viable tissue/material. Material removed includes Subcutaneous Tissue and Slough and after achieving pain control using Lidocaine 4% T opical Solution. No specimens were taken. A time out was conducted at 13:10, prior to the start of the  procedure. A Minimum amount of bleeding was controlled with Pressure. The procedure was tolerated well. Post Debridement Measurements: 6.7cm length x 5.4cm width x 0.3cm depth; 8.525cm^3 volume. Character of Wound/Ulcer Post Debridement is stable. Post procedure Diagnosis Wound #2: Same as Pre-Procedure Pre-procedure diagnosis of Wound #2 is a Skin T located on the Right,Anterior Lower Leg . There was a Three Layer Compression Therapy Procedure by ear Mackenzie Loud, RN. Post procedure Diagnosis Wound #2: Same as Pre-Procedure Plan Follow-up Appointments: Return Appointment in 1 week. Bathing/ Shower/ Hygiene: Wash wounds with antibacterial soap and water. May shower with wound dressing protected with water repellent cover or cast protector. No tub bath. Anesthetic (Use 'Patient Medications' Section for Anesthetic Order Entry): Lidocaine applied to wound bed Edema Control - Lymphedema / Segmental Compressive Device / Other: 3 Layer Compression System for Lymphedema. Elevate, Exercise Daily and Avoid Standing for Long Periods of Time. Elevate legs to the level of the heart and pump ankles as often as possible WOUND #2: - Lower Leg Wound Laterality: Right, Anterior Cleanser: Soap and Water 3 x Per Week/30 Days Discharge Instructions: Gently cleanse wound with antibacterial soap, rinse and pat dry prior to dressing wounds Topical: Santyl Collagenase Ointment, 30 (gm), tube 3 x Per Week/30 Days Discharge Instructions: apply nickel thick to wound bed only Prim Dressing: Hydrofera Blue Ready Transfer Foam, 2.5x2.5 (in/in) 3 x Per Week/30 Days ary Discharge Instructions: Apply Hydrofera Blue Ready to wound bed as directed Secondary Dressing: ABD Pad 5x9 (in/in) 3 x Per Week/30 Days CHRYSTELLE, BAGAN ([id number]) [id number] Page 7 of 9 Discharge Instructions: Cover with ABD pad Compression Wrap: 3-LAYER WRAP - Profore Lite LF 3 Multilayer Compression Bandaging System  (Generic) 3 x Per Week/30 Days Discharge Instructions: Apply 3 multi-layer wrap as prescribed. 1. in office sharp debridement 2. santyl and hydrofera blue under 3 layer compression 3. follow up in one week Electronic Signature(s) Signed: 11/06/2022 4:39:06 PM By: Mackenzie Shan DO Entered By: Heber [place],  Courteny Egler on 11/06/2022 14:14:43 -------------------------------------------------------------------------------- ROS/PFSH Details Patient Name: Date of Service: [name], [place] [name] 11/06/2022 12:30 PM Medical Record Number: [id number] Patient Account Number: [id number] Date of Birth/Sex: Treating RN: 07-Oct-1931 (89 y.o. [name] Primary Care Provider: [name] Other Clinician: Referring Provider: Treating Provider/Extender: [name], [name] in Treatment: 4 Information Obtained From Patient Eyes Medical History: Positive for: Cataracts Cardiovascular Medical History: Positive for: Arrhythmia - Afib Endocrine Medical History: Negative for: Type I Diabetes; Type II Diabetes Musculoskeletal Medical History: Positive for: Osteoarthritis Neurologic Medical History: Positive for: Neuropathy HBO Extended History Items Eyes: Cataracts Immunizations Pneumococcal Vaccine: Received Pneumococcal Vaccination: Yes Received Pneumococcal Vaccination On or After [address], BEATH [place] ([id number]) [id number] Page 8 of 9 Implantable Devices None Family and Social History Former smoker; Alcohol Use: Never; Drug Use: No History; Caffeine Use: Rarely Electronic Signature(s) Signed: 11/06/2022 4:39:06 PM By: Mackenzie Shan DO Signed: 11/08/2022 5:35:32 PM By: Mackenzie Loud MSN RN CNS WTA Entered By: [name] on 11/06/2022 14:15:28 -------------------------------------------------------------------------------- SuperBill Details Patient Name: Date of Service: [name], [name] 11/06/2022 Medical Record Number:  [id number] Patient Account Number: [id number] Date of Birth/Sex: Treating RN: 1932-07-04 (89 y.o. [name] Primary Care Provider: [name] Other Clinician: Referring Provider: Treating Provider/Extender: [name], [name] in Treatment: 4 Diagnosis Coding ICD-10 Codes Code Description [phone] Non-pressure chronic ulcer of other part of right lower leg with fat layer exposed I87.311 Chronic venous hypertension (idiopathic) with ulcer of right lower extremity I48.20 Chronic atrial fibrillation, unspecified Z79.01 Long term (current) use of anticoagulants Facility Procedures : CPT4 Code: [id number] Description: [place] - DEB SUBQ TISSUE 20 SQ CM/< ICD-10 Diagnosis Description [id number] Non-pressure chronic ulcer of other part of right lower leg with fat layer exp I87.311 Chronic venous hypertension (idiopathic) with ulcer of right lower extremity Modifier: osed Quantity: 1 : CPT4 Code: [id number] Description: [id number] - DEB SUBQ TISS EA ADDL 20CM ICD-10 Diagnosis Description L97.812 Non-pressure chronic ulcer of other part of right lower leg with fat layer exp I87.311 Chronic venous hypertension (idiopathic) with ulcer of right lower extremity Modifier: osed Quantity: 1 Physician Procedures : CPT4 Code Description Modifier [id number] - WC PHYS SUBQ TISS 20 SQ CM ICD-10 Diagnosis Description [id number] Non-pressure chronic ulcer of other part of right lower leg with fat layer exposed I87.311 Chronic venous hypertension (idiopathic) with  ulcer of right lower extremity Quantity: 1 Electronic Signature(s) Signed: 11/06/2022 4:39:06 PM By: Mackenzie Shan DO Entered By: [name] on 11/06/2022 14:14:59

## 2022-11-11 ENCOUNTER — Encounter (INDEPENDENT_AMBULATORY_CARE_PROVIDER_SITE_OTHER): Payer: PPO | Admitting: Vascular Surgery

## 2022-11-11 ENCOUNTER — Encounter (INDEPENDENT_AMBULATORY_CARE_PROVIDER_SITE_OTHER): Payer: PPO

## 2022-11-13 ENCOUNTER — Encounter (HOSPITAL_BASED_OUTPATIENT_CLINIC_OR_DEPARTMENT_OTHER): Payer: PPO | Admitting: Internal Medicine

## 2022-11-13 DIAGNOSIS — L97812 Non-pressure chronic ulcer of other part of right lower leg with fat layer exposed: Secondary | ICD-10-CM | POA: Diagnosis not present

## 2022-11-13 DIAGNOSIS — I87311 Chronic venous hypertension (idiopathic) with ulcer of right lower extremity: Secondary | ICD-10-CM | POA: Diagnosis not present

## 2022-11-14 NOTE — Progress Notes (Signed)
Mackenzie, [name] [place] ([id number]) [id number] Page 1 of 8 Visit Report for 11/13/2022 Arrival Information Details Patient Name: Date of Service: [name], [place] 11/13/2022 12:30 PM Medical Record Number: [id number] Patient Account Number: [id number] Date of Birth/Sex: Treating RN: [birth year] (89 y.o. [name] Primary Care [name]: [name] Other Clinician: Referring [name]: Treating [name]/Extender: [name], [name] in Treatment: 5 Visit Information History Since Last Visit Added or deleted any medications: No Patient Arrived: Cane Has Dressing in Place as Prescribed: Yes Arrival Time: 12:41 Has Compression in Place as Prescribed: Yes Accompanied By: self Pain Present Now: No Transfer Assistance: None Patient Identification Verified: Yes Secondary Verification Process Completed: Yes Patient Requires Transmission-Based Precautions: No Patient Has Alerts: Yes Patient Alerts: Patient on Blood Thinner Warfarin Electronic Signature(s) Signed: 11/13/2022 5:10:28 PM By: Mackenzie Loud MSN RN CNS WTA Entered By: [name] on 11/13/2022 12:42:16 -------------------------------------------------------------------------------- Clinic Level of Care Assessment Details Patient Name: Date of Service: [name], [name] 11/13/2022 12:30 PM Medical Record Number: [id number] Patient Account Number: [id number] Date of Birth/Sex: Treating RN: 1932/08/20 (89 y.o. [name] Primary Care [name]: [name] Other Clinician: Referring Sulamita Lafountain: Treating Travonna Swindle/Extender: [name], [name] in Treatment: 5 Clinic Level of Care Assessment Items TOOL 1 Quantity Score '[]'$  - 0 Use when EandM and Procedure is performed on INITIAL visit ASSESSMENTS - Nursing Assessment / Reassessment '[]'$  - 0 General Physical Exam (combine w/ comprehensive assessment (listed just below) when performed on new pt. evals) '[]'$  - 0 Comprehensive  Assessment (HX, ROS, Risk Assessments, Wounds Hx, etc.) ASSESSMENTS - Wound and Skin Assessment / Reassessment '[]'$  - 0 Dermatologic / Skin Assessment (not related to wound area) Dacus, [name] ([id number]) [id number] Page 2 of 8 ASSESSMENTS - Ostomy and/or Continence Assessment and Care '[]'$  - 0 Incontinence Assessment and Management '[]'$  - 0 Ostomy Care Assessment and Management (repouching, etc.) PROCESS - Coordination of Care '[]'$  - 0 Simple Patient / Family Education for ongoing care '[]'$  - 0 Complex (extensive) Patient / Family Education for ongoing care '[]'$  - 0 Staff obtains Programmer, systems, Records, T Results / Process Orders est '[]'$  - 0 Staff telephones HHA, Nursing Homes / Clarify orders / etc '[]'$  - 0 Routine Transfer to another Facility (non-emergent condition) '[]'$  - 0 Routine Hospital Admission (non-emergent condition) '[]'$  - 0 New Admissions / Biomedical engineer / Ordering NPWT Apligraf, etc. , '[]'$  - 0 Emergency Hospital Admission (emergent condition) PROCESS - Special Needs '[]'$  - 0 Pediatric / Minor Patient Management '[]'$  - 0 Isolation Patient Management '[]'$  - 0 Hearing / Language / Visual special needs '[]'$  - 0 Assessment of Community assistance (transportation, D/C planning, etc.) '[]'$  - 0 Additional assistance / Altered mentation '[]'$  - 0 Support Surface(s) Assessment (bed, cushion, seat, etc.) INTERVENTIONS - Miscellaneous '[]'$  - 0 External ear exam '[]'$  - 0 Patient Transfer (multiple staff / Civil Service fast streamer / Similar devices) '[]'$  - 0 Simple Staple / Suture removal (25 or less) '[]'$  - 0 Complex Staple / Suture removal (26 or more) '[]'$  - 0 Hypo/Hyperglycemic Management (do not check if billed separately) '[]'$  - 0 Ankle / Brachial Index (ABI) - do not check if billed separately Has the patient been seen at the hospital within the last three years: Yes Total Score: 0 Level Of Care: ____ Electronic Signature(s) Signed: 11/13/2022 5:10:28 PM By: Mackenzie Loud  MSN RN CNS WTA Entered By: [name] on 11/13/2022 13:08:40 -------------------------------------------------------------------------------- Compression Therapy Details Patient Name: Date of  Service: [name], [name] 11/13/2022 12:30 PM Medical Record Number: [id number] Patient Account Number: [id number] Date of Birth/Sex: Treating RN: 1932-05-07 (89 y.o. [name] Primary Care [name]: [name] Other Clinician: Referring [name]: Treating [name]/Extender: [name], [name] in Treatment: 5 Compression Therapy Performed for Wound Assessment: Wound #2 Right,Anterior Lower Leg Performed By: Clinician Mackenzie Loud, RN Compression Type: [address] [name], [name] ([id number]) [id number] Page 3 of 8 Post Procedure Diagnosis Same as Pre-procedure Electronic Signature(s) Signed: 11/13/2022 5:10:28 PM By: Mackenzie Loud MSN RN CNS WTA Entered By: [name] on 11/13/2022 13:07:55 -------------------------------------------------------------------------------- Encounter Discharge Information Details Patient Name: Date of Service: [name], [name] 11/13/2022 12:30 PM Medical Record Number: [id number] Patient Account Number: [id number] Date of Birth/Sex: Treating RN: Jan 07, 1932 (89 y.o. [name] Primary Care [name]: [name] Other Clinician: Referring [name]: Treating [name]/Extender: [name], [name] in Treatment: 5 Encounter Discharge Information Items Post Procedure Vitals Discharge Condition: Stable Temperature (F): 98.0 Ambulatory Status: Cane Pulse (bpm): 92 Discharge Destination: Home Respiratory Rate (breaths/min): 18 Transportation: Private Auto Blood Pressure (mmHg): 107/74 Accompanied By: self Schedule Follow-up Appointment: Yes Clinical Summary of Care: Electronic Signature(s) Signed: 11/13/2022 5:10:28 PM By: Mackenzie Loud MSN RN CNS WTA Entered By: [name] on 11/13/2022  13:11:50 -------------------------------------------------------------------------------- Lower Extremity Assessment Details Patient Name: Date of Service: [name], [name] 11/13/2022 12:30 PM Medical Record Number: [id number] Patient Account Number: [id number] Date of Birth/Sex: Treating RN: [birth year] (89 y.o. [name] Primary Care Nela Bascom: [name] Other Clinician: Referring Keath Matera: Treating Amadi Frady/Extender: [name], [name] in Treatment: 5 Edema Assessment Assessed: [Left: No] [Right: No] [Left: Edema] [Right: :] [address], [name] ([id number]) [id number] Page 4 of 8 Left: Right: Point of Measurement: 34 cm From Medial Instep 29.2 cm Ankle Left: Right: Point of Measurement: 12 cm From Medial Instep 18.2 cm Vascular Assessment Pulses: Dorsalis Pedis Palpable: [Right:Yes] Electronic Signature(s) Signed: 11/13/2022 5:10:28 PM By: Mackenzie Loud MSN RN CNS WTA Entered By: [name] on 11/13/2022 12:58:09 -------------------------------------------------------------------------------- Multi Wound Chart Details Patient Name: Date of Service: [name], [name] 11/13/2022 12:30 PM Medical Record Number: [id number] Patient Account Number: [id number] Date of Birth/Sex: Treating RN: 1931-12-02 (89 y.o. [name] Primary Care Kiko Ripp: [name] Other Clinician: Referring Leeam Cedrone: Treating Letti Towell/Extender: [name], [name] in Treatment: 5 Vital Signs Height(in): 68 Pulse(bpm): 92 Weight(lbs): 137 Blood Pressure(mmHg): 107/74 Body Mass Index(BMI): 20.8 Temperature(F): 98.0 Respiratory Rate(breaths/min): 16 [2:Photos:] [N/A:N/A] Right, Anterior Lower Leg N/A N/A Wound Location: Skin Tear/Laceration N/A N/A Wounding Event: Skin Tear N/A N/A Primary Etiology: Cataracts, Arrhythmia, Osteoarthritis, N/A N/A Comorbid History: Neuropathy 08/30/2022 N/A N/A Date Acquired: 5 N/A  N/A [name] of Treatment: Open N/A N/A Wound Status: No N/A N/A Wound Recurrence: 5.4x5.4x0.2 N/A N/A Measurements L x W x D (cm) 22.902 N/A N/A A (cm) : rea 4.58 N/A N/A Volume (cm) : 29.30% N/A N/A % Reduction in Area: -41.40% N/A N/A % Reduction in Volume: Full Thickness Without Exposed N/A N/A Classification: Support Structures Medium N/A N/A Exudate AmountHARJIT, [name] ([id number]) [id number] Page 5 of 8 Serosanguineous N/A N/A Exudate Type: red, brown N/A N/A Exudate Color: Small (1-33%) N/A N/A Granulation Amount: Red, Pink N/A N/A Granulation Quality: Large (67-100%) N/A N/A Necrotic Amount: Fat Layer (Subcutaneous Tissue): Yes N/A N/A Exposed Structures: Fascia: No Tendon: No Muscle: No Joint: No Bone: No Small (1-33%) N/A N/A Epithelialization: Treatment Notes  Electronic Signature(s) Signed: 11/13/2022 5:10:28 PM By: Mackenzie Loud MSN RN CNS WTA Entered By: [name] on 11/13/2022 13:01:04 -------------------------------------------------------------------------------- Pain Assessment Details Patient Name: Date of Service: [name], [name] 11/13/2022 12:30 PM Medical Record Number: [id number] Patient Account Number: [id number] Date of Birth/Sex: Treating RN: Aug 29, 1932 (89 y.o. [name] Primary Care Atleigh Gruen: [name] Other Clinician: Referring Alioune Hodgkin: Treating Sylena Lotter/Extender: [name], [name] in Treatment: 5 Active Problems Location of Pain Severity and Description of Pain Patient Has Paino No Site Locations Pain Management and Medication Current Pain Management: Electronic Signature(s) Signed: 11/13/2022 5:10:28 PM By: Mackenzie Loud MSN RN CNS WTA Entered By: [name] on 11/13/2022 12:44:44 [name], [name] ([id number] Page 6 of 8 -------------------------------------------------------------------------------- Patient/Caregiver  Education Details Patient Name: Date of Service: [name], [place] 2/28/2024andnbsp12:30 PM Medical Record Number: [id number] Patient Account Number: [id number] Date of Birth/Gender: Treating RN: 03-19-1932 (89 y.o. [name] Primary Care Physician: [name] Other Clinician: Referring Physician: Treating Physician/Extender: Weyman Croon [name] in Treatment: 5 Education Assessment Education Provided To: Patient Education Topics Provided Wound Debridement: Handouts: Wound Debridement Methods: Explain/Verbal Responses: State content correctly Electronic Signature(s) Signed: 11/13/2022 5:10:28 PM By: Mackenzie Loud MSN RN CNS WTA Entered By: [name] on 11/13/2022 13:09:40 -------------------------------------------------------------------------------- Wound Assessment Details Patient Name: Date of Service: [name], [name] 11/13/2022 12:30 PM Medical Record Number: [id number] Patient Account Number: [id number] Date of Birth/Sex: Treating RN: 1932/07/01 (89 y.o. [name] Primary Care Johnrobert Foti: [name] Other Clinician: Referring Beonca Gibb: Treating Issai Werling/Extender: [name], [name] in Treatment: 5 Wound Status Wound Number: 2 Primary Etiology: Skin Tear Wound Location: Right, Anterior Lower Leg Wound Status: Open Wounding Event: Skin Tear/Laceration Comorbid History: Cataracts, Arrhythmia, Osteoarthritis, Neuropathy Date Acquired: 08/30/2022 [name] Of Treatment: 5 Clustered Wound: No Photos [name], [name] [name] ([id number]) [id number] Page 7 of 8 Wound Measurements Length: (cm) 5.4 Width: (cm) 5.4 Depth: (cm) 0.2 Area: (cm) 22.902 Volume: (cm) 4.58 % Reduction in Area: 29.3% % Reduction in Volume: -41.4% Epithelialization: Small (1-33%) Wound Description Classification: Full Thickness Without Exposed Support Structures Exudate Amount: Medium Exudate Type: Serosanguineous Exudate Color:  red, brown Foul Odor After Cleansing: No Slough/Fibrino Yes Wound Bed Granulation Amount: Small (1-33%) Exposed Structure Granulation Quality: Red, Pink Fascia Exposed: No Necrotic Amount: Large (67-100%) Fat Layer (Subcutaneous Tissue) Exposed: Yes Necrotic Quality: Adherent Slough Tendon Exposed: No Muscle Exposed: No Joint Exposed: No Bone Exposed: No Treatment Notes Wound #2 (Lower Leg) Wound Laterality: Right, Anterior Cleanser Soap and Water Discharge Instruction: Gently cleanse wound with antibacterial soap, rinse and pat dry prior to dressing wounds Peri-Wound Care Topical Santyl Collagenase Ointment, 30 (gm), tube Discharge Instruction: apply nickel thick to wound bed only Primary Dressing Hydrofera Blue Ready Transfer Foam, 2.5x2.5 (in/in) Discharge Instruction: Apply Hydrofera Blue Ready to wound bed as directed Secondary Dressing ABD Pad 5x9 (in/in) Discharge Instruction: Cover with ABD pad Secured With Compression Wrap 3-LAYER WRAP - Profore Lite LF 3 Multilayer Compression Bandaging System Discharge Instruction: Apply 3 multi-layer wrap as prescribed. Compression Stockings Add-Ons Electronic Signature(s) Signed: 11/13/2022 5:10:28 PM By: Mackenzie Loud MSN RN CNS WTA Entered By: [name] on 11/13/2022 12:56:52 [name], [name] ([id number]) [id number] Page 8 of 8 -------------------------------------------------------------------------------- Vitals Details Patient Name: Date of Service: [name], [name] 11/13/2022 12:30 PM Medical Record Number: [id number] Patient Account Number: [id number] Date of Birth/Sex: Treating RN: 1932-08-07 (89 y.o. [name] Primary Care Sergey Ishler:  [name], Mackenzie Other Clinician: Referring Twilia Yaklin: Treating [name] Mundt/Extender: [name], [name] in Treatment: 5 Vital Signs Time Taken: 12:42 Temperature (F): 98.0 Height (in): 68 Pulse (bpm): 92 Weight (lbs): 137 Respiratory  Rate (breaths/min): 16 Body Mass Index (BMI): 20.8 Blood Pressure (mmHg): 107/74 Reference Range: 80 - 120 mg / dl Electronic Signature(s) Signed: 11/13/2022 5:10:28 PM By: Mackenzie Loud MSN RN CNS WTA Entered By: [name] on 11/13/2022 12:44:36

## 2022-11-14 NOTE — Progress Notes (Signed)
HARUKO, HERN [place] ([id number]) [id number] Page 1 of 9 Visit Report for 11/13/2022 Chief Complaint Document Details Patient Name: Date of Service: [name], [name] 11/13/2022 12:30 PM Medical Record Number: [id number] Patient Account Number: [id number] Date of Birth/Sex: Treating RN: December 02, 1931 (89 y.o. [name] Primary Care Provider: Eliezer [name] Other Clinician: Referring Provider: Treating Provider/Extender: [name]: 5 Information Obtained from: Patient Chief Complaint 10/09/2022; right lower extremity wound Electronic Signature(s) Signed: 11/13/2022 1:53:20 PM By: Mackenzie Shan DO Entered By: [name] on 11/13/2022 13:10:13 -------------------------------------------------------------------------------- Debridement Details Patient Name: Date of Service: [name], [name] 11/13/2022 12:30 PM Medical Record Number: [id number] Patient Account Number: [id number] Date of Birth/Sex: Treating RN: [birth year] (89 y.o. [name] Primary Care Provider: Eliezer [name] Other Clinician: Referring Provider: Treating Provider/Extender: [name]: 5 Debridement Performed for Assessment: Wound #2 Right,Anterior Lower Leg Performed By: Physician Mackenzie Shan, MD Debridement Type: Debridement Level of Consciousness (Pre-procedure): Awake and Alert Pre-procedure Verification/Time Out Yes - 13:05 Taken: Start Time: 13:05 Pain Control: Lidocaine 4% T opical Solution T Area Debrided (L x W): otal 5.4 (cm) x 5.4 (cm) = 29.16 (cm) Tissue and other material debrided: Viable, Non-Viable, Slough, Subcutaneous, Skin: Dermis , Skin: Epidermis, Slough Level: Skin/Subcutaneous Tissue Debridement Description: Excisional Instrument: Curette Bleeding: Minimum Hemostasis Achieved: Pressure Response to [name]: Procedure was tolerated well Level of Consciousness (Post- Awake  and Alert procedure): PERCIE, [place] ([id number]) [id number] Page 2 of 9 Post Debridement Measurements of Total Wound Length: (cm) 5.4 Width: (cm) 5.4 Depth: (cm) 0.3 Volume: (cm) 6.871 Character of Wound/Ulcer Post Debridement: Requires Further Debridement Post Procedure Diagnosis Same as Pre-procedure Electronic Signature(s) Signed: 11/13/2022 1:53:20 PM By: Mackenzie Shan DO Signed: 11/13/2022 5:10:28 PM By: Mackenzie Loud MSN RN CNS WTA Entered By: [name] on 11/13/2022 13:07:30 -------------------------------------------------------------------------------- HPI Details Patient Name: Date of Service: [name], [name] 11/13/2022 12:30 PM Medical Record Number: [id number] Patient Account Number: [id number] Date of Birth/Sex: Treating RN: 10-25-1931 (89 y.o. [name] Primary Care Provider: Eliezer [name] Other Clinician: Referring Provider: Treating Provider/Extender: [name]: 5 History of Present Illness HPI Description: 05/08/2021 upon evaluation today patient appears for initial inspection here in the clinic concerning issues that she has been having actually with a wound over the anterior portion of her right leg. She tells me that this was due to an injury where she struck this on something causing the initial trauma and she has been having trouble getting it healed. [name] over at [facility] has been helping take care of this. With that being said because it was not healing quite as quickly and effectively is what she wanted to see she did want [place] to take a look and make any recommendations to try to help things along. I am definitely happy to do that for sure. Fortunately there does not appear to be any signs of active infection at this time which is great news. The patient also does not seem to have too deep of the wound which is also good news. Her biggest concern is she really does not want to come  weekly to the clinic especially since she has care at the [facility] location as far as getting the dressing changed and keeping an eye on it. She does have a history of atrial fibrillation for which she is on warfarin and she does not like to do  water aerobics twice a week although she is not able to right now due to the fact that she has this wound and cannot get in the water. That is a significant issue for her at this point. Otherwise does have noncompressible pulses based on what we are seeing today. She wears compression socks for her chronic venous insufficiency she has had previous ablation therapy. 05/29/2021 upon evaluation today patient appears to be doing well with regard to her wound. Overall this is actually showing signs of good improvement with significant overall decrease in size since last week even. Overall I feel like that she is doing awesome and making excellent progress. Over at [facility] they been taking good care of her. 06/14/2021 upon evaluation today patient appears to be doing excellent in regard to her wound. In fact she is completely healed based on what we are seeing currently. I do not see any signs of active infection at this time which is great news. 10/05/2022 [name] is an 89 year old female with a past medical history of chronic venous insufficiency and atrial fibrillation on warfarin that presents the clinic for a 1-50-monthhistory of nonhealing ulcer to the anterior aspect of the right lower extremity. She states she hit her leg against an object in the beginning of December. She describes what started out as a hematoma and was debrided by a physician in her facility. She has been using Xeroform to the wound bed. She currently denies signs of infection. She has been wearing her compression stocking. 1/31; patient presents for follow-up. She has been using Hydrofera Blue and Santyl to the wound bed daily. There is been improvement in wound healing. She  is scheduled for ABIs with TBI's today. She has no issues or complaints today. She denies signs of infection. 2/7; patient presents for follow-up. She has been using Hydrofera Blue and Santyl to the wound bed. She had her ABIs with TBI's however the results have not been released. She has no issues or complaints today. 2/14; patient presents for follow-up. She has been using Hydrofera Blue and Santyl to the wound bed. Her ABI resulted and showed noncompressible on the right with a TBI of 0.88 with triphasic waveforms. 2/21; patient presents for follow-up. We have been using Hydrofera Blue and Santyl to the wound bed under 3 layer compression. She has no issues or complaints today. There is been improvement in wound healing. 2/28; patient presents for follow-up. We have been using Hydrofera Blue and Santyl to the wound bed under 3 layer compression to the right lower extremity. There continues to be improvement in wound healing. She has no issues or complaints today. HSUSSY, TSUJIJColeta([id number] Page 3 of 9 Electronic Signature(s) Signed: 11/13/2022 1:53:20 PM By: HKalman ShanDO Entered By: HKalman Shanon 11/13/2022 13:11:01 -------------------------------------------------------------------------------- Physical Exam Details Patient Name: Date of Service: [name], [place] 11/13/2022 12:30 PM Medical Record Number: [id number] Account Number: [id number] of Birth/Sex: Treating RN: 403/07/1932(89y.o. FDrema PryPrimary Care Provider: BEliezer LoftsOther Clinician: Referring Provider: Treating Provider/Extender: HArmandina [name] [name] in [name]: 5 Constitutional . Cardiovascular . Psychiatric . Notes Right lower extremity: T the anterior aspect there is an open wound with granulation tissue and nonviable tissue. Good edema control. No surrounding signs of o infection including increased warmth, erythema or  purulent drainage. Venous stasis dermatitis throughout the leg. Electronic Signature(s) Signed: 11/13/2022 1:53:20 PM By: HKalman ShanDO Entered By: HKalman [name]  on 11/13/2022 13:11:33 -------------------------------------------------------------------------------- Physician Orders Details Patient Name: Date of Service: [name], [name] 11/13/2022 12:30 PM Medical Record Number: [id number] Patient Account Number: [id number] Date of Birth/Sex: Treating RN: 1932/04/07 (89 y.o. [name] Primary Care Provider: Eliezer [name] Other Clinician: Referring Provider: Treating Provider/Extender: [name]: 5 Verbal / Phone Orders: No Diagnosis Coding Follow-up Appointments Return Appointment in 1 week. Bathing/ Shower/ [address] MARYAN, FEEHAN [place] ([id number]) [id number] Page 4 of 9 Wash wounds with antibacterial soap and water. May shower with wound dressing protected with water repellent cover or cast protector. No tub bath. Anesthetic (Use 'Patient Medications' Section for Anesthetic Order Entry) Lidocaine applied to wound bed Edema Control - Lymphedema / Segmental Compressive Device / Other Right Lower Extremity 3 Layer Compression System for Lymphedema. Elevate, Exercise Daily and A void Standing for Long Periods of Time. Elevate legs to the level of the heart and pump ankles as often as possible Wound [name] Wound #2 - Lower Leg Wound Laterality: Right, Anterior Cleanser: Soap and Water 3 x Per Week/30 Days Discharge Instructions: Gently cleanse wound with antibacterial soap, rinse and pat dry prior to dressing wounds Topical: Santyl Collagenase Ointment, 30 (gm), tube 3 x Per Week/30 Days Discharge Instructions: apply nickel thick to wound bed only Prim Dressing: Hydrofera Blue Ready Transfer Foam, 2.5x2.5 (in/in) 3 x Per Week/30 Days ary Discharge Instructions: Apply Hydrofera Blue Ready to wound bed as  directed Secondary Dressing: ABD Pad 5x9 (in/in) 3 x Per Week/30 Days Discharge Instructions: Cover with ABD pad Compression Wrap: 3-LAYER WRAP - Profore Lite LF 3 Multilayer Compression Bandaging System (Generic) 3 x Per Week/30 Days Discharge Instructions: Apply 3 multi-layer wrap as prescribed. Electronic Signature(s) Signed: 11/13/2022 1:53:20 PM By: Mackenzie Shan DO Entered By: [name] on 11/13/2022 13:12:47 -------------------------------------------------------------------------------- Problem List Details Patient Name: Date of Service: [name], [name] 11/13/2022 12:30 PM Medical Record Number: [id number] Patient Account Number: [id number] Date of Birth/Sex: Treating RN: Apr 16, 1932 (89 y.o. [name] Primary Care Provider: Eliezer [name] Other Clinician: Referring Provider: Treating Provider/Extender: [name]: 5 Active Problems ICD-10 Encounter Code Description Active Date MDM Diagnosis [phone] Non-pressure chronic ulcer of other part of right lower leg with fat layer 10/09/2022 No Yes exposed I87.311 Chronic venous hypertension (idiopathic) with ulcer of right lower extremity 10/09/2022 No Yes I48.20 Chronic atrial fibrillation, unspecified 10/09/2022 No Yes [name], [name] ([id number]) [id number] Page 5 of 9 Z79.01 Long term (current) use of anticoagulants 10/09/2022 No Yes Inactive Problems Resolved Problems Electronic Signature(s) Signed: 11/13/2022 1:53:20 PM By: Mackenzie Shan DO Entered By: [name] on 11/13/2022 13:10:10 -------------------------------------------------------------------------------- Progress Note Details Patient Name: Date of Service: [name], [name] 11/13/2022 12:30 PM Medical Record Number: [id number] Patient Account Number: [id number] Date of Birth/Sex: Treating RN: [birth year] (89 y.o. [name] Primary Care Provider: Eliezer [name] Other  Clinician: Referring Provider: Treating Provider/Extender: [name]: 5 Subjective Chief Complaint Information obtained from Patient 10/09/2022; right lower extremity wound History of Present Illness (HPI) 05/08/2021 upon evaluation today patient appears for initial inspection here in the clinic concerning issues that she has been having actually with a wound over the anterior portion of her right leg. She tells me that this was due to an injury where she struck this on something causing the initial trauma and she has been having trouble getting it healed. [name] over at [facility] has  been helping take care of this. With that being said because it was not healing quite as quickly and effectively is what she wanted to see she did want [place] to take a look and make any recommendations to try to help things along. I am definitely happy to do that for sure. Fortunately there does not appear to be any signs of active infection at this time which is great news. The patient also does not seem to have too deep of the wound which is also good news. Her biggest concern is she really does not want to come weekly to the clinic especially since she has care at the [facility] location as far as getting the dressing changed and keeping an eye on it. She does have a history of atrial fibrillation for which she is on warfarin and she does not like to do water aerobics twice a week although she is not able to right now due to the fact that she has this wound and cannot get in the water. That is a significant issue for her at this point. Otherwise does have noncompressible pulses based on what we are seeing today. She wears compression socks for her chronic venous insufficiency she has had previous ablation therapy. 05/29/2021 upon evaluation today patient appears to be doing well with regard to her wound. Overall this is actually showing signs of good improvement with significant  overall decrease in size since last week even. Overall I feel like that she is doing awesome and making excellent progress. Over at [facility] they been taking good care of her. 06/14/2021 upon evaluation today patient appears to be doing excellent in regard to her wound. In fact she is completely healed based on what we are seeing currently. I do not see any signs of active infection at this time which is great news. 10/05/2022 [name] is an 89 year old female with a past medical history of chronic venous insufficiency and atrial fibrillation on warfarin that presents the clinic for a 1-80-monthhistory of nonhealing ulcer to the anterior aspect of the right lower extremity. She states she hit her leg against an object in the beginning of December. She describes what started out as a hematoma and was debrided by a physician in her facility. She has been using Xeroform to the wound bed. She currently denies signs of infection. She has been wearing her compression stocking. 1/31; patient presents for follow-up. She has been using Hydrofera Blue and Santyl to the wound bed daily. There is been improvement in wound healing. She is scheduled for ABIs with TBI's today. She has no issues or complaints today. She denies signs of infection. 2/7; patient presents for follow-up. She has been using Hydrofera Blue and Santyl to the wound bed. She had her ABIs with TBI's however the results have not been released. She has no issues or complaints today. 2/14; patient presents for follow-up. She has been using Hydrofera Blue and Santyl to the wound bed. Her ABI resulted and showed noncompressible on the right with a TBI of 0.88 with triphasic waveforms. 2/21; patient presents for follow-up. We have been using Hydrofera Blue and Santyl to the wound bed under 3 layer compression. She has no issues or complaints today. There is been improvement in wound healing. 2/28; patient presents for follow-up. We have  been using Hydrofera Blue and Santyl to the wound bed under 3 layer compression to the right lower extremity. HBRONA, FINNERJCoppock([id number] Page 6 of 9  There continues to be improvement in wound healing. She has no issues or complaints today. Objective Constitutional Vitals Time Taken: 12:42 PM, Height: 68 in, Weight: 137 lbs, BMI: 20.8, Temperature: 98.0 F, Pulse: 92 bpm, Respiratory Rate: 16 breaths/min, Blood Pressure: 107/74 mmHg. General Notes: Right lower extremity: T the anterior aspect there is an open wound with granulation tissue and nonviable tissue. Good edema control. No o surrounding signs of infection including increased warmth, erythema or purulent drainage. Venous stasis dermatitis throughout the leg. Integumentary (Hair, Skin) Wound #2 status is Open. Original cause of wound was Skin T ear/Laceration. The date acquired was: 08/30/2022. The wound has been in [name] 5 [name]. The wound is located on the Right,Anterior Lower Leg. The wound measures 5.4cm length x 5.4cm width x 0.2cm depth; 22.902cm^2 area and 4.58cm^3 volume. There is Fat Layer (Subcutaneous Tissue) exposed. There is a medium amount of serosanguineous drainage noted. There is small (1-33%) red, pink granulation within the wound bed. There is a large (67-100%) amount of necrotic tissue within the wound bed including Adherent Slough. Assessment Active Problems ICD-10 Non-pressure chronic ulcer of other part of right lower leg with fat layer exposed Chronic venous hypertension (idiopathic) with ulcer of right lower extremity Chronic atrial fibrillation, unspecified Long term (current) use of anticoagulants Patient's wound has shown improvement in size in appearance since last clinic visit. I debrided nonviable tissue. I recommended continuing the course with Santyl and Hydrofera Blue under 3 layer compression to the right lower extremity. No signs of infection. Follow-up in  1 week. Procedures Wound #2 Pre-procedure diagnosis of Wound #2 is a Skin T located on the Right,Anterior Lower Leg . There was a Excisional Skin/Subcutaneous Tissue Debridement ear with a total area of 29.16 sq cm performed by Mackenzie Shan, MD. With the following instrument(s): Curette to remove Viable and Non-Viable tissue/material. Material removed includes Subcutaneous Tissue, Slough, Skin: Dermis, and Skin: Epidermis after achieving pain control using Lidocaine 4% T opical Solution. No specimens were taken. A time out was conducted at 13:05, prior to the start of the procedure. A Minimum amount of bleeding was controlled with Pressure. The procedure was tolerated well. Post Debridement Measurements: 5.4cm length x 5.4cm width x 0.3cm depth; 6.871cm^3 volume. Character of Wound/Ulcer Post Debridement requires further debridement. Post procedure Diagnosis Wound #2: Same as Pre-Procedure Pre-procedure diagnosis of Wound #2 is a Skin T located on the Right,Anterior Lower Leg . There was a Three Layer Compression Therapy Procedure by ear Mackenzie Loud, RN. Post procedure Diagnosis Wound #2: Same as Pre-Procedure Plan Follow-up Appointments: Return Appointment in 1 week. Bathing/ Shower/ Hygiene: Wash wounds with antibacterial soap and water. May shower with wound dressing protected with water repellent cover or cast protector. No tub bath. Anesthetic (Use 'Patient Medications' Section for Anesthetic Order Entry): Lidocaine applied to wound bed Edema Control - Lymphedema / Segmental Compressive Device / Other: 3 Layer Compression System for Lymphedema. Elevate, Exercise Daily and Avoid Standing for Long Periods of Time. Elevate legs to the level of the heart and pump ankles as often as possible WOUND #2: - Lower Leg Wound Laterality: Right, Anterior Cleanser: Soap and Water 3 x Per Week/30 Days Discharge Instructions: Gently cleanse wound with antibacterial soap, rinse and pat dry  prior to dressing wounds Sheley, [name] ([id number]) [id number] Page 7 of 9 Topical: Santyl Collagenase Ointment, 30 (gm), tube 3 x Per Week/30 Days Discharge Instructions: apply nickel thick to wound bed only Prim Dressing: Hydrofera Blue Ready Transfer Foam, 2.5x2.5 (in/in)  3 x Per Week/30 Days ary Discharge Instructions: Apply Hydrofera Blue Ready to wound bed as directed Secondary Dressing: ABD Pad 5x9 (in/in) 3 x Per Week/30 Days Discharge Instructions: Cover with ABD pad Com pression Wrap: 3-LAYER WRAP - Profore Lite LF 3 Multilayer Compression Bandaging System (Generic) 3 x Per Week/30 Days Discharge Instructions: Apply 3 multi-layer wrap as prescribed. 1. In office sharp debridement 2. Santyl and Hydrofera Blue under 3 layer compressionooright lower extremity 3. Follow-up in 1 week Electronic Signature(s) Signed: 11/13/2022 1:53:20 PM By: Mackenzie Shan DO Entered By: [name] on 11/13/2022 13:12:21 -------------------------------------------------------------------------------- ROS/PFSH Details Patient Name: Date of Service: [name], [name] 11/13/2022 12:30 PM Medical Record Number: [id number] Patient Account Number: [id number] Date of Birth/Sex: Treating RN: 1931/10/20 (89 y.o. [name] Primary Care Provider: Eliezer [name] Other Clinician: Referring Provider: Treating Provider/Extender: [name]: 5 Information Obtained From Patient Eyes Medical History: Positive for: Cataracts Cardiovascular Medical History: Positive for: Arrhythmia - Afib Endocrine Medical History: Negative for: Type I Diabetes; Type II Diabetes Musculoskeletal Medical History: Positive for: Osteoarthritis Neurologic Medical History: Positive for: Neuropathy HBO Extended History Items Eyes: Cataracts Immunizations LARISHA, FERRING ([id number]) [id number] Page 8 of 9 Pneumococcal  Vaccine: Received Pneumococcal Vaccination: Yes Received Pneumococcal Vaccination On or After 60th Birthday: Yes Implantable Devices None Family and Social History Former smoker; Alcohol Use: Never; Drug Use: No History; Caffeine Use: Rarely Electronic Signature(s) Signed: 11/13/2022 1:53:20 PM By: Mackenzie Shan DO Signed: 11/13/2022 5:10:28 PM By: Mackenzie Loud MSN RN CNS WTA Entered By: [name] on 11/13/2022 13:12:57 -------------------------------------------------------------------------------- SuperBill Details Patient Name: Date of Service: [name], [name] 11/13/2022 Medical Record Number: [id number] Patient Account Number: [id number] Date of Birth/Sex: Treating RN: 08/29/1932 (89 y.o. [name] Primary Care Provider: Eliezer [name] Other Clinician: Referring Provider: Treating Provider/Extender: [name]: 5 Diagnosis Coding ICD-10 Codes Code Description [phone] Non-pressure chronic ulcer of other part of right lower leg with fat layer exposed I87.311 Chronic venous hypertension (idiopathic) with ulcer of right lower extremity I48.20 Chronic atrial fibrillation, unspecified Z79.01 Long term (current) use of anticoagulants Facility Procedures : CPT4 Code: [id number] Description: 11042 - DEB SUBQ TISSUE 20 SQ CM/< ICD-10 Diagnosis Description L97.812 Non-pressure chronic ulcer of other part of right lower leg with fat layer exp I87.311 Chronic venous hypertension (idiopathic) with ulcer of right lower extremity Modifier: osed Quantity: 1 : CPT4 Code: [id number] Description: [id number] - DEB SUBQ TISS EA ADDL 20CM ICD-10 Diagnosis Description L97.812 Non-pressure chronic ulcer of other part of right lower leg with fat layer exp I87.311 Chronic venous hypertension (idiopathic) with ulcer of right lower extremity Modifier: osed Quantity: 1 Physician Procedures : CPT4 Code Description Modifier [id number] - WC PHYS SUBQ TISS 20 SQ  CM ICD-10 Diagnosis Description L97.812 Non-pressure chronic ulcer of other part of right lower leg with fat layer exposed I87.311 Chronic venous hypertension (idiopathic) with  ulcer of right lower extremity Quantity: 1 : [id number] - WC PHYS SUBQ TISS EA ADDL 20 CM ICD-10 Diagnosis Description [id number] Non-pressure chronic ulcer of other part of right lower leg with fat layer exposed I87.311 Chronic venous hypertension (idiopathic) with ulcer of right lower extremity  Dowers, [name] ([id number]) [phone].pdf Quantity: 1 Page 9 of 9 Electronic Signature(s) Signed: 11/13/2022 1:53:20 PM By: Mackenzie Shan DO Entered By: [name] on 11/13/2022 13:12:39

## 2022-11-20 ENCOUNTER — Encounter: Payer: PPO | Attending: Internal Medicine | Admitting: Internal Medicine

## 2022-11-20 DIAGNOSIS — M1990 Unspecified osteoarthritis, unspecified site: Secondary | ICD-10-CM | POA: Insufficient documentation

## 2022-11-20 DIAGNOSIS — Z7901 Long term (current) use of anticoagulants: Secondary | ICD-10-CM | POA: Insufficient documentation

## 2022-11-20 DIAGNOSIS — I482 Chronic atrial fibrillation, unspecified: Secondary | ICD-10-CM | POA: Diagnosis not present

## 2022-11-20 DIAGNOSIS — I87311 Chronic venous hypertension (idiopathic) with ulcer of right lower extremity: Secondary | ICD-10-CM | POA: Diagnosis not present

## 2022-11-20 DIAGNOSIS — L97812 Non-pressure chronic ulcer of other part of right lower leg with fat layer exposed: Secondary | ICD-10-CM | POA: Diagnosis not present

## 2022-11-20 DIAGNOSIS — I872 Venous insufficiency (chronic) (peripheral): Secondary | ICD-10-CM | POA: Insufficient documentation

## 2022-11-22 NOTE — Progress Notes (Signed)
[name] ([id number]) [id number] Page 1 of 9 Visit Report for 11/20/2022 Arrival Information Details Patient Name: Date of Service: [name], [place] 11/20/2022 12:30 PM Medical Record Number: [id number] Patient Account Number: [id number] Date of Birth/Sex: Treating RN: 02-27-1932 (89 y.o. [name] Primary Care [name]: [name] Other Clinician: Referring [name]: Treating [name]/Extender: [name], [name] in Treatment: 6 Visit Information History Since Last Visit Added or deleted any medications: No Patient Arrived: Cane Has Dressing in Place as Prescribed: Yes Arrival Time: 12:36 Has Compression in Place as Prescribed: Yes Accompanied By: self Pain Present Now: No Transfer Assistance: None Patient Identification Verified: Yes Secondary Verification Process Completed: Yes Patient Requires Transmission-Based Precautions: No Patient Has Alerts: Yes Patient Alerts: Patient on Blood Thinner Warfarin Electronic Signature(s) Signed: 11/20/2022 4:47:39 PM By: Mackenzie Loud MSN RN CNS WTA Entered By: [name] on 11/20/2022 12:45:40 -------------------------------------------------------------------------------- Clinic Level of Care Assessment Details Patient Name: Date of Service: [name], [name] 11/20/2022 12:30 PM Medical Record Number: [id number] Patient Account Number: [id number] Date of Birth/Sex: Treating RN: [birth year] (89 y.o. [name] Primary Care [name]: [name] Other Clinician: Referring [name]: Treating [name]/Extender: [name], [name] in Treatment: 6 Clinic Level of Care Assessment Items TOOL 1 Quantity Score '[]'$  - 0 Use when EandM and Procedure is performed on INITIAL visit ASSESSMENTS - Nursing Assessment / Reassessment '[]'$  - 0 General Physical Exam (combine w/ comprehensive assessment (listed just below) when performed on new pt. evals) '[]'$  - 0 Comprehensive  Assessment (HX, ROS, Risk Assessments, Wounds Hx, etc.) ASSESSMENTS - Wound and Skin Assessment / Reassessment '[]'$  - 0 Dermatologic / Skin Assessment (not related to wound area) [name] ([id number]) [id number] Page 2 of 9 ASSESSMENTS - Ostomy and/or Continence Assessment and Care '[]'$  - 0 Incontinence Assessment and Management '[]'$  - 0 Ostomy Care Assessment and Management (repouching, etc.) PROCESS - Coordination of Care '[]'$  - 0 Simple Patient / Family Education for ongoing care '[]'$  - 0 Complex (extensive) Patient / Family Education for ongoing care '[]'$  - 0 Staff obtains Programmer, systems, Records, T Results / Process Orders est '[]'$  - 0 Staff telephones HHA, Nursing Homes / Clarify orders / etc '[]'$  - 0 Routine Transfer to another Facility (non-emergent condition) '[]'$  - 0 Routine Hospital Admission (non-emergent condition) '[]'$  - 0 New Admissions / Biomedical engineer / Ordering NPWT Apligraf, etc. , '[]'$  - 0 Emergency Hospital Admission (emergent condition) PROCESS - Special Needs '[]'$  - 0 Pediatric / Minor Patient Management '[]'$  - 0 Isolation Patient Management '[]'$  - 0 Hearing / Language / Visual special needs '[]'$  - 0 Assessment of Community assistance (transportation, D/C planning, etc.) '[]'$  - 0 Additional assistance / Altered mentation '[]'$  - 0 Support Surface(s) Assessment (bed, cushion, seat, etc.) INTERVENTIONS - Miscellaneous '[]'$  - 0 External ear exam '[]'$  - 0 Patient Transfer (multiple staff / Civil Service fast streamer / Similar devices) '[]'$  - 0 Simple Staple / Suture removal (25 or less) '[]'$  - 0 Complex Staple / Suture removal (26 or more) '[]'$  - 0 Hypo/Hyperglycemic Management (do not check if billed separately) '[]'$  - 0 Ankle / Brachial Index (ABI) - do not check if billed separately Has the patient been seen at the hospital within the last three years: Yes Total Score: 0 Level Of Care: ____ Electronic Signature(s) Signed: 11/20/2022 4:47:39 PM By: Mackenzie Loud MSN  RN CNS WTA Entered By: [name] on 11/20/2022 16:29:45 -------------------------------------------------------------------------------- Compression Therapy Details Patient Name: Date of  Service: [name] 11/20/2022 12:30 PM Medical Record Number: [id number] Patient Account Number: [id number] Date of Birth/Sex: Treating RN: 1932/08/23 (89 y.o. [name] Primary Care [name]: [name] Other Clinician: Referring [name]: Treating [name]/Extender: [name], [name] in Treatment: 6 Compression Therapy Performed for Wound Assessment: Wound #2 Right,Anterior Lower Leg Performed By: Clinician Mackenzie Loud, RN Compression Type: [address] [name] ([id number]) [id number] Page 3 of 9 Post Procedure Diagnosis Same as Pre-procedure Electronic Signature(s) Signed: 11/20/2022 4:47:39 PM By: Mackenzie Loud MSN RN CNS WTA Entered By: [name] on 11/20/2022 13:08:17 -------------------------------------------------------------------------------- Encounter Discharge Information Details Patient Name: Date of Service: [name], [name] [place] 11/20/2022 12:30 PM Medical Record Number: [id number] Patient Account Number: [id number] Date of Birth/Sex: Treating RN: Mar 30, 1932 (89 y.o. [name] Primary Care [name]: [name] Other Clinician: Referring [name]: Treating [name]/Extender: [name], [name] in Treatment: 6 Encounter Discharge Information Items Post Procedure Vitals Discharge Condition: Stable Temperature (F): 97.9 Ambulatory Status: Cane Pulse (bpm): 73 Discharge Destination: Home Respiratory Rate (breaths/min): 16 Transportation: Private Auto Blood Pressure (mmHg): 103/68 Accompanied By: self Schedule Follow-up Appointment: Yes Clinical Summary of Care: Electronic Signature(s) Signed: 11/20/2022 4:30:22 PM By: Mackenzie Loud MSN RN CNS WTA Entered By: [name] on 11/20/2022  16:30:22 -------------------------------------------------------------------------------- Lower Extremity Assessment Details Patient Name: Date of Service: [name], [name] 11/20/2022 12:30 PM Medical Record Number: [id number] Patient Account Number: [id number] Date of Birth/Sex: Treating RN: May 04, 1932 (89 y.o. [name] Primary Care Ennio Houp: [name] Other Clinician: Referring Advika Mclelland: Treating Jorell Agne/Extender: [name], [name] in Treatment: 6 Edema Assessment Assessed: [Left: No] [Right: No] [Left: Edema] [Right: :] [address] Karle [name] ([id number]) [id number] Page 4 of 9 Left: Right: Point of Measurement: 34 cm From Medial Instep 29 cm Ankle Left: Right: Point of Measurement: 12 cm From Medial Instep 18 cm Vascular Assessment Pulses: Dorsalis Pedis Palpable: [Right:Yes] Electronic Signature(s) Signed: 11/20/2022 4:28:24 PM By: Mackenzie Loud MSN RN CNS WTA Entered By: [name] on 11/20/2022 16:28:24 -------------------------------------------------------------------------------- Multi Wound Chart Details Patient Name: Date of Service: [name], Mackenzie RY [name] [place] 11/20/2022 12:30 PM Medical Record Number: [id number] Patient Account Number: [id number] Date of Birth/Sex: Treating RN: 1931-10-22 (89 y.o. [name] Primary Care Trystyn Sitts: [name] Other Clinician: Referring Endi Lagman: Treating Paysley Poplar/Extender: [name], [name] in Treatment: 6 Vital Signs Height(in): 68 Pulse(bpm): 73 Weight(lbs): 137 Blood Pressure(mmHg): 103/68 Body Mass Index(BMI): 20.8 Temperature(F): 97.9 Respiratory Rate(breaths/min): 16 [2:Photos:] [N/A:N/A] Right, Anterior Lower Leg N/A N/A Wound Location: Skin Tear/Laceration N/A N/A Wounding Event: Skin Tear N/A N/A Primary Etiology: Cataracts, Arrhythmia, Osteoarthritis, N/A N/A Comorbid History: Neuropathy 08/30/2022 N/A N/A Date Acquired: 6 N/A N/A [name]  of Treatment: Open N/A N/A Wound Status: No N/A N/A Wound Recurrence: 5x4.8x0.1 N/A N/A Measurements L x W x D (cm) 18.85 N/A N/A A (cm) : rea 1.885 N/A N/A Volume (cm) : 41.80% N/A N/A % Reduction in Area: 41.80% N/A N/A % Reduction in Volume: Full Thickness Without Exposed N/A N/A Classification: Support Structures Medium N/A N/A Exudate AmountKIYLAH, JAYME ([id number]) [id number] Page 5 of 9 Serosanguineous N/A N/A Exudate Type: red, brown N/A N/A Exudate Color: Small (1-33%) N/A N/A Granulation Amount: Red, Pink N/A N/A Granulation Quality: Large (67-100%) N/A N/A Necrotic Amount: Fat Layer (Subcutaneous Tissue): Yes N/A N/A Exposed Structures: Fascia: No Tendon: No Muscle: No Joint: No Bone: No Small (1-33%) N/A N/A Epithelialization: Debridement -  Excisional N/A N/A Debridement: Pre-procedure Verification/Time Out 13:06 N/A N/A Taken: Lidocaine 4% Topical Solution N/A N/A Pain Control: Subcutaneous, Slough N/A N/A Tissue Debrided: Skin/Subcutaneous Tissue N/A N/A Level: 24 N/A N/A Debridement A (sq cm): rea Curette N/A N/A Instrument: Minimum N/A N/A Bleeding: Pressure N/A N/A Hemostasis A chieved: Procedure was tolerated well N/A N/A Debridement Treatment Response: 5x4.8x0.2 N/A N/A Post Debridement Measurements L x W x D (cm) 3.77 N/A N/A Post Debridement Volume: (cm) Compression Therapy N/A N/A Procedures Performed: Debridement Treatment Notes Wound #2 (Lower Leg) Wound Laterality: Right, Anterior Cleanser Soap and Water Discharge Instruction: Gently cleanse wound with antibacterial soap, rinse and pat dry prior to dressing wounds Peri-Wound Care Topical Santyl Collagenase Ointment, 30 (gm), tube Discharge Instruction: apply nickel thick to wound bed only Primary Dressing Hydrofera Blue Ready Transfer Foam, 2.5x2.5 (in/in) Discharge Instruction: Apply Hydrofera Blue Ready to wound bed as  directed Secondary Dressing ABD Pad 5x9 (in/in) Discharge Instruction: Cover with ABD pad Secured With Compression Wrap 3-LAYER WRAP - Profore Lite LF 3 Multilayer Compression Bandaging System Discharge Instruction: Apply 3 multi-layer wrap as prescribed. Compression Stockings Add-Ons Electronic Signature(s) Signed: 11/20/2022 4:28:30 PM By: Mackenzie Loud MSN RN CNS WTA Entered By: [name] on 11/20/2022 16:28:30 [place], Karle [name] ([id number]) [id number] Page 6 of 9 -------------------------------------------------------------------------------- Pain Assessment Details Patient Name: Date of Service: [name], [name] 11/20/2022 12:30 PM Medical Record Number: [id number] Patient Account Number: [id number] Date of Birth/Sex: Treating RN: Sep 25, 1931 (89 y.o. [name] Primary Care Trentyn Boisclair: [name] Other Clinician: Referring Vaniah Chambers: Treating [name]/Extender: [name], [name] in Treatment: 6 Active Problems Location of Pain Severity and Description of Pain Patient Has Paino No Site Locations Pain Management and Medication Current Pain Management: Electronic Signature(s) Signed: 11/20/2022 4:47:39 PM By: Mackenzie Loud MSN RN CNS WTA Entered By: [name] on 11/20/2022 12:51:18 -------------------------------------------------------------------------------- Patient/Caregiver Education Details Patient Name: Date of Service: [name], [name] [place] 3/6/2024andnbsp12:30 PM Medical Record Number: [id number] Patient Account Number: [id number] Date of Birth/Gender: Treating RN: 1932-01-01 (89 y.o. [name] Primary Care Physician: [name] Other Clinician: Referring Physician: Treating Physician/Extender: [name], [name] in Treatment: [address] Karle [name] ([id number]) [id number] Page 7 of 9 Education Assessment Education Provided To: Patient Education Topics Provided Wound/Skin  Impairment: Handouts: Caring for Your Ulcer Methods: Explain/Verbal Responses: State content correctly Electronic Signature(s) Signed: 11/20/2022 4:47:39 PM By: Mackenzie Loud MSN RN CNS WTA Entered By: [name] on 11/20/2022 13:12:55 -------------------------------------------------------------------------------- Wound Assessment Details Patient Name: Date of Service: [name], Mackenzie RY [name] [place] 11/20/2022 12:30 PM Medical Record Number: [id number] Patient Account Number: [id number] Date of Birth/Sex: Treating RN: 1932/03/02 (89 y.o. [name] Primary Care Denym Rahimi: [name] Other Clinician: Referring [name]: Treating [name]/Extender: [name], [name] in Treatment: 6 Wound Status Wound Number: 2 Primary Etiology: Skin Tear Wound Location: Right, Anterior Lower Leg Wound Status: Open Wounding Event: Skin Tear/Laceration Comorbid History: Cataracts, Arrhythmia, Osteoarthritis, Neuropathy Date Acquired: 08/30/2022 [name] Of Treatment: 6 Clustered Wound: No Photos Wound Measurements Length: (cm) 5 Width: (cm) 4.8 Depth: (cm) 0.1 Area: (cm) 18.85 Volume: (cm) 1.885 % Reduction in Area: 41.8% % Reduction in Volume: 41.8% Epithelialization: Small (1-33%) Wound Description Classification: Full Thickness Without Exposed Support Exudate Amount: Medium Exudate Type: Serosanguineous Lisenby, [name] ([id number]) Exudate Color: red, brown Structures Foul Odor After Cleansing: No Slough/Fibrino Yes [phone].pdf Page 8 of 9 Wound Bed Granulation Amount: Small (1-33%) Exposed Structure Granulation Quality: Red,  Pink Fascia Exposed: No Necrotic Amount: Large (67-100%) Fat Layer (Subcutaneous Tissue) Exposed: Yes Necrotic Quality: Adherent Slough Tendon Exposed: No Muscle Exposed: No Joint Exposed: No Bone Exposed: No Treatment Notes Wound #2 (Lower Leg) Wound Laterality: Right, Anterior Cleanser Soap and Water Discharge  Instruction: Gently cleanse wound with antibacterial soap, rinse and pat dry prior to dressing wounds Peri-Wound Care Topical Santyl Collagenase Ointment, 30 (gm), tube Discharge Instruction: apply nickel thick to wound bed only Primary Dressing Hydrofera Blue Ready Transfer Foam, 2.5x2.5 (in/in) Discharge Instruction: Apply Hydrofera Blue Ready to wound bed as directed Secondary Dressing ABD Pad 5x9 (in/in) Discharge Instruction: Cover with ABD pad Secured With Compression Wrap 3-LAYER WRAP - Profore Lite LF 3 Multilayer Compression Bandaging System Discharge Instruction: Apply 3 multi-layer wrap as prescribed. Compression Stockings Add-Ons Electronic Signature(s) Signed: 11/20/2022 4:47:39 PM By: Mackenzie Loud MSN RN CNS WTA Entered By: [name] on 11/20/2022 12:52:37 -------------------------------------------------------------------------------- Vitals Details Patient Name: Date of Service: [name], Mackenzie RY [name] [place] 11/20/2022 12:30 PM Medical Record Number: [id number] Patient Account Number: [id number] Date of Birth/Sex: Treating RN: August 29, 1932 (89 y.o. [name] Primary Care Betzaida Cremeens: [name] Other Clinician: Referring Amaree Loisel: Treating Eshal Propps/Extender: [name], [name] in Treatment: 6 Vital Signs Time Taken: 12:45 Temperature (F): 97.9 Height (in): 68 Pulse (bpm): 73 Weight (lbs): 137 Respiratory Rate (breaths/min): 16 Body Mass Index (BMI): 20.8 Blood Pressure (mmHg): 103/68 LEMA, STIDHAM ([id number]) [id number] Page 9 of 9 Reference Range: 80 - 120 mg / dl Electronic Signature(s) Signed: 11/20/2022 4:47:39 PM By: Mackenzie Loud MSN RN CNS WTA Entered By: [name] on 11/20/2022 12:46:24

## 2022-11-25 NOTE — Progress Notes (Signed)
SKYYE, GABA [place] ([id number]) [id number] Page 1 of 8 Visit Report for 11/20/2022 Chief Complaint Document Details Patient Name: Date of Service: [name], [name] 11/20/2022 12:30 PM Medical Record Number: [id number] Patient Account Number: [id number] Date of Birth/Sex: Treating RN: 09/22/1931 (89 y.o. [name] Primary Care Provider: [name] Other Clinician: Referring Provider: Treating Provider/Extender: [name], [name] in Treatment: 6 Information Obtained from: Patient Chief Complaint 10/09/2022; right lower extremity wound Electronic Signature(s) Signed: 11/20/2022 1:18:38 PM By: Kalman Shan DO Entered By: Kalman Shan on 11/20/2022 13:10:06 -------------------------------------------------------------------------------- Debridement Details Patient Name: Date of Service: Mackenzie FF, MA RY JA [name] 11/20/2022 12:30 PM Medical Record Number: [id number] Patient Account Number: [id number] Date of Birth/Sex: Treating RN: 1932-07-18 (89 y.o. [name] Primary Care Provider: [name] Other Clinician: Referring Provider: Treating Provider/Extender: [name], [name] in Treatment: 6 Debridement Performed for Assessment: Wound #2 Right,Anterior Lower Leg Performed By: Physician Kalman Shan, MD Debridement Type: Debridement Level of Consciousness (Pre-procedure): Awake and Alert Pre-procedure Verification/Time Out Yes - 13:06 Taken: Start Time: 13:06 Pain Control: Lidocaine 4% T opical Solution T Area Debrided (L x W): otal 5 (cm) x 4.8 (cm) = 24 (cm) Tissue and other material debrided: Viable, Non-Viable, Slough, Subcutaneous, Slough Level: Skin/Subcutaneous Tissue Debridement Description: Excisional Instrument: Curette Bleeding: Minimum Hemostasis Achieved: Pressure Response to Treatment: Procedure was tolerated well Level of Consciousness (Post- Awake and Alert procedure): [name], [name]  ([id number]) [id number] Page 2 of 8 Post Debridement Measurements of Total Wound Length: (cm) 5 Width: (cm) 4.8 Depth: (cm) 0.2 Volume: (cm) 3.77 Character of Wound/Ulcer Post Debridement: Requires Further Debridement Post Procedure Diagnosis Same as Pre-procedure Electronic Signature(s) Signed: 11/20/2022 1:18:38 PM By: Kalman Shan DO Signed: 11/20/2022 4:47:39 PM By: Rosalio Loud MSN RN CNS WTA Entered By: Rosalio Loud on 11/20/2022 13:07:24 -------------------------------------------------------------------------------- HPI Details Patient Name: Date of Service: Mackenzie FF, MA RY JA [name] 11/20/2022 12:30 PM Medical Record Number: [id number] Patient Account Number: [id number] Date of Birth/Sex: Treating RN: 1932-03-23 (88 y.o. [name] Primary Care Provider: [name] Other Clinician: Referring Provider: Treating Provider/Extender: [name], [name] in Treatment: 6 History of Present Illness HPI Description: 05/08/2021 upon evaluation today patient appears for initial inspection here in the clinic concerning issues that she has been having actually with a wound over the anterior portion of her right leg. She tells me that this was due to an injury where she struck this on something causing the initial trauma and she has been having trouble getting it healed. [name] over at [facility] has been helping take care of this. With that being said because it was not healing quite as quickly and effectively is what she wanted to see she did want [place] to take a look and make any recommendations to try to help things along. I am definitely happy to do that for sure. Fortunately there does not appear to be any signs of active infection at this time which is great news. The patient also does not seem to have too deep of the wound which is also good news. Her biggest concern is she really does not want to come weekly to the clinic especially since she  has care at the [facility] location as far as getting the dressing changed and keeping an eye on it. She does have a history of atrial fibrillation for which she is on warfarin and she does not like to do water aerobics twice a week  although she is not able to right now due to the fact that she has this wound and cannot get in the water. That is a significant issue for her at this point. Otherwise does have noncompressible pulses based on what we are seeing today. She wears compression socks for her chronic venous insufficiency she has had previous ablation therapy. 05/29/2021 upon evaluation today patient appears to be doing well with regard to her wound. Overall this is actually showing signs of good improvement with significant overall decrease in size since last week even. Overall I feel like that she is doing awesome and making excellent progress. Over at [facility] they been taking good care of her. 06/14/2021 upon evaluation today patient appears to be doing excellent in regard to her wound. In fact she is completely healed based on what we are seeing currently. I do not see any signs of active infection at this time which is great news. 10/05/2022 [name] is an 89 year old female with a past medical history of chronic venous insufficiency and atrial fibrillation on warfarin that presents the clinic for a 1-57-monthhistory of nonhealing ulcer to the anterior aspect of the right lower extremity. She states she hit her leg against an object in the beginning of December. She describes what started out as a hematoma and was debrided by a physician in her facility. She has been using Xeroform to the wound bed. She currently denies signs of infection. She has been wearing her compression stocking. 1/31; patient presents for follow-up. She has been using Hydrofera Blue and Santyl to the wound bed daily. There is been improvement in wound healing. She is scheduled for ABIs with TBI's today. She  has no issues or complaints today. She denies signs of infection. 2/7; patient presents for follow-up. She has been using Hydrofera Blue and Santyl to the wound bed. She had her ABIs with TBI's however the results have not been released. She has no issues or complaints today. 2/14; patient presents for follow-up. She has been using Hydrofera Blue and Santyl to the wound bed. Her ABI resulted and showed noncompressible on the right with a TBI of 0.88 with triphasic waveforms. 2/21; patient presents for follow-up. We have been using Hydrofera Blue and Santyl to the wound bed under 3 layer compression. She has no issues or complaints today. There is been improvement in wound healing. 2/28; patient presents for follow-up. We have been using Hydrofera Blue and Santyl to the wound bed under 3 layer compression to the right lower extremity. There continues to be improvement in wound healing. She has no issues or complaints today. [place], [place]([id number] Page 3 of 8 3/6; patient presents for follow-up. We have been using Hydrofera Blue and Santyl to the wound bed under 3 layer compression to the right lower extremity. She has no issues or complaints today. Electronic Signature(s) Signed: 11/20/2022 1:18:38 PM By: HKalman ShanDO Entered By: HKalman Shanon 11/20/2022 13:10:31 -------------------------------------------------------------------------------- Physical Exam Details Patient Name: Date of Service: Mackenzie FF, MA RY JA [name] 11/20/2022 12:30 PM Medical Record Number: [id number] Account Number: [id number] of Birth/Sex: Treating RN: 4July 19, 1933(89y.o. FDrema PryPrimary Care Provider: BEliezer LoftsOther Clinician: Referring Provider: Treating Provider/Extender: HArmandina Stammer [name] in Treatment: 6 Constitutional . Cardiovascular . Psychiatric . Notes Right lower extremity: T the anterior aspect there is an open  wound with granulation tissue and nonviable tissue. Good edema control. No surrounding signs of o  infection including increased warmth, erythema or purulent drainage. Venous stasis dermatitis throughout the leg. Electronic Signature(s) Signed: 11/20/2022 1:18:38 PM By: Kalman Shan DO Entered By: Kalman Shan on 11/20/2022 13:10:54 -------------------------------------------------------------------------------- Physician Orders Details Patient Name: Date of Service: Mackenzie FF, MA RY JA [name] 11/20/2022 12:30 PM Medical Record Number: [id number] Patient Account Number: [id number] Date of Birth/Sex: Treating RN: 1931-11-06 (89 y.o. [name] Primary Care Provider: [name] Other Clinician: Referring Provider: Treating Provider/Extender: [name], [name] in Treatment: 6 Verbal / Phone Orders: No Diagnosis Coding ICD-10 Coding [name], [name] ([id number]) [id number] Page 4 of 8 Code Description [phone] Non-pressure chronic ulcer of other part of right lower leg with fat layer exposed I87.311 Chronic venous hypertension (idiopathic) with ulcer of right lower extremity I48.20 Chronic atrial fibrillation, unspecified Z79.01 Long term (current) use of anticoagulants Follow-up Appointments Return Appointment in 1 week. Bathing/ [employer] wounds with antibacterial soap and water. May shower with wound dressing protected with water repellent cover or cast protector. No tub bath. Anesthetic (Use 'Patient Medications' Section for Anesthetic Order Entry) Lidocaine applied to wound bed Edema Control - Lymphedema / Segmental Compressive Device / Other Right Lower Extremity 3 Layer Compression System for Lymphedema. Elevate, Exercise Daily and A void Standing for Long Periods of Time. Elevate legs to the level of the heart and pump ankles as often as possible Wound Treatment Wound #2 - Lower Leg Wound Laterality: Right,  Anterior Cleanser: Soap and Water 3 x Per Week/30 Days Discharge Instructions: Gently cleanse wound with antibacterial soap, rinse and pat dry prior to dressing wounds Topical: Santyl Collagenase Ointment, 30 (gm), tube 3 x Per Week/30 Days Discharge Instructions: apply nickel thick to wound bed only Prim Dressing: Hydrofera Blue Ready Transfer Foam, 2.5x2.5 (in/in) 3 x Per Week/30 Days ary Discharge Instructions: Apply Hydrofera Blue Ready to wound bed as directed Secondary Dressing: ABD Pad 5x9 (in/in) 3 x Per Week/30 Days Discharge Instructions: Cover with ABD pad Compression Wrap: 3-LAYER WRAP - Profore Lite LF 3 Multilayer Compression Bandaging System (Generic) 3 x Per Week/30 Days Discharge Instructions: Apply 3 multi-layer wrap as prescribed. Electronic Signature(s) Signed: 11/20/2022 4:47:39 PM By: Rosalio Loud MSN RN CNS WTA Signed: 11/25/2022 9:11:39 AM By: Kalman Shan DO Previous Signature: 11/20/2022 1:18:38 PM Version By: Kalman Shan DO Entered By: Rosalio Loud on 11/20/2022 16:29:36 -------------------------------------------------------------------------------- Problem List Details Patient Name: Date of Service: Mackenzie FF, MA RY JA [name] 11/20/2022 12:30 PM Medical Record Number: [id number] Patient Account Number: [id number] Date of Birth/Sex: Treating RN: August 23, 1932 (89 y.o. [name] Primary Care Provider: [name] Other Clinician: Referring Provider: Treating Provider/Extender: [name], [name] in Treatment: 6 Active Problems ICD-10 Encounter Code Description Active Date MDM Diagnosis WYNNETTE, DELUCIA ([id number]) [id number] Page 5 of 8 [phone] Non-pressure chronic ulcer of other part of right lower leg with fat layer 10/09/2022 No Yes exposed I87.311 Chronic venous hypertension (idiopathic) with ulcer of right lower extremity 10/09/2022 No Yes I48.20 Chronic atrial fibrillation, unspecified 10/09/2022 No  Yes Z79.01 Long term (current) use of anticoagulants 10/09/2022 No Yes Inactive Problems Resolved Problems Electronic Signature(s) Signed: 11/20/2022 1:18:38 PM By: Kalman Shan DO Signed: 11/20/2022 4:47:39 PM By: Rosalio Loud MSN RN CNS WTA Entered By: Rosalio Loud on 11/20/2022 13:13:17 -------------------------------------------------------------------------------- Progress Note Details Patient Name: Date of Service: Mackenzie FF, MA RY JA [name] 11/20/2022 12:30 PM Medical Record Number: [id number] Patient Account Number: [id number] Date of Birth/Sex: Treating RN: 03-15-1932 (89 y.o. [name] Primary  Care Provider: [name] Other Clinician: Referring Provider: Treating Provider/Extender: [name], [name] in Treatment: 6 Subjective Chief Complaint Information obtained from Patient 10/09/2022; right lower extremity wound History of Present Illness (HPI) 05/08/2021 upon evaluation today patient appears for initial inspection here in the clinic concerning issues that she has been having actually with a wound over the anterior portion of her right leg. She tells me that this was due to an injury where she struck this on something causing the initial trauma and she has been having trouble getting it healed. [name] over at [facility] has been helping take care of this. With that being said because it was not healing quite as quickly and effectively is what she wanted to see she did want [place] to take a look and make any recommendations to try to help things along. I am definitely happy to do that for sure. Fortunately there does not appear to be any signs of active infection at this time which is great news. The patient also does not seem to have too deep of the wound which is also good news. Her biggest concern is she really does not want to come weekly to the clinic especially since she has care at the [facility] location as far as getting the dressing changed and keeping an  eye on it. She does have a history of atrial fibrillation for which she is on warfarin and she does not like to do water aerobics twice a week although she is not able to right now due to the fact that she has this wound and cannot get in the water. That is a significant issue for her at this point. Otherwise does have noncompressible pulses based on what we are seeing today. She wears compression socks for her chronic venous insufficiency she has had previous ablation therapy. 05/29/2021 upon evaluation today patient appears to be doing well with regard to her wound. Overall this is actually showing signs of good improvement with significant overall decrease in size since last week even. Overall I feel like that she is doing awesome and making excellent progress. Over at [facility] they been taking good care of her. 06/14/2021 upon evaluation today patient appears to be doing excellent in regard to her wound. In fact she is completely healed based on what we are seeing currently. I do not see any signs of active infection at this time which is great news. 10/05/2022 [name] is an 89 year old female with a past medical history of chronic venous insufficiency and atrial fibrillation on warfarin that presents the clinic for a 1-62-monthhistory of nonhealing ulcer to the anterior aspect of the right lower extremity. She states she hit her leg against an object in the beginning [name], [name]([id number] Page 6 of 8 of December. She describes what started out as a hematoma and was debrided by a physician in her facility. She has been using Xeroform to the wound bed. She currently denies signs of infection. She has been wearing her compression stocking. 1/31; patient presents for follow-up. She has been using Hydrofera Blue and Santyl to the wound bed daily. There is been improvement in wound healing. She is scheduled for ABIs with TBI's today. She has no  issues or complaints today. She denies signs of infection. 2/7; patient presents for follow-up. She has been using Hydrofera Blue and Santyl to the wound bed. She had her ABIs with TBI's however the results have not been released.  She has no issues or complaints today. 2/14; patient presents for follow-up. She has been using Hydrofera Blue and Santyl to the wound bed. Her ABI resulted and showed noncompressible on the right with a TBI of 0.88 with triphasic waveforms. 2/21; patient presents for follow-up. We have been using Hydrofera Blue and Santyl to the wound bed under 3 layer compression. She has no issues or complaints today. There is been improvement in wound healing. 2/28; patient presents for follow-up. We have been using Hydrofera Blue and Santyl to the wound bed under 3 layer compression to the right lower extremity. There continues to be improvement in wound healing. She has no issues or complaints today. 3/6; patient presents for follow-up. We have been using Hydrofera Blue and Santyl to the wound bed under 3 layer compression to the right lower extremity. She has no issues or complaints today. Objective Constitutional Vitals Time Taken: 12:45 PM, Height: 68 in, Weight: 137 lbs, BMI: 20.8, Temperature: 97.9 F, Pulse: 73 bpm, Respiratory Rate: 16 breaths/min, Blood Pressure: 103/68 mmHg. General Notes: Right lower extremity: T the anterior aspect there is an open wound with granulation tissue and nonviable tissue. Good edema control. No o surrounding signs of infection including increased warmth, erythema or purulent drainage. Venous stasis dermatitis throughout the leg. Integumentary (Hair, Skin) Wound #2 status is Open. Original cause of wound was Skin T ear/Laceration. The date acquired was: 08/30/2022. The wound has been in treatment 6 weeks. The wound is located on the Right,Anterior Lower Leg. The wound measures 5cm length x 4.8cm width x 0.1cm depth; 18.85cm^2 area and  1.885cm^3 volume. There is Fat Layer (Subcutaneous Tissue) exposed. There is a medium amount of serosanguineous drainage noted. There is small (1-33%) red, pink granulation within the wound bed. There is a large (67-100%) amount of necrotic tissue within the wound bed including Adherent Slough. Assessment Active Problems ICD-10 Non-pressure chronic ulcer of other part of right lower leg with fat layer exposed Chronic venous hypertension (idiopathic) with ulcer of right lower extremity Chronic atrial fibrillation, unspecified Long term (current) use of anticoagulants Patient's wound appears well-healing. It is improved in size in appearance since last clinic visit. I debrided nonviable tissue. I recommended continuing the course with Hydrofera Blue and Santyl under 3 layer compression. Follow-up in 1 week. Procedures Wound #2 Pre-procedure diagnosis of Wound #2 is a Skin T located on the Right,Anterior Lower Leg . There was a Excisional Skin/Subcutaneous Tissue Debridement ear with a total area of 24 sq cm performed by Kalman Shan, MD. With the following instrument(s): Curette to remove Viable and Non-Viable tissue/material. Material removed includes Subcutaneous Tissue and Slough and after achieving pain control using Lidocaine 4% T opical Solution. No specimens were taken. A time out was conducted at 13:06, prior to the start of the procedure. A Minimum amount of bleeding was controlled with Pressure. The procedure was tolerated well. Post Debridement Measurements: 5cm length x 4.8cm width x 0.2cm depth; 3.77cm^3 volume. Character of Wound/Ulcer Post Debridement requires further debridement. Post procedure Diagnosis Wound #2: Same as Pre-Procedure Pre-procedure diagnosis of Wound #2 is a Skin T located on the Right,Anterior Lower Leg . There was a Three Layer Compression Therapy Procedure by ear Rosalio Loud, RN. Post procedure Diagnosis Wound #2: Same as Pre-Procedure [name], [name]  ([id number]) [id number] Page 7 of 8 Plan 1. In office sharp debridement 2. Santyl and Hydrofera Blue under 3 layer compressionooright lower extremity 3. Follow-up in 1 week Electronic Signature(s) Signed: 11/20/2022 1:18:38 PM By:  Kalman Shan DO Entered By: Kalman Shan on 11/20/2022 13:12:07 -------------------------------------------------------------------------------- ROS/PFSH Details Patient Name: Date of Service: [name], [name] 11/20/2022 12:30 PM Medical Record Number: [id number] Patient Account Number: [id number] Date of Birth/Sex: Treating RN: [birth year] (89 y.o. [name] Primary Care Provider: [name] Other Clinician: Referring Provider: Treating Provider/Extender: [name], [name] in Treatment: 6 Information Obtained From Patient Eyes Medical History: Positive for: Cataracts Cardiovascular Medical History: Positive for: Arrhythmia - Afib Endocrine Medical History: Negative for: Type I Diabetes; Type II Diabetes Musculoskeletal Medical History: Positive for: Osteoarthritis Neurologic Medical History: Positive for: Neuropathy HBO Extended History Items Eyes: Cataracts Immunizations Pneumococcal Vaccine: Received Pneumococcal Vaccination: Yes Received Pneumococcal Vaccination On or After 60th Birthday: Yes Implantable Devices None [name], [name] [place] ([id number]) [id number] Page 8 of 8 Family and Social History Former smoker; Alcohol Use: Never; Drug Use: No History; Caffeine Use: Rarely Electronic Signature(s) Signed: 11/20/2022 1:18:38 PM By: Kalman Shan DO Signed: 11/20/2022 4:47:39 PM By: Rosalio Loud MSN RN CNS WTA Entered By: Kalman Shan on 11/20/2022 13:13:37 -------------------------------------------------------------------------------- SuperBill Details Patient Name: Date of Service: Mackenzie FF, MA RY JA [name] 11/20/2022 Medical Record Number:  [id number] Patient Account Number: [id number] Date of Birth/Sex: Treating RN: 05-18-1932 (89 y.o. [name] Primary Care Provider: [name] Other Clinician: Referring Provider: Treating Provider/Extender: [name], [name] in Treatment: 6 Diagnosis Coding ICD-10 Codes Code Description [phone] Non-pressure chronic ulcer of other part of right lower leg with fat layer exposed I87.311 Chronic venous hypertension (idiopathic) with ulcer of right lower extremity I48.20 Chronic atrial fibrillation, unspecified Z79.01 Long term (current) use of anticoagulants Facility Procedures : CPT4 Code: [id number] Description: [place] - DEB SUBQ TISSUE 20 SQ CM/< ICD-10 Diagnosis Description [id number] Non-pressure chronic ulcer of other part of right lower leg with fat layer exp Modifier: osed Quantity: 1 : CPT4 Code: [id number] Description: 11045 - DEB SUBQ TISS EA ADDL 20CM ICD-10 Diagnosis Description [id number] Non-pressure chronic ulcer of other part of right lower leg with fat layer exp Modifier: osed Quantity: 1 Physician Procedures : CPT4 Code Description Modifier [id number] 11042 - WC PHYS SUBQ TISS 20 SQ CM ICD-10 Diagnosis Description L97.812 Non-pressure chronic ulcer of other part of right lower leg with fat layer exposed Quantity: 1 : [id number] - WC PHYS SUBQ TISS EA ADDL 20 CM ICD-10 Diagnosis Description [id number] Non-pressure chronic ulcer of other part of right lower leg with fat layer exposed Quantity: 1 Electronic Signature(s) Signed: 11/20/2022 1:18:38 PM By: Kalman Shan DO Entered By: Kalman Shan on 11/20/2022 13:13:13

## 2022-11-27 ENCOUNTER — Ambulatory Visit: Payer: PPO | Admitting: Internal Medicine

## 2022-11-28 DIAGNOSIS — I87311 Chronic venous hypertension (idiopathic) with ulcer of right lower extremity: Secondary | ICD-10-CM | POA: Diagnosis not present

## 2022-11-29 NOTE — Progress Notes (Signed)
KAILENA, MANDERFIELD [employer] ([id number]) [id number] Page 1 of 4 Visit Report for 11/28/2022 Arrival Information Details Patient Name: Date of Service: HO [name], [name] 11/28/2022 10:30 A M Medical Record Number: [id number] Patient Account Number: [id number] Date of Birth/Sex: Treating RN: 1932-05-28 (89 y.o. [name] Primary Care Octavia Velador: [name] Other Clinician: Massie Kluver Referring Corene Resnick: Treating Jeromey Kruer/Extender: Caryl Bis, [name] in Treatment: 7 Visit Information History Since Last Visit All ordered tests and consults were completed: No Patient Arrived: Kasandra Knudsen Added or deleted any medications: No Arrival Time: 10:31 Any new allergies or adverse reactions: No Transfer Assistance: None Had a fall or experienced change in No Patient Identification Verified: Yes activities of daily living that may affect Secondary Verification Process Completed: Yes risk of falls: Patient Requires Transmission-Based Precautions: No Signs or symptoms of abuse/neglect since last visito No Patient Has Alerts: Yes Hospitalized since last visit: No Patient Alerts: Patient on Blood Thinner Implantable device outside of the clinic excluding No Warfarin cellular tissue based products placed in the center since last visit: Has Dressing in Place as Prescribed: Yes Has Compression in Place as Prescribed: Yes Pain Present Now: No Electronic Signature(s) Signed: 11/28/2022 12:13:34 PM By: Massie Kluver Entered By: Massie Kluver on 11/28/2022 10:32:10 -------------------------------------------------------------------------------- Clinic Level of Care Assessment Details Patient Name: Date of Service: HO [name], [place] 11/28/2022 10:30 A M Medical Record Number: [id number] Patient Account Number: [id number] Date of Birth/Sex: Treating RN: Oct 29, 1931 (89 y.o. [name] Primary Care Cherisa Brucker: [name] Other Clinician: Massie Kluver Referring  Claudia Greenley: Treating Clarita Mcelvain/Extender: Caryl Bis, [name] in Treatment: 7 Clinic Level of Care Assessment Items TOOL 1 Quantity Score []  - 0 Use when EandM and Procedure is performed on INITIAL visit ASSESSMENTS - Nursing Assessment / Reassessment []  - 0 General Physical Exam (combine w/ comprehensive assessment (listed just below) when performed on new pt. [address] [place], [place] ([id number]) [id number] Page 2 of 4 []  - 0 Comprehensive Assessment (HX, ROS, Risk Assessments, Wounds Hx, etc.) ASSESSMENTS - Wound and Skin Assessment / Reassessment []  - 0 Dermatologic / Skin Assessment (not related to wound area) ASSESSMENTS - Ostomy and/or Continence Assessment and Care []  - 0 Incontinence Assessment and Management []  - 0 Ostomy Care Assessment and Management (repouching, etc.) PROCESS - Coordination of Care []  - 0 Simple Patient / Family Education for ongoing care []  - 0 Complex (extensive) Patient / Family Education for ongoing care []  - 0 Staff obtains Programmer, systems, Records, T Results / Process Orders est []  - 0 Staff telephones HHA, Nursing Homes / Clarify orders / etc []  - 0 Routine Transfer to another Facility (non-emergent condition) []  - 0 Routine Hospital Admission (non-emergent condition) []  - 0 New Admissions / Biomedical engineer / Ordering NPWT Apligraf, etc. , []  - 0 Emergency Hospital Admission (emergent condition) PROCESS - Special Needs []  - 0 Pediatric / Minor Patient Management []  - 0 Isolation Patient Management []  - 0 Hearing / Language / Visual special needs []  - 0 Assessment of Community assistance (transportation, D/C planning, etc.) []  - 0 Additional assistance / Altered mentation []  - 0 Support Surface(s) Assessment (bed, cushion, seat, etc.) INTERVENTIONS - Miscellaneous []  - 0 External ear exam []  - 0 Patient Transfer (multiple staff / Civil Service fast streamer / Similar devices) []  - 0 Simple Staple / Suture  removal (25 or less) []  - 0 Complex Staple / Suture removal (26 or more) []  - 0 Hypo/Hyperglycemic Management (do not check if billed  separately) []  - 0 Ankle / Brachial Index (ABI) - do not check if billed separately Has the patient been seen at the hospital within the last three years: Yes Total Score: 0 Level Of Care: ____ Electronic Signature(s) Signed: 11/28/2022 12:13:34 PM By: Massie Kluver Entered By: Massie Kluver on 11/28/2022 10:53:17 -------------------------------------------------------------------------------- Compression Therapy Details Patient Name: Date of Service: [name], [name] 11/28/2022 10:30 A M Medical Record Number: [id number] Patient Account Number: [id number] Date of Birth/Sex: Treating RN: 1932-08-31 (89 y.o. [name] Primary Care Jesicca Dipierro: [name] Other Clinician: Shadava, Clarizio [place] ([id number]) [id number] Page 3 of 4 Referring Dawaun Brancato: Treating Berle Fitz/Extender: Caryl Bis, [name] in Treatment: 7 Compression Therapy Performed for Wound Assessment: Wound #2 Right,Anterior Lower Leg Performed By: Lenice Pressman, Angie, Compression Type: Three Layer Pre Treatment ABI: 0.8 Electronic Signature(s) Signed: 11/28/2022 12:13:34 PM By: Massie Kluver Entered By: Massie Kluver on 11/28/2022 10:33:27 -------------------------------------------------------------------------------- Encounter Discharge Information Details Patient Name: Date of Service: [name], [name] 11/28/2022 10:30 A M Medical Record Number: [id number] Patient Account Number: [id number] Date of Birth/Sex: Treating RN: [birth year] (89 y.o. [name] Primary Care Roxy Filler: [name] Other Clinician: Massie Kluver Referring Shyrl Obi: Treating Celestial Barnfield/Extender: Caryl Bis, [name] in Treatment: 7 Encounter Discharge Information Items Discharge Condition: Stable Ambulatory Status: Cane Discharge  Destination: Home Transportation: Other Accompanied By: self Schedule Follow-up Appointment: Yes Clinical Summary of Care: Electronic Signature(s) Signed: 11/28/2022 12:13:34 PM By: Massie Kluver Entered By: Massie Kluver on 11/28/2022 10:53:10 -------------------------------------------------------------------------------- Wound Assessment Details Patient Name: Date of Service: HO [name] [place] 11/28/2022 10:30 A M Medical Record Number: [id number] Patient Account Number: [id number] Date of Birth/Sex: Treating RN: June 15, 1932 (89 y.o. [name] Primary Care Treston Coker: [name] Other Clinician: Massie Kluver Referring Erynn Vaca: Treating [name]/Extender: Caryl Bis, [name] in Treatment: 7 Wound Status Wound Number: 2 Primary Etiology: Skin Tear Wound Location: Right, Anterior Lower Leg Wound Status: Open Wounding Event: Skin Tear/Laceration Comorbid History: Cataracts, Arrhythmia, Osteoarthritis, Neuropathy Melchor, [name] ([id number]) [id number] Page 4 of 4 Date Acquired: 08/30/2022 Weeks Of Treatment: 7 Clustered Wound: No Wound Measurements Length: (cm) 5 Width: (cm) 4.8 Depth: (cm) 0.1 Area: (cm) 18.85 Volume: (cm) 1.885 % Reduction in Area: 41.8% % Reduction in Volume: 41.8% Epithelialization: Small (1-33%) Wound Description Classification: Full Thickness Without Exposed Suppor Exudate Amount: Medium Exudate Type: Serosanguineous Exudate Color: red, brown t Structures Foul Odor After Cleansing: No Slough/Fibrino Yes Wound Bed Granulation Amount: Small (1-33%) Exposed Structure Granulation Quality: Red, Pink Fascia Exposed: No Necrotic Amount: Large (67-100%) Fat Layer (Subcutaneous Tissue) Exposed: Yes Necrotic Quality: Adherent Slough Tendon Exposed: No Muscle Exposed: No Joint Exposed: No Bone Exposed: No Treatment Notes Wound #2 (Lower Leg) Wound Laterality: Right, Anterior Cleanser Soap and  Water Discharge Instruction: Gently cleanse wound with antibacterial soap, rinse and pat dry prior to dressing wounds Peri-Wound Care Topical Santyl Collagenase Ointment, 30 (gm), tube Discharge Instruction: apply nickel thick to wound bed only Primary Dressing Hydrofera Blue Ready Transfer Foam, 2.5x2.5 (in/in) Discharge Instruction: Apply Hydrofera Blue Ready to wound bed as directed Secondary Dressing ABD Pad 5x9 (in/in) Discharge Instruction: Cover with ABD pad Secured With Compression Wrap 3-LAYER WRAP - Profore Lite LF 3 Multilayer Compression Bandaging System Discharge Instruction: Apply 3 multi-layer wrap as prescribed. Compression Stockings Add-Ons Electronic Signature(s) Signed: 11/28/2022 12:13:34 PM By: Massie Kluver Signed: 11/28/2022 3:27:10 PM By: Gretta Cool, BSN, RN, CWS, Kim RN, BSN Entered By: Massie Kluver  on 11/28/2022 10:32:26

## 2022-12-02 DIAGNOSIS — I872 Venous insufficiency (chronic) (peripheral): Secondary | ICD-10-CM | POA: Diagnosis not present

## 2022-12-03 NOTE — Progress Notes (Signed)
LATAZIA, CHAMBER [place] ([id number]) [id number] Page 1 of 2 Visit Report for 11/28/2022 Physician Orders Details Patient Name: Date of Service: [name], [name] 11/28/2022 10:30 A M Medical Record Number: [id number] Patient Account Number: [id number] Date of Birth/Sex: Treating RN: [birth year] (89 y.o. [name] Primary Care Provider: [name] Other Clinician: Massie Kluver Referring Provider: Treating Provider/Extender: Caryl Bis, [name] in Treatment: 7 Verbal / Phone Orders: No Diagnosis Coding Follow-up Appointments Return Appointment in 1 week. Nurse Visit as needed [employer] wounds with antibacterial soap and water. May shower with wound dressing protected with water repellent cover or cast protector. No tub bath. Anesthetic (Use 'Patient Medications' Section for Anesthetic Order Entry) Lidocaine applied to wound bed Edema Control - Lymphedema / Segmental Compressive Device / Other Right Lower Extremity 3 Layer Compression System for Lymphedema. Elevate, Exercise Daily and A void Standing for Long Periods of Time. Elevate legs to the level of the heart and pump ankles as often as possible Wound Treatment Wound #2 - Lower Leg Wound Laterality: Right, Anterior Cleanser: Soap and Water 3 x Per Week/30 Days Discharge Instructions: Gently cleanse wound with antibacterial soap, rinse and pat dry prior to dressing wounds Topical: Santyl Collagenase Ointment, 30 (gm), tube 3 x Per Week/30 Days Discharge Instructions: apply nickel thick to wound bed only Prim Dressing: Hydrofera Blue Ready Transfer Foam, 2.5x2.5 (in/in) 3 x Per Week/30 Days ary Discharge Instructions: Apply Hydrofera Blue Ready to wound bed as directed Secondary Dressing: ABD Pad 5x9 (in/in) 3 x Per Week/30 Days Discharge Instructions: Cover with ABD pad Compression Wrap: 3-LAYER WRAP - Profore Lite LF 3 Multilayer Compression Bandaging System (Generic) 3  x Per Week/30 Days Discharge Instructions: Apply 3 multi-layer wrap as prescribed. Electronic Signature(s) Signed: 11/28/2022 12:13:34 PM By: Massie Kluver Signed: 12/02/2022 5:58:33 PM By: Worthy Keeler PA-C Entered By: Massie Kluver on 11/28/2022 10:33:53 [place], [name] ([id number]) [id number] Page 2 of 2 -------------------------------------------------------------------------------- SuperBill Details Patient Name: Date of Service: [name], [place] 11/28/2022 Medical Record Number: [id number] Patient Account Number: [id number] Date of Birth/Sex: Treating RN: 12-09-1931 (89 y.o. [name] Primary Care Provider: [name] Other Clinician: Massie Kluver Referring Provider: Treating Provider/Extender: Caryl Bis, [name] in Treatment: 7 Diagnosis Coding ICD-10 Codes Code Description [id number] Non-pressure chronic ulcer of other part of right lower leg with fat layer exposed I87.311 Chronic venous hypertension (idiopathic) with ulcer of right lower extremity I48.20 Chronic atrial fibrillation, unspecified Z79.01 Long term (current) use of anticoagulants Facility Procedures : CPT4 Code: [id number] Description: (Facility Use Only) [place] RT LEG Modifier: Quantity: 1 Electronic Signature(s) Signed: 11/28/2022 12:13:34 PM By: Massie Kluver Signed: 12/02/2022 5:58:33 PM By: Worthy Keeler PA-C Entered By: Massie Kluver on 11/28/2022 10:53:33

## 2022-12-04 ENCOUNTER — Encounter (HOSPITAL_BASED_OUTPATIENT_CLINIC_OR_DEPARTMENT_OTHER): Payer: PPO | Admitting: Internal Medicine

## 2022-12-04 ENCOUNTER — Ambulatory Visit (INDEPENDENT_AMBULATORY_CARE_PROVIDER_SITE_OTHER): Payer: PPO

## 2022-12-04 ENCOUNTER — Telehealth: Payer: Self-pay | Admitting: *Deleted

## 2022-12-04 VITALS — Ht 68.0 in | Wt 140.0 lb

## 2022-12-04 DIAGNOSIS — L97812 Non-pressure chronic ulcer of other part of right lower leg with fat layer exposed: Secondary | ICD-10-CM | POA: Diagnosis not present

## 2022-12-04 DIAGNOSIS — E782 Mixed hyperlipidemia: Secondary | ICD-10-CM

## 2022-12-04 DIAGNOSIS — E038 Other specified hypothyroidism: Secondary | ICD-10-CM

## 2022-12-04 DIAGNOSIS — Z0000 Encounter for general adult medical examination without abnormal findings: Secondary | ICD-10-CM | POA: Diagnosis not present

## 2022-12-04 DIAGNOSIS — I87311 Chronic venous hypertension (idiopathic) with ulcer of right lower extremity: Secondary | ICD-10-CM

## 2022-12-04 NOTE — Progress Notes (Signed)
I connected with  [name] on 12/04/22 by a audio enabled telemedicine application and verified that I am speaking with the correct person using two identifiers.  Patient Location: Home  Provider Location: Home Office  I discussed the limitations of evaluation and management by telemedicine. The patient expressed understanding and agreed to proceed.  Subjective:   [name] is a 89 y.o. female who presents for Medicare Annual (Subsequent) preventive examination.  Review of Systems      Cardiac Risk Factors include: advanced age (>12men, >86 women);sedentary lifestyle     Objective:    Today's Vitals   12/04/22 1339  Weight: 140 lb (63.5 kg)  Height: 5\' 8"  (1.727 m)   Body mass index is 21.29 kg/m.     12/04/2022    1:50 PM 09/02/2022    1:57 PM 11/15/2021    2:50 PM 11/14/2020    2:59 PM 11/02/2019    8:58 AM 10/27/2018   11:15 AM 10/21/2017    2:17 PM  Advanced Directives  Does Patient Have a Medical Advance Directive? Yes Yes Yes Yes Yes Yes Yes  Type of Paramedic of [place];Living will Living will;Out of facility DNR (pink MOST or yellow form) [place];Living will [place];Living will [place];Living will [place];Living will [place];Living will  Does patient want to make changes to medical advance directive? No - Patient declined No - Patient declined Yes (MAU/Ambulatory/Procedural Areas - Information given)      Copy of [place] in Chart? Yes - validated most recent copy scanned in chart (See row information)  Yes - validated most recent copy scanned in chart (See row information) Yes - validated most recent copy scanned in chart (See row information) No - copy requested No - copy requested No - copy requested    Current Medications (verified) Outpatient Encounter Medications as of 12/04/2022  Medication Sig    fluticasone-salmeterol (WIXELA INHUB) 250-50 MCG/ACT AEPB Inhale 1 puff into the lungs in the morning and at bedtime.   gabapentin (NEURONTIN) 100 MG capsule TAKE 3 CAPSULES(300 MG) BY MOUTH AT BEDTIME   ipratropium (ATROVENT) 0.03 % nasal spray Place 2 sprays into both nostrils 3 (three) times daily.   levothyroxine (SYNTHROID) 75 MCG tablet TAKE 1 TABLET BY MOUTH 30 MINS BEFORE EATING WITH A FULL GLASS OF WATER   SANTYL 250 UNIT/GM ointment Apply 1 Application topically at bedtime.   verapamil (CALAN-SR) 120 MG CR tablet TAKE 1 TABLET BY MOUTH EVERY DAY   warfarin (COUMADIN) 5 MG tablet TAKE 1 TABLET BY MOUTH DAILY EXCEPT TAKE 1/2 TABLET ON SUNDAYS AND THURSDAYS OR AS DIRECTED BY INR   No facility-administered encounter medications on file as of 12/04/2022.    Allergies (verified) Sulfa antibiotics   History: Past Medical History:  Diagnosis Date   Atrial fibrillation (HCC)    COPD (chronic obstructive pulmonary disease) (HCC)    Hypothyroidism    Microscopic colitis    Osteoporosis    Squamous cell carcinoma    Syncope    Past Surgical History:  Procedure Laterality Date   APPENDECTOMY     CATARACT EXTRACTION Bilateral    COLONOSCOPY     LIPOMA EXCISION     MULTIPLE TOOTH EXTRACTIONS     REFRACTIVE SURGERY     SQUAMOUS CELL CARCINOMA EXCISION  07/10/2017   left shin   Family History  Problem Relation Age of Onset  Alzheimer's disease Mother    Brain cancer Father    Cancer Father    Cancer Brother 32       bladder   Bladder Cancer Brother    Skin cancer Brother    Social History   Socioeconomic History   Marital status: Widowed    Spouse name: Not on file   Number of children: Not on file   Years of education: Not on file   Highest education level: Not on file  Occupational History   Not on file  Tobacco Use   Smoking status: Former    Packs/day: 1.00    Years: 40.00    Additional pack years: 0.00    Total pack years: 40.00    Types: Cigarettes     Quit date: 2009    Years since quitting: 15.2   Smokeless tobacco: Never  Vaping Use   Vaping Use: Never used  Substance and Sexual Activity   Alcohol use: Not Currently    Comment: social   Drug use: No   Sexual activity: Never  Other Topics Concern   Not on file  Social History Narrative   Recently moved to [facility] 03/2013 from [place].   Husband died in 11-03-2011.      Son (adopted) lives in [place], [place].  Two grandchildren- 25, 34 yo.   DNR- forms signed and returned to patient.            Social Determinants of Health   Financial Resource Strain: Low Risk  (12/04/2022)   Overall Financial Resource Strain (CARDIA)    Difficulty of Paying Living Expenses: Not hard at all  Food Insecurity: No Food Insecurity (12/04/2022)   Hunger Vital Sign    Worried About Running Out of Food in the Last Year: Never true    Ran Out of Food in the Last Year: Never true  Transportation Needs: No Transportation Needs (12/04/2022)   PRAPARE - Hydrologist (Medical): No    Lack of Transportation (Non-Medical): No  Physical Activity: Inactive (12/04/2022)   Exercise Vital Sign    Days of Exercise per Week: 0 days    Minutes of Exercise per Session: 0 min  Stress: Stress Concern Present (12/04/2022)   [place]    Feeling of Stress : To some extent  Social Connections: Moderately Isolated (12/04/2022)   Social Connection and Isolation Panel [NHANES]    Frequency of Communication with Friends and Family: More than three times a week    Frequency of Social Gatherings with Friends and Family: More than three times a week    Attends Religious Services: More than 4 times per year    Active Member of [employer] or Organizations: No    Attends Archivist Meetings: Never    Marital Status: Widowed    Tobacco Counseling Counseling given: Not Answered   Clinical Intake:  Pre-visit preparation  completed: Yes  Pain : No/denies pain     Nutritional Risks: Non-healing wound (Goes to wound clinic for right leg wound.) Diabetes: No  How often do you need to have someone help you when you read instructions, pamphlets, or other written materials from your doctor or pharmacy?: 1 - Never  Diabetic?no  Interpreter Needed?: No  Information entered by :: C.Blyss Lugar LPN   Activities of Daily Living    12/04/2022    1:52 PM  In your present state of health, do you have any difficulty  performing the following activities:  Hearing? 1  Comment wears aids  Vision? 0  Difficulty concentrating or making decisions? 1  Comment difficulty remembering  Walking or climbing stairs? 0  Dressing or bathing? 0  Doing errands, shopping? 0  Preparing Food and eating ? N  Using the Toilet? N  In the past six months, have you accidently leaked urine? N  Do you have problems with loss of bowel control? N  Managing your Medications? N  Managing your Finances? N  Housekeeping or managing your Housekeeping? N    Patient Care Team: Jinny Sanders, MD as PCP - General (Family Medicine) Oneta Rack, MD (Dermatology) Carloyn Manner, MD as Referring Physician (Otolaryngology) Estill Cotta, MD as Consulting Physician (Ophthalmology) Minna Merritts, MD as Consulting Physician (Cardiology) Josefine Class, MD as Referring Physician (Gastroenterology) Oneta Rack, MD (Dermatology)  Indicate any recent Medical Services you may have received from other than [facility] providers in the past year (date may be approximate).     Assessment:   This is a routine wellness examination for [name].  Hearing/Vision screen Hearing Screening - Comments:: aids Vision Screening - Comments:: Glasses - [facility]  Dietary issues and exercise activities discussed: Current Exercise Habits: The patient does not participate in regular exercise at present, Exercise limited by: None  identified   Goals Addressed   None    Depression Screen    12/04/2022    1:49 PM 11/15/2021    2:56 PM 11/14/2020    3:01 PM 11/02/2019    9:00 AM 10/27/2018   11:03 AM 10/21/2017    2:11 PM 10/15/2017    2:30 PM  PHQ 2/9 Scores  PHQ - 2 Score 0 0 0 2 0 0 0  PHQ- 9 Score   0 2 0 0     Fall Risk    12/04/2022    1:51 PM 01/24/2022    8:55 AM 11/15/2021    2:53 PM 11/14/2020    3:01 PM 11/02/2019    8:59 AM  Fall Risk   Falls in the past year? 1 0 0 0 0  Number falls in past yr: 0  0 0 0  Injury with Fall? 1  0 0 0  Comment right leg goes to wound clinic      Risk for fall due to : No Fall Risks  Impaired balance/gait Impaired balance/gait Impaired balance/gait  Follow up Falls prevention discussed;Falls evaluation completed Falls evaluation completed Falls prevention discussed Falls evaluation completed;Falls prevention discussed Falls evaluation completed;Falls prevention discussed    FALL RISK PREVENTION PERTAINING TO THE HOME:  Any stairs in or around the home? No  If so, are there any without handrails? No  Home free of loose throw rugs in walkways, pet beds, electrical cords, etc? Yes  Adequate lighting in your home to reduce risk of falls? No   ASSISTIVE DEVICES UTILIZED TO PREVENT FALLS:  Life alert? No  Use of a cane, walker or w/c? Yes  Grab bars in the bathroom? Yes  Shower chair or bench in shower? No  Elevated toilet seat or a handicapped toilet? Yes    Cognitive Function:    11/14/2020    3:07 PM 11/02/2019    9:06 AM 10/27/2018   11:03 AM 10/21/2017    2:15 PM 10/07/2016    1:29 PM  MMSE - Mini Mental State Exam  Orientation to time 5 5 5 5 5   Orientation to Place 5 5 5  5  5  Registration 3 3 3 3 3   Attention/ Calculation 5 5 0 0 0  Recall 3 3 3 3 3   Language- name 2 objects   0 0 0  Language- repeat 1 1 1 1 1   Language- follow 3 step command   3 3 3   Language- read & follow direction   0 0 0  Write a sentence   0 0 0  Copy design   0 0 0  Total score    20 20 20         12/04/2022    1:53 PM 11/15/2021    3:10 PM  6CIT Screen  What Year? 0 points 0 points  What month? 0 points 0 points  What time? 0 points 0 points  Count back from 20 0 points 0 points  Months in reverse 0 points 0 points  Repeat phrase 0 points 2 points  Total Score 0 points 2 points    Immunizations Immunization History  Administered Date(s) Administered   DTaP 04/30/2004   Influenza, High Dose Seasonal PF 06/17/2014   Influenza,inj,Quad PF,6+ Mos 06/22/2013   Influenza-Unspecified 06/26/2015, 06/25/2017, 06/24/2019, 06/16/2020, 06/21/2021, 07/02/2022   Moderna Covid-19 Vaccine Bivalent Booster 57yrs & up 02/12/2022   Moderna Sars-Covid-2 Vaccination 10/07/2019, 10/28/2019, 07/28/2020   Pfizer Covid-19 Vaccine Bivalent Booster 42yrs & up 06/07/2021   Pneumococcal Conjugate-13 09/29/2014   Pneumococcal Polysaccharide-23 06/01/2012   Respiratory Syncytial Virus Vaccine,Recomb Aduvanted(Arexvy) 09/30/2022   Varicella 08/06/2007   Zoster Recombinat (Shingrix) 01/17/2022, 03/18/2022    TDAP status: Due, Education has been provided regarding the importance of this vaccine. Advised may receive this vaccine at local pharmacy or Health Dept. Aware to provide a copy of the vaccination record if obtained from local pharmacy or Health Dept. Verbalized acceptance and understanding.  Flu Vaccine status: Up to date  Pneumococcal vaccine status: Up to date  Covid-19 vaccine status: Information provided on how to obtain vaccines.   Qualifies for Shingles Vaccine? Yes   Zostavax completed Yes   Shingrix Completed?: Yes  Screening Tests Health Maintenance  Topic Date Due   DTaP/Tdap/Td (2 - Tdap) 04/30/2014   DEXA SCAN  06/02/2017   COVID-19 Vaccine (6 - 2023-24 season) 05/17/2022   Medicare Annual Wellness (AWV)  12/04/2023   Pneumonia Vaccine 86+ Years old  Completed   INFLUENZA VACCINE  Completed   Zoster Vaccines- Shingrix  Completed   HPV VACCINES  Aged Out     Health Maintenance  Health Maintenance Due  Topic Date Due   DTaP/Tdap/Td (2 - Tdap) 04/30/2014   DEXA SCAN  06/02/2017   COVID-19 Vaccine (6 - 2023-24 season) 05/17/2022    Colorectal cancer screening: No longer required.   Mammogram status: No longer required due to age.  Bone Scan - last done 06/02/12  Lung Cancer Screening: (Low Dose CT Chest recommended if Age 73-80 years, 30 pack-year currently smoking OR have quit w/in 15years.) does not qualify.   Lung Cancer Screening Referral: no  Additional Screening:  Hepatitis C Screening: does not qualify; Completed no  Vision Screening: Recommended annual ophthalmology exams for early detection of glaucoma and other disorders of the eye. Is the patient up to date with their annual eye exam?  Yes  Who is the provider or what is the name of the office in which the patient attends annual eye exams? [facility] If pt is not established with a provider, would they like to be referred to a provider to establish care? No .  Dental Screening: Recommended annual dental exams for proper oral hygiene  Community Resource Referral / Chronic Care Management: CRR required this visit?  No   CCM required this visit?  No      Plan:     I have personally reviewed and noted the following in the patient's chart:   Medical and social history Use of alcohol, tobacco or illicit drugs  Current medications and supplements including opioid prescriptions. Patient is not currently taking opioid prescriptions. Functional ability and status Nutritional status Physical activity Advanced directives List of other physicians Hospitalizations, surgeries, and ER visits in previous 12 months Vitals Screenings to include cognitive, depression, and falls Referrals and appointments  In addition, I have reviewed and discussed with patient certain preventive protocols, quality metrics, and best practice recommendations. A written personalized care  plan for preventive services as well as general preventive health recommendations were provided to patient.     Lebron Conners, LPN   [id number]   Nurse Notes: none

## 2022-12-04 NOTE — Telephone Encounter (Signed)
-----   Message from Ellamae Sia sent at 12/04/2022 12:50 PM EDT ----- Regarding: Lab orders for Thursday, 4.4.24 Patient is scheduled for CPX labs, please order future labs, Thanks , Karna Christmas

## 2022-12-04 NOTE — Patient Instructions (Signed)
[name] , Thank you for taking time to come for your Medicare Wellness Visit. I appreciate your ongoing commitment to your health goals. Please review the following plan we discussed and let me know if I can assist you in the future.   These are the goals we discussed:  Goals      Increase physical activity     Starting 10/27/2018, I will continue to exercise for at least 30 minutes 6 days per week.      Patient Stated     11/02/2019, I will continue to exercise 7 days a week for 1 hour daily.      Patient Stated     11/14/2020, I will continue yoga, water aerobics and cardio 5 days a week for 1 hour.      Patient Stated     Would like to stop eating sweets        This is a list of the screening recommended for you and due dates:  Health Maintenance  Topic Date Due   DTaP/Tdap/Td vaccine (2 - Tdap) 04/30/2014   DEXA scan (bone density measurement)  06/02/2017   COVID-19 Vaccine (6 - 2023-24 season) 05/17/2022   Medicare Annual Wellness Visit  12/04/2023   Pneumonia Vaccine  Completed   Flu Shot  Completed   Zoster (Shingles) Vaccine  Completed   HPV Vaccine  Aged Out    Advanced directives: Copy on file.  Conditions/risks identified: Aim for 30 minutes of exercise or brisk walking, 6-8 glasses of water, and 5 servings of fruits and vegetables each day.   Next appointment: Follow up in one year for your annual wellness visit 12/09/23 @ 11:00 via telephone visit.   Preventive Care 87 Years and Older, Female Preventive care refers to lifestyle choices and visits with your health care provider that can promote health and wellness. What does preventive care include? A yearly physical exam. This is also called an annual well check. Dental exams once or twice a year. Routine eye exams. Ask your health care provider how often you should have your eyes checked. Personal lifestyle choices, including: Daily care of your teeth and gums. Regular physical activity. Eating a healthy  diet. Avoiding tobacco and drug use. Limiting alcohol use. Practicing safe sex. Taking low-dose aspirin every day. Taking vitamin and mineral supplements as recommended by your health care provider. What happens during an annual well check? The services and screenings done by your health care provider during your annual well check will depend on your age, overall health, lifestyle risk factors, and family history of disease. Counseling  Your health care provider may ask you questions about your: Alcohol use. Tobacco use. Drug use. Emotional well-being. Home and relationship well-being. Sexual activity. Eating habits. History of falls. Memory and ability to understand (cognition). Work and work Statistician. Reproductive health. Screening  You may have the following tests or measurements: Height, weight, and BMI. Blood pressure. Lipid and cholesterol levels. These may be checked every 5 years, or more frequently if you are over 65 years old. Skin check. Lung cancer screening. You may have this screening every year starting at age 49 if you have a 30-pack-year history of smoking and currently smoke or have quit within the past 15 years. Fecal occult blood test (FOBT) of the stool. You may have this test every year starting at age 22. Flexible sigmoidoscopy or colonoscopy. You may have a sigmoidoscopy every 5 years or a colonoscopy every 10 years starting at age 84. Hepatitis C  blood test. Hepatitis B blood test. Sexually transmitted disease (STD) testing. Diabetes screening. This is done by checking your blood sugar (glucose) after you have not eaten for a while (fasting). You may have this done every 1-3 years. Bone density scan. This is done to screen for osteoporosis. You may have this done starting at age 17. Mammogram. This may be done every 1-2 years. Talk to your health care provider about how often you should have regular mammograms. Talk with your health care provider about  your test results, treatment options, and if necessary, the need for more tests. Vaccines  Your health care provider may recommend certain vaccines, such as: Influenza vaccine. This is recommended every year. Tetanus, diphtheria, and acellular pertussis (Tdap, Td) vaccine. You may need a Td booster every 10 years. Zoster vaccine. You may need this after age 64. Pneumococcal 13-valent conjugate (PCV13) vaccine. One dose is recommended after age 2. Pneumococcal polysaccharide (PPSV23) vaccine. One dose is recommended after age 30. Talk to your health care provider about which screenings and vaccines you need and how often you need them. This information is not intended to replace advice given to you by your health care provider. Make sure you discuss any questions you have with your health care provider. Document Released: 09/29/2015 Document Revised: 05/22/2016 Document Reviewed: 07/04/2015 Elsevier Interactive Patient Education  2017 [place] Prevention in the Home Falls can cause injuries. They can happen to people of all ages. There are many things you can do to make your home safe and to help prevent falls. What can I do on the outside of my home? Regularly fix the edges of walkways and driveways and fix any cracks. Remove anything that might make you trip as you walk through a door, such as a raised step or threshold. Trim any bushes or trees on the path to your home. Use bright outdoor lighting. Clear any walking paths of anything that might make someone trip, such as rocks or tools. Regularly check to see if handrails are loose or broken. Make sure that both sides of any steps have handrails. Any raised decks and porches should have guardrails on the edges. Have any leaves, snow, or ice cleared regularly. Use sand or salt on walking paths during winter. Clean up any spills in your garage right away. This includes oil or grease spills. What can I do in the bathroom? Use  night lights. Install grab bars by the toilet and in the tub and shower. Do not use towel bars as grab bars. Use non-skid mats or decals in the tub or shower. If you need to sit down in the shower, use a plastic, non-slip stool. Keep the floor dry. Clean up any water that spills on the floor as soon as it happens. Remove soap buildup in the tub or shower regularly. Attach bath mats securely with double-sided non-slip rug tape. Do not have throw rugs and other things on the floor that can make you trip. What can I do in the bedroom? Use night lights. Make sure that you have a light by your bed that is easy to reach. Do not use any sheets or blankets that are too big for your bed. They should not hang down onto the floor. Have a firm chair that has side arms. You can use this for support while you get dressed. Do not have throw rugs and other things on the floor that can make you trip. What can I do in the kitchen?  Clean up any spills right away. Avoid walking on wet floors. Keep items that you use a lot in easy-to-reach places. If you need to reach something above you, use a strong step stool that has a grab bar. Keep electrical cords out of the way. Do not use floor polish or wax that makes floors slippery. If you must use wax, use non-skid floor wax. Do not have throw rugs and other things on the floor that can make you trip. What can I do with my stairs? Do not leave any items on the stairs. Make sure that there are handrails on both sides of the stairs and use them. Fix handrails that are broken or loose. Make sure that handrails are as long as the stairways. Check any carpeting to make sure that it is firmly attached to the stairs. Fix any carpet that is loose or worn. Avoid having throw rugs at the top or bottom of the stairs. If you do have throw rugs, attach them to the floor with carpet tape. Make sure that you have a light switch at the top of the stairs and the bottom of the  stairs. If you do not have them, ask someone to add them for you. What else can I do to help prevent falls? Wear shoes that: Do not have high heels. Have rubber bottoms. Are comfortable and fit you well. Are closed at the toe. Do not wear sandals. If you use a stepladder: Make sure that it is fully opened. Do not climb a closed stepladder. Make sure that both sides of the stepladder are locked into place. Ask someone to hold it for you, if possible. Clearly mark and make sure that you can see: Any grab bars or handrails. First and last steps. Where the edge of each step is. Use tools that help you move around (mobility aids) if they are needed. These include: Canes. Walkers. Scooters. Crutches. Turn on the lights when you go into a dark area. Replace any light bulbs as soon as they burn out. Set up your furniture so you have a clear path. Avoid moving your furniture around. If any of your floors are uneven, fix them. If there are any pets around you, be aware of where they are. Review your medicines with your doctor. Some medicines can make you feel dizzy. This can increase your chance of falling. Ask your doctor what other things that you can do to help prevent falls. This information is not intended to replace advice given to you by your health care provider. Make sure you discuss any questions you have with your health care provider. Document Released: 06/29/2009 Document Revised: 02/08/2016 Document Reviewed: 10/07/2014 Elsevier Interactive Patient Education  2017 [employer].

## 2022-12-05 ENCOUNTER — Ambulatory Visit (INDEPENDENT_AMBULATORY_CARE_PROVIDER_SITE_OTHER): Payer: PPO

## 2022-12-05 DIAGNOSIS — I482 Chronic atrial fibrillation, unspecified: Secondary | ICD-10-CM | POA: Diagnosis not present

## 2022-12-05 DIAGNOSIS — Z7901 Long term (current) use of anticoagulants: Secondary | ICD-10-CM

## 2022-12-05 DIAGNOSIS — D802 Selective deficiency of immunoglobulin A [IgA]: Secondary | ICD-10-CM | POA: Diagnosis not present

## 2022-12-05 LAB — POCT INR: INR: 2.4 (ref 2.0–3.0)

## 2022-12-05 NOTE — Progress Notes (Signed)
Mackenzie, [name] [place] ([id number]) [id number] Page 1 of 10 Visit Report for 12/04/2022 Arrival Information Details Patient Name: Date of Service: [name], [name] 12/04/2022 9:15 A M Medical Record Number: [id number] Patient Account Number: [id number] Date of Birth/Sex: Treating RN: October 19, 1931 (89 y.o. [name] Primary Care Burma Ketcher: [name] Other Clinician: Massie Kluver Referring Liberti Appleton: Treating Trevonne Nyland/Extender: [name], [name] in Treatment: 8 Visit Information History Since Last Visit All ordered tests and consults were completed: No Patient Arrived: [name] Added or deleted any medications: No Arrival Time: 09:08 Any new allergies or adverse reactions: No Transfer Assistance: None Had a fall or experienced change in No Patient Identification Verified: Yes activities of daily living that may affect Secondary Verification Process Completed: Yes risk of falls: Patient Requires Transmission-Based Precautions: No Signs or symptoms of abuse/neglect since last visito No Patient Has Alerts: Yes Hospitalized since last visit: No Patient Alerts: Patient on Blood Thinner Implantable device outside of the clinic excluding No Warfarin cellular tissue based products placed in the center since last visit: Has Dressing in Place as Prescribed: Yes Pain Present Now: No Electronic Signature(s) Signed: 12/04/2022 5:18:06 PM By: Massie Kluver Entered By: Massie Kluver on 12/04/2022 09:09:53 -------------------------------------------------------------------------------- Clinic Level of Care Assessment Details Patient Name: Date of Service: [name], [name] 12/04/2022 9:15 A M Medical Record Number: [id number] Patient Account Number: [id number] Date of Birth/Sex: Treating RN: 04-11-1932 (89 y.o. [name] Primary Care Selene Peltzer: [name] Other Clinician: Massie Kluver Referring Makyra [name]: Treating Dellia Donnelly/Extender: [name], [name] in Treatment: 8 Clinic Level of Care Assessment Items TOOL 1 Quantity Score []  - 0 Use when EandM and Procedure is performed on INITIAL visit ASSESSMENTS - Nursing Assessment / Reassessment []  - 0 General Physical Exam (combine w/ comprehensive assessment (listed just below) when performed on new pt. evals) []  - 0 Comprehensive Assessment (HX, ROS, Risk Assessments, Wounds Hx, etc.) Mackenzie, Karle [name] ([id number]) [id number] Page 2 of 10 ASSESSMENTS - Wound and Skin Assessment / Reassessment []  - 0 Dermatologic / Skin Assessment (not related to wound area) ASSESSMENTS - Ostomy and/or Continence Assessment and Care []  - 0 Incontinence Assessment and Management []  - 0 Ostomy Care Assessment and Management (repouching, etc.) PROCESS - Coordination of Care []  - 0 Simple Patient / Family Education for ongoing care []  - 0 Complex (extensive) Patient / Family Education for ongoing care []  - 0 Staff obtains Programmer, systems, Records, T Results / Process Orders est []  - 0 Staff telephones HHA, Nursing Homes / Clarify orders / etc []  - 0 Routine Transfer to another Facility (non-emergent condition) []  - 0 Routine Hospital Admission (non-emergent condition) []  - 0 New Admissions / Biomedical engineer / Ordering NPWT Apligraf, etc. , []  - 0 Emergency Hospital Admission (emergent condition) PROCESS - Special Needs []  - 0 Pediatric / Minor Patient Management []  - 0 Isolation Patient Management []  - 0 Hearing / Language / Visual special needs []  - 0 Assessment of Community assistance (transportation, D/C planning, etc.) []  - 0 Additional assistance / Altered mentation []  - 0 Support Surface(s) Assessment (bed, cushion, seat, etc.) INTERVENTIONS - Miscellaneous []  - 0 External ear exam []  - 0 Patient Transfer (multiple staff / Civil Service fast streamer / Similar devices) []  - 0 Simple Staple / Suture removal (25 or less) []  - 0 Complex  Staple / Suture removal (26 or more) []  - 0 Hypo/Hyperglycemic Management (do not check if billed separately) []  - 0 Ankle / Brachial  Index (ABI) - do not check if billed separately Has the patient been seen at the hospital within the last three years: Yes Total Score: 0 Level Of Care: ____ Electronic Signature(s) Signed: 12/04/2022 5:18:06 PM By: Massie Kluver Entered By: Massie Kluver on 12/04/2022 09:29:00 -------------------------------------------------------------------------------- Compression Therapy Details Patient Name: Date of Service: [name], [name] 12/04/2022 9:15 A M Medical Record Number: [id number] Patient Account Number: [id number] Date of Birth/Sex: Treating RN: 28-Dec-1931 (89 y.o. [name] Primary Care Viktor Philipp: [name] Other Clinician: Massie Kluver Referring Modestine Scherzinger: Treating [name]/Extender: Mackenzie, [name] [place] ([id number]) [id number] Page 3 of 10 Weeks in Treatment: 8 Compression Therapy Performed for Wound Assessment: Wound #2 Right,Anterior Lower Leg Performed By: Lenice Pressman, Angie, Compression Type: Three Layer Pre Treatment ABI: 0.8 Post Procedure Diagnosis Same as Pre-procedure Electronic Signature(s) Signed: 12/04/2022 5:18:06 PM By: Massie Kluver Entered By: Massie Kluver on 12/04/2022 09:28:46 -------------------------------------------------------------------------------- Encounter Discharge Information Details Patient Name: Date of Service: [name] 12/04/2022 9:15 A M Medical Record Number: [id number] Patient Account Number: [id number] Date of Birth/Sex: Treating RN: 08-Jul-1932 (89 y.o. [name], Kim Primary Care Meribeth Vitug: [name] Other Clinician: Massie Kluver Referring Janeva Peaster: Treating Andrue Dini/Extender: [name], [name] in Treatment: 8 Encounter Discharge Information Items Post Procedure Vitals Discharge Condition:  Stable Temperature (F): 97.8 Ambulatory Status: Cane Pulse (bpm): 90 Discharge Destination: Home Respiratory Rate (breaths/min): 16 Transportation: Other Blood Pressure (mmHg): 124/70 Accompanied By: self Schedule Follow-up Appointment: Yes Clinical Summary of Care: Electronic Signature(s) Signed: 12/04/2022 5:18:06 PM By: Massie Kluver Entered By: Massie Kluver on 12/04/2022 09:46:02 -------------------------------------------------------------------------------- Lower Extremity Assessment Details Patient Name: Date of Service: [name], [name] 12/04/2022 9:15 A M Medical Record Number: [id number] Patient Account Number: [id number] Date of Birth/Sex: Treating RN: 01-17-1932 (89 y.o. [name], Kim Primary Care Pravin Perezperez: [name] Other Clinician: Massie Kluver Referring Jilleen Essner: Treating Tina Gruner/Extender: [name], [name] in Treatment: 8 Edema Assessment Left: [Left: Right] [Right: :] Assessed: [Left: No] [Right: Yes] Edema: [Left: Ye] [Right: s] Calf Left: Right: Point of Measurement: 34 cm From Medial Instep 30 cm Ankle Left: Right: Point of Measurement: 12 cm From Medial Instep 19.4 cm Vascular Assessment Pulses: Dorsalis Pedis Palpable: [Right:Yes] Electronic Signature(s) Signed: 12/04/2022 5:15:10 PM By: Gretta Cool, BSN, RN, CWS, Kim RN, BSN Signed: 12/04/2022 5:18:06 PM By: Massie Kluver Entered By: Massie Kluver on 12/04/2022 09:19:51 -------------------------------------------------------------------------------- Multi Wound Chart Details Patient Name: Date of Service: [name], [name] 12/04/2022 9:15 A M Medical Record Number: [id number] Patient Account Number: [id number] Date of Birth/Sex: Treating RN: Apr 22, 1932 (89 y.o. [name] Primary Care Betul Brisky: [name] Other Clinician: Massie Kluver Referring Deaundra Kutzer: Treating Skyeler Smola/Extender: [name], [name] in Treatment: 8 Vital Signs Height(in):  68 Pulse(bpm): 90 Weight(lbs): 137 Blood Pressure(mmHg): 124/70 Body Mass Index(BMI): 20.8 Temperature(F): 97.8 Respiratory Rate(breaths/min): 16 [2:Photos:] [N/A:N/A] Right, Anterior Lower Leg N/A N/A Wound Location: Skin Tear/Laceration N/A N/A Wounding Event: Skin Tear N/A N/A Primary Etiology: Cataracts, Arrhythmia, Osteoarthritis, N/A N/A Comorbid History: Neuropathy 08/30/2022 N/A N/A Date Acquired: 8 N/A N/A Weeks of Treatment: Open N/A N/A Wound Status: No N/A N/A Wound Recurrence: 4.8x3.8x0.1 N/A N/A Measurements L x W x D (cm) Langland, Karle [name] ([id number]) [id number] Page 5 of 10 14.326 N/A N/A A (cm) : rea 1.433 N/A N/A Volume (cm) : 55.80% N/A N/A % Reduction in Area: 55.80% N/A N/A % Reduction in Volume: Full Thickness Without Exposed  N/A N/A Classification: Support Structures Medium N/A N/A Exudate Amount: Serosanguineous N/A N/A Exudate Type: red, brown N/A N/A Exudate Color: Small (1-33%) N/A N/A Granulation Amount: Red, Pink N/A N/A Granulation Quality: Large (67-100%) N/A N/A Necrotic Amount: Fat Layer (Subcutaneous Tissue): Yes N/A N/A Exposed Structures: Fascia: No Tendon: No Muscle: No Joint: No Bone: No Small (1-33%) N/A N/A Epithelialization: Treatment Notes Electronic Signature(s) Signed: 12/04/2022 5:18:06 PM By: Massie Kluver Entered By: Massie Kluver on 12/04/2022 09:19:55 -------------------------------------------------------------------------------- Multi-Disciplinary Care Plan Details Patient Name: Date of Service: [name], [name] 12/04/2022 9:15 A M Medical Record Number: [id number] Patient Account Number: [id number] Date of Birth/Sex: Treating RN: 1932-08-10 (89 y.o. [name], Kim Primary Care Milania Haubner: [name] Other Clinician: Massie Kluver Referring Raniyah Curenton: Treating Shakeisha Horine/Extender: [name], [name] in Treatment: 8 Active Inactive Necrotic  Tissue Nursing Diagnoses: Impaired tissue integrity related to necrotic/devitalized tissue Knowledge deficit related to management of necrotic/devitalized tissue Goals: Necrotic/devitalized tissue will be minimized in the wound bed Date Initiated: 10/09/2022 Target Resolution Date: 11/09/2022 Goal Status: Active Patient/caregiver will verbalize understanding of reason and process for debridement of necrotic tissue Date Initiated: 10/09/2022 Target Resolution Date: 11/09/2022 Goal Status: Active Interventions: Assess patient pain level pre-, during and post procedure and prior to discharge Provide education on necrotic tissue and debridement process Treatment Activities: Apply topical anesthetic as ordered : 10/09/2022 Excisional debridement : 10/09/2022 Notes: IBET, WAAG ([id number]) [id number] Page 6 of 10 Venous Leg Ulcer Nursing Diagnoses: Actual venous Insuffiency (use after diagnosis is confirmed) Knowledge deficit related to disease process and management Goals: Patient will maintain optimal edema control Date Initiated: 10/09/2022 Target Resolution Date: 11/09/2022 Goal Status: Active Patient/caregiver will verbalize understanding of disease process and disease management Date Initiated: 10/09/2022 Target Resolution Date: 11/09/2022 Goal Status: Active Verify adequate tissue perfusion prior to therapeutic compression application Date Initiated: 10/09/2022 Target Resolution Date: 11/09/2022 Goal Status: Active Interventions: Assess peripheral edema status every visit. Provide education on venous insufficiency Treatment Activities: T ordered outside of clinic : 10/09/2022 est Notes: Wound/Skin Impairment Nursing Diagnoses: Impaired tissue integrity Knowledge deficit related to ulceration/compromised skin integrity Goals: Patient will have a decrease in wound volume by X% from date: (specify in notes) Date Initiated: 10/09/2022 Target  Resolution Date: 11/09/2022 Goal Status: Active Patient/caregiver will verbalize understanding of skin care regimen Date Initiated: 10/09/2022 Target Resolution Date: 11/09/2022 Goal Status: Active Ulcer/skin breakdown will have a volume reduction of 30% by week 4 Date Initiated: 10/09/2022 Target Resolution Date: 11/09/2022 Goal Status: Active Ulcer/skin breakdown will have a volume reduction of 50% by week 8 Date Initiated: 10/09/2022 Target Resolution Date: 12/08/2022 Goal Status: Active Ulcer/skin breakdown will have a volume reduction of 80% by week 12 Date Initiated: 10/09/2022 Target Resolution Date: 01/08/2023 Goal Status: Active Ulcer/skin breakdown will heal within 14 weeks Date Initiated: 10/09/2022 Target Resolution Date: 01/22/2023 Goal Status: Active Interventions: Assess patient/caregiver ability to obtain necessary supplies Assess patient/caregiver ability to perform ulcer/skin care regimen upon admission and as needed Assess ulceration(s) every visit Provide education on ulcer and skin care Treatment Activities: Skin care regimen initiated : 10/09/2022 Notes: Electronic Signature(s) Signed: 12/04/2022 5:15:10 PM By: Gretta Cool, BSN, RN, CWS, Kim RN, BSN Signed: 12/04/2022 5:18:06 PM By: Massie Kluver Entered By: Massie Kluver on 12/04/2022 09:29:08 Og[place], Karle [name] ([id number]) [id number] Page 7 of 10 -------------------------------------------------------------------------------- Pain Assessment Details Patient Name: Date of Service: [name], [name] 12/04/2022 9:15 A M Medical Record Number: [id number] Patient Account Number: [id number] Date of  Birth/Sex: Treating RN: 1931-10-26 (89 y.o. [name] Primary Care Momin Misko: [name] Other Clinician: Massie Kluver Referring Deon Ivey: Treating Sheritta Deeg/Extender: [name], [name] in Treatment: 8 Active Problems Location of Pain Severity and Description of Pain Patient Has  Paino No Site Locations Pain Management and Medication Current Pain Management: Electronic Signature(s) Signed: 12/04/2022 5:15:10 PM By: Gretta Cool, BSN, RN, CWS, Kim RN, BSN Signed: 12/04/2022 5:18:06 PM By: Massie Kluver Entered By: Massie Kluver on 12/04/2022 09:12:55 -------------------------------------------------------------------------------- Patient/Caregiver Education Details Patient Name: Date of Service: [name] 3/20/2024andnbsp9:15 A M Medical Record Number: [id number] Patient Account Number: [id number] Date of Birth/Gender: Treating RN: 1931/09/29 (89 y.o. [name] Primary Care Physician: [name] Other Clinician: Massie Kluver Referring Physician: Treating Physician/Extender: [name], [name] in Treatment: [address], Karle [name] ([id number]) [id number] Page 8 of 10 Education Assessment Education Provided To: Patient Education Topics Provided Wound/Skin Impairment: Handouts: Other: continue wound care as directed Methods: Explain/Verbal Responses: State content correctly Electronic Signature(s) Signed: 12/04/2022 5:18:06 PM By: Massie Kluver Entered By: Massie Kluver on 12/04/2022 09:29:37 -------------------------------------------------------------------------------- Wound Assessment Details Patient Name: Date of Service: [name] 12/04/2022 9:15 A M Medical Record Number: [id number] Patient Account Number: [id number] Date of Birth/Sex: Treating RN: [birth year] (89 y.o. [name], Kim Primary Care Christyna Letendre: [name] Other Clinician: Massie Kluver Referring Lamesha Tibbits: Treating Merville Hijazi/Extender: [name], [name] in Treatment: 8 Wound Status Wound Number: 2 Primary Etiology: Skin Tear Wound Location: Right, Anterior Lower Leg Wound Status: Open Wounding Event: Skin Tear/Laceration Comorbid History: Cataracts, Arrhythmia, Osteoarthritis, Neuropathy Date Acquired:  08/30/2022 Weeks Of Treatment: 8 Clustered Wound: No Photos Wound Measurements Length: (cm) 4.8 Width: (cm) 3.8 Depth: (cm) 0.1 Area: (cm) 14.326 Volume: (cm) 1.433 % Reduction in Area: 55.8% % Reduction in Volume: 55.8% Epithelialization: Small (1-33%) Wound Description Classification: Full Thickness Without Exposed Support Structures Exudate Amount: Medium Exudate Type: Serosanguineous Capili, [name] ([id number]) Exudate Color: red, brown Foul Odor After Cleansing: No Slough/Fibrino Yes [id number] Page 9 of 10 Wound Bed Granulation Amount: Small (1-33%) Exposed Structure Granulation Quality: Red, Pink Fascia Exposed: No Necrotic Amount: Large (67-100%) Fat Layer (Subcutaneous Tissue) Exposed: Yes Necrotic Quality: Adherent Slough Tendon Exposed: No Muscle Exposed: No Joint Exposed: No Bone Exposed: No Treatment Notes Wound #2 (Lower Leg) Wound Laterality: Right, Anterior Cleanser Soap and Water Discharge Instruction: Gently cleanse wound with antibacterial soap, rinse and pat dry prior to dressing wounds Peri-Wound Care Topical Santyl Collagenase Ointment, 30 (gm), tube Discharge Instruction: apply nickel thick to wound bed only Primary Dressing Hydrofera Blue Ready Transfer Foam, 2.5x2.5 (in/in) Discharge Instruction: Apply Hydrofera Blue Ready to wound bed as directed Secondary Dressing ABD Pad 5x9 (in/in) Discharge Instruction: Cover with ABD pad Secured With Compression Wrap 3-LAYER WRAP - Profore Lite LF 3 Multilayer Compression Bandaging System Discharge Instruction: Apply 3 multi-layer wrap as prescribed. Compression Stockings Environmental education officer) Signed: 12/04/2022 5:15:10 PM By: Gretta Cool, BSN, RN, CWS, Kim RN, BSN Signed: 12/04/2022 5:18:06 PM By: Massie Kluver Entered By: Massie Kluver on 12/04/2022 09:18:33 -------------------------------------------------------------------------------- [place]  Details Patient Name: Date of Service: [name], [name] 12/04/2022 9:15 A M Medical Record Number: [id number] Patient Account Number: [id number] Date of Birth/Sex: Treating RN: 26-Jun-1932 (89 y.o. [name] Primary Care Tonique Mendonca: [name] Other Clinician: Massie Kluver Referring Meela Wareing: Treating Latreece Mochizuki/Extender: [name], [name] in Treatment: 8 Vital Signs Time Taken: 09:11 Temperature (F): 97.8 Height (in): 68 Pulse (bpm):  90 Weight (lbs): 137 Respiratory Rate (breaths/min): 16 Llewellyn, [name] ([id number]) [id number].pdf Page 10 of 10 Body Mass Index (BMI): 20.8 Blood Pressure (mmHg): 124/70 Reference Range: 80 - 120 mg / dl Electronic Signature(s) Signed: 12/04/2022 5:18:06 PM By: Massie Kluver Entered By: Massie Kluver on 12/04/2022 09:12:50

## 2022-12-05 NOTE — Progress Notes (Signed)
Received INR result of 2.4 from [facility] via email. [place] has [facility] draw labs on Mondays and Thursdays. PTT was 21.9.  Continue 1 tablet daily except take 1/2 tablet on Sundays and Thursdays. Recheck in 4 weeks, on 01/02/23.  Contacted pt and advised of dosing and when to recheck. Instructions also emailed to [place], RN and Verda Cumins, [place], at [facility].

## 2022-12-05 NOTE — Patient Instructions (Addendum)
Pre visit review using our clinic review tool, if applicable. No additional management support is needed unless otherwise documented below in the visit note.  Continue 1 tablet daily except take 1/2 tablet on Sundays and Thursdays. Recheck in 4 weeks, on 01/02/23.

## 2022-12-05 NOTE — Progress Notes (Signed)
KAMEE, WINTERSTEIN [place] ([id number]) [id number] Page 1 of 9 Visit Report for 12/04/2022 Chief Complaint Document Details Patient Name: Date of Service: [name], [name] 12/04/2022 9:15 A M Medical Record Number: [id number] Patient Account Number: [id number] Date of Birth/Sex: Treating RN: [birth year] (89 y.o. [name] Primary Care Provider: [name] Other Clinician: Massie Kluver Referring Provider: Treating Provider/Extender: [name], [name] in Treatment: 8 Information Obtained from: Patient Chief Complaint 10/09/2022; right lower extremity wound Electronic Signature(s) Signed: 12/04/2022 2:38:02 PM By: Kalman Shan DO Entered By: Kalman Shan on 12/04/2022 09:33:30 -------------------------------------------------------------------------------- Debridement Details Patient Name: Date of Service: Mackenzie FF, [name] 12/04/2022 9:15 A M Medical Record Number: [id number] Patient Account Number: [id number] Date of Birth/Sex: Treating RN: 08-Feb-1932 (89 y.o. Charolette Forward, Kim Primary Care Provider: [name] Other Clinician: Massie Kluver Referring Provider: Treating Provider/Extender: [name], [name] in Treatment: 8 Debridement Performed for Assessment: Wound #2 Right,Anterior Lower Leg Performed By: Physician Kalman Shan, MD Debridement Type: Debridement Level of Consciousness (Pre-procedure): Awake and Alert Pre-procedure Verification/Time Out Yes - 09:26 Taken: Start Time: 09:26 T Area Debrided (L x W): otal 4.8 (cm) x 3.8 (cm) = 18.24 (cm) Tissue and other material debrided: Viable, Non-Viable, Slough, Slough Level: Non-Viable Tissue Debridement Description: Selective/Open Wound Instrument: Curette Bleeding: Minimum Hemostasis Achieved: Pressure Response to Treatment: Procedure was tolerated well Level of Consciousness (Post- Awake and Alert procedure): Post Debridement Measurements of Total  Wound JAECI, CASSETTY ([id number]) [id number] Page 2 of 9 Length: (cm) 4.8 Width: (cm) 3.8 Depth: (cm) 0.1 Volume: (cm) 1.433 Character of Wound/Ulcer Post Debridement: Stable Post Procedure Diagnosis Same as Pre-procedure Electronic Signature(s) Signed: 12/04/2022 2:38:02 PM By: Kalman Shan DO Signed: 12/04/2022 5:15:10 PM By: Gretta Cool, BSN, RN, CWS, Kim RN, BSN Signed: 12/04/2022 5:18:06 PM By: Massie Kluver Entered By: Massie Kluver on 12/04/2022 09:27:42 -------------------------------------------------------------------------------- HPI Details Patient Name: Date of Service: Mackenzie FF, [name] 12/04/2022 9:15 A M Medical Record Number: [id number] Patient Account Number: [id number] Date of Birth/Sex: Treating RN: [birth year] (89 y.o. [name] Primary Care Provider: [name] Other Clinician: Massie Kluver Referring Provider: Treating Provider/Extender: [name], [name] in Treatment: 8 History of Present Illness HPI Description: 05/08/2021 upon evaluation today patient appears for initial inspection here in the clinic concerning issues that she has been having actually with a wound over the anterior portion of her right leg. She tells me that this was due to an injury where she struck this on something causing the initial trauma and she has been having trouble getting it healed. [name] over at [facility] has been helping take care of this. With that being said because it was not healing quite as quickly and effectively is what she wanted to see she did want [place] to take a look and make any recommendations to try to help things along. I am definitely happy to do that for sure. Fortunately there does not appear to be any signs of active infection at this time which is great news. The patient also does not seem to have too deep of the wound which is also good news. Her biggest concern is she really does not want to come weekly to the  clinic especially since she has care at the [facility] location as far as getting the dressing changed and keeping an eye on it. She does have a history of atrial fibrillation for which she is on warfarin and she does not  like to do water aerobics twice a week although she is not able to right now due to the fact that she has this wound and cannot get in the water. That is a significant issue for her at this point. Otherwise does have noncompressible pulses based on what we are seeing today. She wears compression socks for her chronic venous insufficiency she has had previous ablation therapy. 05/29/2021 upon evaluation today patient appears to be doing well with regard to her wound. Overall this is actually showing signs of good improvement with significant overall decrease in size since last week even. Overall I feel like that she is doing awesome and making excellent progress. Over at [facility] they been taking good care of her. 06/14/2021 upon evaluation today patient appears to be doing excellent in regard to her wound. In fact she is completely healed based on what we are seeing currently. I do not see any signs of active infection at this time which is great news. 10/05/2022 [name] is an 89 year old female with a past medical history of chronic venous insufficiency and atrial fibrillation on warfarin that presents the clinic for a 1-34-month history of nonhealing ulcer to the anterior aspect of the right lower extremity. She states she hit her leg against an object in the beginning of December. She describes what started out as a hematoma and was debrided by a physician in her facility. She has been using Xeroform to the wound bed. She currently denies signs of infection. She has been wearing her compression stocking. 1/31; patient presents for follow-up. She has been using Hydrofera Blue and Santyl to the wound bed daily. There is been improvement in wound healing. She is scheduled  for ABIs with TBI's today. She has no issues or complaints today. She denies signs of infection. 2/7; patient presents for follow-up. She has been using Hydrofera Blue and Santyl to the wound bed. She had her ABIs with TBI's however the results have not been released. She has no issues or complaints today. 2/14; patient presents for follow-up. She has been using Hydrofera Blue and Santyl to the wound bed. Her ABI resulted and showed noncompressible on the right with a TBI of 0.88 with triphasic waveforms. 2/21; patient presents for follow-up. We have been using Hydrofera Blue and Santyl to the wound bed under 3 layer compression. She has no issues or complaints today. There is been improvement in wound healing. 2/28; patient presents for follow-up. We have been using Hydrofera Blue and Santyl to the wound bed under 3 layer compression to the right lower extremity. There continues to be improvement in wound healing. She has no issues or complaints today. 3/6; patient presents for follow-up. We have been using Hydrofera Blue and Santyl to the wound bed under 3 layer compression to the right lower extremity. [name], [name] [place] ([id number]) [id number] Page 3 of 9 She has no issues or complaints today. 3/20; patient presents for follow-up. We have been using Hydrofera Blue and Santyl to the wound bed under 3 layer compression. Wound is cleaner and smaller today. She has no issues or complaints today. She gets the wrap changed 2 additional times a week at her living facility. Electronic Signature(s) Signed: 12/04/2022 2:38:02 PM By: Kalman Shan DO Entered By: Kalman Shan on 12/04/2022 09:34:09 -------------------------------------------------------------------------------- Physical Exam Details Patient Name: Date of Service: Mackenzie FF, [name] 12/04/2022 9:15 A M Medical Record Number: [id number] Patient Account Number: [id number] Date of Birth/Sex: Treating  RN: 15-Jan-1932 (  89 y.o. [name] Primary Care Provider: [name] Other Clinician: Massie Kluver Referring Provider: Treating Provider/Extender: [name], [name] in Treatment: 8 Constitutional . Cardiovascular . Psychiatric . Notes Right lower extremity: T the anterior aspect there is an open wound with granulation tissue and nonviable tissue. Good edema control. No surrounding signs of o infection including increased warmth, erythema or purulent drainage. Venous stasis dermatitis throughout the leg. Electronic Signature(s) Signed: 12/04/2022 2:38:02 PM By: Kalman Shan DO Entered By: Kalman Shan on 12/04/2022 09:34:25 -------------------------------------------------------------------------------- Physician Orders Details Patient Name: Date of Service: Mackenzie FF, [name] 12/04/2022 9:15 A M Medical Record Number: [id number] Patient Account Number: [id number] Date of Birth/Sex: Treating RN: [birth year] (89 y.o. Charolette Forward, Kim Primary Care Provider: [name] Other Clinician: Massie Kluver Referring Provider: Treating Provider/Extender: Weyman Croon Weeks in Treatment: 8 Verbal / Phone Orders: Yes Clinician: Cornell Barman Read Back and Verified: Yes Diagnosis [address] [place], Meland Karle [name] ([id number]) [id number] Page 4 of 9 Follow-up Appointments Return Appointment in 1 week. Nurse Visit as needed [employer] wounds with antibacterial soap and water. May shower with wound dressing protected with water repellent cover or cast protector. No tub bath. Anesthetic (Use 'Patient Medications' Section for Anesthetic Order Entry) Lidocaine applied to wound bed Edema Control - Lymphedema / Segmental Compressive Device / Other Right Lower Extremity 3 Layer Compression System for Lymphedema. Elevate, Exercise Daily and A void Standing for Long Periods of Time. Elevate legs to the level of the  heart and pump ankles as often as possible Wound Treatment Wound #2 - Lower Leg Wound Laterality: Right, Anterior Cleanser: Soap and Water 3 x Per Week/30 Days Discharge Instructions: Gently cleanse wound with antibacterial soap, rinse and pat dry prior to dressing wounds Topical: Santyl Collagenase Ointment, 30 (gm), tube 3 x Per Week/30 Days Discharge Instructions: apply nickel thick to wound bed only Prim Dressing: Hydrofera Blue Ready Transfer Foam, 2.5x2.5 (in/in) 3 x Per Week/30 Days ary Discharge Instructions: Apply Hydrofera Blue Ready to wound bed as directed Secondary Dressing: ABD Pad 5x9 (in/in) 3 x Per Week/30 Days Discharge Instructions: Cover with ABD pad Compression Wrap: 3-LAYER WRAP - Profore Lite LF 3 Multilayer Compression Bandaging System (Generic) 3 x Per Week/30 Days Discharge Instructions: Apply 3 multi-layer wrap as prescribed. Electronic Signature(s) Signed: 12/04/2022 2:38:02 PM By: Kalman Shan DO Signed: 12/04/2022 5:18:06 PM By: Massie Kluver Entered By: Massie Kluver on 12/04/2022 09:44:00 -------------------------------------------------------------------------------- Problem List Details Patient Name: Date of Service: Mackenzie FF, [name] 12/04/2022 9:15 A M Medical Record Number: [id number] Patient Account Number: [id number] Date of Birth/Sex: Treating RN: May 05, 1932 (89 y.o. [name] Primary Care Provider: [name] Other Clinician: Massie Kluver Referring Provider: Treating Provider/Extender: [name], [name] in Treatment: 8 Active Problems ICD-10 Encounter Code Description Active Date MDM Diagnosis [phone] Non-pressure chronic ulcer of other part of right lower leg with fat layer 10/09/2022 No Yes exposed I87.311 Chronic venous hypertension (idiopathic) with ulcer of right lower extremity 10/09/2022 No Yes [name], [name] ([id number]) [id number] Page 5 of 9 I48.20 Chronic atrial  fibrillation, unspecified 10/09/2022 No Yes Z79.01 Long term (current) use of anticoagulants 10/09/2022 No Yes Inactive Problems Resolved Problems Electronic Signature(s) Signed: 12/04/2022 2:38:02 PM By: Kalman Shan DO Entered By: Kalman Shan on 12/04/2022 09:33:27 -------------------------------------------------------------------------------- Progress Note Details Patient Name: Date of Service: Mackenzie FF, [name] 12/04/2022 9:15 A M Medical Record Number: [id number] Patient Account Number: [id number] Date  of Birth/Sex: Treating RN: 01-21-1932 (89 y.o. [name] Primary Care Provider: [name] Other Clinician: Massie Kluver Referring Provider: Treating Provider/Extender: [name], [name] in Treatment: 8 Subjective Chief Complaint Information obtained from Patient 10/09/2022; right lower extremity wound History of Present Illness (HPI) 05/08/2021 upon evaluation today patient appears for initial inspection here in the clinic concerning issues that she has been having actually with a wound over the anterior portion of her right leg. She tells me that this was due to an injury where she struck this on something causing the initial trauma and she has been having trouble getting it healed. [name] over at [facility] has been helping take care of this. With that being said because it was not healing quite as quickly and effectively is what she wanted to see she did want [place] to take a look and make any recommendations to try to help things along. I am definitely happy to do that for sure. Fortunately there does not appear to be any signs of active infection at this time which is great news. The patient also does not seem to have too deep of the wound which is also good news. Her biggest concern is she really does not want to come weekly to the clinic especially since she has care at the [facility] location as far as getting the dressing changed and keeping an eye  on it. She does have a history of atrial fibrillation for which she is on warfarin and she does not like to do water aerobics twice a week although she is not able to right now due to the fact that she has this wound and cannot get in the water. That is a significant issue for her at this point. Otherwise does have noncompressible pulses based on what we are seeing today. She wears compression socks for her chronic venous insufficiency she has had previous ablation therapy. 05/29/2021 upon evaluation today patient appears to be doing well with regard to her wound. Overall this is actually showing signs of good improvement with significant overall decrease in size since last week even. Overall I feel like that she is doing awesome and making excellent progress. Over at [facility] they been taking good care of her. 06/14/2021 upon evaluation today patient appears to be doing excellent in regard to her wound. In fact she is completely healed based on what we are seeing currently. I do not see any signs of active infection at this time which is great news. 10/05/2022 Ms. Brittnea Stukel is an 89 year old female with a past medical history of chronic venous insufficiency and atrial fibrillation on warfarin that presents the clinic for a 1-30-month history of nonhealing ulcer to the anterior aspect of the right lower extremity. She states she hit her leg against an object in the beginning of December. She describes what started out as a hematoma and was debrided by a physician in her facility. She has been using Xeroform to the wound bed. She currently denies signs of infection. She has been wearing her compression stocking. 1/31; patient presents for follow-up. She has been using Hydrofera Blue and Santyl to the wound bed daily. There is been improvement in wound healing. She is scheduled for ABIs with TBI's today. She has no issues or complaints today. She denies signs of infection. 2/7; patient presents for  follow-up. She has been using Hydrofera Blue and Santyl to the wound bed. She had her ABIs with TBI's however the results  have not been released. She has no issues or complaints today. [name], [name] [place] ([id number]) [id number] Page 6 of 9 2/14; patient presents for follow-up. She has been using Hydrofera Blue and Santyl to the wound bed. Her ABI resulted and showed noncompressible on the right with a TBI of 0.88 with triphasic waveforms. 2/21; patient presents for follow-up. We have been using Hydrofera Blue and Santyl to the wound bed under 3 layer compression. She has no issues or complaints today. There is been improvement in wound healing. 2/28; patient presents for follow-up. We have been using Hydrofera Blue and Santyl to the wound bed under 3 layer compression to the right lower extremity. There continues to be improvement in wound healing. She has no issues or complaints today. 3/6; patient presents for follow-up. We have been using Hydrofera Blue and Santyl to the wound bed under 3 layer compression to the right lower extremity. She has no issues or complaints today. 3/20; patient presents for follow-up. We have been using Hydrofera Blue and Santyl to the wound bed under 3 layer compression. Wound is cleaner and smaller today. She has no issues or complaints today. She gets the wrap changed 2 additional times a week at her living facility. Objective Constitutional Vitals Time Taken: 9:11 AM, Height: 68 in, Weight: 137 lbs, BMI: 20.8, Temperature: 97.8 F, Pulse: 90 bpm, Respiratory Rate: 16 breaths/min, Blood Pressure: 124/70 mmHg. General Notes: Right lower extremity: T the anterior aspect there is an open wound with granulation tissue and nonviable tissue. Good edema control. No o surrounding signs of infection including increased warmth, erythema or purulent drainage. Venous stasis dermatitis throughout the leg. Integumentary (Hair, Skin) Wound #2 status is  Open. Original cause of wound was Skin T ear/Laceration. The date acquired was: 08/30/2022. The wound has been in treatment 8 weeks. The wound is located on the Right,Anterior Lower Leg. The wound measures 4.8cm length x 3.8cm width x 0.1cm depth; 14.326cm^2 area and 1.433cm^3 volume. There is Fat Layer (Subcutaneous Tissue) exposed. There is a medium amount of serosanguineous drainage noted. There is small (1-33%) red, pink granulation within the wound bed. There is a large (67-100%) amount of necrotic tissue within the wound bed including Adherent Slough. Assessment Active Problems ICD-10 Non-pressure chronic ulcer of other part of right lower leg with fat layer exposed Chronic venous hypertension (idiopathic) with ulcer of right lower extremity Chronic atrial fibrillation, unspecified Long term (current) use of anticoagulants Patient's wound has shown improvement in size and appearance since last clinic visit. I debrided nonviable tissue. I recommended continue the course with Santyl and Hydrofera Blue under 3 layer compression to the right lower extremity. Follow-up in 1 week. Procedures Wound #2 Pre-procedure diagnosis of Wound #2 is a Skin T located on the Right,Anterior Lower Leg . There was a Selective/Open Wound Non-Viable Tissue ear Debridement with a total area of 18.24 sq cm performed by Kalman Shan, MD. With the following instrument(s): Curette to remove Viable and Non-Viable tissue/material. Material removed includes [place]. A time out was conducted at 09:26, prior to the start of the procedure. A Minimum amount of bleeding was controlled with Pressure. The procedure was tolerated well. Post Debridement Measurements: 4.8cm length x 3.8cm width x 0.1cm depth; 1.433cm^3 volume. Character of Wound/Ulcer Post Debridement is stable. Post procedure Diagnosis Wound #2: Same as Pre-Procedure Pre-procedure diagnosis of Wound #2 is a Skin T located on the Right,Anterior Lower Leg .  There was a Three Layer Compression Therapy Procedure with a ear pre-treatment ABI  of 0.8 by Massie Kluver. Post procedure Diagnosis Wound #2: Same as Pre-Procedure Plan Follow-up Appointments: Return Appointment in 1 week. Nurse Visit as needed [name], [name] ([id number]) [id number] Page 7 of 9 Bathing/ Shower/ Hygiene: Wash wounds with antibacterial soap and water. May shower with wound dressing protected with water repellent cover or cast protector. No tub bath. Anesthetic (Use 'Patient Medications' Section for Anesthetic Order Entry): Lidocaine applied to wound bed Edema Control - Lymphedema / Segmental Compressive Device / Other: 3 Layer Compression System for Lymphedema. Elevate, Exercise Daily and Avoid Standing for Long Periods of Time. Elevate legs to the level of the heart and pump ankles as often as possible WOUND #2: - Lower Leg Wound Laterality: Right, Anterior Cleanser: Soap and Water 3 x Per Week/30 Days Discharge Instructions: Gently cleanse wound with antibacterial soap, rinse and pat dry prior to dressing wounds Topical: Santyl Collagenase Ointment, 30 (gm), tube 3 x Per Week/30 Days Discharge Instructions: apply nickel thick to wound bed only Prim Dressing: Hydrofera Blue Ready Transfer Foam, 2.5x2.5 (in/in) 3 x Per Week/30 Days ary Discharge Instructions: Apply Hydrofera Blue Ready to wound bed as directed Secondary Dressing: ABD Pad 5x9 (in/in) 3 x Per Week/30 Days Discharge Instructions: Cover with ABD pad Com pression Wrap: 3-LAYER WRAP - Profore Lite LF 3 Multilayer Compression Bandaging System (Generic) 3 x Per Week/30 Days Discharge Instructions: Apply 3 multi-layer wrap as prescribed. 1. In office sharp debridement 2. Hydrofera Blue and Santyl under 3 layer compressionooright lower extremity 3. Follow-up in 1 week Electronic Signature(s) Signed: 12/04/2022 2:38:02 PM By: Kalman Shan DO Entered By: Kalman Shan on  12/04/2022 09:36:01 -------------------------------------------------------------------------------- ROS/PFSH Details Patient Name: Date of Service: Mackenzie FF, [name] 12/04/2022 9:15 A M Medical Record Number: [id number] Patient Account Number: [id number] Date of Birth/Sex: Treating RN: 1932-01-01 (89 y.o. [name] Primary Care Provider: [name] Other Clinician: Massie Kluver Referring Provider: Treating Provider/Extender: [name], [name] in Treatment: 8 Information Obtained From Patient Eyes Medical History: Positive for: Cataracts Cardiovascular Medical History: Positive for: Arrhythmia - Afib Endocrine Medical History: Negative for: Type I Diabetes; Type II Diabetes Musculoskeletal Medical History: Positive for: Osteoarthritis [name], [name] ([id number]) [id number] Page 8 of 9 Neurologic Medical History: Positive for: Neuropathy HBO Extended History Items Eyes: Cataracts Immunizations Pneumococcal Vaccine: Received Pneumococcal Vaccination: Yes Received Pneumococcal Vaccination On or After 60th Birthday: Yes Implantable Devices None Family and Social History Former smoker; Alcohol Use: Never; Drug Use: No History; Caffeine Use: Rarely Electronic Signature(s) Signed: 12/04/2022 2:38:02 PM By: Kalman Shan DO Signed: 12/04/2022 5:15:10 PM By: Gretta Cool, BSN, RN, CWS, Kim RN, BSN Entered By: Kalman Shan on 12/04/2022 09:36:41 -------------------------------------------------------------------------------- [place] Details Patient Name: Date of Service: Mackenzie FF, [name] 12/04/2022 Medical Record Number: [id number] Patient Account Number: [id number] Date of Birth/Sex: Treating RN: 1932-07-03 (89 y.o. Charolette Forward, Kim Primary Care Provider: [name] Other Clinician: Massie Kluver Referring Provider: Treating Provider/Extender: [name], [name] in Treatment: 8 Diagnosis  Coding ICD-10 Codes Code Description [phone] Non-pressure chronic ulcer of other part of right lower leg with fat layer exposed I87.311 Chronic venous hypertension (idiopathic) with ulcer of right lower extremity I48.20 Chronic atrial fibrillation, unspecified Z79.01 Long term (current) use of anticoagulants Facility Procedures : CPT4 Code: [id number] Description: [id number] - DEBRIDE WOUND 1ST 20 SQ CM OR < ICD-10 Diagnosis Description L97.812 Non-pressure chronic ulcer of other part of right lower leg with fat layer expos I87.311 Chronic venous hypertension (idiopathic) with  ulcer of right lower  extremity Modifier: ed Quantity: 1 Physician Procedures : CPT4 Code Description Modifier [id number] - WC PHYS DEBR WO ANESTH 20 SQ CM ICD-10 Diagnosis Description [id number] Non-pressure chronic ulcer of other part of right lower leg with fat layer exposed I87.311 Chronic venous hypertension (idiopathic) with  ulcer of right lower extremity [name], [name] ([id number]) [id number] Quantity: 1 f Page 9 of 9 Electronic Signature(s) Signed: 12/04/2022 2:38:02 PM By: Kalman Shan DO Entered By: Kalman Shan on 12/04/2022 09:36:12

## 2022-12-11 ENCOUNTER — Encounter (HOSPITAL_BASED_OUTPATIENT_CLINIC_OR_DEPARTMENT_OTHER): Payer: PPO | Admitting: Internal Medicine

## 2022-12-11 DIAGNOSIS — L97812 Non-pressure chronic ulcer of other part of right lower leg with fat layer exposed: Secondary | ICD-10-CM | POA: Diagnosis not present

## 2022-12-11 DIAGNOSIS — I87311 Chronic venous hypertension (idiopathic) with ulcer of right lower extremity: Secondary | ICD-10-CM | POA: Diagnosis not present

## 2022-12-11 NOTE — Progress Notes (Signed)
[name], [name] [place] ([id number]) [id number] Page 1 of 7 Visit Report for 12/11/2022 Arrival Information Details Patient Name: Date of Service: [name], [place] 12/11/2022 12:30 PM Medical Record Number: [id number] Patient Account Number: [id number] Date of Birth/Sex: Treating RN: Nov 22, 1931 (89 y.o. [name] Primary Care Melina Mosteller: [name] Other Clinician: Referring Milcah Dulany: Treating Corbin Falck/Extender: [name], [name] in Treatment: 9 Visit Information History Since Last Visit Added or deleted any medications: No Patient Arrived: Ambulatory Has Dressing in Place as Prescribed: Yes Arrival Time: 12:32 Has Compression in Place as Prescribed: Yes Accompanied By: self Pain Present Now: No Transfer Assistance: None Patient Identification Verified: Yes Secondary Verification Process Completed: Yes Patient Requires Transmission-Based Precautions: No Patient Has Alerts: Yes Patient Alerts: Patient on Blood Thinner Warfarin Electronic Signature(s) Signed: 12/11/2022 4:12:46 PM By: Rosalio Loud MSN RN CNS WTA Entered By: Rosalio Loud on 12/11/2022 12:33:02 -------------------------------------------------------------------------------- Clinic Level of Care Assessment Details Patient Name: Date of Service: [name], [name] 12/11/2022 12:30 PM Medical Record Number: [id number] Patient Account Number: [id number] Date of Birth/Sex: Treating RN: [birth year] (89 y.o. [name] Primary Care Derion Kreiter: [name] Other Clinician: Referring Naveyah Iacovelli: Treating Alazia Crocket/Extender: [name], [name] in Treatment: 9 Clinic Level of Care Assessment Items TOOL 1 Quantity Score []  - 0 Use when EandM and Procedure is performed on INITIAL visit ASSESSMENTS - Nursing Assessment / Reassessment []  - 0 General Physical Exam (combine w/ comprehensive assessment (listed just below) when performed on new pt. evals) []  -  0 Comprehensive Assessment (HX, ROS, Risk Assessments, Wounds Hx, etc.) ASSESSMENTS - Wound and Skin Assessment / Reassessment []  - 0 Dermatologic / Skin Assessment (not related to wound area) ASSESSMENTS - Ostomy and/or Continence Assessment and Care []  - 0 Incontinence Assessment and Management []  - 0 Ostomy Care Assessment and Management (repouching, etc.) PROCESS - Coordination of Care []  - 0 Simple Patient / Family Education for ongoing care []  - 0 Complex (extensive) Patient / Family Education for ongoing care []  - 0 Staff obtains Programmer, systems, Records, T Results / Process Orders est []  - 0 Staff telephones HHA, Nursing Homes / Clarify orders / etc []  - 0 Routine Transfer to another Facility (non-emergent condition) []  - 0 Routine Hospital Admission (non-emergent condition) []  - 0 New Admissions / Insurance Authorizations / Ordering NPWT Apligraf, etc. , []  - 0 Emergency Hospital Admission (emergent condition) Sendejo, [name] ([id number]) [id number] Page 2 of 7 PROCESS - Special Needs []  - 0 Pediatric / Minor Patient Management []  - 0 Isolation Patient Management []  - 0 Hearing / Language / Visual special needs []  - 0 Assessment of Community assistance (transportation, D/C planning, etc.) []  - 0 Additional assistance / Altered mentation []  - 0 Support Surface(s) Assessment (bed, cushion, seat, etc.) INTERVENTIONS - Miscellaneous []  - 0 External ear exam []  - 0 Patient Transfer (multiple staff / Civil Service fast streamer / Similar devices) []  - 0 Simple Staple / Suture removal (25 or less) []  - 0 Complex Staple / Suture removal (26 or more) []  - 0 Hypo/Hyperglycemic Management (do not check if billed separately) []  - 0 Ankle / Brachial Index (ABI) - do not check if billed separately Has the patient been seen at the hospital within the last three years: Yes Total Score: 0 Level Of Care: ____ Electronic Signature(s) Signed: 12/11/2022 4:12:46 PM  By: Rosalio Loud MSN RN CNS WTA Entered By: Rosalio Loud on 12/11/2022 13:00:16 -------------------------------------------------------------------------------- Compression Therapy Details Patient Name: Date of  Service: [name] 12/11/2022 12:30 PM Medical Record Number: [id number] Patient Account Number: [id number] Date of Birth/Sex: Treating RN: 28-Jun-1932 (89 y.o. [name] Primary Care Sherion Dooly: [name] Other Clinician: Referring Lulani Bour: Treating Shabana Armentrout/Extender: [name], [name] in Treatment: 9 Compression Therapy Performed for Wound Assessment: Wound #2 Right,Anterior Lower Leg Performed By: Clinician Rosalio Loud, RN Compression Type: Three Layer Post Procedure Diagnosis Same as Pre-procedure Electronic Signature(s) Signed: 12/11/2022 4:12:46 PM By: Rosalio Loud MSN RN CNS WTA Entered By: Rosalio Loud on 12/11/2022 13:05:25 -------------------------------------------------------------------------------- Encounter Discharge Information Details Patient Name: Date of Service: [name], [name] [place] 12/11/2022 12:30 PM Medical Record Number: [id number] Patient Account Number: [id number] Date of Birth/Sex: Treating RN: June 21, 1932 (89 y.o. [name] Primary Care Santiaga Butzin: [name] Other Clinician: Referring Gearldine Looney: Treating Yarenis Cerino/Extender: [name], [name] in Treatment: 9 Encounter Discharge Information Items Post Procedure Vitals Discharge Condition: Stable Temperature (F): 97.6 Ambulatory Status: Cane Pulse (bpm): 105 Discharge Destination: Home Respiratory Rate (breaths/min): 16 Transportation: Private Auto Blood Pressure (mmHg): 108/69 Accompanied By: self Schedule Follow-up Appointment: Yes Clinical Summary of CareAMEIA, DUDOIT ([id number]) [id number] Page 3 of 7 Electronic Signature(s) Signed: 12/11/2022 4:12:46 PM By: Rosalio Loud MSN RN CNS WTA Entered By: Rosalio Loud on 12/11/2022 13:05:57 -------------------------------------------------------------------------------- Lower Extremity Assessment Details Patient Name: Date of Service: [name], [name] 12/11/2022 12:30 PM Medical Record Number: [id number] Patient Account Number: [id number] Date of Birth/Sex: Treating RN: 1932/06/10 (89 y.o. [name] Primary Care Annasofia Pohl: [name] Other Clinician: Referring Leondre Taul: Treating Ethelyn Cerniglia/Extender: [name], [name] in Treatment: 9 Edema Assessment Assessed: [Left: No] [Right: Yes] [Left: Edema] [Right: :] Calf Left: Right: Point of Measurement: 34 cm From Medial Instep 30 cm Ankle Left: Right: Point of Measurement: 12 cm From Medial Instep 19 cm Vascular Assessment Pulses: Dorsalis Pedis Palpable: [Right:Yes] Electronic Signature(s) Signed: 12/11/2022 4:12:46 PM By: Rosalio Loud MSN RN CNS WTA Entered By: Rosalio Loud on 12/11/2022 12:45:43 -------------------------------------------------------------------------------- Multi Wound Chart Details Patient Name: Date of Service: [name], [name] 12/11/2022 12:30 PM Medical Record Number: [id number] Patient Account Number: [id number] Date of Birth/Sex: Treating RN: 1932-05-14 (89 y.o. [name] Primary Care Bonnee Zertuche: [name] Other Clinician: Referring Harriette Tovey: Treating Terrah Decoster/Extender: [name], [name] in Treatment: 9 Vital Signs Height(in): 68 Pulse(bpm): 105 Weight(lbs): 137 Blood Pressure(mmHg): 108/69 Body Mass Index(BMI): 20.8 Temperature(F): 97.6 Respiratory Rate(breaths/min): 16 [2:Photos:] [N/A:N/A] Right, Anterior Lower Leg N/A N/A Wound Location: JILLIANE, CAMANO ([id number]) [id number] Page 4 of 7 Skin T ear/Laceration N/A N/A Wounding Event: Skin T ear N/A N/A Primary Etiology: Cataracts, Arrhythmia, Osteoarthritis, N/A N/A Comorbid History: Neuropathy 08/30/2022 N/A N/A Date  Acquired: 9 N/A N/A Weeks of Treatment: Open N/A N/A Wound Status: No N/A N/A Wound Recurrence: 2.8x1.5x0.1 N/A N/A Measurements L x W x D (cm) 3.299 N/A N/A A (cm) : rea 0.33 N/A N/A Volume (cm) : 89.80% N/A N/A % Reduction in Area: 89.80% N/A N/A % Reduction in Volume: Full Thickness Without Exposed N/A N/A Classification: Support Structures Medium N/A N/A Exudate Amount: Serosanguineous N/A N/A Exudate Type: red, brown N/A N/A Exudate Color: Small (1-33%) N/A N/A Granulation Amount: Red, Pink N/A N/A Granulation Quality: Large (67-100%) N/A N/A Necrotic Amount: Fat Layer (Subcutaneous Tissue): Yes N/A N/A Exposed Structures: Fascia: No Tendon: No Muscle: No Joint: No Bone: No Small (1-33%) N/A N/A Epithelialization: Treatment Notes Electronic Signature(s) Signed: 12/11/2022 4:12:46 PM By:  Rosalio Loud MSN RN CNS WTA Entered By: Rosalio Loud on 12/11/2022 12:51:50 -------------------------------------------------------------------------------- Pain Assessment Details Patient Name: Date of Service: [name] 12/11/2022 12:30 PM Medical Record Number: [id number] Patient Account Number: [id number] Date of Birth/Sex: Treating RN: 09-05-1932 (89 y.o. [name] Primary Care Casaundra Takacs: [name] Other Clinician: Referring Apollo Timothy: Treating Kessler Solly/Extender: [name], [name] in Treatment: 9 Active Problems Location of Pain Severity and Description of Pain Patient Has Paino No Site Locations Pain Management and Medication Current Pain Management: Electronic Signature(s) Signed: 12/11/2022 4:12:46 PM By: Rosalio Loud MSN RN CNS [address], [place] ([id number]) PM By: Rosalio Loud MSN RN CNS Lissa Morales [phone].pdf Page 5 of 7 Signed: 12/11/2022 4:12:46 Entered By: Rosalio Loud on 12/11/2022 12:38:37 -------------------------------------------------------------------------------- Patient/Caregiver Education  Details Patient Name: Date of Service: [name], [name] 3/27/2024andnbsp12:30 PM Medical Record Number: [id number] Patient Account Number: [id number] Date of Birth/Gender: Treating RN: Apr 02, 1932 (89 y.o. [name] Primary Care Physician: [name] Other Clinician: Referring Physician: Treating Physician/Extender: Weyman Croon Weeks in Treatment: 9 Education Assessment Education Provided To: Patient Education Topics Provided Wound/Skin Impairment: Handouts: Caring for Your Ulcer Methods: Explain/Verbal Responses: State content correctly Electronic Signature(s) Signed: 12/11/2022 4:12:46 PM By: Rosalio Loud MSN RN CNS WTA Entered By: Rosalio Loud on 12/11/2022 13:03:39 -------------------------------------------------------------------------------- Wound Assessment Details Patient Name: Date of Service: [name], [name] 12/11/2022 12:30 PM Medical Record Number: [id number] Patient Account Number: [id number] Date of Birth/Sex: Treating RN: 10-05-1931 (89 y.o. [name] Primary Care Jamaree Hosier: [name] Other Clinician: Referring Andriel Omalley: Treating Shondrea Steinert/Extender: [name], [name] in Treatment: 9 Wound Status Wound Number: 2 Primary Etiology: Skin Tear Wound Location: Right, Anterior Lower Leg Wound Status: Open Wounding Event: Skin Tear/Laceration Comorbid History: Cataracts, Arrhythmia, Osteoarthritis, Neuropathy Date Acquired: 08/30/2022 Weeks Of Treatment: 9 Clustered Wound: No Photos Wound Measurements Length: (cm) 2. Width: (cm) 1. Depth: (cm) 0. Area: (cm) 3 Volume: (cm) 0 Paolillo, [name] ([id number]) Wound Description Classification: Full Thickness Without Exposed Suppor Exudate Amount: Medium Exudate Type: Serosanguineous Exudate Color: red, brown Foul Odor After Cleansing: Slough/Fibrino 8 % Reduction in Area: 89.8% 5 % Reduction in Volume: 89.8% 1 Epithelialization: Small  (1-33%) .299 .33 [id number] Page 6 of 7 t Structures No Yes Wound Bed Granulation Amount: Small (1-33%) Exposed Structure Granulation Quality: Red, Pink Fascia Exposed: No Necrotic Amount: Large (67-100%) Fat Layer (Subcutaneous Tissue) Exposed: Yes Necrotic Quality: Adherent Slough Tendon Exposed: No Muscle Exposed: No Joint Exposed: No Bone Exposed: No Treatment Notes Wound #2 (Lower Leg) Wound Laterality: Right, Anterior Cleanser Soap and Water Discharge Instruction: Gently cleanse wound with antibacterial soap, rinse and pat dry prior to dressing wounds Peri-Wound Care Topical Santyl Collagenase Ointment, 30 (gm), tube Discharge Instruction: apply nickel thick to wound bed only Primary Dressing Hydrofera Blue Ready Transfer Foam, 2.5x2.5 (in/in) Discharge Instruction: Apply Hydrofera Blue Ready to wound bed as directed Secondary Dressing ABD Pad 5x9 (in/in) Discharge Instruction: Cover with ABD pad Secured With Compression Wrap 3-LAYER WRAP - Profore Lite LF 3 Multilayer Compression Bandaging System Discharge Instruction: Apply 3 multi-layer wrap as prescribed. Compression Stockings Add-Ons Electronic Signature(s) Signed: 12/11/2022 4:12:46 PM By: Rosalio Loud MSN RN CNS WTA Entered By: Rosalio Loud on 12/11/2022 12:44:25 -------------------------------------------------------------------------------- Vitals Details Patient Name: Date of Service: [name], [name] 12/11/2022 12:30 PM Medical Record Number: [id number] Patient Account Number: [id number] Date of Birth/Sex: Treating RN: 10-07-1931 (89 y.o. [name]  Primary Care Jaqwan Wieber: [name] Other Clinician: Referring Hyacinth Marcelli: Treating Lyden Redner/Extender: [name], [name] in Treatment: 9 Vital Signs Time Taken: 12:35 Temperature (F): 97.6 Height (in): 68 Pulse (bpm): 105 Weight (lbs): 137 Respiratory Rate (breaths/min): 16 Body Mass Index (BMI):  20.8 Blood Pressure (mmHg): 108/69 Reference Range: 80 - 120 mg / dl Electronic Signature(s) Signed: 12/11/2022 4:12:46 PM By: Rosalio Loud MSN RN CNS [address] [name] ([id number]) [id number] Page 7 of 7 Entered By: Rosalio Loud on 12/11/2022 12:38:31

## 2022-12-11 NOTE — Progress Notes (Signed)
[name] [place] ([id number]) [id number] Page 1 of 7 Visit Report for 12/11/2022 Chief Complaint Document Details Patient Name: Date of Service: [name] [name], Mackenzie Dakota [name] 12/11/2022 12:30 PM Medical Record Number: [id number] Patient Account Number: [id number] Date of Birth/Sex: Treating RN: 1932/04/05 (89 y.o. [name] Primary Care Provider: [name] Other Clinician: Referring Provider: Treating Provider/Extender: Armandina [name], [name] Information Obtained from: Patient Chief Complaint 10/09/2022; right lower extremity wound Electronic Signature(s) Signed: 12/11/2022 2:26:16 PM By: Kalman Shan DO Entered By: Kalman Shan on 12/11/2022 13:00:40 -------------------------------------------------------------------------------- Debridement Details Patient Name: Date of Service: [name] [name], [name] 12/11/2022 12:30 PM Medical Record Number: [id number] Patient Account Number: [id number] Date of Birth/Sex: Treating RN: 09-Sep-1932 (89 y.o. [name], [name] Debridement Performed for Assessment: Wound #2 Right,Anterior Lower Leg Performed By: Physician Kalman Shan, MD Debridement Type: Debridement Level of Consciousness (Pre-procedure): Awake and Alert Pre-procedure Verification/Time Out Yes - 12:54 Taken: Start Time: 12:54 Pain Control: Lidocaine 4% T opical Solution T Area Debrided (L x W): otal 2.8 (cm) x 1.5 (cm) = 4.2 (cm) Tissue and other material debrided: Viable, Non-Viable, Slough, Slough Level: Non-Viable Tissue Debridement Description: Selective/Open Wound Instrument: Curette Bleeding: Minimum Hemostasis Achieved: Pressure Response to Treatment: Procedure was tolerated well Level of Consciousness (Post- Awake and Alert procedure): Post Debridement  Measurements of Total Wound Length: (cm) 2.8 Width: (cm) 1.5 Depth: (cm) 0.2 Volume: (cm) 0.66 Character of Wound/Ulcer Post Debridement: Stable Post Procedure Diagnosis Same as Pre-procedure Electronic Signature(s) Signed: 12/11/2022 2:26:16 PM By: Kalman Shan DO Signed: 12/11/2022 4:12:46 PM By: Rosalio Loud MSN RN CNS WTA Entered By: Rosalio Loud on 12/11/2022 [id number] [name] ([id number] Page 2 of 7 -------------------------------------------------------------------------------- HPI Details Patient Name: Date of Service: [name] [name], Mackenzie Dakota [name] 12/11/2022 12:30 PM Medical Record Number: [id number] Patient Account Number: [id number] Date of Birth/Sex: Treating RN: 1932-02-17 (89 y.o. [name] Primary Care Provider: [name] Other Clinician: Referring Provider: Treating Provider/Extender: Armandina [name], [name] History of Present Illness HPI Description: 05/08/2021 upon evaluation today patient appears for initial inspection here in the clinic concerning issues that she has been having actually with a wound over the anterior portion of her right leg. She tells me that this was due to an injury where she struck this on something causing the initial trauma and she has been having trouble getting it healed. [name] over at [name] has been helping take care of this. With that being said because it was not healing quite as quickly and effectively is what she wanted to see she did want [place] to take a look and make any recommendations to try to help things along. I am definitely happy to do that for sure. Fortunately there does not appear to be any signs of active infection at this time which is great news. The patient also does not seem to have too deep of the wound which is also good news. Her biggest concern is she really does not want to come weekly to the clinic especially since she has care at the [name] location as far as getting the dressing changed and keeping an eye on it. She does have a history of atrial fibrillation for which she is on warfarin and she does not like to do water aerobics twice a week although she  is not able to right now due to the fact that she has this wound and cannot get in the water. That is a significant issue for her at this point. Otherwise does have noncompressible pulses based on what we are seeing today. She wears compression socks for her chronic venous insufficiency she has had previous ablation therapy. [name]/13/2022 upon evaluation today patient appears to be doing well with regard to her wound. Overall this is actually showing signs of good improvement with significant overall decrease in size since last week even. Overall I feel like that she is doing awesome and making excellent progress. Over at [name] they been taking good care of her. [name]/29/2022 upon evaluation today patient appears to be doing excellent in regard to her wound. In fact she is completely healed based on what we are seeing currently. I do not see any signs of active infection at this time which is great news. 10/05/2022 [name] is an 89 year old female with a past medical history of chronic venous insufficiency and atrial fibrillation on warfarin that presents the clinic for a 1-19-month history of nonhealing ulcer to the anterior aspect of the right lower extremity. She states she hit her leg against an object in the beginning of December. She describes what started out as a hematoma and was debrided by a physician in her facility. She has been using Xeroform to the wound bed. She currently denies signs of infection. She has been wearing her compression stocking. 1/31; patient presents for follow-up. She has been using Hydrofera Blue and Santyl to the wound bed daily. There is been improvement in wound healing. She is scheduled for ABIs with TBI's today. She has no issues or  complaints today. She denies signs of infection. 2/7; patient presents for follow-up. She has been using Hydrofera Blue and Santyl to the wound bed. She had her ABIs with TBI's however the results have not been released. She has no issues or complaints today. 2/14; patient presents for follow-up. She has been using Hydrofera Blue and Santyl to the wound bed. Her ABI resulted and showed noncompressible on the right with a TBI of 0.88 with triphasic waveforms. 2/21; patient presents for follow-up. We have been using Hydrofera Blue and Santyl to the wound bed under 3 layer compression. She has no issues or complaints today. There is been improvement in wound healing. 2/28; patient presents for follow-up. We have been using Hydrofera Blue and Santyl to the wound bed under 3 layer compression to the right lower extremity. There continues to be improvement in wound healing. She has no issues or complaints today. 3/6; patient presents for follow-up. We have been using Hydrofera Blue and Santyl to the wound bed under 3 layer compression to the right lower extremity. She has no issues or complaints today. 3/20; patient presents for follow-up. We have been using Hydrofera Blue and Santyl to the wound bed under 3 layer compression. Wound is cleaner and smaller today. She has no issues or complaints today. She gets the wrap changed 2 additional times a week at her living facility. 3/27; patient presents for follow-up. We have been using Hydrofera Blue and Santyl to the wound bed under 3 layer compression. The wound is smaller today. Electronic Signature(s) Signed: 12/11/2022 2:26:16 PM By: Kalman Shan DO Entered By: Kalman Shan on 12/11/2022 13:02:11 -------------------------------------------------------------------------------- Physical Exam Details Patient Name: Date of Service: [name] [name], [name] 12/11/2022 12:30 PM Medical Record Number: [id number] Patient Account Number: [id number] Date  of  Birth/Sex: Treating RN: 04-03-1932 (89 y.o. [name] Primary Care Provider: [name] Other Clinician: Referring Provider: Treating Provider/Extender: Armandina [name], [name] Constitutional . Cardiovascular . ERMALENE, LEMANSKI [place] ([id number]) [id number] Page 3 of 7 Psychiatric . Notes Right lower extremity: T the anterior aspect there is an open wound with granulation tissue and slough. Good edema control. No surrounding signs of infection o including increased warmth, erythema or purulent drainage. Venous stasis dermatitis throughout the leg. Electronic Signature(s) Signed: 12/11/2022 2:26:16 PM By: Kalman Shan DO Entered By: Kalman Shan on 12/11/2022 13:04:30 -------------------------------------------------------------------------------- Physician Orders Details Patient Name: Date of Service: [name] [name], [name] 12/11/2022 12:30 PM Medical Record Number: [id number] Patient Account Number: [id number] Date of Birth/Sex: Treating RN: May 25, 1932 (89 y.o. [name], [name] Verbal / Phone Orders: No Diagnosis Coding Follow-up Appointments Return Appointment in 1 week. Nurse Visit as needed [employer] wounds with antibacterial soap and water. May shower with wound dressing protected with water repellent cover or cast protector. No tub bath. Anesthetic (Use 'Patient Medications' Section for Anesthetic Order Entry) Lidocaine applied to wound bed Edema Control - Lymphedema / Segmental Compressive Device / Other Right Lower Extremity 3 Layer Compression System for Lymphedema. Elevate, Exercise Daily and A void Standing for Long Periods of Time. Elevate legs to the level of the heart and pump ankles as often as possible Wound Treatment Wound #2 -  Lower Leg Wound Laterality: Right, Anterior Cleanser: Soap and Water 3 x Per Week/30 Days Discharge Instructions: Gently cleanse wound with antibacterial soap, rinse and pat dry prior to dressing wounds Topical: Santyl Collagenase Ointment, 30 (gm), tube 3 x Per Week/30 Days Discharge Instructions: apply nickel thick to wound bed only Prim Dressing: Hydrofera Blue Ready Transfer Foam, 2.5x2.5 (in/in) 3 x Per Week/30 Days ary Discharge Instructions: Apply Hydrofera Blue Ready to wound bed as directed Secondary Dressing: ABD Pad 5x9 (in/in) 3 x Per Week/30 Days Discharge Instructions: Cover with ABD pad Compression Wrap: 3-LAYER WRAP - Profore Lite LF 3 Multilayer Compression Bandaging System (Generic) 3 x Per Week/30 Days Discharge Instructions: Apply 3 multi-layer wrap as prescribed. Electronic Signature(s) Signed: 12/11/2022 2:26:16 PM By: Kalman Shan DO Entered By: Kalman Shan on 12/11/2022 13:19:32 -------------------------------------------------------------------------------- Problem List Details Patient Name: Date of Service: [name] [name], [name] 12/11/2022 12:30 PM Medical Record Number: [id number] Patient Account Number: [id number] Date of Birth/Sex: Treating RN: [birth year] (89 y.o. [name] Primary Care Provider: [name] Other Clinician: MAXIMINA, [name] ([id number]) [id number] Page 4 of 7 Referring Provider: Treating Provider/Extender: Armandina [name], [name] Active Problems ICD-10 Encounter Code Description Active Date MDM Diagnosis L97.812 Non-pressure chronic ulcer of other part of right lower leg with fat layer 10/09/2022 No Yes exposed I87.311 Chronic venous hypertension (idiopathic) with ulcer of right lower extremity 10/09/2022 No Yes I48.20 Chronic atrial fibrillation, unspecified 10/09/2022 No Yes Z79.01 Long term (current) use of anticoagulants 10/09/2022 No Yes Inactive Problems Resolved  Problems Electronic Signature(s) Signed: 12/11/2022 2:26:16 PM By: Kalman Shan DO Signed: 12/11/2022 4:12:46 PM By: Rosalio Loud MSN RN CNS WTA Entered By: Rosalio Loud on 12/11/2022 13:03:48 -------------------------------------------------------------------------------- Progress Note Details Patient Name: Date of Service: [name] [name], [name] 12/11/2022 12:30 PM Medical Record Number: [id number] Patient Account Number: [id number] Date of Birth/Sex: Treating RN: May 12, 1932 (89 y.o. F)  Cornell Barman Primary Care Provider: [name] Other Clinician: Referring Provider: Treating Provider/Extender: Armandina [name], [name] Subjective Chief Complaint Information obtained from Patient 10/09/2022; right lower extremity wound History of Present Illness (HPI) 05/08/2021 upon evaluation today patient appears for initial inspection here in the clinic concerning issues that she has been having actually with a wound over the anterior portion of her right leg. She tells me that this was due to an injury where she struck this on something causing the initial trauma and she has been having trouble getting it healed. [name] over at [facility] has been helping take care of this. With that being said because it was not healing quite as quickly and effectively is what she wanted to see she did want [place] to take a look and make any recommendations to try to help things along. I am definitely happy to do that for sure. Fortunately there does not appear to be any signs of active infection at this time which is great news. The patient also does not seem to have too deep of the wound which is also good news. Her biggest concern is she really does not want to come weekly to the clinic especially since she has care at the [facility] location as far as getting the dressing changed and keeping an eye on it. She does have a history of atrial fibrillation for which she is on warfarin and she does  not like to do water aerobics twice a week although she is not able to right now due to the fact that she has this wound and cannot get in the water. That is a significant issue for her at this point. Otherwise does have noncompressible pulses based on what we are seeing today. She wears compression socks for her chronic venous insufficiency she has had previous ablation therapy. [name]/13/2022 upon evaluation today patient appears to be doing well with regard to her wound. Overall this is actually showing signs of good improvement with significant overall decrease in size since last week even. Overall I feel like that she is doing awesome and making excellent progress. Over at [facility] they been taking good care of her. [name]/29/2022 upon evaluation today patient appears to be doing excellent in regard to her wound. In fact she is completely healed based on what we are seeing currently. I do not see any signs of active infection at this time which is great news. 10/05/2022 [name] is an 89 year old female with a past medical history of chronic venous insufficiency and atrial fibrillation on warfarin that presents the clinic for a 1-82-month history of nonhealing ulcer to the anterior aspect of the right lower extremity. She states she hit her leg against an object in the beginning of December. She describes what started out as a hematoma and was debrided by a physician in her facility. She has been using Xeroform to the wound bed. [name], [name] ([id number]) [id number] Page 5 of 7 She currently denies signs of infection. She has been wearing her compression stocking. 1/31; patient presents for follow-up. She has been using Hydrofera Blue and Santyl to the wound bed daily. There is been improvement in wound healing. She is scheduled for ABIs with TBI's today. She has no issues or complaints today. She denies signs of infection. 2/7; patient presents for follow-up. She  has been using Hydrofera Blue and Santyl to the wound bed. She had her ABIs with TBI's however the results have  not been released. She has no issues or complaints today. 2/14; patient presents for follow-up. She has been using Hydrofera Blue and Santyl to the wound bed. Her ABI resulted and showed noncompressible on the right with a TBI of 0.88 with triphasic waveforms. 2/21; patient presents for follow-up. We have been using Hydrofera Blue and Santyl to the wound bed under 3 layer compression. She has no issues or complaints today. There is been improvement in wound healing. 2/28; patient presents for follow-up. We have been using Hydrofera Blue and Santyl to the wound bed under 3 layer compression to the right lower extremity. There continues to be improvement in wound healing. She has no issues or complaints today. 3/6; patient presents for follow-up. We have been using Hydrofera Blue and Santyl to the wound bed under 3 layer compression to the right lower extremity. She has no issues or complaints today. 3/20; patient presents for follow-up. We have been using Hydrofera Blue and Santyl to the wound bed under 3 layer compression. Wound is cleaner and smaller today. She has no issues or complaints today. She gets the wrap changed 2 additional times a week at her living facility. 3/27; patient presents for follow-up. We have been using Hydrofera Blue and Santyl to the wound bed under 3 layer compression. The wound is smaller today. Objective Constitutional Vitals Time Taken: 12:35 PM, Height: 68 in, Weight: 137 lbs, BMI: 20.8, Temperature: 97.6 F, Pulse: 105 bpm, Respiratory Rate: 16 breaths/min, Blood Pressure: 108/69 mmHg. General Notes: Right lower extremity: T the anterior aspect there is an open wound with granulation tissue and slough. Good edema control. No surrounding o signs of infection including increased warmth, erythema or purulent drainage. Venous stasis dermatitis throughout the  leg. Integumentary (Hair, Skin) Wound #2 status is Open. Original cause of wound was Skin T ear/Laceration. The date acquired was: 08/30/2022. The wound has been in treatment [name] weeks. The wound is located on the Right,Anterior Lower Leg. The wound measures 2.8cm length x 1.5cm width x 0.1cm depth; 3.299cm^2 area and 0.33cm^3 volume. There is Fat Layer (Subcutaneous Tissue) exposed. There is a medium amount of serosanguineous drainage noted. There is small (1-33%) red, pink granulation within the wound bed. There is a large (67-100%) amount of necrotic tissue within the wound bed including Adherent Slough. Assessment Active Problems ICD-10 Non-pressure chronic ulcer of other part of right lower leg with fat layer exposed Chronic venous hypertension (idiopathic) with ulcer of right lower extremity Chronic atrial fibrillation, unspecified Long term (current) use of anticoagulants Patient's wound has shown improvement in size in appearance since last clinic visit. I debrided nonviable tissue. I recommended continuing the course with Santyl and Hydrofera Blue under 3 layer compression. Follow-up in 1 week For nurse visit and 2 weeks for physician visit. Procedures Wound #2 Pre-procedure diagnosis of Wound #2 is a Skin T located on the Right,Anterior Lower Leg . There was a Selective/Open Wound Non-Viable Tissue ear Debridement with a total area of 4.2 sq cm performed by Kalman Shan, MD. With the following instrument(s): Curette to remove Viable and Non-Viable tissue/material. Material removed includes [facility] after achieving pain control using Lidocaine 4% Topical Solution. No specimens were taken. A time out was conducted at 12:54, prior to the start of the procedure. A Minimum amount of bleeding was controlled with Pressure. The procedure was tolerated well. Post Debridement Measurements: 2.8cm length x 1.5cm width x 0.2cm depth; 0.66cm^3 volume. Character of Wound/Ulcer Post Debridement is  stable. Post procedure Diagnosis Wound #2:  Same as Pre-Procedure KAETLIN, LASHER ([id number]) [id number] Page 6 of 7 Plan Follow-up Appointments: Return Appointment in 1 week. Nurse Visit as needed Bathing/ Shower/ Hygiene: Wash wounds with antibacterial soap and water. May shower with wound dressing protected with water repellent cover or cast protector. No tub bath. Anesthetic (Use 'Patient Medications' Section for Anesthetic Order Entry): Lidocaine applied to wound bed Edema Control - Lymphedema / Segmental Compressive Device / Other: 3 Layer Compression System for Lymphedema. Elevate, Exercise Daily and Avoid Standing for Long Periods of Time. Elevate legs to the level of the heart and pump ankles as often as possible WOUND #2: - Lower Leg Wound Laterality: Right, Anterior Cleanser: Soap and Water 3 x Per Week/30 Days Discharge Instructions: Gently cleanse wound with antibacterial soap, rinse and pat dry prior to dressing wounds Topical: Santyl Collagenase Ointment, 30 (gm), tube 3 x Per Week/30 Days Discharge Instructions: apply nickel thick to wound bed only Prim Dressing: Hydrofera Blue Ready Transfer Foam, 2.5x2.5 (in/in) 3 x Per Week/30 Days ary Discharge Instructions: Apply Hydrofera Blue Ready to wound bed as directed Secondary Dressing: ABD Pad 5x9 (in/in) 3 x Per Week/30 Days Discharge Instructions: Cover with ABD pad Com pression Wrap: 3-LAYER WRAP - Profore Lite LF 3 Multilayer Compression Bandaging System (Generic) 3 x Per Week/30 Days Discharge Instructions: Apply 3 multi-layer wrap as prescribed. 1. In office sharp debridement 2. Hydrofera Blue with Santyl under 3 layer compressionooright lower extremity 3. Follow-up in 1 week Electronic Signature(s) Signed: 12/11/2022 2:26:16 PM By: Kalman Shan DO Entered By: Kalman Shan on 12/11/2022  13:19:08 -------------------------------------------------------------------------------- ROS/PFSH Details Patient Name: Date of Service: [name] [name], [name] 12/11/2022 12:30 PM Medical Record Number: [id number] Patient Account Number: [id number] Date of Birth/Sex: Treating RN: July 30, 1932 (89 y.o. [name] Primary Care Provider: [name] Other Clinician: Referring Provider: Treating Provider/Extender: Armandina [name], [name] Information Obtained From Patient Eyes Medical History: Positive for: Cataracts Cardiovascular Medical History: Positive for: Arrhythmia - Afib Endocrine Medical History: Negative for: Type I Diabetes; Type II Diabetes Musculoskeletal Medical History: Positive for: Osteoarthritis Neurologic Medical History: Positive for: Neuropathy HBO Extended History Items CASSEE, THORNES ([id number]) [id number] Page 7 of 7 Eyes: Cataracts Immunizations Pneumococcal Vaccine: Received Pneumococcal Vaccination: Yes Received Pneumococcal Vaccination On or After 60th Birthday: Yes Implantable Devices None Family and Social History Former smoker; Alcohol Use: Never; Drug Use: No History; Caffeine Use: Rarely Electronic Signature(s) Signed: 12/11/2022 2:26:16 PM By: Kalman Shan DO Signed: 12/11/2022 2:54:42 PM By: Gretta Cool, BSN, RN, CWS, Kim RN, BSN Entered By: Kalman Shan on 12/11/2022 13:20:03 -------------------------------------------------------------------------------- SuperBill Details Patient Name: Date of Service: [name] [name], [name] 12/11/2022 Medical Record Number: [id number] Patient Account Number: [id number] Date of Birth/Sex: Treating RN: July 08, 1932 (89 y.o. Charolette Forward, Kim Primary Care Provider: [name] Other Clinician: Referring Provider: Treating Provider/Extender: Armandina [name], [name] Diagnosis Coding ICD-10 Codes Code Description [phone]  Non-pressure chronic ulcer of other part of right lower leg with fat layer exposed I87.311 Chronic venous hypertension (idiopathic) with ulcer of right lower extremity I48.20 Chronic atrial fibrillation, unspecified Z79.01 Long term (current) use of anticoagulants Facility Procedures : CPT4 Code: [id number] Description: [id number] - DEBRIDE WOUND 1ST 20 SQ CM OR < ICD-10 Diagnosis Description L97.812 Non-pressure chronic ulcer of other part of right lower leg with fat layer expos I87.311 Chronic venous hypertension (idiopathic) with ulcer of right lower  extremity Modifier: ed Quantity: 1 Physician Procedures : CPT4 Code  Description Modifier [id number] - WC PHYS DEBR WO ANESTH 20 SQ CM ICD-10 Diagnosis Description [id number] Non-pressure chronic ulcer of other part of right lower leg with fat layer exposed I87.311 Chronic venous hypertension (idiopathic) with  ulcer of right lower extremity Quantity: 1 Electronic Signature(s) Signed: 12/11/2022 2:26:16 PM By: Kalman Shan DO Entered By: Kalman Shan on 12/11/2022 13:19:19

## 2022-12-19 ENCOUNTER — Other Ambulatory Visit: Payer: PPO

## 2022-12-19 ENCOUNTER — Other Ambulatory Visit (INDEPENDENT_AMBULATORY_CARE_PROVIDER_SITE_OTHER): Payer: PPO

## 2022-12-19 DIAGNOSIS — E038 Other specified hypothyroidism: Secondary | ICD-10-CM

## 2022-12-19 DIAGNOSIS — E782 Mixed hyperlipidemia: Secondary | ICD-10-CM

## 2022-12-19 LAB — LIPID PANEL
Cholesterol: 216 mg/dL — ABNORMAL HIGH (ref 0–200)
HDL: 78.4 mg/dL (ref >39.00)
LDL Cholesterol: 121 mg/dL — ABNORMAL HIGH (ref 0–99)
NonHDL: 137.4
Total CHOL/HDL Ratio: 3
Triglycerides: 82 mg/dL (ref 0.0–149.0)
VLDL: 16.4 mg/dL (ref 0.0–40.0)

## 2022-12-19 LAB — COMPREHENSIVE METABOLIC PANEL
ALT: 23 U/L (ref 0–35)
AST: 30 U/L (ref 0–37)
Albumin: 4.2 g/dL (ref 3.5–5.2)
Alkaline Phosphatase: 83 U/L (ref 39–117)
BUN: 15 mg/dL (ref 6–23)
CO2: 27 mEq/L (ref 19–32)
Calcium: 10.2 mg/dL (ref 8.4–10.5)
Chloride: 101 mEq/L (ref 96–112)
Creatinine, Ser: 0.65 mg/dL (ref 0.40–1.20)
GFR: 78.29 mL/min (ref >60.00)
Glucose, Bld: 72 mg/dL (ref 70–99)
Potassium: 4 mEq/L (ref 3.5–5.1)
Sodium: 137 mEq/L (ref 135–145)
Total Bilirubin: 0.8 mg/dL (ref 0.2–1.2)
Total Protein: 6.7 g/dL (ref 6.0–8.3)

## 2022-12-19 LAB — T3, FREE: T3, Free: 2.5 pg/mL (ref 2.3–4.2)

## 2022-12-19 LAB — TSH: TSH: 0.98 u[IU]/mL (ref 0.35–5.50)

## 2022-12-19 LAB — T4, FREE: Free T4: 1.32 ng/dL (ref 0.60–1.60)

## 2022-12-19 NOTE — Progress Notes (Signed)
No critical labs need to be addressed urgently. We will discuss labs in detail at upcoming office visit.   

## 2022-12-25 ENCOUNTER — Encounter: Payer: PPO | Attending: Internal Medicine | Admitting: Internal Medicine

## 2022-12-25 DIAGNOSIS — S81802A Unspecified open wound, left lower leg, initial encounter: Secondary | ICD-10-CM | POA: Insufficient documentation

## 2022-12-25 DIAGNOSIS — L97812 Non-pressure chronic ulcer of other part of right lower leg with fat layer exposed: Secondary | ICD-10-CM | POA: Insufficient documentation

## 2022-12-25 DIAGNOSIS — I87311 Chronic venous hypertension (idiopathic) with ulcer of right lower extremity: Secondary | ICD-10-CM | POA: Insufficient documentation

## 2022-12-25 DIAGNOSIS — Z09 Encounter for follow-up examination after completed treatment for conditions other than malignant neoplasm: Secondary | ICD-10-CM | POA: Diagnosis not present

## 2022-12-25 DIAGNOSIS — I482 Chronic atrial fibrillation, unspecified: Secondary | ICD-10-CM | POA: Diagnosis not present

## 2022-12-25 DIAGNOSIS — I872 Venous insufficiency (chronic) (peripheral): Secondary | ICD-10-CM | POA: Diagnosis not present

## 2022-12-25 DIAGNOSIS — Z7901 Long term (current) use of anticoagulants: Secondary | ICD-10-CM | POA: Insufficient documentation

## 2022-12-25 DIAGNOSIS — Z87891 Personal history of nicotine dependence: Secondary | ICD-10-CM | POA: Insufficient documentation

## 2022-12-25 DIAGNOSIS — M1990 Unspecified osteoarthritis, unspecified site: Secondary | ICD-10-CM | POA: Diagnosis not present

## 2022-12-25 DIAGNOSIS — X58XXXA Exposure to other specified factors, initial encounter: Secondary | ICD-10-CM | POA: Insufficient documentation

## 2022-12-26 ENCOUNTER — Encounter: Payer: Self-pay | Admitting: Family Medicine

## 2022-12-26 ENCOUNTER — Ambulatory Visit (INDEPENDENT_AMBULATORY_CARE_PROVIDER_SITE_OTHER): Payer: PPO | Admitting: Family Medicine

## 2022-12-26 VITALS — BP 108/70 | HR 86 | Temp 97.8°F | Ht 68.25 in | Wt 134.0 lb

## 2022-12-26 DIAGNOSIS — Z0000 Encounter for general adult medical examination without abnormal findings: Secondary | ICD-10-CM

## 2022-12-26 DIAGNOSIS — J432 Centrilobular emphysema: Secondary | ICD-10-CM | POA: Diagnosis not present

## 2022-12-26 DIAGNOSIS — E039 Hypothyroidism, unspecified: Secondary | ICD-10-CM | POA: Diagnosis not present

## 2022-12-26 DIAGNOSIS — I4891 Unspecified atrial fibrillation: Secondary | ICD-10-CM | POA: Diagnosis not present

## 2022-12-26 DIAGNOSIS — G629 Polyneuropathy, unspecified: Secondary | ICD-10-CM

## 2022-12-26 MED ORDER — WIXELA INHUB 250-50 MCG/ACT IN AEPB: 1.0000 | INHALATION_SPRAY | Freq: Two times a day (BID) | RESPIRATORY_TRACT | 3 refills | Status: DC

## 2022-12-26 NOTE — Assessment & Plan Note (Addendum)
Chronic, Controlled with Wixella 250/50 BID .  Followed by pulmonary Dr. Belia Heman.  Last office visit note reviewed

## 2022-12-26 NOTE — Assessment & Plan Note (Signed)
Improved control at night with gabapentin 300 mg p.o. nightly.    Followed in the past by neurology and podiatry.

## 2022-12-26 NOTE — Progress Notes (Signed)
Patient ID: [name], female    DOB: Mar 06, 1932, 89 y.o.   MRN: [id number]  This visit was conducted in person.  BP 108/70   Pulse 86   Temp 97.8 F (36.6 C) (Temporal)   Ht 5' 8.25" (1.734 m)   Wt 134 lb (60.8 kg)   SpO2 99%   BMI 20.23 kg/m    Rechecked oxygen in  99% CC: Chief Complaint  Patient presents with   Annual Exam    Part 2-MWV 12/04/2022    Subjective:   HPI: [name] is a 89 y.o. female presenting on 12/26/2022 for Annual Exam (Part 2-MWV 12/04/2022)  The patient presents for  complete physical and review of chronic health problems. He/She also has the following acute concerns today: none  The patient saw a LPN or RN for medicare wellness visit. 12/04/22  Prevention and wellness was reviewed in detail. Note reviewed and important notes copied below.  MOST form renewed and reviewed with patient in detail  COPD/emphysema: Controlled with Wixella  BID.  Followed by pulmonary Dr. Belia Heman  Last office visit note reviewed 05/17/22  Hypothyroidism stable control on levothyroxine 75 mcg daily Lab Results  Component Value Date   TSH 0.98 12/19/2022     Atrial fibrillation: Followed by Dr. Mariah Milling.  Last office visit note reviewed.  On Coumadin for anticoagulation  Chronic leg pain and neuropathy, Hammertoe, foot drop : Followed by neurology and podiatry.  On Neurontin with tolerable control, no pain in daytime.  History of venous insufficiency  Nml ABIs in 09/2022   Followed closely for wounds on legs.Marland Kitchen at [facility]... reviewed last OV note 4/10.Marland Kitchen improved and healing, but new wound on left leg.. small.  Pain is well controlled.    Elevated Cholesterol: LDL not at goal but given patient age she is not on a statin medication to avoid side effects. Lab Results  Component Value Date   CHOL 216 (H) 12/19/2022   HDL 78.40 12/19/2022   LDLCALC 121 (H) 12/19/2022   LDLDIRECT 126.5 07/08/2013   TRIG 82.0 12/19/2022   CHOLHDL 3 12/19/2022  Using  medications without problems: Muscle aches:  Diet compliance: Heart healthy. Exercise:  walking daily Other complaints: Wt Readings from Last 3 Encounters:  12/26/22 134 lb (60.8 kg)  12/04/22 140 lb (63.5 kg)  09/18/22 137 lb 3.2 oz (62.2 kg)  Body mass index is 20.23 kg/m.   Relevant past medical, surgical, family and social history reviewed and updated as indicated. Interim medical history since our last visit reviewed. Allergies and medications reviewed and updated. Outpatient Medications Prior to Visit  Medication Sig Dispense Refill   gabapentin (NEURONTIN) 100 MG capsule TAKE 3 CAPSULES(300 MG) BY MOUTH AT BEDTIME 270 capsule 1   ipratropium (ATROVENT) 0.03 % nasal spray Place 2 sprays into both nostrils 3 (three) times daily. 30 mL 2   levothyroxine (SYNTHROID) 75 MCG tablet TAKE 1 TABLET BY MOUTH 30 MINS BEFORE EATING WITH A FULL GLASS OF WATER 90 tablet 3   verapamil (CALAN-SR) 120 MG CR tablet TAKE 1 TABLET BY MOUTH EVERY DAY 90 tablet 3   warfarin (COUMADIN) 5 MG tablet TAKE 1 TABLET BY MOUTH DAILY EXCEPT TAKE 1/2 TABLET ON SUNDAYS AND THURSDAYS OR AS DIRECTED BY INR 90 tablet 1   SANTYL 250 UNIT/GM ointment Apply 1 Application topically at bedtime.     WIXELA INHUB 250-50 MCG/ACT AEPB Inhale 1 puff into the lungs in the morning and at bedtime.  fluticasone-salmeterol (WIXELA INHUB) 250-50 MCG/ACT AEPB Inhale 1 puff into the lungs in the morning and at bedtime.     No facility-administered medications prior to visit.     Per HPI unless specifically indicated in ROS section below Review of Systems  Constitutional:  Negative for fatigue and fever.  HENT:  Negative for congestion.   Eyes:  Negative for pain.  Respiratory:  Negative for cough and shortness of breath.   Cardiovascular:  Negative for chest pain, palpitations and leg swelling.  Gastrointestinal:  Negative for abdominal pain.  Genitourinary:  Negative for dysuria and vaginal bleeding.  Musculoskeletal:   Negative for back pain.  Neurological:  Negative for syncope, light-headedness and headaches.  Psychiatric/Behavioral:  Negative for dysphoric mood.    Objective:  BP 108/70   Pulse 86   Temp 97.8 F (36.6 C) (Temporal)   Ht 5' 8.25" (1.734 m)   Wt 134 lb (60.8 kg)   SpO2 99%   BMI 20.23 kg/m   Wt Readings from Last 3 Encounters:  12/26/22 134 lb (60.8 kg)  12/04/22 140 lb (63.5 kg)  09/18/22 137 lb 3.2 oz (62.2 kg)      Physical Exam Constitutional:      General: She is not in acute distress.    Appearance: Normal appearance. She is well-developed. She is not ill-appearing or toxic-appearing.  HENT:     Head: Normocephalic.     Right Ear: Hearing, tympanic membrane, ear canal and external ear normal. Tympanic membrane is not erythematous, retracted or bulging.     Left Ear: Hearing, tympanic membrane, ear canal and external ear normal. Tympanic membrane is not erythematous, retracted or bulging.     Nose: No mucosal edema or rhinorrhea.     Right Sinus: No maxillary sinus tenderness or frontal sinus tenderness.     Left Sinus: No maxillary sinus tenderness or frontal sinus tenderness.     Mouth/Throat:     Pharynx: Uvula midline.  Eyes:     General: Lids are normal. Lids are everted, no foreign bodies appreciated.     Conjunctiva/sclera: Conjunctivae normal.     Pupils: Pupils are equal, round, and reactive to light.  Neck:     Thyroid: No thyroid mass or thyromegaly.     Vascular: No carotid bruit.     Trachea: Trachea normal.  Cardiovascular:     Rate and Rhythm: Normal rate and regular rhythm.     Pulses: Normal pulses.     Heart sounds: Normal heart sounds, S1 normal and S2 normal. No murmur heard.    No friction rub. No gallop.  Pulmonary:     Effort: Pulmonary effort is normal. No tachypnea or respiratory distress.     Breath sounds: Normal breath sounds. No decreased breath sounds, wheezing, rhonchi or rales.  Abdominal:     General: Bowel sounds are normal.      Palpations: Abdomen is soft.     Tenderness: There is no abdominal tenderness.  Musculoskeletal:     Cervical back: Normal range of motion and neck supple.  Skin:    General: Skin is warm and dry.     Findings: No rash.  Neurological:     Mental Status: She is alert.  Psychiatric:        Mood and Affect: Mood is not anxious or depressed.        Speech: Speech normal.        Behavior: Behavior normal. Behavior is cooperative.  Thought Content: Thought content normal.        Judgment: Judgment normal.       Results for orders placed or performed in visit on 12/19/22  T3, Free  Result Value Ref Range   T3, Free 2.5 2.3 - 4.2 pg/mL  T4, free  Result Value Ref Range   Free T4 1.32 0.60 - 1.60 ng/dL  TSH  Result Value Ref Range   TSH 0.98 0.35 - 5.50 uIU/mL  Lipid panel  Result Value Ref Range   Cholesterol 216 (H) 0 - 200 mg/dL   Triglycerides 16.0 0.0 - 149.0 mg/dL   HDL 10.93 >23.55 mg/dL   VLDL 73.2 0.0 - 20.2 mg/dL   LDL Cholesterol 542 (H) 0 - 99 mg/dL   Total CHOL/HDL Ratio 3    NonHDL 137.40   Comprehensive metabolic panel  Result Value Ref Range   Sodium 137 135 - 145 mEq/L   Potassium 4.0 3.5 - 5.1 mEq/L   Chloride 101 96 - 112 mEq/L   CO2 27 19 - 32 mEq/L   Glucose, Bld 72 70 - 99 mg/dL   BUN 15 6 - 23 mg/dL   Creatinine, Ser 7.06 0.40 - 1.20 mg/dL   Total Bilirubin 0.8 0.2 - 1.2 mg/dL   Alkaline Phosphatase 83 39 - 117 U/L   AST 30 0 - 37 U/L   ALT 23 0 - 35 U/L   Total Protein 6.7 6.0 - 8.3 g/dL   Albumin 4.2 3.5 - 5.2 g/dL   GFR 23.76 >28.31 mL/min   Calcium 10.2 8.4 - 10.5 mg/dL    This visit occurred during the SARS-CoV-2 public health emergency.  Safety protocols were in place, including screening questions prior to the visit, additional usage of staff PPE, and extensive cleaning of exam room while observing appropriate contact time as indicated for disinfecting solutions.   COVID 19 screen:  No recent travel or known exposure to  COVID19 The patient denies respiratory symptoms of COVID 19 at this time. The importance of social distancing was discussed today.   Assessment and Plan The patient's preventative maintenance and recommended screening tests for an annual wellness exam were reviewed in full today. Brought up to date unless services declined.  Counselled on the importance of diet, exercise, and its role in overall health and mortality. The patient's FH and SH was reviewed, including their home life, tobacco status, and drug and alcohol status.   Vaccines:up-to-date with COVID ( recent booster 4/-2024), shingrix and pneumonia vaccine.  Next flu vaccine due in the fall.  Postpone tetanus given not covered by Medicare. Given age, no indication for mammogram, colonoscopy, Pap smear. DEXA: Last performed in 2013 showed osteopenia. She is not interested in  this at this time.  Problem List Items Addressed This Visit     Atrial fibrillation - Primary    Chronic, stable  Followed by Dr. Mariah Milling.  Last office visit note reviewed.  Rate controlled, asymptomatic.  On Coumadin for anticoagulation followed in our Coumadin clinic here      Centrilobular emphysema    Chronic, Controlled with Wixella 250/50 BID .  Followed by pulmonary Dr. Belia Heman.  Last office visit note reviewed      Relevant Medications   WIXELA INHUB 250-50 MCG/ACT AEPB   Hypothyroidism    Stable, chronic.  Continue current medication.   Levothyroxine 75 mcg daily      Polyneuropathy, unspecified    Improved control at night with gabapentin 300 mg p.o. nightly.  Followed in the past by neurology and podiatry.        Kerby NoraAmy Kelsay Haggard, MD

## 2022-12-26 NOTE — Assessment & Plan Note (Addendum)
Chronic, stable  Followed by Dr. Mariah Milling.  Last office visit note reviewed.  Rate controlled, asymptomatic.  On Coumadin for anticoagulation followed in our Coumadin clinic here

## 2022-12-26 NOTE — Progress Notes (Signed)
Mackenzie, [name] ([id number]) [id number] Page 1 of 8 Visit Report for 12/25/2022 Chief Complaint Document Details Patient Name: Date of Service: [name], [name] 12/25/2022 10:00 A M Medical Record Number: [id number] Patient Account Number: [id number] Date of Birth/Sex: Treating RN: [birth year] (89 y.o. [name] Primary Care Provider: Kerby Nora Other Clinician: Betha Loa Referring Provider: Treating Provider/Extender: Madie Reno, [name] in Treatment: 11 Information Obtained from: Patient Chief Complaint 10/09/2022; right lower extremity wound, 12/25/2022; left lower extremity wound Electronic Signature(s) Signed: 12/25/2022 11:59:33 AM By: Geralyn Corwin DO Entered By: Geralyn Corwin on 12/25/2022 10:53:25 -------------------------------------------------------------------------------- Debridement Details Patient Name: Date of Service: Mackenzie FF, [name] 12/25/2022 10:00 A M Medical Record Number: [id number] Patient Account Number: [id number] Date of Birth/Sex: Treating RN: [birth year] (89 y.o. [name], [name] Primary Care Provider: Kerby Nora Other Clinician: Betha Loa Referring Provider: Treating Provider/Extender: Madie Reno, [name] in Treatment: 11 Debridement Performed for Assessment: Wound #2 Right,Anterior Lower Leg Performed By: Physician Geralyn Corwin, MD Debridement Type: Debridement Level of Consciousness (Pre-procedure): Awake and Alert Pre-procedure Verification/Time Out Yes - 10:48 Taken: Start Time: 10:48 T Area Debrided (L x W): otal 2 (cm) x 1 (cm) = 2 (cm) Tissue and other material debrided: Viable, Non-Viable, Slough, Slough Level: Non-Viable Tissue Debridement Description: Selective/Open Wound Instrument: Curette Bleeding: Minimum Hemostasis Achieved: Pressure Response to Treatment: Procedure was tolerated well Level of Consciousness (Post- Awake and Alert procedure): Post  Debridement Measurements of Total Wound Length: (cm) 2 Width: (cm) 1 Depth: (cm) 0.1 Volume: (cm) 0.157 Character of Wound/Ulcer Post Debridement: Stable Post Procedure Diagnosis Same as Pre-procedure Electronic Signature(s) Signed: 12/25/2022 11:59:33 AM By: Geralyn Corwin DO Signed: 12/25/2022 4:41:17 PM By: Betha Loa Signed: 12/25/2022 5:50:22 PM By: Elliot Gurney, BSN, RN, CWS, Kim RN, BSN Entered By: Betha Loa on 12/25/2022 10:50:33 Andis, [name] ([id number]) [id number] Page 2 of 8 -------------------------------------------------------------------------------- HPI Details Patient Name: Date of Service: [name], [name] 12/25/2022 10:00 A M Medical Record Number: [id number] Patient Account Number: [id number] Date of Birth/Sex: Treating RN: 1932-06-21 (89 y.o. [name] Primary Care Provider: Kerby Nora Other Clinician: Betha Loa Referring Provider: Treating Provider/Extender: Madie Reno, [name] in Treatment: 11 History of Present Illness HPI Description: 05/08/2021 upon evaluation today patient appears for initial inspection here in the clinic concerning issues that she has been having actually with a wound over the anterior portion of her right leg. She tells me that this was due to an injury where she struck this on something causing the initial trauma and she has been having trouble getting it healed. [name] over at [facility] has been helping take care of this. With that being said because it was not healing quite as quickly and effectively is what she wanted to see she did want [place] to take a look and make any recommendations to try to help things along. I am definitely happy to do that for sure. Fortunately there does not appear to be any signs of active infection at this time which is great news. The patient also does not seem to have too deep of the wound which is also good news. Her biggest concern is she really  does not want to come weekly to the clinic especially since she has care at the [facility] location as far as getting the dressing changed and keeping an eye on it. She does have a history of atrial fibrillation for which she is on  warfarin and she does not like to do water aerobics twice a week although she is not able to right now due to the fact that she has this wound and cannot get in the water. That is a significant issue for her at this point. Otherwise does have noncompressible pulses based on what we are seeing today. She wears compression socks for her chronic venous insufficiency she has had previous ablation therapy. 05/29/2021 upon evaluation today patient appears to be doing well with regard to her wound. Overall this is actually showing signs of good improvement with significant overall decrease in size since last week even. Overall I feel like that she is doing awesome and making excellent progress. [facility] at [facility] they been taking good care of her. 06/14/2021 upon evaluation today patient appears to be doing excellent in regard to her wound. In fact she is completely healed based on what we are seeing currently. I do not see any signs of active infection at this time which is great news. 10/05/2022 [name] is an 89 year old female with a past medical history of chronic venous insufficiency and atrial fibrillation on warfarin that presents the clinic for a 09-7134-month history of nonhealing ulcer to the anterior aspect of the right lower extremity. She states she hit her leg against an object in the beginning of December. She describes what started out as a hematoma and was debrided by a physician in her facility. She has been using Xeroform to the wound bed. She currently denies signs of infection. She has been wearing her compression stocking. 1/31; patient presents for follow-up. She has been using Hydrofera Blue and Santyl to the wound bed daily. There is been improvement  in wound healing. She is scheduled for ABIs with TBI's today. She has no issues or complaints today. She denies signs of infection. 2/7; patient presents for follow-up. She has been using Hydrofera Blue and Santyl to the wound bed. She had her ABIs with TBI's however the results have not been released. She has no issues or complaints today. 2/14; patient presents for follow-up. She has been using Hydrofera Blue and Santyl to the wound bed. Her ABI resulted and showed noncompressible on the right with a TBI of 0.88 with triphasic waveforms. 2/21; patient presents for follow-up. We have been using Hydrofera Blue and Santyl to the wound bed under 3 layer compression. She has no issues or complaints today. There is been improvement in wound healing. 2/28; patient presents for follow-up. We have been using Hydrofera Blue and Santyl to the wound bed under 3 layer compression to the right lower extremity. There continues to be improvement in wound healing. She has no issues or complaints today. 3/6; patient presents for follow-up. We have been using Hydrofera Blue and Santyl to the wound bed under 3 layer compression to the right lower extremity. She has no issues or complaints today. 3/20; patient presents for follow-up. We have been using Hydrofera Blue and Santyl to the wound bed under 3 layer compression. Wound is cleaner and smaller today. She has no issues or complaints today. She gets the wrap changed 2 additional times a week at her living facility. 3/27; patient presents for follow-up. We have been using Hydrofera Blue and Santyl to the wound bed under 3 layer compression. The wound is smaller today. 4/10; patient presents for follow-up. We have been using Hydrofera Blue and Santyl to the right lower extremity wound under 3 layer compression. There is been improvement in healing.  Unfortunately she is developed a new wound to the left leg. She is not sure how it started she thinks and it  happened spontaneously. She has been keeping the area covered. Electronic Signature(s) Signed: 12/25/2022 11:59:33 AM By: Geralyn Corwin DO Entered By: Geralyn Corwin on 12/25/2022 10:54:01 -------------------------------------------------------------------------------- Physical Exam Details Patient Name: Date of Service: Mackenzie FF, [name] 12/25/2022 10:00 A M Medical Record Number: [id number] Patient Account Number: [id number] Date of Birth/Sex: Treating RN: 14-Aug-1932 (89 y.o. [name] Primary Care Provider: Kerby Nora Other Clinician: Betha Loa Referring Provider: Treating Provider/Extender: Madie Reno, [name] in Treatment: [address] [place], [place] [name] ([id number]) [id number] Page 3 of 8 Cardiovascular . Psychiatric . Notes Right lower extremity: T the anterior aspect there is an open wound with granulation tissue and slough. Good edema control. No surrounding signs of infection o including increased warmth, erythema or purulent drainage. Venous stasis dermatitis throughout the leg. T the left lower extremity there is a small circular o wound to the lateral distal aspect with granulation tissue. Significant hemosiderin staining throughout the leg. 2+ pitting edema to the knee. No signs of surrounding infection. Electronic Signature(s) Signed: 12/25/2022 11:59:33 AM By: Geralyn Corwin DO Entered By: Geralyn Corwin on 12/25/2022 10:54:49 -------------------------------------------------------------------------------- Physician Orders Details Patient Name: Date of Service: Mackenzie FF, [name] 12/25/2022 10:00 A M Medical Record Number: [id number] Patient Account Number: [id number] Date of Birth/Sex: Treating RN: Jun 02, 1932 (89 y.o. [name] Primary Care Provider: Kerby Nora Other Clinician: Betha Loa Referring Provider: Treating Provider/Extender: Rosalyn Gess Weeks in Treatment:  92 Verbal / Phone Orders: Yes Clinician: Huel Coventry Read Back and Verified: Yes Diagnosis Coding Follow-up Appointments Return Appointment in 1 week. Nurse Visit as needed [employer] wounds with antibacterial soap and water. May shower with wound dressing protected with water repellent cover or cast protector. No tub bath. Anesthetic (Use 'Patient Medications' Section for Anesthetic Order Entry) Lidocaine applied to wound bed Edema Control - Lymphedema / Segmental Compressive Device / Other Right Lower Extremity 3 Layer Compression System for Lymphedema. Elevate, Exercise Daily and A void Standing for Long Periods of Time. Elevate legs to the level of the heart and pump ankles as often as possible Wound Treatment Wound #2 - Lower Leg Wound Laterality: Right, Anterior Cleanser: Soap and Water 3 x Per Week/30 Days Discharge Instructions: Gently cleanse wound with antibacterial soap, rinse and pat dry prior to dressing wounds Prim Dressing: Hydrofera Blue Ready Transfer Foam, 2.5x2.5 (in/in) 3 x Per Week/30 Days ary Discharge Instructions: Apply Hydrofera Blue Ready to wound bed as directed Secondary Dressing: ABD Pad 5x9 (in/in) 3 x Per Week/30 Days Discharge Instructions: Cover with ABD pad Compression Wrap: Urgo K2 Lite, two layer compression system, regular 3 x Per Week/30 Days Wound #3 - Lower Leg Wound Laterality: Left, Lateral Cleanser: Soap and Water 3 x Per Week/30 Days Discharge Instructions: Gently cleanse wound with antibacterial soap, rinse and pat dry prior to dressing wounds Prim Dressing: Hydrofera Blue Ready Transfer Foam, 2.5x2.5 (in/in) 3 x Per Week/30 Days ary Discharge Instructions: Apply Hydrofera Blue Ready to wound bed as directed Secondary Dressing: (BORDER) Zetuvit Plus SILICONE BORDER Dressing 4x4 (in/in) 3 x Per Week/30 Days Discharge Instructions: Please do not put silicone bordered dressings under wraps. Use non-bordered dressing  only. Electronic Signature(s) JOSELYNE, [name] [place] ([id number]) [id number] Page 4 of 8 Signed: 12/25/2022 11:59:33 AM By: Geralyn Corwin DO Entered By: Geralyn Corwin on 12/25/2022 10:56:21 --------------------------------------------------------------------------------  Problem List Details Patient Name: Date of Service: [name], [name] 12/25/2022 10:00 A M Medical Record Number: [id number] Patient Account Number: [id number] Date of Birth/Sex: Treating RN: 04/16/1932 (89 y.o. [name], [name] Primary Care Provider: Kerby Nora Other Clinician: Betha Loa Referring Provider: Treating Provider/Extender: Madie Reno, [name] in Treatment: 58 Active Problems ICD-10 Encounter Code Description Active Date MDM Diagnosis L97.812 Non-pressure chronic ulcer of other part of right lower leg with fat layer 10/09/2022 No Yes exposed I87.311 Chronic venous hypertension (idiopathic) with ulcer of right lower extremity 10/09/2022 No Yes I48.20 Chronic atrial fibrillation, unspecified 10/09/2022 No Yes Z79.01 Long term (current) use of anticoagulants 10/09/2022 No Yes S81.802A Unspecified open wound, left lower leg, initial encounter 12/25/2022 No Yes Inactive Problems Resolved Problems Electronic Signature(s) Signed: 12/25/2022 11:59:33 AM By: Geralyn Corwin DO Entered By: Geralyn Corwin on 12/25/2022 10:53:10 -------------------------------------------------------------------------------- Progress Note Details Patient Name: Date of Service: Mackenzie FF, [name] 12/25/2022 10:00 A M Medical Record Number: [id number] Patient Account Number: [id number] Date of Birth/Sex: Treating RN: 03-25-1932 (89 y.o. [name] Primary Care Provider: Kerby Nora Other Clinician: Betha Loa Referring Provider: Treating Provider/Extender: Madie Reno, [name] in Treatment: 11 Subjective Chief Complaint Information obtained from  Patient 10/09/2022; right lower extremity wound, 12/25/2022; left lower extremity wound History of Present Illness (HPI) 05/08/2021 upon evaluation today patient appears for initial inspection here in the clinic concerning issues that she has been having actually with a wound over the anterior portion of her right leg. She tells me that this was due to an injury where she struck this on something causing the initial trauma and she has been having trouble getting it healed. [name] over at [facility] has been helping take care of this. With that being said because it was not healing quite as quickly and effectively is what she wanted to see she did want [place] to take a look and make any recommendations to try to help things along. I am definitely happy to do that for sure. Fortunately there does not appear to be any signs of active infection at this time which is great news. The patient also does not seem to have [name], [name] ([id number]) [id number] Page 5 of 8 too deep of the wound which is also good news. Her biggest concern is she really does not want to come weekly to the clinic especially since she has care at the [facility] location as far as getting the dressing changed and keeping an eye on it. She does have a history of atrial fibrillation for which she is on warfarin and she does not like to do water aerobics twice a week although she is not able to right now due to the fact that she has this wound and cannot get in the water. That is a significant issue for her at this point. Otherwise does have noncompressible pulses based on what we are seeing today. She wears compression socks for her chronic venous insufficiency she has had previous ablation therapy. 05/29/2021 upon evaluation today patient appears to be doing well with regard to her wound. Overall this is actually showing signs of good improvement with significant overall decrease in size since last week even.  Overall I feel like that she is doing awesome and making excellent progress. Over at [facility] they been taking good care of her. 06/14/2021 upon evaluation today patient appears to be doing excellent in regard to her wound. In fact she  is completely healed based on what we are seeing currently. I do not see any signs of active infection at this time which is great news. 10/05/2022 [name] is an 89 year old female with a past medical history of chronic venous insufficiency and atrial fibrillation on warfarin that presents the clinic for a 1-61-month history of nonhealing ulcer to the anterior aspect of the right lower extremity. She states she hit her leg against an object in the beginning of December. She describes what started out as a hematoma and was debrided by a physician in her facility. She has been using Xeroform to the wound bed. She currently denies signs of infection. She has been wearing her compression stocking. 1/31; patient presents for follow-up. She has been using Hydrofera Blue and Santyl to the wound bed daily. There is been improvement in wound healing. She is scheduled for ABIs with TBI's today. She has no issues or complaints today. She denies signs of infection. 2/7; patient presents for follow-up. She has been using Hydrofera Blue and Santyl to the wound bed. She had her ABIs with TBI's however the results have not been released. She has no issues or complaints today. 2/14; patient presents for follow-up. She has been using Hydrofera Blue and Santyl to the wound bed. Her ABI resulted and showed noncompressible on the right with a TBI of 0.88 with triphasic waveforms. 2/21; patient presents for follow-up. We have been using Hydrofera Blue and Santyl to the wound bed under 3 layer compression. She has no issues or complaints today. There is been improvement in wound healing. 2/28; patient presents for follow-up. We have been using Hydrofera Blue and Santyl to the  wound bed under 3 layer compression to the right lower extremity. There continues to be improvement in wound healing. She has no issues or complaints today. 3/6; patient presents for follow-up. We have been using Hydrofera Blue and Santyl to the wound bed under 3 layer compression to the right lower extremity. She has no issues or complaints today. 3/20; patient presents for follow-up. We have been using Hydrofera Blue and Santyl to the wound bed under 3 layer compression. Wound is cleaner and smaller today. She has no issues or complaints today. She gets the wrap changed 2 additional times a week at her living facility. 3/27; patient presents for follow-up. We have been using Hydrofera Blue and Santyl to the wound bed under 3 layer compression. The wound is smaller today. 4/10; patient presents for follow-up. We have been using Hydrofera Blue and Santyl to the right lower extremity wound under 3 layer compression. There is been improvement in healing. Unfortunately she is developed a new wound to the left leg. She is not sure how it started she thinks and it happened spontaneously. She has been keeping the area covered. Objective Constitutional Vitals Time Taken: 10:27 AM, Height: 68 in, Weight: 137 lbs, BMI: 20.8, Temperature: 98.2 F, Pulse: 90 bpm, Respiratory Rate: 16 breaths/min, Blood Pressure: 109/69 mmHg. General Notes: Right lower extremity: T the anterior aspect there is an open wound with granulation tissue and slough. Good edema control. No surrounding o signs of infection including increased warmth, erythema or purulent drainage. Venous stasis dermatitis throughout the leg. T the left lower extremity there is a o small circular wound to the lateral distal aspect with granulation tissue. Significant hemosiderin staining throughout the leg. 2+ pitting edema to the knee. No signs of surrounding infection. Integumentary (Hair, Skin) Wound #2 status is Open. Original cause of  wound was  Skin T ear/Laceration. The date acquired was: 08/30/2022. The wound has been in treatment 11 weeks. The wound is located on the Right,Anterior Lower Leg. The wound measures 2cm length x 1cm width x 0.1cm depth; 1.571cm^2 area and 0.157cm^3 volume. There is Fat Layer (Subcutaneous Tissue) exposed. There is a medium amount of serosanguineous drainage noted. There is small (1-33%) red, pink granulation within the wound bed. There is a large (67-100%) amount of necrotic tissue within the wound bed including Adherent Slough. Wound #3 status is Open. Original cause of wound was Shear/Friction. The date acquired was: 12/20/2022. The wound is located on the Left,Lateral Lower Leg. The wound measures 0.9cm length x 0.8cm width x 0.1cm depth; 0.565cm^2 area and 0.057cm^3 volume. There is Fat Layer (Subcutaneous Tissue) exposed. There is a small amount of serosanguineous drainage noted. There is small (1-33%) pink granulation within the wound bed. There is a small (1-33%) amount of necrotic tissue within the wound bed including Adherent Slough. Assessment Active Problems ICD-10 Non-pressure chronic ulcer of other part of right lower leg with fat layer exposed Chronic venous hypertension (idiopathic) with ulcer of right lower extremity Chronic atrial fibrillation, unspecified [name], [name] ([id number]) [id number] Page 6 of 8 Long term (current) use of anticoagulants Unspecified open wound, left lower leg, initial encounter Patient's right lower extremity wound has improved in size in appearance since last clinic visit. I debrided nonviable tissue. I recommended continuing the course with Hydrofera Blue under 3 layer compression. We will stop Santyl. T the left lower extremity she developed a spontaneous wound secondary to o venous insufficiency. No signs of infection. I offered an in office wrap however patient declined. She can use Hydrofera Blue with foam border dressing under  compression stockings daily. Follow-up in 1 week. Procedures Wound #2 Pre-procedure diagnosis of Wound #2 is a Skin T located on the Right,Anterior Lower Leg . There was a Selective/Open Wound Non-Viable Tissue ear Debridement with a total area of 2 sq cm performed by Geralyn Corwin, MD. With the following instrument(s): Curette to remove Viable and Non-Viable tissue/material. Material removed includes Slough. A time out was conducted at 10:48, prior to the start of the procedure. A Minimum amount of bleeding was controlled with Pressure. The procedure was tolerated well. Post Debridement Measurements: 2cm length x 1cm width x 0.1cm depth; 0.157cm^3 volume. Character of Wound/Ulcer Post Debridement is stable. Post procedure Diagnosis Wound #2: Same as Pre-Procedure Pre-procedure diagnosis of Wound #2 is a Skin T located on the Right,Anterior Lower Leg . There was a Double Layer Compression Therapy Procedure with a ear pre-treatment ABI of 0.8 by Betha Loa. Post procedure Diagnosis Wound #2: Same as Pre-Procedure Plan Follow-up Appointments: Return Appointment in 1 week. Nurse Visit as needed Bathing/ Shower/ Hygiene: Wash wounds with antibacterial soap and water. May shower with wound dressing protected with water repellent cover or cast protector. No tub bath. Anesthetic (Use 'Patient Medications' Section for Anesthetic Order Entry): Lidocaine applied to wound bed Edema Control - Lymphedema / Segmental Compressive Device / Other: 3 Layer Compression System for Lymphedema. Elevate, Exercise Daily and Avoid Standing for Long Periods of Time. Elevate legs to the level of the heart and pump ankles as often as possible WOUND #2: - Lower Leg Wound Laterality: Right, Anterior Cleanser: Soap and Water 3 x Per Week/30 Days Discharge Instructions: Gently cleanse wound with antibacterial soap, rinse and pat dry prior to dressing wounds Prim Dressing: Hydrofera Blue Ready Transfer Foam,  2.5x2.5 (in/in) 3 x  Per Week/30 Days ary Discharge Instructions: Apply Hydrofera Blue Ready to wound bed as directed Secondary Dressing: ABD Pad 5x9 (in/in) 3 x Per Week/30 Days Discharge Instructions: Cover with ABD pad Com pression Wrap: Urgo K2 Lite, two layer compression system, regular 3 x Per Week/30 Days WOUND #3: - Lower Leg Wound Laterality: Left, Lateral Cleanser: Soap and Water 3 x Per Week/30 Days Discharge Instructions: Gently cleanse wound with antibacterial soap, rinse and pat dry prior to dressing wounds Prim Dressing: Hydrofera Blue Ready Transfer Foam, 2.5x2.5 (in/in) 3 x Per Week/30 Days ary Discharge Instructions: Apply Hydrofera Blue Ready to wound bed as directed Secondary Dressing: (BORDER) Zetuvit Plus SILICONE BORDER Dressing 4x4 (in/in) 3 x Per Week/30 Days Discharge Instructions: Please do not put silicone bordered dressings under wraps. Use non-bordered dressing only. 1. In office sharp debridement 2. Hydrofera Blue under 3 layer compressionooright lower extremity 3. Hydrofera Blueooleft lower extremity 4. Compression stocking dailyooleft lower extremity 5. Follow-up in 1 week Electronic Signature(s) Signed: 12/25/2022 11:59:33 AM By: Geralyn Corwin DO Entered By: Geralyn Corwin on 12/25/2022 10:56:00 -------------------------------------------------------------------------------- ROS/PFSH Details Patient Name: Date of Service: Mackenzie FF, [name] 12/25/2022 10:00 A M Medical Record Number: [id number] Patient Account Number: [id number] [name], [name] ([id number]) [id number] Page 7 of 8 Date of Birth/Sex: Treating RN: [birth year] (89 y.o. [name] Primary Care Provider: Other Clinician: Woodroe [name] Referring Provider: Treating Provider/Extender: Madie Reno, [name] in Treatment: 11 Information Obtained From Patient Eyes Medical History: Positive for: Cataracts Cardiovascular Medical  History: Positive for: Arrhythmia - Afib Endocrine Medical History: Negative for: Type I Diabetes; Type II Diabetes Musculoskeletal Medical History: Positive for: Osteoarthritis Neurologic Medical History: Positive for: Neuropathy HBO Extended History Items Eyes: Cataracts Immunizations Pneumococcal Vaccine: Received Pneumococcal Vaccination: Yes Received Pneumococcal Vaccination On or After 60th Birthday: Yes Implantable Devices None Family and Social History Former smoker; Alcohol Use: Never; Drug Use: No History; Caffeine Use: Rarely Electronic Signature(s) Signed: 12/25/2022 11:59:33 AM By: Geralyn Corwin DO Signed: 12/25/2022 5:50:22 PM By: Elliot Gurney, BSN, RN, CWS, Kim RN, BSN Entered By: Geralyn Corwin on 12/25/2022 10:56:29 -------------------------------------------------------------------------------- SuperBill Details Patient Name: Date of Service: Mackenzie FF, [name] 12/25/2022 Medical Record Number: [id number] Patient Account Number: [id number] Date of Birth/Sex: Treating RN: 06-12-1932 (89 y.o. [name] Primary Care Provider: Kerby Nora Other Clinician: Betha Loa Referring Provider: Treating Provider/Extender: Madie Reno, [name] in Treatment: 11 Diagnosis Coding ICD-10 Codes Code Description [phone] Non-pressure chronic ulcer of other part of right lower leg with fat layer exposed I87.311 Chronic venous hypertension (idiopathic) with ulcer of right lower extremity I48.20 Chronic atrial fibrillation, unspecified Buscemi, [name] ([id number]) [id number] Page 8 of 8 Z79.01 Long term (current) use of anticoagulants S81.802A Unspecified open wound, left lower leg, initial encounter Facility Procedures : CPT4 Code: [id number] Description: [phone] - DEBRIDE WOUND 1ST 20 SQ CM OR < ICD-10 Diagnosis Description L97.812 Non-pressure chronic ulcer of other part of right lower leg with fat layer expos I87.311 Chronic  venous hypertension (idiopathic) with ulcer of right lower  extremity Modifier: ed Quantity: 1 Physician Procedures : CPT4 Code Description Modifier [id number] [id number] - WC PHYS DEBR WO ANESTH 20 SQ CM ICD-10 Diagnosis Description L97.812 Non-pressure chronic ulcer of other part of right lower leg with fat layer exposed I87.311 Chronic venous hypertension (idiopathic) with  ulcer of right lower extremity Quantity: 1 Electronic Signature(s) Signed: 12/25/2022 11:59:33 AM By: Geralyn Corwin DO Entered By: Geralyn Corwin on 12/25/2022 10:56:13

## 2022-12-26 NOTE — Progress Notes (Signed)
Mackenzie, [name] [place] ([id number]) [id number] Page 1 of 9 Visit Report for 12/25/2022 Arrival Information Details Patient Name: Date of Service: [name], [name] 12/25/2022 10:00 A M Medical Record Number: [id number] Patient Account Number: [id number] Date of Birth/Sex: Treating RN: 07-14-1932 (89 y.o. [name] Primary Care Carmellia Kreisler: Kerby Nora Other Clinician: Betha Loa Referring Aysha Livecchi: Treating Quintez Maselli/Extender: Madie Reno, [name] in Treatment: 11 Visit Information History Since Last Visit All ordered tests and consults were completed: No Patient Arrived: [name] Added or deleted any medications: No Arrival Time: 10:26 Any new allergies or adverse reactions: No Transfer Assistance: None Had a fall or experienced change in No Patient Identification Verified: Yes activities of daily living that may affect Secondary Verification Process Completed: Yes risk of falls: Patient Requires Transmission-Based Precautions: No Signs or symptoms of abuse/neglect since last visito No Patient Has Alerts: Yes Hospitalized since last visit: No Patient Alerts: Patient on Blood Thinner Implantable device outside of the clinic excluding No Warfarin cellular tissue based products placed in the center since last visit: Has Dressing in Place as Prescribed: Yes Has Compression in Place as Prescribed: Yes Pain Present Now: No Electronic Signature(s) Signed: 12/25/2022 4:41:17 PM By: Betha Loa Entered By: Betha Loa on 12/25/2022 10:27:35 -------------------------------------------------------------------------------- Clinic Level of Care Assessment Details Patient Name: Date of Service: [name], [name] 12/25/2022 10:00 A M Medical Record Number: [id number] Patient Account Number: [id number] Date of Birth/Sex: Treating RN: [birth year] (89 y.o. [name] Primary Care Oneika Simonian: Kerby Nora Other Clinician: Betha Loa Referring  Keshawna Dix: Treating Shailene Demonbreun/Extender: Madie Reno, [name] in Treatment: 11 Clinic Level of Care Assessment Items TOOL 1 Quantity Score []  - 0 Use when EandM and Procedure is performed on INITIAL visit ASSESSMENTS - Nursing Assessment / Reassessment []  - 0 General Physical Exam (combine w/ comprehensive assessment (listed just below) when performed on new pt. evals) []  - 0 Comprehensive Assessment (HX, ROS, Risk Assessments, Wounds Hx, etc.) ASSESSMENTS - Wound and Skin Assessment / Reassessment []  - 0 Dermatologic / Skin Assessment (not related to wound area) ASSESSMENTS - Ostomy and/or Continence Assessment and Care []  - 0 Incontinence Assessment and Management []  - 0 Ostomy Care Assessment and Management (repouching, etc.) PROCESS - Coordination of Care []  - 0 Simple Patient / Family Education for ongoing care []  - 0 Complex (extensive) Patient / Family Education for ongoing care []  - 0 Staff obtains Chiropractor, Records, T Results / Process Orders est []  - 0 Staff telephones HHA, Nursing Homes / Clarify orders / etc []  - 0 Routine Transfer to another Facility (non-emergent condition) []  - 0 Routine Hospital Admission (non-emergent condition) [name], Germaine [name] ([id number]) [id number] Page 2 of 9 []  - 0 New Admissions / Manufacturing engineer / Ordering NPWT Apligraf, etc. , []  - 0 Emergency Hospital Admission (emergent condition) PROCESS - Special Needs []  - 0 Pediatric / Minor Patient Management []  - 0 Isolation Patient Management []  - 0 Hearing / Language / Visual special needs []  - 0 Assessment of Community assistance (transportation, D/C planning, etc.) []  - 0 Additional assistance / Altered mentation []  - 0 Support Surface(s) Assessment (bed, cushion, seat, etc.) INTERVENTIONS - Miscellaneous []  - 0 External ear exam []  - 0 Patient Transfer (multiple staff / Nurse, adult / Similar devices) []  - 0 Simple Staple /  Suture removal (25 or less) []  - 0 Complex Staple / Suture removal (26 or more) []  - 0 Hypo/Hyperglycemic Management (do not check if billed  separately) []  - 0 Ankle / Brachial Index (ABI) - do not check if billed separately Has the patient been seen at the hospital within the last three years: Yes Total Score: 0 Level Of Care: ____ Electronic Signature(s) Signed: 12/25/2022 4:41:17 PM By: Betha Loa Entered By: Betha Loa on 12/25/2022 10:52:47 -------------------------------------------------------------------------------- Compression Therapy Details Patient Name: Date of Service: [name] 12/25/2022 10:00 A M Medical Record Number: [id number] Patient Account Number: [id number] Date of Birth/Sex: Treating RN: [birth year] (89 y.o. [name] Primary Care [name]: Kerby Nora Other Clinician: Betha Loa Referring Colman Birdwell: Treating [name]/Extender: Madie Reno, [name] in Treatment: 11 Compression Therapy Performed for Wound Assessment: Wound #2 Right,Anterior Lower Leg Performed By: Farrel Gordon, Angie, Compression Type: Double Layer Pre Treatment ABI: 0.8 Post Procedure Diagnosis Same as Pre-procedure Electronic Signature(s) Signed: 12/25/2022 4:41:17 PM By: Betha Loa Entered By: Betha Loa on 12/25/2022 10:49:41 -------------------------------------------------------------------------------- Encounter Discharge Information Details Patient Name: Date of Service: Mackenzie FF, MA RY JA [name] 12/25/2022 10:00 A M Medical Record Number: [id number] Patient Account Number: [id number] Date of Birth/Sex: Treating RN: 1931-11-03 (89 y.o. [name] Primary Care Hershell Brandl: Kerby Nora Other Clinician: Betha Loa Referring [name]: Treating Renell Allum/Extender: Madie Reno, [name] in Treatment: 2 Encounter Discharge Information Items Post Procedure Vitals Discharge Condition: Stable Temperature (F): 98.2 Ambulatory  Status: Cane Pulse (bpm): 90 Discharge Destination: Home Respiratory Rate (breaths/min): 16 [name], Mackenzie JANE ([id number]) [id number] Page 3 of 9 Transportation: Other Blood Pressure (mmHg): 109/69 Accompanied By: self Schedule Follow-up Appointment: Yes Clinical Summary of Care: Electronic Signature(s) Signed: 12/25/2022 4:41:17 PM By: Betha Loa Entered By: Betha Loa on 12/25/2022 14:04:21 -------------------------------------------------------------------------------- Lower Extremity Assessment Details Patient Name: Date of Service: [name], [name] 12/25/2022 10:00 A M Medical Record Number: [id number] Patient Account Number: [id number] Date of Birth/Sex: Treating RN: 1932-05-16 (89 y.o. [name] Primary Care Jeryn Cerney: Kerby Nora Other Clinician: Betha Loa Referring Paulino Cork: Treating Jushua Waltman/Extender: Madie Reno, [name] in Treatment: 11 Edema Assessment Assessed: [Left: Yes] [Right: Yes] Edema: [Left: Yes] [Right: Yes] Calf Left: Right: Point of Measurement: 34 cm From Medial Instep 30.7 cm 29 cm Ankle Left: Right: Point of Measurement: 12 cm From Medial Instep 19.8 cm 19.2 cm Vascular Assessment Pulses: Dorsalis Pedis Palpable: [Left:Yes] [Right:Yes] Electronic Signature(s) Signed: 12/25/2022 4:41:17 PM By: Betha Loa Signed: 12/25/2022 5:50:22 PM By: Elliot Gurney, BSN, RN, CWS, Kim RN, BSN Entered By: Betha Loa on 12/25/2022 10:40:54 -------------------------------------------------------------------------------- Multi Wound Chart Details Patient Name: Date of Service: Mackenzie FF, MA RY JA [name] 12/25/2022 10:00 A M Medical Record Number: [id number] Patient Account Number: [id number] Date of Birth/Sex: Treating RN: Oct 04, 1931 (89 y.o. [name] Primary Care Lashawn Orrego: Kerby Nora Other Clinician: Betha Loa Referring Beatriz Quintela: Treating Jarrah Babich/Extender: Madie Reno, [name] in  Treatment: 11 Vital Signs Height(in): 68 Pulse(bpm): 90 Weight(lbs): 137 Blood Pressure(mmHg): 109/69 Body Mass Index(BMI): 20.8 Temperature(F): 98.2 Respiratory Rate(breaths/min): 16 [2:Photos:] [N/A:N/A [id number] Page 4 of 9] Right, Anterior Lower Leg Left, Lateral Lower Leg N/A Wound Location: Skin T ear/Laceration Shear/Friction N/A Wounding Event: Skin T ear Venous Leg Ulcer N/A Primary Etiology: Cataracts, Arrhythmia, Osteoarthritis, Cataracts, Arrhythmia, Osteoarthritis, N/A Comorbid History: Neuropathy Neuropathy 08/30/2022 12/20/2022 N/A Date Acquired: 11 0 N/A Weeks of Treatment: Open Open N/A Wound Status: No No N/A Wound Recurrence: 2x1x0.1 0.9x0.8x0.1 N/A Measurements L x W x D (cm) 1.571 0.565 N/A A (cm) : rea 0.157 0.057 N/A Volume (cm) : 95.20% N/A N/A % Reduction  in Area: 95.20% N/A N/A % Reduction in Volume: Full Thickness Without Exposed Partial Thickness N/A Classification: Support Structures Medium Small N/A Exudate Amount: Serosanguineous Serosanguineous N/A Exudate Type: red, brown red, brown N/A Exudate Color: Small (1-33%) Small (1-33%) N/A Granulation Amount: Red, Pink Pink N/A Granulation Quality: Large (67-100%) Small (1-33%) N/A Necrotic Amount: Fat Layer (Subcutaneous Tissue): Yes Fat Layer (Subcutaneous Tissue): Yes N/A Exposed Structures: Fascia: No Fascia: No Tendon: No Tendon: No Muscle: No Muscle: No Joint: No Joint: No Bone: No Bone: No Small (1-33%) N/A N/A Epithelialization: Treatment Notes Electronic Signature(s) Signed: 12/25/2022 4:41:17 PM By: Betha Loa Entered By: Betha Loa on 12/25/2022 10:41:25 -------------------------------------------------------------------------------- Multi-Disciplinary Care Plan Details Patient Name: Date of Service: Mackenzie FF, MA Wyona Almas [name] 12/25/2022 10:00 A M Medical Record Number: [id number] Patient Account Number: [id number] Date of  Birth/Sex: Treating RN: 07/27/1932 (89 y.o. Cathlean Cower, Kim Primary Care Gerene Nedd: Kerby Nora Other Clinician: Betha Loa Referring Sekai Gitlin: Treating Ansh Fauble/Extender: Madie Reno, [name] in Treatment: 11 Active Inactive Necrotic Tissue Nursing Diagnoses: Impaired tissue integrity related to necrotic/devitalized tissue Knowledge deficit related to management of necrotic/devitalized tissue Goals: Necrotic/devitalized tissue will be minimized in the wound bed Date Initiated: 10/09/2022 Target Resolution Date: 11/09/2022 Goal Status: Active Patient/caregiver will verbalize understanding of reason and process for debridement of necrotic tissue Date Initiated: 10/09/2022 Target Resolution Date: 11/09/2022 Goal Status: Active Interventions: Assess patient pain level pre-, during and post procedure and prior to discharge Provide education on necrotic tissue and debridement process Treatment Activities: Apply topical anesthetic as ordered : 10/09/2022 Excisional debridement : 10/09/2022 DANYLA, SIELING [place] ([id number]) [id number] Page 5 of 9 Notes: Venous Leg Ulcer Nursing Diagnoses: Actual venous Insuffiency (use after diagnosis is confirmed) Knowledge deficit related to disease process and management Goals: Patient will maintain optimal edema control Date Initiated: 10/09/2022 Target Resolution Date: 11/09/2022 Goal Status: Active Patient/caregiver will verbalize understanding of disease process and disease management Date Initiated: 10/09/2022 Target Resolution Date: 11/09/2022 Goal Status: Active Verify adequate tissue perfusion prior to therapeutic compression application Date Initiated: 10/09/2022 Target Resolution Date: 11/09/2022 Goal Status: Active Interventions: Assess peripheral edema status every visit. Provide education on venous insufficiency Treatment Activities: T ordered outside of clinic : 10/09/2022 est Notes: Wound/Skin  Impairment Nursing Diagnoses: Impaired tissue integrity Knowledge deficit related to ulceration/compromised skin integrity Goals: Patient will have a decrease in wound volume by X% from date: (specify in notes) Date Initiated: 10/09/2022 Target Resolution Date: 11/09/2022 Goal Status: Active Patient/caregiver will verbalize understanding of skin care regimen Date Initiated: 10/09/2022 Target Resolution Date: 11/09/2022 Goal Status: Active Ulcer/skin breakdown will have a volume reduction of 30% by week 4 Date Initiated: 10/09/2022 Target Resolution Date: 11/09/2022 Goal Status: Active Ulcer/skin breakdown will have a volume reduction of 50% by week 8 Date Initiated: 10/09/2022 Target Resolution Date: 12/08/2022 Goal Status: Active Ulcer/skin breakdown will have a volume reduction of 80% by week 12 Date Initiated: 10/09/2022 Target Resolution Date: 01/08/2023 Goal Status: Active Ulcer/skin breakdown will heal within 14 weeks Date Initiated: 10/09/2022 Target Resolution Date: 01/22/2023 Goal Status: Active Interventions: Assess patient/caregiver ability to obtain necessary supplies Assess patient/caregiver ability to perform ulcer/skin care regimen upon admission and as needed Assess ulceration(s) every visit Provide education on ulcer and skin care Treatment Activities: Skin care regimen initiated : 10/09/2022 Notes: Electronic Signature(s) Signed: 12/25/2022 4:41:17 PM By: Betha Loa Signed: 12/25/2022 5:50:22 PM By: Elliot Gurney, BSN, RN, CWS, Kim RN, BSN Entered By: Betha Loa on 12/25/2022 11:07:36 -------------------------------------------------------------------------------- Pain Assessment Details Patient Name: Date  of Service: Mackenzie FF, MA RY JA [name] 12/25/2022 10:00 A PHYLLICIA, DUDEK Nathalie ([id number]) [id number] Page 6 of 9 Medical Record Number: [id number] Patient Account Number: [id number] Date of Birth/Sex: Treating RN: August 11, 1932 (89 y.o. [name] Primary Care Donel Osowski: Kerby Nora Other Clinician: Betha Loa Referring Jeffrey Voth: Treating Challen Spainhour/Extender: Madie Reno, [name] in Treatment: 66 Active Problems Location of Pain Severity and Description of Pain Patient Has Paino No Site Locations Pain Management and Medication Current Pain Management: Electronic Signature(s) Signed: 12/25/2022 4:41:17 PM By: Betha Loa Signed: 12/25/2022 5:50:22 PM By: Elliot Gurney, BSN, RN, CWS, Kim RN, BSN Entered By: Betha Loa on 12/25/2022 10:29:26 -------------------------------------------------------------------------------- Patient/Caregiver Education Details Patient Name: Date of Service: Mackenzie FF, MA Wyona Almas [name] 4/10/2024andnbsp10:00 A M Medical Record Number: [id number] Patient Account Number: [id number] Date of Birth/Gender: Treating RN: 16-Feb-1932 (89 y.o. [name] Primary Care Physician: Kerby Nora Other Clinician: Betha Loa Referring Physician: Treating Physician/Extender: Rosalyn Gess Weeks in Treatment: 11 Education Assessment Education Provided To: Patient Education Topics Provided Venous: Handouts: Controlling Swelling with Compression Stockings Methods: Explain/Verbal Responses: State content correctly Wound/Skin Impairment: Handouts: Other: continue wound care as directed Methods: Explain/Verbal Responses: State content correctly Electronic Signature(s) Signed: 12/25/2022 4:41:17 PM By: Betha Loa Entered By: Betha Loa on 12/25/2022 14:03:04 Robb, Germaine [name] ([id number]) [id number] Page 7 of 9 -------------------------------------------------------------------------------- Wound Assessment Details Patient Name: Date of Service: [name], [name] 12/25/2022 10:00 A M Medical Record Number: [id number] Patient Account Number: [id number] Date of Birth/Sex: Treating RN: Sep 02, 1932 (89 y.o. Cathlean Cower, Kim Primary Care Libertie Hausler: Kerby Nora Other Clinician: Betha Loa Referring Dave Mannes: Treating Ilias Stcharles/Extender: Madie Reno, [name] in Treatment: 11 Wound Status Wound Number: 2 Primary Etiology: Skin Tear Wound Location: Right, Anterior Lower Leg Wound Status: Open Wounding Event: Skin Tear/Laceration Comorbid History: Cataracts, Arrhythmia, Osteoarthritis, Neuropathy Date Acquired: 08/30/2022 Weeks Of Treatment: 11 Clustered Wound: No Photos Wound Measurements Length: (cm) 2 Width: (cm) 1 Depth: (cm) 0.1 Area: (cm) 1.571 Volume: (cm) 0.157 % Reduction in Area: 95.2% % Reduction in Volume: 95.2% Epithelialization: Small (1-33%) Wound Description Classification: Full Thickness Without Exposed Support Structures Exudate Amount: Medium Exudate Type: Serosanguineous Exudate Color: red, brown Foul Odor After Cleansing: No Slough/Fibrino Yes Wound Bed Granulation Amount: Small (1-33%) Exposed Structure Granulation Quality: Red, Pink Fascia Exposed: No Necrotic Amount: Large (67-100%) Fat Layer (Subcutaneous Tissue) Exposed: Yes Necrotic Quality: Adherent Slough Tendon Exposed: No Muscle Exposed: No Joint Exposed: No Bone Exposed: No Treatment Notes Wound #2 (Lower Leg) Wound Laterality: Right, Anterior Cleanser Soap and Water Discharge Instruction: Gently cleanse wound with antibacterial soap, rinse and pat dry prior to dressing wounds Peri-Wound Care Topical Primary Dressing Hydrofera Blue Ready Transfer Foam, 2.5x2.5 (in/in) Discharge Instruction: Apply Hydrofera Blue Ready to wound bed as directed Secondary Dressing ABD Pad 5x9 (in/in) Discharge Instruction: Cover with ABD pad Klauer, Germaine [name] ([id number]) [id number].pdf Page 8 of 9 Secured With Compression Wrap Urgo K2 Lite, two layer compression system, regular Compression Stockings Add-Ons Electronic Signature(s) Signed: 12/25/2022 4:41:17 PM By: Betha Loa Signed: 12/25/2022 5:50:22 PM  By: Elliot Gurney, BSN, RN, CWS, Kim RN, BSN Entered By: Betha Loa on 12/25/2022 10:39:20 -------------------------------------------------------------------------------- Wound Assessment Details Patient Name: Date of Service: Mackenzie FF, MA RY JA [name] 12/25/2022 10:00 A M Medical Record Number: [id number] Patient Account Number: [id number] Date of Birth/Sex: Treating RN: [birth year] (89 y.o. [name] Primary Care Illene Sweeting: Kerby Nora Other Clinician: Betha Loa Referring Montague Corella: Treating Orella Cushman/Extender: Geralyn Corwin  Bedsole, [name] in Treatment: 11 Wound Status Wound Number: 3 Primary Etiology: Venous Leg Ulcer Wound Location: Left, Lateral Lower Leg Wound Status: Open Wounding Event: Shear/Friction Comorbid History: Cataracts, Arrhythmia, Osteoarthritis, Neuropathy Date Acquired: 12/20/2022 Weeks Of Treatment: 0 Clustered Wound: No Photos Wound Measurements Length: (cm) Width: (cm) Depth: (cm) Area: (cm) Volume: (cm) 0.9 % Reduction in Area: 0.8 % Reduction in Volume: 0.1 0.565 0.057 Wound Description Classification: Partial Thickness Exudate Amount: Small Exudate Type: Serosanguineous Exudate Color: red, brown Foul Odor After Cleansing: No Slough/Fibrino Yes Wound Bed Granulation Amount: Small (1-33%) Exposed Structure Granulation Quality: Pink Fascia Exposed: No Necrotic Amount: Small (1-33%) Fat Layer (Subcutaneous Tissue) Exposed: Yes Necrotic Quality: Adherent Slough Tendon Exposed: No Muscle Exposed: No Joint Exposed: No Bone Exposed: No Treatment Notes Wound #3 (Lower Leg) Wound Laterality: Left, Lateral Reitz, Germaine PomfretMARY JANE ([id number]) [id number] [id number] Page 9 of 9 Cleanser Soap and Water Discharge Instruction: Gently cleanse wound with antibacterial soap, rinse and pat dry prior to dressing wounds Peri-Wound Care Topical Primary Dressing Hydrofera Blue Ready Transfer Foam, 2.5x2.5 (in/in) Discharge Instruction: Apply  Hydrofera Blue Ready to wound bed as directed Secondary Dressing (BORDER) Zetuvit Plus SILICONE BORDER Dressing 4x4 (in/in) Discharge Instruction: Please do not put silicone bordered dressings under wraps. Use non-bordered dressing only. Secured With Compression Wrap Compression Stockings Facilities managerAdd-Ons Electronic Signature(s) Signed: 12/25/2022 4:41:17 PM By: Betha LoaVenable, Angie Signed: 12/25/2022 5:50:22 PM By: Elliot GurneyWoody, BSN, RN, CWS, Kim RN, BSN Entered By: Betha LoaVenable, Angie on 12/25/2022 10:38:56 -------------------------------------------------------------------------------- Vitals Details Patient Name: Date of Service: Mackenzie FF, MA RY JA [name] 12/25/2022 10:00 A M Medical Record Number: [id number] Patient Account Number: [id number] Date of Birth/Sex: Treating RN: 11/19/1931 (89 y.o. Cathlean CowerF) Woody, Kim Primary Care Nancy Manuele: Kerby NoraBedsole, Amy Other Clinician: Betha LoaVenable, Angie Referring Belina Mandile: Treating Marcy Sookdeo/Extender: Madie RenoHoffman, Jessica Bedsole, [name] in Treatment: 11 Vital Signs Time Taken: 10:27 Temperature (F): 98.2 Height (in): 68 Pulse (bpm): 90 Weight (lbs): 137 Respiratory Rate (breaths/min): 16 Body Mass Index (BMI): 20.8 Blood Pressure (mmHg): 109/69 Reference Range: 80 - 120 mg / dl Electronic Signature(s) Signed: 12/25/2022 4:41:17 PM By: Betha LoaVenable, Angie Entered By: Betha LoaVenable, Angie on 12/25/2022 10:29:21

## 2022-12-26 NOTE — Assessment & Plan Note (Signed)
Stable, chronic.  Continue current medication. ? ? ?Levothyroxine 75 mcg daily ?

## 2023-01-01 ENCOUNTER — Encounter (HOSPITAL_BASED_OUTPATIENT_CLINIC_OR_DEPARTMENT_OTHER): Payer: PPO | Admitting: Internal Medicine

## 2023-01-01 DIAGNOSIS — S81802A Unspecified open wound, left lower leg, initial encounter: Secondary | ICD-10-CM

## 2023-01-01 DIAGNOSIS — L97812 Non-pressure chronic ulcer of other part of right lower leg with fat layer exposed: Secondary | ICD-10-CM

## 2023-01-01 DIAGNOSIS — I87312 Chronic venous hypertension (idiopathic) with ulcer of left lower extremity: Secondary | ICD-10-CM

## 2023-01-02 ENCOUNTER — Ambulatory Visit (INDEPENDENT_AMBULATORY_CARE_PROVIDER_SITE_OTHER): Payer: PPO

## 2023-01-02 DIAGNOSIS — I482 Chronic atrial fibrillation, unspecified: Secondary | ICD-10-CM | POA: Diagnosis not present

## 2023-01-02 DIAGNOSIS — Z7901 Long term (current) use of anticoagulants: Secondary | ICD-10-CM | POA: Diagnosis not present

## 2023-01-02 DIAGNOSIS — D802 Selective deficiency of immunoglobulin A [IgA]: Secondary | ICD-10-CM | POA: Diagnosis not present

## 2023-01-02 LAB — POCT INR: INR: 1.8 — AB (ref 2.0–3.0)

## 2023-01-02 NOTE — Patient Instructions (Addendum)
Pre visit review using our clinic review tool, if applicable. No additional management support is needed unless otherwise documented below in the visit note.  Increase dose today to take 1 tablet and then continue 1 tablet daily except take 1/2 tablet on Sundays and Thursdays. Recheck in 3 weeks, on 01/23/23.

## 2023-01-02 NOTE — Progress Notes (Signed)
Mackenzie, KUJALA [name] ([id number]) [id number] Page 1 of 9 Visit Report for 01/01/2023 Arrival Information Details Patient Name: Date of Service: [name], [name] 01/01/2023 8:30 A M Medical Record Number: [id number] Patient Account Number: [id number] Date of Birth/Sex: Treating RN: 03-08-1932 (89 y.o. [name] Primary Care Kahmari Koller: Kerby Nora Other Clinician: Referring Loletha Bertini: Treating Alhaji Mcneal/Extender: Madie Reno, [name] in Treatment: 12 Visit Information History Since Last Visit Added or deleted any medications: No Patient Arrived: Cane Has Dressing in Place as Prescribed: Yes Arrival Time: 08:30 Pain Present Now: No Accompanied By: self Transfer Assistance: None Patient Identification Verified: Yes Secondary Verification Process Completed: Yes Patient Requires Transmission-Based Precautions: No Patient Has Alerts: Yes Patient Alerts: Patient on Blood Thinner Warfarin Electronic Signature(s) Signed: 01/01/2023 4:41:30 PM By: Midge Aver MSN RN CNS WTA Entered By: Midge Aver on 01/01/2023 09:18:27 -------------------------------------------------------------------------------- Clinic Level of Care Assessment Details Patient Name: Date of Service: [name], [name] 01/01/2023 8:30 A M Medical Record Number: [id number] Patient Account Number: [id number] Date of Birth/Sex: Treating RN: [birth year] (89 y.o. [name] Primary Care Broedy Osbourne: Kerby Nora Other Clinician: Referring Rick Warnick: Treating Taggert Bozzi/Extender: Madie Reno, [name] in Treatment: 12 Clinic Level of Care Assessment Items TOOL 1 Quantity Score []  - 0 Use when EandM and Procedure is performed on INITIAL visit ASSESSMENTS - Nursing Assessment / Reassessment []  - 0 General Physical Exam (combine w/ comprehensive assessment (listed just below) when performed on new pt. evals) []  - 0 Comprehensive Assessment (HX, ROS, Risk Assessments,  Wounds Hx, etc.) ASSESSMENTS - Wound and Skin Assessment / Reassessment []  - 0 Dermatologic / Skin Assessment (not related to wound area) ASSESSMENTS - Ostomy and/or Continence Assessment and Care []  - 0 Incontinence Assessment and Management []  - 0 Ostomy Care Assessment and Management (repouching, etc.) PROCESS - Coordination of Care []  - 0 Simple Patient / Family Education for ongoing care []  - 0 Complex (extensive) Patient / Family Education for ongoing care []  - 0 Staff obtains Chiropractor, Records, T Results / Process Orders est []  - 0 Staff telephones HHA, Nursing Homes / Clarify orders / etc []  - 0 Routine Transfer to another Facility (non-emergent condition) []  - 0 Routine Hospital Admission (non-emergent condition) []  - 0 New Admissions / Insurance Authorizations / Ordering NPWT Apligraf, etc. , []  - 0 Emergency Hospital Admission (emergent condition) [place], [name] ([id number]) [id number] Page 2 of 9 PROCESS - Special Needs []  - 0 Pediatric / Minor Patient Management []  - 0 Isolation Patient Management []  - 0 Hearing / Language / Visual special needs []  - 0 Assessment of Community assistance (transportation, D/C planning, etc.) []  - 0 Additional assistance / Altered mentation []  - 0 Support Surface(s) Assessment (bed, cushion, seat, etc.) INTERVENTIONS - Miscellaneous []  - 0 External ear exam []  - 0 Patient Transfer (multiple staff / Nurse, adult / Similar devices) []  - 0 Simple Staple / Suture removal (25 or less) []  - 0 Complex Staple / Suture removal (26 or more) []  - 0 Hypo/Hyperglycemic Management (do not check if billed separately) []  - 0 Ankle / Brachial Index (ABI) - do not check if billed separately Has the patient been seen at the hospital within the last three years: Yes Total Score: 0 Level Of Care: ____ Electronic Signature(s) Signed: 01/01/2023 4:41:30 PM By: Midge Aver MSN RN CNS WTA Entered By: Midge Aver  on 01/01/2023 08:55:29 -------------------------------------------------------------------------------- Compression Therapy Details Patient Name: Date of Service: [name], [name] 01/01/2023 8:30 A M Medical Record Number: [id number] Patient Account Number: [id number] Date of Birth/Sex: Treating RN: December 18, 1931 (89 y.o. [name] Primary Care Lynnley Doddridge: Kerby Nora Other Clinician: Referring Zanyia Silbaugh: Treating Tanish Sinkler/Extender: Madie Reno, [name] in Treatment: 12 Compression Therapy Performed for Wound Assessment: Wound #2 Right,Anterior Lower Leg Performed By: Clinician Midge Aver, RN Compression Type: Three Layer Post Procedure Diagnosis Same as Pre-procedure Electronic Signature(s) Signed: 01/01/2023 4:40:49 PM By: Midge Aver MSN RN CNS WTA Entered By: Midge Aver on 01/01/2023 16:40:49 -------------------------------------------------------------------------------- Encounter Discharge Information Details Patient Name: Date of Service: [name], [name] 01/01/2023 8:30 A M Medical Record Number: [id number] Patient Account Number: [id number] Date of Birth/Sex: Treating RN: [birth year] (89 y.o. [name] Primary Care Ashe Graybeal: Kerby Nora Other Clinician: Referring Porscha Axley: Treating Mayela Bullard/Extender: Madie Reno, [name] in Treatment: 12 Encounter Discharge Information Items Post Procedure Vitals Discharge Condition: Stable Temperature (F): 98.1 Ambulatory Status: Cane Pulse (bpm): 106 Discharge Destination: Home Respiratory Rate (breaths/min): 16 Transportation: Private Auto Blood Pressure (mmHg): 123/86 Accompanied By: self Schedule Follow-up Appointment: Yes Clinical Summary of Care: Mackenzie, [name] ([id number]) [id number] Page 3 of 9 Electronic Signature(s) Signed: 01/01/2023 4:41:11 PM By: Midge Aver MSN RN CNS WTA Entered By: Midge Aver on 01/01/2023  16:41:11 -------------------------------------------------------------------------------- Lower Extremity Assessment Details Patient Name: Date of Service: [name], [name] 01/01/2023 8:30 A M Medical Record Number: [id number] Patient Account Number: [id number] Date of Birth/Sex: Treating RN: 07-Nov-1931 (89 y.o. [name] Primary Care Brittiny Levitz: Kerby Nora Other Clinician: Referring Andrius Andrepont: Treating Alasha Mcguinness/Extender: Madie Reno, [name] in Treatment: 12 Edema Assessment Assessed: [Left: Yes] [Right: Yes] [Left: Edema] [Right: :] Calf Left: Right: Point of Measurement: 34 cm From Medial Instep 30 cm 29 cm Ankle Left: Right: Point of Measurement: 12 cm From Medial Instep 19 cm 19 cm Vascular Assessment Pulses: Dorsalis Pedis Palpable: [Left:Yes] [Right:Yes] Electronic Signature(s) Signed: 01/01/2023 4:41:30 PM By: Midge Aver MSN RN CNS WTA Entered By: Midge Aver on 01/01/2023 08:45:43 -------------------------------------------------------------------------------- Multi Wound Chart Details Patient Name: Date of Service: [name], [name] 01/01/2023 8:30 A M Medical Record Number: [id number] Patient Account Number: [id number] Date of Birth/Sex: Treating RN: 26-May-1932 (89 y.o. [name] Primary Care Fredrico Beedle: Kerby Nora Other Clinician: Referring Onie Hayashi: Treating Aleesa Sweigert/Extender: Madie Reno, [name] in Treatment: 12 Vital Signs Height(in): 68 Pulse(bpm): 106 Weight(lbs): 137 Blood Pressure(mmHg): 123/86 Body Mass Index(BMI): 20.8 Temperature(F): 98.1 Respiratory Rate(breaths/min): 16 [2:Photos:] [N/A:N/A] Right, Anterior Lower Leg Left, Lateral Lower Leg N/A Wound Location: TARRY, BLAYNEY ([id number]) [id number] Page 4 of 9 Skin T ear/Laceration Shear/Friction N/A Wounding Event: Skin T ear Venous Leg Ulcer N/A Primary Etiology: Cataracts, Arrhythmia, Osteoarthritis, Cataracts,  Arrhythmia, Osteoarthritis, N/A Comorbid History: Neuropathy Neuropathy 08/30/2022 12/20/2022 N/A Date Acquired: 12 1 N/A Weeks of Treatment: Open Open N/A Wound Status: No No N/A Wound Recurrence: 1.2x0.4x0.1 0.8x0.7x0.1 N/A Measurements L x W x D (cm) 0.377 0.44 N/A A (cm) : rea 0.038 0.044 N/A Volume (cm) : 98.80% 22.10% N/A % Reduction in Area: 98.80% 22.80% N/A % Reduction in Volume: Full Thickness Without Exposed Partial Thickness N/A Classification: Support Structures Medium Small N/A Exudate Amount: Serosanguineous Serosanguineous N/A Exudate Type: red, brown red, brown N/A Exudate Color: Small (1-33%) Small (1-33%) N/A Granulation Amount: Red, Pink Pink N/A Granulation Quality: Large (67-100%) Small (1-33%) N/A Necrotic Amount: Fat Layer (Subcutaneous Tissue): Yes Fat Layer (Subcutaneous Tissue): Yes N/A Exposed Structures: Fascia: No Fascia: No  Tendon: No Tendon: No Muscle: No Muscle: No Joint: No Joint: No Bone: No Bone: No Small (1-33%) N/A N/A Epithelialization: Treatment Notes Electronic Signature(s) Signed: 01/01/2023 4:41:30 PM By: Midge Aver MSN RN CNS WTA Entered By: Midge Aver on 01/01/2023 08:53:08 -------------------------------------------------------------------------------- Multi-Disciplinary Care Plan Details Patient Name: Date of Service: [name], [name] 01/01/2023 8:30 A M Medical Record Number: [id number] Patient Account Number: [id number] Date of Birth/Sex: Treating RN: 1932-01-27 (89 y.o. [name] Primary Care Dontavious Emily: Kerby Nora Other Clinician: Referring Selisa Tensley: Treating Cristol Engdahl/Extender: Madie Reno, [name] in Treatment: 12 Active Inactive Necrotic Tissue Nursing Diagnoses: Impaired tissue integrity related to necrotic/devitalized tissue Knowledge deficit related to management of necrotic/devitalized tissue Goals: Necrotic/devitalized tissue will be minimized in the wound bed Date  Initiated: 10/09/2022 Date Inactivated: 01/01/2023 Target Resolution Date: 11/09/2022 Goal Status: Met Patient/caregiver will verbalize understanding of reason and process for debridement of necrotic tissue Date Initiated: 10/09/2022 Target Resolution Date: 01/14/2023 Goal Status: Active Interventions: Assess patient pain level pre-, during and post procedure and prior to discharge Provide education on necrotic tissue and debridement process Treatment Activities: Apply topical anesthetic as ordered : 10/09/2022 Excisional debridement : 10/09/2022 Notes: Venous Leg Ulcer Nursing Diagnoses: KEANU, FRICKEY ([id number]) [id number] Page 5 of 9 Actual venous Insuffiency (use after diagnosis is confirmed) Knowledge deficit related to disease process and management Goals: Patient will maintain optimal edema control Date Initiated: 10/09/2022 Date Inactivated: 01/01/2023 Target Resolution Date: 11/09/2022 Goal Status: Met Patient/caregiver will verbalize understanding of disease process and disease management Date Initiated: 10/09/2022 Date Inactivated: 01/01/2023 Target Resolution Date: 11/09/2022 Goal Status: Met Verify adequate tissue perfusion prior to therapeutic compression application Date Initiated: 10/09/2022 Target Resolution Date: 02/15/2023 Goal Status: Active Interventions: Assess peripheral edema status every visit. Provide education on venous insufficiency Treatment Activities: T ordered outside of clinic : 10/09/2022 est Notes: Wound/Skin Impairment Nursing Diagnoses: Impaired tissue integrity Knowledge deficit related to ulceration/compromised skin integrity Goals: Patient will have a decrease in wound volume by X% from date: (specify in notes) Date Initiated: 10/09/2022 Date Inactivated: 01/01/2023 Target Resolution Date: 11/09/2022 Goal Status: Met Patient/caregiver will verbalize understanding of skin care regimen Date Initiated: 10/09/2022 Date  Inactivated: 01/01/2023 Target Resolution Date: 11/09/2022 Goal Status: Met Ulcer/skin breakdown will have a volume reduction of 30% by week 4 Date Initiated: 10/09/2022 Date Inactivated: 01/01/2023 Target Resolution Date: 11/09/2022 Goal Status: Met Ulcer/skin breakdown will have a volume reduction of 50% by week 8 Date Initiated: 10/09/2022 Target Resolution Date: 01/14/2023 Goal Status: Active Ulcer/skin breakdown will have a volume reduction of 80% by week 12 Date Initiated: 10/09/2022 Target Resolution Date: 02/14/2023 Goal Status: Active Ulcer/skin breakdown will heal within 14 weeks Date Initiated: 10/09/2022 Target Resolution Date: 03/16/2023 Goal Status: Active Interventions: Assess patient/caregiver ability to obtain necessary supplies Assess patient/caregiver ability to perform ulcer/skin care regimen upon admission and as needed Assess ulceration(s) every visit Provide education on ulcer and skin care Treatment Activities: Skin care regimen initiated : 10/09/2022 Notes: Electronic Signature(s) Signed: 01/01/2023 4:41:30 PM By: Midge Aver MSN RN CNS WTA Entered By: Midge Aver on 01/01/2023 08:56:54 -------------------------------------------------------------------------------- Pain Assessment Details Patient Name: Date of Service: [name], [name] 01/01/2023 8:30 A M Medical Record Number: [id number] Patient Account Number: [id number] Date of Birth/Sex: Treating RN: 1931-11-10 (89 y.o. [name] Primary Care Deone Omahoney: Kerby Nora Other Clinician: Referring Heela Heishman: Treating Aicia Babinski/Extender: Madie Reno, [name] in Treatment: [address], [place] ([id number]) [id number] Page 6 of 9 Active Problems  Location of Pain Severity and Description of Pain Patient Has Paino No Site Locations Pain Management and Medication Current Pain Management: Electronic Signature(s) Signed: 01/01/2023 4:41:30 PM By: Midge Aver MSN RN CNS  WTA Entered By: Midge Aver on 01/01/2023 08:33:50 -------------------------------------------------------------------------------- Patient/Caregiver Education Details Patient Name: Date of Service: [name], MA Wyona [place] 4/17/2024andnbsp8:30 A M Medical Record Number: [id number] Patient Account Number: [id number] Date of Birth/Gender: Treating RN: 09/05/1932 (89 y.o. [name] Primary Care Physician: Kerby Nora Other Clinician: Referring Physician: Treating Physician/Extender: Rosalyn Gess Weeks in Treatment: 12 Education Assessment Education Provided To: Patient Education Topics Provided Wound/Skin Impairment: Handouts: Caring for Your Ulcer Methods: Explain/Verbal Responses: State content correctly Electronic Signature(s) Signed: 01/01/2023 4:41:30 PM By: Midge Aver MSN RN CNS WTA Entered By: Midge Aver on 01/01/2023 08:57:06 -------------------------------------------------------------------------------- Wound Assessment Details Patient Name: Date of Service: [name], [name] 01/01/2023 8:30 A M Medical Record Number: [id number] Patient Account Number: [id number] Date of Birth/Sex: Treating RN: [birth year] (89 y.o. [name] Primary Care Kyo Cocuzza: Kerby Nora Other Clinician: Referring Katalia Choma: Treating Dava Rensch/Extender: Madie Reno, [name] in Treatment: [address], [place] ([id number]) [id number] Page 7 of 9 Wound Status Wound Number: 2 Primary Etiology: Skin Tear Wound Location: Right, Anterior Lower Leg Wound Status: Open Wounding Event: Skin Tear/Laceration Comorbid History: Cataracts, Arrhythmia, Osteoarthritis, Neuropathy Date Acquired: 08/30/2022 Weeks Of Treatment: 12 Clustered Wound: No Photos Wound Measurements Length: (cm) 1.2 Width: (cm) 0.4 Depth: (cm) 0.1 Area: (cm) 0.377 Volume: (cm) 0.038 % Reduction in Area: 98.8% % Reduction in Volume: 98.8% Epithelialization: Small  (1-33%) Wound Description Classification: Full Thickness Without Exposed Support Structures Exudate Amount: Medium Exudate Type: Serosanguineous Exudate Color: red, brown Foul Odor After Cleansing: No Slough/Fibrino Yes Wound Bed Granulation Amount: Small (1-33%) Exposed Structure Granulation Quality: Red, Pink Fascia Exposed: No Necrotic Amount: Large (67-100%) Fat Layer (Subcutaneous Tissue) Exposed: Yes Necrotic Quality: Adherent Slough Tendon Exposed: No Muscle Exposed: No Joint Exposed: No Bone Exposed: No Treatment Notes Wound #2 (Lower Leg) Wound Laterality: Right, Anterior Cleanser Soap and Water Discharge Instruction: Gently cleanse wound with antibacterial soap, rinse and pat dry prior to dressing wounds Peri-Wound Care Topical Primary Dressing Hydrofera Blue Ready Transfer Foam, 2.5x2.5 (in/in) Discharge Instruction: Apply Hydrofera Blue Ready to wound bed as directed Secondary Dressing ABD Pad 5x9 (in/in) Discharge Instruction: Cover with ABD pad Secured With Compression Wrap Urgo K2 Lite, two layer compression system, regular Compression Stockings Add-Ons Electronic Signature(s) Signed: 01/01/2023 4:41:30 PM By: Midge Aver MSN RN CNS [address] [place] ([id number]) PM By: Midge Aver MSN RN CNS Margarita Rana [id number].pdf Page 8 of 9 Signed: 01/01/2023 4:41:30 Entered By: Midge Aver on 01/01/2023 08:44:23 -------------------------------------------------------------------------------- Wound Assessment Details Patient Name: Date of Service: [name], [name] 01/01/2023 8:30 A M Medical Record Number: [id number] Patient Account Number: [id number] Date of Birth/Sex: Treating RN: 1931/10/21 (89 y.o. [name] Primary Care Haruki Arnold: Kerby Nora Other Clinician: Referring Oseas Detty: Treating Julianne Chamberlin/Extender: Madie Reno, [name] in Treatment: 12 Wound Status Wound Number: 3 Primary Etiology: Venous Leg Ulcer Wound  Location: Left, Lateral Lower Leg Wound Status: Open Wounding Event: Shear/Friction Comorbid History: Cataracts, Arrhythmia, Osteoarthritis, Neuropathy Date Acquired: 12/20/2022 Weeks Of Treatment: 1 Clustered Wound: No Photos Wound Measurements Length: (cm) 0.8 Width: (cm) 0.7 Depth: (cm) 0.1 Area: (cm) 0.44 Volume: (cm) 0.044 % Reduction in Area: 22.1% % Reduction in Volume: 22.8% Wound Description Classification: Partial Thickness Exudate Amount: Small Exudate Type: Serosanguineous Exudate Color:  red, brown Foul Odor After Cleansing: No Slough/Fibrino Yes Wound Bed Granulation Amount: Small (1-33%) Exposed Structure Granulation Quality: Pink Fascia Exposed: No Necrotic Amount: Small (1-33%) Fat Layer (Subcutaneous Tissue) Exposed: Yes Necrotic Quality: Adherent Slough Tendon Exposed: No Muscle Exposed: No Joint Exposed: No Bone Exposed: No Treatment Notes Wound #3 (Lower Leg) Wound Laterality: Left, Lateral Cleanser Soap and Water Discharge Instruction: Gently cleanse wound with antibacterial soap, rinse and pat dry prior to dressing wounds Peri-Wound Care Topical Primary Dressing Xeroform-HBD 2x2 (in/in) Discharge Instruction: Apply Xeroform-HBD 2x2 (in/in) as directed SOLOMIYA, PASCALE ([id number]) [id number] Page 9 of 9 Secondary Dressing ABD Pad 5x9 (in/in) Discharge Instruction: Cover with ABD pad Secured With Conform 4'' - Conforming Stretch Gauze Bandage 4x75 (in/in) Discharge Instruction: Apply as directed Compression Wrap Compression Stockings Add-Ons Electronic Signature(s) Signed: 01/01/2023 4:41:30 PM By: Midge Aver MSN RN CNS WTA Entered By: Midge Aver on 01/01/2023 08:44:44 -------------------------------------------------------------------------------- Vitals Details Patient Name: Date of Service: [name], [name] 01/01/2023 8:30 A M Medical Record Number: [id number] Patient Account Number: [id number] Date of  Birth/Sex: Treating RN: 1932-03-11 (89 y.o. [name] Primary Care Xzavien Harada: Kerby Nora Other Clinician: Referring Rahma Meller: Treating Mansa Willers/Extender: Madie Reno, [name] in Treatment: 12 Vital Signs Time Taken: 08:31 Temperature (F): 98.1 Height (in): 68 Pulse (bpm): 106 Weight (lbs): 137 Respiratory Rate (breaths/min): 16 Body Mass Index (BMI): 20.8 Blood Pressure (mmHg): 123/86 Reference Range: 80 - 120 mg / dl Electronic Signature(s) Signed: 01/01/2023 4:41:30 PM By: Midge Aver MSN RN CNS WTA Entered By: Midge Aver on 01/01/2023 09:18:31

## 2023-01-02 NOTE — Progress Notes (Addendum)
Received INR result of 1.8 from [facility] via email. [name] has [facility] draw labs on Mondays and Thursdays. PTT was 17.9.  Increase dose today to take 1 tablet and then continue 1 tablet daily except take 1/2 tablet on Sundays and Thursdays. Recheck in 3 weeks, on 01/23/23.  Contacted pt and advised of dosing and when to recheck. Instructions also emailed to [place] and [name] at [facility].

## 2023-01-02 NOTE — Progress Notes (Signed)
BRITTLYN, [name] [place] ([id number]) [id number] Page 1 of 7 Visit Report for 01/01/2023 Chief Complaint Document Details Patient Name: Date of Service: [name], [name] 01/01/2023 8:30 A M Medical Record Number: [id number] Patient Account Number: [id number] Date of Birth/Sex: Treating RN: 09-20-1931 (89 y.o. [name] Primary Care Provider: Kerby Nora Other Clinician: Referring Provider: Treating Provider/Extender: Madie Reno, [name] in Treatment: 12 Information Obtained from: Patient Chief Complaint 10/09/2022; right lower extremity wound, 12/25/2022; left lower extremity wound Electronic Signature(s) Signed: 01/01/2023 11:12:29 AM By: Geralyn Corwin DO Entered By: Geralyn Corwin on 01/01/2023 09:45:54 -------------------------------------------------------------------------------- Debridement Details Patient Name: Date of Service: [name], [name] 01/01/2023 8:30 A M Medical Record Number: [id number] Patient Account Number: [id number] Date of Birth/Sex: Treating RN: Mar 15, 1932 (89 y.o. [name] Primary Care Provider: Kerby Nora Other Clinician: Referring Provider: Treating Provider/Extender: Madie Reno, [name] in Treatment: 12 Debridement Performed for Assessment: Wound #2 Right,Anterior Lower Leg Performed By: Physician Geralyn Corwin, MD Debridement Type: Debridement Level of Consciousness (Pre-procedure): Awake and Alert Pre-procedure Verification/Time Out Yes - 08:52 Taken: Start Time: 08:52 Pain Control: Lidocaine 4% T opical Solution T Area Debrided (L x W): otal 1.2 (cm) x 0.4 (cm) = 0.48 (cm) Tissue and other material debrided: Viable, Non-Viable, Slough, Slough Level: Non-Viable Tissue Debridement Description: Selective/Open Wound Instrument: Curette Bleeding: Minimum Hemostasis Achieved: Pressure Response to Treatment: Procedure was tolerated well Level of Consciousness (Post- Awake and  Alert procedure): Post Debridement Measurements of Total Wound Length: (cm) 1.2 Width: (cm) 0.4 Depth: (cm) 0.2 Volume: (cm) 0.075 Character of Wound/Ulcer Post Debridement: Stable Post Procedure Diagnosis Same as Pre-procedure Electronic Signature(s) Signed: 01/01/2023 11:12:29 AM By: Geralyn Corwin DO Signed: 01/01/2023 4:41:30 PM By: Midge Aver MSN RN CNS WTA Entered By: Midge Aver on 01/01/2023 08:54:18 [name], [name] ([id number]) [id number] Page 2 of 7 -------------------------------------------------------------------------------- HPI Details Patient Name: Date of Service: [name], [name] 01/01/2023 8:30 A M Medical Record Number: [id number] Patient Account Number: [id number] Date of Birth/Sex: Treating RN: Jan 31, 1932 (89 y.o. [name] Primary Care Provider: Kerby Nora Other Clinician: Referring Provider: Treating Provider/Extender: Madie Reno, [name] in Treatment: 12 History of Present Illness HPI Description: 05/08/2021 upon evaluation today patient appears for initial inspection here in the clinic concerning issues that she has been having actually with a wound over the anterior portion of her right leg. She tells me that this was due to an injury where she struck this on something causing the initial trauma and she has been having trouble getting it healed. [name] over at [facility] has been helping take care of this. With that being said because it was not healing quite as quickly and effectively is what she wanted to see she did want [place] to take a look and make any recommendations to try to help things along. I am definitely happy to do that for sure. Fortunately there does not appear to be any signs of active infection at this time which is great news. The patient also does not seem to have too deep of the wound which is also good news. Her biggest concern is she really does not want to come weekly to the clinic  especially since she has care at the [facility] location as far as getting the dressing changed and keeping an eye on it. She does have a history of atrial fibrillation for which she is on warfarin and she does not like to  do water aerobics twice a week although she is not able to right now due to the fact that she has this wound and cannot get in the water. That is a significant issue for her at this point. Otherwise does have noncompressible pulses based on what we are seeing today. She wears compression socks for her chronic venous insufficiency she has had previous ablation therapy. 05/29/2021 upon evaluation today patient appears to be doing well with regard to her wound. Overall this is actually showing signs of good improvement with significant overall decrease in size since last week even. Overall I feel like that she is doing awesome and making excellent progress. Over at [facility] they been taking good care of her. 06/14/2021 upon evaluation today patient appears to be doing excellent in regard to her wound. In fact she is completely healed based on what we are seeing currently. I do not see any signs of active infection at this time which is great news. 10/05/2022 [name] is an 89 year old female with a past medical history of chronic venous insufficiency and atrial fibrillation on warfarin that presents the clinic for a 1-50-month history of nonhealing ulcer to the anterior aspect of the right lower extremity. She states she hit her leg against an object in the beginning of December. She describes what started out as a hematoma and was debrided by a physician in her facility. She has been using Xeroform to the wound bed. She currently denies signs of infection. She has been wearing her compression stocking. 1/31; patient presents for follow-up. She has been using Hydrofera Blue and Santyl to the wound bed daily. There is been improvement in wound healing. She is scheduled for ABIs  with TBI's today. She has no issues or complaints today. She denies signs of infection. 2/7; patient presents for follow-up. She has been using Hydrofera Blue and Santyl to the wound bed. She had her ABIs with TBI's however the results have not been released. She has no issues or complaints today. 2/14; patient presents for follow-up. She has been using Hydrofera Blue and Santyl to the wound bed. Her ABI resulted and showed noncompressible on the right with a TBI of 0.88 with triphasic waveforms. 2/21; patient presents for follow-up. We have been using Hydrofera Blue and Santyl to the wound bed under 3 layer compression. She has no issues or complaints today. There is been improvement in wound healing. 2/28; patient presents for follow-up. We have been using Hydrofera Blue and Santyl to the wound bed under 3 layer compression to the right lower extremity. There continues to be improvement in wound healing. She has no issues or complaints today. 3/6; patient presents for follow-up. We have been using Hydrofera Blue and Santyl to the wound bed under 3 layer compression to the right lower extremity. She has no issues or complaints today. 3/20; patient presents for follow-up. We have been using Hydrofera Blue and Santyl to the wound bed under 3 layer compression. Wound is cleaner and smaller today. She has no issues or complaints today. She gets the wrap changed 2 additional times a week at her living facility. 3/27; patient presents for follow-up. We have been using Hydrofera Blue and Santyl to the wound bed under 3 layer compression. The wound is smaller today. 4/10; patient presents for follow-up. We have been using Hydrofera Blue and Santyl to the right lower extremity wound under 3 layer compression. There is been improvement in healing. Unfortunately she is developed a new wound  to the left leg. She is not sure how it started she thinks and it happened spontaneously. She has been keeping the area  covered. 4/17; patient presents for follow-up. We have been using Hydrofera Blue to the right lower extremity under 3 layer compression. She developed a new wound to the left leg last clinic visit and now she has a new skin tear to the anterior portion of the left leg that she states she caused with her nail. We discussed doing a compression wrap to this leg however she declined. She states the [facility] is sticking to the left lower extremity wound sites. Electronic Signature(s) Signed: 01/01/2023 11:12:29 AM By: Geralyn Corwin DO Entered By: Geralyn Corwin on 01/01/2023 09:44:38 -------------------------------------------------------------------------------- Physical Exam Details Patient Name: Date of Service: [name], [name] 01/01/2023 8:30 A M Medical Record Number: [id number] Patient Account Number: [id number] Date of Birth/Sex: Treating RN: [birth year] (89 y.o. [name] Primary Care Provider: Kerby Nora Other Clinician: Referring Provider: Treating Provider/Extender: Madie Reno, [name] in Treatment: [address], [name] ([id number]) [id number] Page 3 of 7 Constitutional . Cardiovascular . Psychiatric . Notes Right lower extremity: T the anterior aspect there is an open wound with granulation tissue, devitalized tissue and slough. Good edema control. No surrounding o signs of infection including increased warmth, erythema or purulent drainage. Venous stasis dermatitis throughout the leg. T the left lower extremity there is a o small circular wound to the lateral distal aspect with granulation tissue. T the anterior aspect there is a small skin tear. o Significant hemosiderin staining throughout the leg. 2+ pitting edema to the knee. No signs of surrounding infection. Electronic Signature(s) Signed: 01/01/2023 11:12:29 AM By: Geralyn Corwin DO Entered By: Geralyn Corwin on 01/01/2023  09:18:43 -------------------------------------------------------------------------------- Physician Orders Details Patient Name: Date of Service: [name], [name] 01/01/2023 8:30 A M Medical Record Number: [id number] Patient Account Number: [id number] Date of Birth/Sex: Treating RN: 05-13-1932 (89 y.o. [name] Primary Care Provider: Kerby Nora Other Clinician: Referring Provider: Treating Provider/Extender: Madie Reno, [name] in Treatment: 62 Verbal / Phone Orders: No Diagnosis Coding Follow-up Appointments Return Appointment in 1 week. Nurse Visit as needed [employer] wounds with antibacterial soap and water. May shower with wound dressing protected with water repellent cover or cast protector. No tub bath. Anesthetic (Use 'Patient Medications' Section for Anesthetic Order Entry) Lidocaine applied to wound bed Edema Control - Lymphedema / Segmental Compressive Device / Other Right Lower Extremity 3 Layer Compression System for Lymphedema. Elevate, Exercise Daily and A void Standing for Long Periods of Time. Elevate legs to the level of the heart and pump ankles as often as possible Wound Treatment Wound #2 - Lower Leg Wound Laterality: Right, Anterior Cleanser: Soap and Water 3 x Per Week/30 Days Discharge Instructions: Gently cleanse wound with antibacterial soap, rinse and pat dry prior to dressing wounds Prim Dressing: Hydrofera Blue Ready Transfer Foam, 2.5x2.5 (in/in) 3 x Per Week/30 Days ary Discharge Instructions: Apply Hydrofera Blue Ready to wound bed as directed Secondary Dressing: ABD Pad 5x9 (in/in) 3 x Per Week/30 Days Discharge Instructions: Cover with ABD pad Compression Wrap: Urgo K2 Lite, two layer compression system, regular 3 x Per Week/30 Days Wound #3 - Lower Leg Wound Laterality: Left, Lateral Cleanser: Soap and Water 3 x Per Week/30 Days Discharge Instructions: Gently cleanse wound with antibacterial soap,  rinse and pat dry prior to dressing wounds Prim Dressing: Xeroform-HBD 2x2 (in/in) 3  x Per Week/30 Days ary Discharge Instructions: Apply Xeroform-HBD 2x2 (in/in) as directed Secondary Dressing: ABD Pad 5x9 (in/in) 3 x Per Week/30 Days Discharge Instructions: Cover with ABD pad Secured With: Conform 4'' - Conforming Stretch Gauze Bandage 4x75 (in/in) 3 x Per Week/30 Days KEVIANA, GUIDA ([id number]) [id number] Page 4 of 7 Discharge Instructions: Apply as directed Electronic Signature(s) Signed: 01/01/2023 11:12:29 AM By: Geralyn Corwin DO Entered By: Geralyn Corwin on 01/01/2023 09:47:33 -------------------------------------------------------------------------------- Problem List Details Patient Name: Date of Service: [name], [name] 01/01/2023 8:30 A M Medical Record Number: [id number] Patient Account Number: [id number] Date of Birth/Sex: Treating RN: Oct 25, 1931 (89 y.o. [name] Primary Care Provider: Kerby Nora Other Clinician: Referring Provider: Treating Provider/Extender: Madie Reno, [name] in Treatment: 12 Active Problems ICD-10 Encounter Code Description Active Date MDM Diagnosis L97.812 Non-pressure chronic ulcer of other part of right lower leg with fat layer 10/09/2022 No Yes exposed I87.311 Chronic venous hypertension (idiopathic) with ulcer of right lower extremity 10/09/2022 No Yes S81.802A Unspecified open wound, left lower leg, initial encounter 12/25/2022 No Yes I87.312 Chronic venous hypertension (idiopathic) with ulcer of left lower extremity 01/01/2023 No Yes I48.20 Chronic atrial fibrillation, unspecified 10/09/2022 No Yes Z79.01 Long term (current) use of anticoagulants 10/09/2022 No Yes Inactive Problems Resolved Problems Electronic Signature(s) Signed: 01/01/2023 11:12:29 AM By: Geralyn Corwin DO Entered By: Geralyn Corwin on 01/01/2023  09:45:48 -------------------------------------------------------------------------------- Progress Note Details Patient Name: Date of Service: [name], [name] 01/01/2023 8:30 A M Medical Record Number: [id number] Patient Account Number: [id number] Date of Birth/Sex: Treating RN: August 24, 1932 (89 y.o. [name] Primary Care Provider: Kerby Nora Other Clinician: Referring Provider: Treating Provider/Extender: Madie Reno, [name] in Treatment: [address], [name] ([id number]) [id number] Page 5 of 7 Chief Complaint Information obtained from Patient 10/09/2022; right lower extremity wound, 12/25/2022; left lower extremity wound History of Present Illness (HPI) 05/08/2021 upon evaluation today patient appears for initial inspection here in the clinic concerning issues that she has been having actually with a wound over the anterior portion of her right leg. She tells me that this was due to an injury where she struck this on something causing the initial trauma and she has been having trouble getting it healed. [name] over at [facility] has been helping take care of this. With that being said because it was not healing quite as quickly and effectively is what she wanted to see she did want [place] to take a look and make any recommendations to try to help things along. I am definitely happy to do that for sure. Fortunately there does not appear to be any signs of active infection at this time which is great news. The patient also does not seem to have too deep of the wound which is also good news. Her biggest concern is she really does not want to come weekly to the clinic especially since she has care at the [facility] location as far as getting the dressing changed and keeping an eye on it. She does have a history of atrial fibrillation for which she is on warfarin and she does not like to do water aerobics twice a week although she is not able  to right now due to the fact that she has this wound and cannot get in the water. That is a significant issue for her at this point. Otherwise does have noncompressible pulses based on what we are seeing today. She wears compression  socks for her chronic venous insufficiency she has had previous ablation therapy. 05/29/2021 upon evaluation today patient appears to be doing well with regard to her wound. Overall this is actually showing signs of good improvement with significant overall decrease in size since last week even. Overall I feel like that she is doing awesome and making excellent progress. Over at [facility] they been taking good care of her. 06/14/2021 upon evaluation today patient appears to be doing excellent in regard to her wound. In fact she is completely healed based on what we are seeing currently. I do not see any signs of active infection at this time which is great news. 10/05/2022 [name] is an 89 year old female with a past medical history of chronic venous insufficiency and atrial fibrillation on warfarin that presents the clinic for a 1-61-month history of nonhealing ulcer to the anterior aspect of the right lower extremity. She states she hit her leg against an object in the beginning of December. She describes what started out as a hematoma and was debrided by a physician in her facility. She has been using Xeroform to the wound bed. She currently denies signs of infection. She has been wearing her compression stocking. 1/31; patient presents for follow-up. She has been using Hydrofera Blue and Santyl to the wound bed daily. There is been improvement in wound healing. She is scheduled for ABIs with TBI's today. She has no issues or complaints today. She denies signs of infection. 2/7; patient presents for follow-up. She has been using Hydrofera Blue and Santyl to the wound bed. She had her ABIs with TBI's however the results have not been released. She has no issues  or complaints today. 2/14; patient presents for follow-up. She has been using Hydrofera Blue and Santyl to the wound bed. Her ABI resulted and showed noncompressible on the right with a TBI of 0.88 with triphasic waveforms. 2/21; patient presents for follow-up. We have been using Hydrofera Blue and Santyl to the wound bed under 3 layer compression. She has no issues or complaints today. There is been improvement in wound healing. 2/28; patient presents for follow-up. We have been using Hydrofera Blue and Santyl to the wound bed under 3 layer compression to the right lower extremity. There continues to be improvement in wound healing. She has no issues or complaints today. 3/6; patient presents for follow-up. We have been using Hydrofera Blue and Santyl to the wound bed under 3 layer compression to the right lower extremity. She has no issues or complaints today. 3/20; patient presents for follow-up. We have been using Hydrofera Blue and Santyl to the wound bed under 3 layer compression. Wound is cleaner and smaller today. She has no issues or complaints today. She gets the wrap changed 2 additional times a week at her living facility. 3/27; patient presents for follow-up. We have been using Hydrofera Blue and Santyl to the wound bed under 3 layer compression. The wound is smaller today. 4/10; patient presents for follow-up. We have been using Hydrofera Blue and Santyl to the right lower extremity wound under 3 layer compression. There is been improvement in healing. Unfortunately she is developed a new wound to the left leg. She is not sure how it started she thinks and it happened spontaneously. She has been keeping the area covered. 4/17; patient presents for follow-up. We have been using Hydrofera Blue to the right lower extremity under 3 layer compression. She developed a new wound to the left leg last  clinic visit and now she has a new skin tear to the anterior portion of the left leg that she  states she caused with her nail. We discussed doing a compression wrap to this leg however she declined. She states the [facility] is sticking to the left lower extremity wound sites. Objective Constitutional Vitals Time Taken: 8:31 AM, Height: 68 in, Weight: 137 lbs, BMI: 20.8, Temperature: 98.1 F, Pulse: 106 bpm, Respiratory Rate: 16 breaths/min, Blood Pressure: 123/86 mmHg. General Notes: Right lower extremity: T the anterior aspect there is an open wound with granulation tissue, devitalized tissue and slough. Good edema control. o No surrounding signs of infection including increased warmth, erythema or purulent drainage. Venous stasis dermatitis throughout the leg. T the left lower o extremity there is a small circular wound to the lateral distal aspect with granulation tissue. T the anterior aspect there is a small skin tear. Significant o hemosiderin staining throughout the leg. 2+ pitting edema to the knee. No signs of surrounding infection. Integumentary (Hair, Skin) Wound #2 status is Open. Original cause of wound was Skin T ear/Laceration. The date acquired was: 08/30/2022. The wound has been in treatment 12 weeks. The wound is located on the Right,Anterior Lower Leg. The wound measures 1.2cm length x 0.4cm width x 0.1cm depth; 0.377cm^2 area and 0.038cm^3 volume. There is Fat Layer (Subcutaneous Tissue) exposed. There is a medium amount of serosanguineous drainage noted. There is small (1-33%) red, pink granulation within the wound bed. There is a large (67-100%) amount of necrotic tissue within the wound bed including Adherent Slough. Wound #3 status is Open. Original cause of wound was Shear/Friction. The date acquired was: 12/20/2022. The wound has been in treatment 1 weeks. The wound Mackenzie, [name] ([id number]) [id number] Page 6 of 7 is located on the Left,Lateral Lower Leg. The wound measures 0.8cm length x 0.7cm width x 0.1cm depth; 0.44cm^2 area  and 0.044cm^3 volume. There is Fat Layer (Subcutaneous Tissue) exposed. There is a small amount of serosanguineous drainage noted. There is small (1-33%) pink granulation within the wound bed. There is a small (1-33%) amount of necrotic tissue within the wound bed including Adherent Slough. Assessment Active Problems ICD-10 Non-pressure chronic ulcer of other part of right lower leg with fat layer exposed Chronic venous hypertension (idiopathic) with ulcer of right lower extremity Unspecified open wound, left lower leg, initial encounter Chronic venous hypertension (idiopathic) with ulcer of left lower extremity Chronic atrial fibrillation, unspecified Long term (current) use of anticoagulants Patient's right lower extremity wound has shown improvement in size and appearance since last clinic visit. I debrided nonviable tissue. I recommended continuing the course with Hydrofera Blue under compression wrap. T the left lower extremity the wound is stable. Now she has a new skin tear caused by o her nail. I recommended she have this leg wrapped as well to help facilitate wound healing. She declined this option. She is aware that the wound may not heal without proper edema control. At this time I recommended Xeroform since the [facility] is sticking. Continue compression stockings. Procedures Wound #2 Pre-procedure diagnosis of Wound #2 is a Skin T located on the Right,Anterior Lower Leg . There was a Selective/Open Wound Non-Viable Tissue ear Debridement with a total area of 0.48 sq cm performed by Geralyn Corwin, MD. With the following instrument(s): Curette to remove Viable and Non-Viable tissue/material. Material removed includes [facility] after achieving pain control using Lidocaine 4% Topical Solution. No specimens were taken. A time out was conducted at  08:52, prior to the start of the procedure. A Minimum amount of bleeding was controlled with Pressure. The procedure was tolerated well.  Post Debridement Measurements: 1.2cm length x 0.4cm width x 0.2cm depth; 0.075cm^3 volume. Character of Wound/Ulcer Post Debridement is stable. Post procedure Diagnosis Wound #2: Same as Pre-Procedure Plan Follow-up Appointments: Return Appointment in 1 week. Nurse Visit as needed Bathing/ Shower/ Hygiene: Wash wounds with antibacterial soap and water. May shower with wound dressing protected with water repellent cover or cast protector. No tub bath. Anesthetic (Use 'Patient Medications' Section for Anesthetic Order Entry): Lidocaine applied to wound bed Edema Control - Lymphedema / Segmental Compressive Device / Other: 3 Layer Compression System for Lymphedema. Elevate, Exercise Daily and Avoid Standing for Long Periods of Time. Elevate legs to the level of the heart and pump ankles as often as possible WOUND #2: - Lower Leg Wound Laterality: Right, Anterior Cleanser: Soap and Water 3 x Per Week/30 Days Discharge Instructions: Gently cleanse wound with antibacterial soap, rinse and pat dry prior to dressing wounds Prim Dressing: Hydrofera Blue Ready Transfer Foam, 2.5x2.5 (in/in) 3 x Per Week/30 Days ary Discharge Instructions: Apply Hydrofera Blue Ready to wound bed as directed Secondary Dressing: ABD Pad 5x9 (in/in) 3 x Per Week/30 Days Discharge Instructions: Cover with ABD pad Com pression Wrap: Urgo K2 Lite, two layer compression system, regular 3 x Per Week/30 Days WOUND #3: - Lower Leg Wound Laterality: Left, Lateral Cleanser: Soap and Water 3 x Per Week/30 Days Discharge Instructions: Gently cleanse wound with antibacterial soap, rinse and pat dry prior to dressing wounds Prim Dressing: Xeroform-HBD 2x2 (in/in) 3 x Per Week/30 Days ary Discharge Instructions: Apply Xeroform-HBD 2x2 (in/in) as directed Secondary Dressing: ABD Pad 5x9 (in/in) 3 x Per Week/30 Days Discharge Instructions: Cover with ABD pad Secured With: Conform 4'' - Conforming Stretch Gauze Bandage 4x75  (in/in) 3 x Per Week/30 Days Discharge Instructions: Apply as directed 1. Hydrofera Blue under 3 layer compressionooright lower extremity 2. Xeroform to the left lower extremity 3. Continue compression stocking to the left lower extremity 4. Follow-up in 1 week Mackenzie, [name] ([id number]) [id number] Page 7 of 7 Electronic Signature(s) Signed: 01/01/2023 11:12:29 AM By: Geralyn Corwin DO Entered By: Geralyn Corwin on 01/01/2023 09:47:11 -------------------------------------------------------------------------------- SuperBill Details Patient Name: Date of Service: [name], [name] 01/01/2023 Medical Record Number: [id number] Patient Account Number: [id number] Date of Birth/Sex: Treating RN: 05/03/1932 (89 y.o. [name] Primary Care Provider: Kerby Nora Other Clinician: Referring Provider: Treating Provider/Extender: Madie Reno, [name] in Treatment: 12 Diagnosis Coding ICD-10 Codes Code Description [phone] Non-pressure chronic ulcer of other part of right lower leg with fat layer exposed I87.311 Chronic venous hypertension (idiopathic) with ulcer of right lower extremity S81.802A Unspecified open wound, left lower leg, initial encounter I87.312 Chronic venous hypertension (idiopathic) with ulcer of left lower extremity I48.20 Chronic atrial fibrillation, unspecified Z79.01 Long term (current) use of anticoagulants Facility Procedures : CPT4 Code: [id number] Description: 97597 - DEBRIDE WOUND 1ST 20 SQ CM OR < ICD-10 Diagnosis Description L97.812 Non-pressure chronic ulcer of other part of right lower leg with fat layer expos Modifier: ed Quantity: 1 Physician Procedures : CPT4 Code Description Modifier [id number] 99213 - WC PHYS LEVEL 3 - EST PT 25 ICD-10 Diagnosis Description S81.802A Unspecified open wound, left lower leg, initial encounter I87.312 Chronic venous hypertension (idiopathic) with ulcer of left lower   extremity Quantity: 1 : [id number] 97597 - WC PHYS DEBR WO ANESTH 20 SQ CM ICD-10  Diagnosis Description L97.812 Non-pressure chronic ulcer of other part of right lower leg with fat layer exposed Quantity: 1 Electronic Signature(s) Signed: 01/01/2023 11:12:29 AM By: Geralyn Corwin DO Entered By: Geralyn Corwin on 01/01/2023 09:47:26

## 2023-01-03 ENCOUNTER — Ambulatory Visit: Payer: PPO

## 2023-01-08 ENCOUNTER — Encounter (HOSPITAL_BASED_OUTPATIENT_CLINIC_OR_DEPARTMENT_OTHER): Payer: PPO | Admitting: Internal Medicine

## 2023-01-08 DIAGNOSIS — I87311 Chronic venous hypertension (idiopathic) with ulcer of right lower extremity: Secondary | ICD-10-CM | POA: Diagnosis not present

## 2023-01-08 DIAGNOSIS — L97812 Non-pressure chronic ulcer of other part of right lower leg with fat layer exposed: Secondary | ICD-10-CM | POA: Diagnosis not present

## 2023-01-08 DIAGNOSIS — S81802A Unspecified open wound, left lower leg, initial encounter: Secondary | ICD-10-CM

## 2023-01-08 DIAGNOSIS — I87312 Chronic venous hypertension (idiopathic) with ulcer of left lower extremity: Secondary | ICD-10-CM | POA: Diagnosis not present

## 2023-01-09 NOTE — Progress Notes (Signed)
CHALONDA, SCHLATTER [place] ([id number]) [id number] Page 1 of 9 Visit Report for 01/08/2023 Arrival Information Details Patient Name: Date of Service: [name], [name] 01/08/2023 11:00 A M Medical Record Number: [id number] Patient Account Number: [id number] Date of Birth/Sex: Treating RN: Jun 12, 1932 (89 y.o. Skip Mayer Primary Care Shamar Engelmann: Kerby Nora Other Clinician: Betha Loa Referring Xoe Hoe: Treating Niesha Bame/Extender: Madie Reno, [name] in Treatment: 13 Visit Information History Since Last Visit All ordered tests and consults were completed: No Patient Arrived: [name] Added or deleted any medications: No Arrival Time: 11:23 Any new allergies or adverse reactions: No Transfer Assistance: None Had a fall or experienced change in No Patient Identification Verified: Yes activities of daily living that may affect Secondary Verification Process Completed: Yes risk of falls: Patient Requires Transmission-Based Precautions: No Signs or symptoms of abuse/neglect since last visito No Patient Has Alerts: Yes Hospitalized since last visit: No Patient Alerts: Patient on Blood Thinner Implantable device outside of the clinic excluding No Warfarin cellular tissue based products placed in the center Left ABI: 1.14 since last visit: Has Dressing in Place as Prescribed: Yes Has Compression in Place as Prescribed: Yes Pain Present Now: No Electronic Signature(s) Signed: 01/08/2023 12:31:39 PM By: Geralyn Corwin DO Entered By: Geralyn Corwin on 01/08/2023 11:47:13 -------------------------------------------------------------------------------- Clinic Level of Care Assessment Details Patient Name: Date of Service: [name], [name] 01/08/2023 11:00 A M Medical Record Number: [id number] Patient Account Number: [id number] Date of Birth/Sex: Treating RN: [birth year] (89 y.o. Skip Mayer Primary Care Chattie Greeson: Kerby Nora Other Clinician:  Betha Loa Referring Arsenia Goracke: Treating Erikson Danzy/Extender: Madie Reno, [name] in Treatment: 13 Clinic Level of Care Assessment Items TOOL 1 Quantity Score []  - 0 Use when EandM and Procedure is performed on INITIAL visit ASSESSMENTS - Nursing Assessment / Reassessment []  - 0 General Physical Exam (combine w/ comprehensive assessment (listed just below) when performed on new pt. evals) []  - 0 Comprehensive Assessment (HX, ROS, Risk Assessments, Wounds Hx, etc.) ASSESSMENTS - Wound and Skin Assessment / Reassessment []  - 0 Dermatologic / Skin Assessment (not related to wound area) ASSESSMENTS - Ostomy and/or Continence Assessment and Care []  - 0 Incontinence Assessment and Management []  - 0 Ostomy Care Assessment and Management (repouching, etc.) PROCESS - Coordination of Care []  - 0 Simple Patient / Family Education for ongoing care []  - 0 Complex (extensive) Patient / Family Education for ongoing care []  - 0 Staff obtains Chiropractor, Records, T Results / Process Orders est []  - 0 Staff telephones HHA, Nursing Homes / Clarify orders / etc []  - 0 Routine Transfer to another Facility (non-emergent condition) []  - 0 Routine Hospital Admission (non-emergent condition) [place], [name] ([id number]) [id number] Page 2 of 9 []  - 0 New Admissions / Manufacturing engineer / Ordering NPWT Apligraf, etc. , []  - 0 Emergency Hospital Admission (emergent condition) PROCESS - Special Needs []  - 0 Pediatric / Minor Patient Management []  - 0 Isolation Patient Management []  - 0 Hearing / Language / Visual special needs []  - 0 Assessment of Community assistance (transportation, D/C planning, etc.) []  - 0 Additional assistance / Altered mentation []  - 0 Support Surface(s) Assessment (bed, cushion, seat, etc.) INTERVENTIONS - Miscellaneous []  - 0 External ear exam []  - 0 Patient Transfer (multiple staff / Nurse, adult / Similar  devices) []  - 0 Simple Staple / Suture removal (25 or less) []  - 0 Complex Staple / Suture removal (26 or more) []  - 0 Hypo/Hyperglycemic Management (do  not check if billed separately)  - 0 Ankle / Brachial Index (ABI) - do not check if billed separately Has the patient been seen at the hospital within the last three years: Yes Total Score: 0 Level Of Care: ____ Electronic Signature(s) Signed: 01/08/2023 4:32:09 PM By: Betha Loa Entered By: Betha Loa on 01/08/2023 11:50:27 -------------------------------------------------------------------------------- Compression Therapy Details Patient Name: Date of Service: [name], [name] 01/08/2023 11:00 A M Medical Record Number: [id number] Patient Account Number: [id number] Date of Birth/Sex: Treating RN: [birth year] (89 y.o. Skip Mayer Primary Care Brinklee Cisse: Kerby Nora Other Clinician: Betha Loa Referring Collette Pescador: Treating Panagiota Perfetti/Extender: Madie Reno, [name] in Treatment: 13 Compression Therapy Performed for Wound Assessment: Wound #2 Right,Anterior Lower Leg Performed By: Farrel Gordon, Angie, Compression Type: Double Layer Pre Treatment ABI: 0.8 Post Procedure Diagnosis Same as Pre-procedure Electronic Signature(s) Signed: 01/08/2023 4:32:09 PM By: Betha Loa Entered By: Betha Loa on 01/08/2023 11:49:36 -------------------------------------------------------------------------------- Encounter Discharge Information Details Patient Name: Date of Service: [name], MA [place] 01/08/2023 11:00 A M Medical Record Number: [id number] Patient Account Number: [id number] Date of Birth/Sex: Treating RN: 1932/01/24 (89 y.o. Cathlean Cower, Kim Primary Care Cindel Daugherty: Kerby Nora Other Clinician: Betha Loa Referring Deidrick Rainey: Treating Elisabetta Mishra/Extender: Madie Reno, [name] in Treatment: 20 Encounter Discharge Information Items Discharge Condition: Stable Ambulatory Status:  [facility] Discharge Destination: Home GAVIN, FAIVRE Lockett ([id number]) [id number] Page 3 of 9 Transportation: Other Accompanied By: self Schedule Follow-up Appointment: Yes Clinical Summary of Care: Electronic Signature(s) Signed: 01/08/2023 4:32:09 PM By: Betha Loa Entered By: Betha Loa on 01/08/2023 12:56:17 -------------------------------------------------------------------------------- Lower Extremity Assessment Details Patient Name: Date of Service: [name], [name] 01/08/2023 11:00 A M Medical Record Number: [id number] Patient Account Number: [id number] Date of Birth/Sex: Treating RN: 05/24/1932 (89 y.o. Cathlean Cower, Kim Primary Care Jacqualynn Parco: Kerby Nora Other Clinician: Betha Loa Referring Klaryssa Fauth: Treating Jhamal Plucinski/Extender: Madie Reno, [name] in Treatment: 13 Edema Assessment Assessed: [Left: No] [Right: Yes] Edema: [Left: N] [Right: o] Calf Left: Right: Point of Measurement: 34 cm From Medial Instep 29.8 cm Ankle Left: Right: Point of Measurement: 12 cm From Medial Instep 19 cm Vascular Assessment Pulses: Dorsalis Pedis Palpable: [Right:Yes] Electronic Signature(s) Signed: 01/08/2023 4:32:09 PM By: Betha Loa Signed: 01/08/2023 4:54:10 PM By: Elliot Gurney, BSN, RN, CWS, Kim RN, BSN Entered By: Betha Loa on 01/08/2023 11:42:22 -------------------------------------------------------------------------------- Multi Wound Chart Details Patient Name: Date of Service: [name], MA Wy[place] 01/08/2023 11:00 A M Medical Record Number: [id number] Patient Account Number: [id number] Date of Birth/Sex: Treating RN: December 20, 1931 (89 y.o. Skip Mayer Primary Care Destine Ambroise: Kerby Nora Other Clinician: Betha Loa Referring Tiphany Fayson: Treating Sherrine Salberg/Extender: Madie Reno, [name] in Treatment: 13 Vital Signs Height(in): 68 Pulse(bpm): 103 Weight(lbs): 137 Blood Pressure(mmHg): 131/83 Body Mass Index(BMI):  20.8 Temperature(F): 97.6 Respiratory Rate(breaths/min): 16 [2:Photos:] [N/A:N/A [id number] Page 4 of 9] Right, Anterior Lower Leg Left, Lateral Lower Leg N/A Wound Location: Skin T ear/Laceration Shear/Friction N/A Wounding Event: Skin T ear Venous Leg Ulcer N/A Primary Etiology: Cataracts, Arrhythmia, Osteoarthritis, Cataracts, Arrhythmia, Osteoarthritis, N/A Comorbid History: Neuropathy Neuropathy 08/30/2022 12/20/2022 N/A Date Acquired: 13 2 N/A Weeks of Treatment: Open Open N/A Wound Status: No No N/A Wound Recurrence: 0.1x0.1x0.1 1x0.8x0.1 N/A Measurements L x W x D (cm) 0.008 0.628 N/A A (cm) : rea 0.001 0.063 N/A Volume (cm) : 100.00% -11.20% N/A % Reduction in Area: 100.00% -10.50% N/A % Reduction in Volume: Full Thickness Without Exposed Partial Thickness N/A Classification: Support  Structures Medium Small N/A Exudate Amount: Serosanguineous Serosanguineous N/A Exudate Type: red, brown red, brown N/A Exudate Color: Small (1-33%) Small (1-33%) N/A Granulation Amount: Red, Pink Pink N/A Granulation Quality: Large (67-100%) Small (1-33%) N/A Necrotic Amount: Fat Layer (Subcutaneous Tissue): Yes Fat Layer (Subcutaneous Tissue): Yes N/A Exposed Structures: Fascia: No Fascia: No Tendon: No Tendon: No Muscle: No Muscle: No Joint: No Joint: No Bone: No Bone: No Small (1-33%) N/A N/A Epithelialization: Compression Therapy N/A N/A Procedures Performed: Treatment Notes Electronic Signature(s) Signed: 01/08/2023 12:31:39 PM By: Geralyn Corwin DO Entered By: Geralyn Corwin on 01/08/2023 12:04:32 -------------------------------------------------------------------------------- Multi-Disciplinary Care Plan Details Patient Name: Date of Service: [name], MA [place] 01/08/2023 11:00 A M Medical Record Number: [id number] Patient Account Number: [id number] Date of Birth/Sex: Treating RN: 1932-07-19 (89 y.o. Cathlean Cower,  Kim Primary Care Avarae Zwart: Kerby Nora Other Clinician: Betha Loa Referring Samanta Gal: Treating Fredis Malkiewicz/Extender: Madie Reno, [name] in Treatment: 72 Active Inactive Necrotic Tissue Nursing Diagnoses: Impaired tissue integrity related to necrotic/devitalized tissue Knowledge deficit related to management of necrotic/devitalized tissue Goals: Necrotic/devitalized tissue will be minimized in the wound bed Date Initiated: 10/09/2022 Date Inactivated: 01/01/2023 Target Resolution Date: 11/09/2022 Goal Status: Met Patient/caregiver will verbalize understanding of reason and process for debridement of necrotic tissue Date Initiated: 10/09/2022 Target Resolution Date: 01/14/2023 Goal Status: Active Interventions: Assess patient pain level pre-, during and post procedure and prior to discharge Provide education on necrotic tissue and debridement process Treatment Activities: Apply topical anesthetic as ordered : 10/09/2022 Excisional debridement : 10/09/2022 DORRI, OZTURK [place] ([id number]) [id number] Page 5 of 9 Notes: Venous Leg Ulcer Nursing Diagnoses: Actual venous Insuffiency (use after diagnosis is confirmed) Knowledge deficit related to disease process and management Goals: Patient will maintain optimal edema control Date Initiated: 10/09/2022 Date Inactivated: 01/01/2023 Target Resolution Date: 11/09/2022 Goal Status: Met Patient/caregiver will verbalize understanding of disease process and disease management Date Initiated: 10/09/2022 Date Inactivated: 01/01/2023 Target Resolution Date: 11/09/2022 Goal Status: Met Verify adequate tissue perfusion prior to therapeutic compression application Date Initiated: 10/09/2022 Target Resolution Date: 02/15/2023 Goal Status: Active Interventions: Assess peripheral edema status every visit. Provide education on venous insufficiency Treatment Activities: T ordered outside of clinic :  10/09/2022 est Notes: Wound/Skin Impairment Nursing Diagnoses: Impaired tissue integrity Knowledge deficit related to ulceration/compromised skin integrity Goals: Patient will have a decrease in wound volume by X% from date: (specify in notes) Date Initiated: 10/09/2022 Date Inactivated: 01/01/2023 Target Resolution Date: 11/09/2022 Goal Status: Met Patient/caregiver will verbalize understanding of skin care regimen Date Initiated: 10/09/2022 Date Inactivated: 01/01/2023 Target Resolution Date: 11/09/2022 Goal Status: Met Ulcer/skin breakdown will have a volume reduction of 30% by week 4 Date Initiated: 10/09/2022 Date Inactivated: 01/01/2023 Target Resolution Date: 11/09/2022 Goal Status: Met Ulcer/skin breakdown will have a volume reduction of 50% by week 8 Date Initiated: 10/09/2022 Target Resolution Date: 01/14/2023 Goal Status: Active Ulcer/skin breakdown will have a volume reduction of 80% by week 12 Date Initiated: 10/09/2022 Target Resolution Date: 02/14/2023 Goal Status: Active Ulcer/skin breakdown will heal within 14 weeks Date Initiated: 10/09/2022 Target Resolution Date: 03/16/2023 Goal Status: Active Interventions: Assess patient/caregiver ability to obtain necessary supplies Assess patient/caregiver ability to perform ulcer/skin care regimen upon admission and as needed Assess ulceration(s) every visit Provide education on ulcer and skin care Treatment Activities: Skin care regimen initiated : 10/09/2022 Notes: Electronic Signature(s) Signed: 01/08/2023 4:32:09 PM By: Betha Loa Signed: 01/08/2023 4:54:10 PM By: Elliot Gurney, BSN, RN, CWS, Kim RN, BSN Entered By: Betha Loa on 01/08/2023 11:51:00  Pain Assessment Details -------------------------------------------------------------------------------- HARUKO, MERSCH ([id number]) [id number] Page 6 of 9 Patient Name: Date of Service: [name], [name] 01/08/2023 11:00 A M Medical Record Number:  [id number] Patient Account Number: [id number] Date of Birth/Sex: Treating RN: 12/10/1931 (89 y.o. Skip Mayer Primary Care Madellyn Denio: Kerby Nora Other Clinician: Betha Loa Referring Dacari Beckstrand: Treating Jeannene Tschetter/Extender: Madie Reno, [name] in Treatment: 9 Active Problems Location of Pain Severity and Description of Pain Patient Has Paino No Site Locations Pain Management and Medication Current Pain Management: Electronic Signature(s) Signed: 01/08/2023 4:32:09 PM By: Betha Loa Signed: 01/08/2023 4:54:10 PM By: Elliot Gurney, BSN, RN, CWS, Kim RN, BSN Entered By: Betha Loa on 01/08/2023 11:28:37 -------------------------------------------------------------------------------- Patient/Caregiver Education Details Patient Name: Date of Service: [name], MA Wyona [place] 4/24/2024andnbsp11:00 A M Medical Record Number: [id number] Patient Account Number: [id number] Date of Birth/Gender: Treating RN: Jan 13, 1932 (89 y.o. Skip Mayer Primary Care Physician: Kerby Nora Other Clinician: Betha Loa Referring Physician: Treating Physician/Extender: Rosalyn Gess Weeks in Treatment: 13 Education Assessment Education Provided To: Patient Education Topics Provided Wound/Skin Impairment: Handouts: Other: continue wound care as directed Methods: Explain/Verbal Responses: State content correctly Electronic Signature(s) Signed: 01/08/2023 4:32:09 PM By: Betha Loa Entered By: Betha Loa on 01/08/2023 12:55:39 Wound Assessment Details -------------------------------------------------------------------------------- Dory Larsen ([id number]) [id number] Page 7 of 9 Patient Name: Date of Service: [name], [name] 01/08/2023 11:00 A M Medical Record Number: [id number] Patient Account Number: [id number] Date of Birth/Sex: Treating RN: 04/30/1932 (89 y.o. Cathlean Cower, Kim Primary Care Alvon Nygaard: Kerby Nora Other Clinician:  Betha Loa Referring Sherah Lund: Treating Jalexus Brett/Extender: Madie Reno, [name] in Treatment: 13 Wound Status Wound Number: 2 Primary Etiology: Skin Tear Wound Location: Right, Anterior Lower Leg Wound Status: Open Wounding Event: Skin Tear/Laceration Comorbid History: Cataracts, Arrhythmia, Osteoarthritis, Neuropathy Date Acquired: 08/30/2022 Weeks Of Treatment: 13 Clustered Wound: No Photos Wound Measurements Length: (cm) 0.1 % Reduction in Area: 100% Width: (cm) 0.1 % Reduction in Volume: 100% Depth: (cm) 0.1 Epithelialization: Small (1-33%) Area: (cm) 0.008 Volume: (cm) 0.001 Wound Description Classification: Full Thickness Without Exposed Support Structures Foul Odor After Cleansing: No Exudate Amount: Medium Slough/Fibrino Yes Exudate Type: Serosanguineous Exudate Color: red, brown Wound Bed Granulation Amount: Small (1-33%) Exposed Structure Granulation Quality: Red, Pink Fascia Exposed: No Necrotic Amount: Large (67-100%) Fat Layer (Subcutaneous Tissue) Exposed: Yes Necrotic Quality: Adherent Slough Tendon Exposed: No Muscle Exposed: No Joint Exposed: No Bone Exposed: No Treatment Notes Wound #2 (Lower Leg) Wound Laterality: Right, Anterior Cleanser Soap and Water Discharge Instruction: Gently cleanse wound with antibacterial soap, rinse and pat dry prior to dressing wounds Peri-Wound Care Topical Primary Dressing Xeroform-HBD 2x2 (in/in) Discharge Instruction: Apply Xeroform-HBD 2x2 (in/in) as directed Secondary Dressing ABD Pad 5x9 (in/in) Discharge Instruction: Cover with ABD pad Secured With Compression SHAKHIA, GRAMAJO JANE ([id number]) [id number] Page 8 of 9 Urgo K2 Lite, two layer compression system, regular Compression Stockings Add-Ons Electronic Signature(s) Signed: 01/08/2023 4:32:09 PM By: Betha Loa Signed: 01/08/2023 4:54:10 PM By: Elliot Gurney, BSN, RN, CWS, Kim RN, BSN Entered By: Betha Loa on 01/08/2023 11:40:22 -------------------------------------------------------------------------------- Wound Assessment Details Patient Name: Date of Service: [name], [name] 01/08/2023 11:00 A M Medical Record Number: [id number] Patient Account Number: [id number] Date of Birth/Sex: Treating RN: [birth year] (89 y.o. Skip Mayer Primary Care Lamount Bankson: Kerby Nora Other Clinician: Betha Loa Referring Katira Dumais: Treating Elisia Stepp/Extender: Madie Reno, [name] in Treatment: 13 Wound Status Wound Number: 3 Primary Etiology:  Venous Leg Ulcer Wound Location: Left, Lateral Lower Leg Wound Status: Open Wounding Event: Shear/Friction Comorbid History: Cataracts, Arrhythmia, Osteoarthritis, Neuropathy Date Acquired: 12/20/2022 Weeks Of Treatment: 2 Clustered Wound: No Photos Wound Measurements Length: (cm) 1 Width: (cm) 0.8 Depth: (cm) 0.1 Area: (cm) 0.628 Volume: (cm) 0.063 % Reduction in Area: -11.2% % Reduction in Volume: -10.5% Wound Description Classification: Partial Thickness Exudate Amount: Small Exudate Type: Serosanguineous Exudate Color: red, brown Foul Odor After Cleansing: No Slough/Fibrino Yes Wound Bed Granulation Amount: Small (1-33%) Exposed Structure Granulation Quality: Pink Fascia Exposed: No Necrotic Amount: Small (1-33%) Fat Layer (Subcutaneous Tissue) Exposed: Yes Necrotic Quality: Adherent Slough Tendon Exposed: No Muscle Exposed: No Joint Exposed: No Bone Exposed: No Treatment Notes Wound #3 (Lower Leg) Wound Laterality: Left, Lateral Cleanser Soap and Water Discharge Instruction: Gently cleanse wound with antibacterial soap, rinse and pat dry prior to dressing wounds [place], [name] ([id number]) [id number] Page 9 of 9 Peri-Wound Care Topical Primary Dressing Xeroform-HBD 2x2 (in/in) Discharge Instruction: Apply Xeroform-HBD 2x2 (in/in) as directed Secondary Dressing ABD Pad 5x9  (in/in) Discharge Instruction: Cover with ABD pad Secured With Conform 4'' - Conforming Stretch Gauze Bandage 4x75 (in/in) Discharge Instruction: Apply as directed Compression Wrap Compression Stockings Add-Ons Electronic Signature(s) Signed: 01/08/2023 4:32:09 PM By: Betha Loa Signed: 01/08/2023 4:54:10 PM By: Elliot Gurney, BSN, RN, CWS, Kim RN, BSN Entered By: Betha Loa on 01/08/2023 11:40:56 -------------------------------------------------------------------------------- Vitals Details Patient Name: Date of Service: [name], [name] 01/08/2023 11:00 A M Medical Record Number: [id number] Patient Account Number: [id number] Date of Birth/Sex: Treating RN: [birth year] (89 y.o. Cathlean Cower, Kim Primary Care Ruddy Swire: Kerby Nora Other Clinician: Betha Loa Referring Rosette Bellavance: Treating Kamauri Kathol/Extender: Madie Reno, [name] in Treatment: 13 Vital Signs Time Taken: 11:28 Temperature (F): 97.6 Height (in): 68 Pulse (bpm): 103 Weight (lbs): 137 Respiratory Rate (breaths/min): 16 Body Mass Index (BMI): 20.8 Blood Pressure (mmHg): 131/83 Reference Range: 80 - 120 mg / dl Electronic Signature(s) Signed: 01/08/2023 4:32:09 PM By: Betha Loa Entered By: Betha Loa on 01/08/2023 11:28:31

## 2023-01-09 NOTE — Progress Notes (Signed)
Mackenzie [name], Mackenzie [name] ([id number]) [id number] Page 1 of 7 Visit Report for 01/08/2023 Chief Complaint Document Details Patient Name: Date of Service: Mackenzie [name], [place] [name] 01/08/2023 11:00 A M Medical Record Number: [id number] Patient Account Number: [id number] Date of Birth/Sex: Treating RN: May 01, 1932 (89 y.o. Mackenzie [name] Primary Care Provider: Kerby [name] Other Clinician: Betha [name] Referring Provider: Treating Provider/Extender: Mackenzie [name], Mackenzie [name] in Treatment: 13 Information Obtained from: Patient Chief Complaint 10/09/2022; right lower extremity wound, 12/25/2022; left lower extremity wound Electronic Signature(s) Signed: 01/08/2023 12:31:39 PM By: Mackenzie Corwin DO Entered By: Mackenzie [name] on 01/08/2023 11:56:34 -------------------------------------------------------------------------------- HPI Details Patient Name: Date of Service: Mackenzie [name], Mackenzie [name] 01/08/2023 11:00 A M Medical Record Number: [id number] Patient Account Number: [id number] Date of Birth/Sex: Treating RN: [birth year] (89 y.o. Mackenzie [name] Primary Care Provider: Kerby [name] Other Clinician: Betha [name] Referring Provider: Treating Provider/Extender: Mackenzie [name], Mackenzie [name] in Treatment: 13 History of Present Illness HPI Description: 05/08/2021 upon evaluation today patient appears for initial inspection here in the clinic concerning issues that she has been having actually with a wound over the anterior portion of her right leg. She tells me that this was due to an injury where she struck this on something causing the initial trauma and she has been having trouble getting it healed. [name] over at [facility] has been helping take care of this. With that being said because it was not healing quite as quickly and effectively is what she wanted to see she did want [place] to take a look and make any recommendations to try to help things along. I am definitely  happy to do that for sure. Fortunately there does not appear to be any signs of active infection at this time which is great news. The patient also does not seem to have too deep of the wound which is also good news. Her biggest concern is she really does not want to come weekly to the clinic especially since she has care at the [facility] location as far as getting the dressing changed and keeping an eye on it. She does have a history of atrial fibrillation for which she is on warfarin and she does not like to do water aerobics twice a week although she is not able to right now due to the fact that she has this wound and cannot get in the water. That is a significant issue for her at this point. Otherwise does have noncompressible pulses based on what we are seeing today. She wears compression socks for her chronic venous insufficiency she has had previous ablation therapy. 05/29/2021 upon evaluation today patient appears to be doing well with regard to her wound. Overall this is actually showing signs of good improvement with significant overall decrease in size since last week even. Overall I feel like that she is doing awesome and making excellent progress. Over at [facility] they been taking good care of her. 06/14/2021 upon evaluation today patient appears to be doing excellent in regard to her wound. In fact she is completely healed based on what we are seeing currently. I do not see any signs of active infection at this time which is great news. 10/05/2022 Mackenzie [name] is an 89 year old female with a past medical history of chronic venous insufficiency and atrial fibrillation on warfarin that presents the clinic for a 1-53-month history of nonhealing ulcer to the anterior aspect of the right lower extremity. She states she hit  her leg against an object in the beginning of December. She describes what started out as a hematoma and was debrided by a physician in her facility. She has been using  Xeroform to the wound bed. She currently denies signs of infection. She has been wearing her compression stocking. 1/31; patient presents for follow-up. She has been using Hydrofera Blue and Santyl to the wound bed daily. There is been improvement in wound healing. She is scheduled for ABIs with TBI's today. She has no issues or complaints today. She denies signs of infection. 2/7; patient presents for follow-up. She has been using Hydrofera Blue and Santyl to the wound bed. She had her ABIs with TBI's however the results have not been released. She has no issues or complaints today. 2/14; patient presents for follow-up. She has been using Hydrofera Blue and Santyl to the wound bed. Her ABI resulted and showed noncompressible on the right with a TBI of 0.88 with triphasic waveforms. 2/21; patient presents for follow-up. We have been using Hydrofera Blue and Santyl to the wound bed under 3 layer compression. She has no issues or complaints today. There is been improvement in wound healing. 2/28; patient presents for follow-up. We have been using Hydrofera Blue and Santyl to the wound bed under 3 layer compression to the right lower extremity. There continues to be improvement in wound healing. She has no issues or complaints today. 3/6; patient presents for follow-up. We have been using Hydrofera Blue and Santyl to the wound bed under 3 layer compression to the right lower extremity. She has no issues or complaints today. [name], Mackenzie [name] Mackenzie [name] ([id number]) [id number] Page 2 of 7 3/20; patient presents for follow-up. We have been using Hydrofera Blue and Santyl to the wound bed under 3 layer compression. Wound is cleaner and smaller today. She has no issues or complaints today. She gets the wrap changed 2 additional times a week at her living facility. 3/27; patient presents for follow-up. We have been using Hydrofera Blue and Santyl to the wound bed under 3 layer compression.  The wound is smaller today. 4/10; patient presents for follow-up. We have been using Hydrofera Blue and Santyl to the right lower extremity wound under 3 layer compression. There is been improvement in healing. Unfortunately she is developed a new wound to the left leg. She is not sure how it started she thinks and it happened spontaneously. She has been keeping the area covered. 4/17; patient presents for follow-up. We have been using Hydrofera Blue to the right lower extremity under 3 layer compression. She developed a new wound to the left leg last clinic visit and now she has a new skin tear to the anterior portion of the left leg that she states she caused with her nail. We discussed doing a compression wrap to this leg however she declined. She states the [facility] is sticking to the left lower extremity wound sites. 4/24; patient presents for follow-up. We have been using Hydrofera Blue to the right lower extremity under 3 layer compression. This wound is just about healed. She still has a wound T the left lateral side. She has been using Xeroform here and her compression stocking daily. o Electronic Signature(s) Signed: 01/08/2023 12:31:39 PM By: Mackenzie Corwin DO Entered By: Mackenzie [name] on 01/08/2023 11:59:19 -------------------------------------------------------------------------------- Physical Exam Details Patient Name: Date of Service: Mackenzie [name], Mackenzie [name] 01/08/2023 11:00 A M Medical Record Number: [id number] Patient Account Number: [id number] Date of Birth/Sex: Treating RN: 1932/08/29 (89  y.o. Mackenzie [name] Primary Care Provider: Kerby [name] Other Clinician: Betha [name] Referring Provider: Treating Provider/Extender: Mackenzie [name], Mackenzie [name] in Treatment: 13 Constitutional . Cardiovascular . Psychiatric . Notes Right lower extremity: T the anterior aspect there is a small open wound with granulation tissue. Good edema control. No surrounding signs  of infection o including increased warmth, erythema or purulent drainage. Venous stasis dermatitis throughout the leg. T the left lower extremity there is a small circular o wound to the lateral distal aspect with granulation tissue. T the anterior aspect this area has epithelialized. Significant hemosiderin staining throughout the leg. o 2+ pitting edema to the knee. No signs of surrounding infection. Electronic Signature(s) Signed: 01/08/2023 12:31:39 PM By: Mackenzie Corwin DO Entered By: Mackenzie [name] on 01/08/2023 12:01:34 -------------------------------------------------------------------------------- Physician Orders Details Patient Name: Date of Service: Mackenzie [name], Mackenzie [name] 01/08/2023 11:00 A M Medical Record Number: [id number] Patient Account Number: [id number] Date of Birth/Sex: Treating RN: [birth year] (89 y.o. Mackenzie [name] Primary Care Provider: Kerby [name] Other Clinician: Betha [name] Referring Provider: Treating Provider/Extender: Rosalyn Gess [name] in Treatment: 58 Verbal / Phone Orders: Yes Clinician: Huel Coventry Read Back and Verified: Yes Diagnosis Coding Follow-up Appointments Return Appointment in 1 week. Nurse Visit as needed [employer] wounds with antibacterial soap and water. May shower with wound dressing protected with water repellent cover or cast protector. No tub bath. Anesthetic (Use 'Patient Medications' Section for Anesthetic Order Entry) Emberlee, Sortino Mackenzie [name] ([id number]) [id number] Page 3 of 7 Lidocaine applied to wound bed Edema Control - Lymphedema / Segmental Compressive Device / Other Right Lower Extremity 3 Layer Compression System for Lymphedema. Elevate, Exercise Daily and A void Standing for Long Periods of Time. Elevate legs to the level of the heart and pump ankles as often as possible Wound Treatment Wound #2 - Lower Leg Wound Laterality: Right, Anterior Cleanser: Soap and  Water 3 x Per Week/30 Days Discharge Instructions: Gently cleanse wound with antibacterial soap, rinse and pat dry prior to dressing wounds Prim Dressing: Xeroform-HBD 2x2 (in/in) 3 x Per Week/30 Days ary Discharge Instructions: Apply Xeroform-HBD 2x2 (in/in) as directed Secondary Dressing: ABD Pad 5x9 (in/in) 3 x Per Week/30 Days Discharge Instructions: Cover with ABD pad Compression Wrap: Urgo K2 Lite, two layer compression system, regular 3 x Per Week/30 Days Wound #3 - Lower Leg Wound Laterality: Left, Lateral Cleanser: Soap and Water 3 x Per Week/30 Days Discharge Instructions: Gently cleanse wound with antibacterial soap, rinse and pat dry prior to dressing wounds Prim Dressing: Xeroform-HBD 2x2 (in/in) 3 x Per Week/30 Days ary Discharge Instructions: Apply Xeroform-HBD 2x2 (in/in) as directed Secondary Dressing: ABD Pad 5x9 (in/in) 3 x Per Week/30 Days Discharge Instructions: Cover with ABD pad Secured With: Conform 4'' - Conforming Stretch Gauze Bandage 4x75 (in/in) 3 x Per Week/30 Days Discharge Instructions: Apply as directed Electronic Signature(s) Signed: 01/08/2023 12:31:39 PM By: Mackenzie Corwin DO Entered By: Mackenzie [name] on 01/08/2023 12:04:16 -------------------------------------------------------------------------------- Problem List Details Patient Name: Date of Service: Mackenzie [name], Mackenzie [name] 01/08/2023 11:00 A M Medical Record Number: [id number] Patient Account Number: [id number] Date of Birth/Sex: Treating RN: October 15, 1931 (89 y.o. Mackenzie [name] Primary Care Provider: Kerby [name] Other Clinician: Betha [name] Referring Provider: Treating Provider/Extender: Mackenzie [name], Mackenzie [name] in Treatment: 13 Active Problems ICD-10 Encounter Code Description Active Date MDM Diagnosis [phone] Non-pressure chronic ulcer of other part of right lower leg with fat layer 10/09/2022 No Yes exposed I87.311 Chronic venous  hypertension (idiopathic) with ulcer of right  lower extremity 10/09/2022 No Yes S81.802A Unspecified open wound, left lower leg, initial encounter 12/25/2022 No Yes I87.312 Chronic venous hypertension (idiopathic) with ulcer of left lower extremity 01/01/2023 No Yes I48.20 Chronic atrial fibrillation, unspecified 10/09/2022 No Yes Mackenzie [name], Mackenzie [name] ([id number]) [id number] Page 4 of 7 Z79.01 Long term (current) use of anticoagulants 10/09/2022 No Yes Inactive Problems Resolved Problems Electronic Signature(s) Signed: 01/08/2023 12:31:39 PM By: Mackenzie Corwin DO Entered By: Mackenzie [name] on 01/08/2023 11:55:56 -------------------------------------------------------------------------------- Progress Note Details Patient Name: Date of Service: Mackenzie [name], Mackenzie [name] 01/08/2023 11:00 A M Medical Record Number: [id number] Patient Account Number: [id number] Date of Birth/Sex: Treating RN: [birth year] (89 y.o. Mackenzie [name] Primary Care Provider: Kerby [name] Other Clinician: Betha [name] Referring Provider: Treating Provider/Extender: Mackenzie [name], Mackenzie [name] in Treatment: 13 Subjective Chief Complaint Information obtained from Patient 10/09/2022; right lower extremity wound, 12/25/2022; left lower extremity wound History of Present Illness (HPI) 05/08/2021 upon evaluation today patient appears for initial inspection here in the clinic concerning issues that she has been having actually with a wound over the anterior portion of her right leg. She tells me that this was due to an injury where she struck this on something causing the initial trauma and she has been having trouble getting it healed. [name] over at [facility] has been helping take care of this. With that being said because it was not healing quite as quickly and effectively is what she wanted to see she did want [place] to take a look and make any recommendations to try to help things along. I am definitely happy to do that for sure. Fortunately there  does not appear to be any signs of active infection at this time which is great news. The patient also does not seem to have too deep of the wound which is also good news. Her biggest concern is she really does not want to come weekly to the clinic especially since she has care at the [facility] location as far as getting the dressing changed and keeping an eye on it. She does have a history of atrial fibrillation for which she is on warfarin and she does not like to do water aerobics twice a week although she is not able to right now due to the fact that she has this wound and cannot get in the water. That is a significant issue for her at this point. Otherwise does have noncompressible pulses based on what we are seeing today. She wears compression socks for her chronic venous insufficiency she has had previous ablation therapy. 05/29/2021 upon evaluation today patient appears to be doing well with regard to her wound. Overall this is actually showing signs of good improvement with significant overall decrease in size since last week even. Overall I feel like that she is doing awesome and making excellent progress. Over at [facility] they been taking good care of her. 06/14/2021 upon evaluation today patient appears to be doing excellent in regard to her wound. In fact she is completely healed based on what we are seeing currently. I do not see any signs of active infection at this time which is great news. 10/05/2022 Ms. Aayla Marrocco is an 89 year old female with a past medical history of chronic venous insufficiency and atrial fibrillation on warfarin that presents the clinic for a 1-109-month history of nonhealing ulcer to the anterior aspect of the right lower extremity. She states she hit her leg  against an object in the beginning of December. She describes what started out as a hematoma and was debrided by a physician in her facility. She has been using Xeroform to the wound bed. She currently  denies signs of infection. She has been wearing her compression stocking. 1/31; patient presents for follow-up. She has been using Hydrofera Blue and Santyl to the wound bed daily. There is been improvement in wound healing. She is scheduled for ABIs with TBI's today. She has no issues or complaints today. She denies signs of infection. 2/7; patient presents for follow-up. She has been using Hydrofera Blue and Santyl to the wound bed. She had her ABIs with TBI's however the results have not been released. She has no issues or complaints today. 2/14; patient presents for follow-up. She has been using Hydrofera Blue and Santyl to the wound bed. Her ABI resulted and showed noncompressible on the right with a TBI of 0.88 with triphasic waveforms. 2/21; patient presents for follow-up. We have been using Hydrofera Blue and Santyl to the wound bed under 3 layer compression. She has no issues or complaints today. There is been improvement in wound healing. 2/28; patient presents for follow-up. We have been using Hydrofera Blue and Santyl to the wound bed under 3 layer compression to the right lower extremity. There continues to be improvement in wound healing. She has no issues or complaints today. 3/6; patient presents for follow-up. We have been using Hydrofera Blue and Santyl to the wound bed under 3 layer compression to the right lower extremity. She has no issues or complaints today. 3/20; patient presents for follow-up. We have been using Hydrofera Blue and Santyl to the wound bed under 3 layer compression. Wound is cleaner and smaller today. She has no issues or complaints today. She gets the wrap changed 2 additional times a week at her living facility. 3/27; patient presents for follow-up. We have been using Hydrofera Blue and Santyl to the wound bed under 3 layer compression. The wound is smaller today. Mackenzie [name], Mackenzie [name] [place] ([id number]) [id number] Page 5 of 7 4/10; patient  presents for follow-up. We have been using Hydrofera Blue and Santyl to the right lower extremity wound under 3 layer compression. There is been improvement in healing. Unfortunately she is developed a new wound to the left leg. She is not sure how it started she thinks and it happened spontaneously. She has been keeping the area covered. 4/17; patient presents for follow-up. We have been using Hydrofera Blue to the right lower extremity under 3 layer compression. She developed a new wound to the left leg last clinic visit and now she has a new skin tear to the anterior portion of the left leg that she states she caused with her nail. We discussed doing a compression wrap to this leg however she declined. She states the [facility] is sticking to the left lower extremity wound sites. 4/24; patient presents for follow-up. We have been using Hydrofera Blue to the right lower extremity under 3 layer compression. This wound is just about healed. She still has a wound T the left lateral side. She has been using Xeroform here and her compression stocking daily. o Objective Constitutional Vitals Time Taken: 11:28 AM, Height: 68 in, Weight: 137 lbs, BMI: 20.8, Temperature: 97.6 F, Pulse: 103 bpm, Respiratory Rate: 16 breaths/min, Blood Pressure: 131/83 mmHg. General Notes: Right lower extremity: T the anterior aspect there is a small open wound with granulation tissue. Good edema control. No surrounding signs  of o infection including increased warmth, erythema or purulent drainage. Venous stasis dermatitis throughout the leg. T the left lower extremity there is a small o circular wound to the lateral distal aspect with granulation tissue. T the anterior aspect this area has epithelialized. Significant hemosiderin staining throughout o the leg. 2+ pitting edema to the knee. No signs of surrounding infection. Integumentary (Hair, Skin) Wound #2 status is Open. Original cause of wound was Skin T  ear/Laceration. The date acquired was: 08/30/2022. The wound has been in treatment 13 [name]. The wound is located on the Right,Anterior Lower Leg. The wound measures 0.1cm length x 0.1cm width x 0.1cm depth; 0.008cm^2 area and 0.001cm^3 volume. There is Fat Layer (Subcutaneous Tissue) exposed. There is a medium amount of serosanguineous drainage noted. There is small (1-33%) red, pink granulation within the wound bed. There is a large (67-100%) amount of necrotic tissue within the wound bed including Adherent Slough. Wound #3 status is Open. Original cause of wound was Shear/Friction. The date acquired was: 12/20/2022. The wound has been in treatment 2 [name]. The wound is located on the Left,Lateral Lower Leg. The wound measures 1cm length x 0.8cm width x 0.1cm depth; 0.628cm^2 area and 0.063cm^3 volume. There is Fat Layer (Subcutaneous Tissue) exposed. There is a small amount of serosanguineous drainage noted. There is small (1-33%) pink granulation within the wound bed. There is a small (1-33%) amount of necrotic tissue within the wound bed including Adherent Slough. Assessment Active Problems ICD-10 Non-pressure chronic ulcer of other part of right lower leg with fat layer exposed Chronic venous hypertension (idiopathic) with ulcer of right lower extremity Unspecified open wound, left lower leg, initial encounter Chronic venous hypertension (idiopathic) with ulcer of left lower extremity Chronic atrial fibrillation, unspecified Long term (current) use of anticoagulants Patient's right lower extremity is almost completely healed. I recommended Xeroform and 1 more week of 3 layer compression. The left lower extremity wound is stable. It has healthy granulation tissue. She continues to decline the compression wrap here. I recommended continuing Xeroform. Follow-up in 1 week. Procedures Wound #2 Pre-procedure diagnosis of Wound #2 is a Skin T located on the Right,Anterior Lower Leg . There was a  Double Layer Compression Therapy Procedure with a ear pre-treatment ABI of 0.8 by Mackenzie [name]. Post procedure Diagnosis Wound #2: Same as Pre-Procedure Plan Follow-up Appointments: Return Appointment in 1 week. Nurse Visit as needed Bathing/ Shower/ Hygiene: Wash wounds with antibacterial soap and water. Mackenzie [name], Mackenzie [name] [place] ([id number]) [id number] Page 6 of 7 May shower with wound dressing protected with water repellent cover or cast protector. No tub bath. Anesthetic (Use 'Patient Medications' Section for Anesthetic Order Entry): Lidocaine applied to wound bed Edema Control - Lymphedema / Segmental Compressive Device / Other: 3 Layer Compression System for Lymphedema. Elevate, Exercise Daily and Avoid Standing for Long Periods of Time. Elevate legs to the level of the heart and pump ankles as often as possible WOUND #2: - Lower Leg Wound Laterality: Right, Anterior Cleanser: Soap and Water 3 x Per Week/30 Days Discharge Instructions: Gently cleanse wound with antibacterial soap, rinse and pat dry prior to dressing wounds Prim Dressing: Xeroform-HBD 2x2 (in/in) 3 x Per Week/30 Days ary Discharge Instructions: Apply Xeroform-HBD 2x2 (in/in) as directed Secondary Dressing: ABD Pad 5x9 (in/in) 3 x Per Week/30 Days Discharge Instructions: Cover with ABD pad Com pression Wrap: Urgo K2 Lite, two layer compression system, regular 3 x Per Week/30 Days WOUND #3: - Lower Leg Wound Laterality: Left, Lateral Cleanser: Soap  and Water 3 x Per Week/30 Days Discharge Instructions: Gently cleanse wound with antibacterial soap, rinse and pat dry prior to dressing wounds Prim Dressing: Xeroform-HBD 2x2 (in/in) 3 x Per Week/30 Days ary Discharge Instructions: Apply Xeroform-HBD 2x2 (in/in) as directed Secondary Dressing: ABD Pad 5x9 (in/in) 3 x Per Week/30 Days Discharge Instructions: Cover with ABD pad Secured With: Conform 4'' - Conforming Stretch Gauze Bandage 4x75  (in/in) 3 x Per Week/30 Days Discharge Instructions: Apply as directed 1. Xeroform under 3 layer compressionooright lower extremity 2. Xeroformooleft lower extremity 3. Compression stocking daily to the left 4. Follow-up in 1 week Electronic Signature(s) Signed: 01/08/2023 12:31:39 PM By: Mackenzie Corwin DO Entered By: Mackenzie [name] on 01/08/2023 12:03:17 -------------------------------------------------------------------------------- ROS/PFSH Details Patient Name: Date of Service: Mackenzie [name], Mackenzie [name] 01/08/2023 11:00 A M Medical Record Number: [id number] Patient Account Number: [id number] Date of Birth/Sex: Treating RN: [birth year] (89 y.o. Mackenzie [name] Primary Care Provider: Kerby [name] Other Clinician: Betha [name] Referring Provider: Treating Provider/Extender: Mackenzie [name], Mackenzie [name] in Treatment: 13 Information Obtained From Patient Eyes Medical History: Positive for: Cataracts Cardiovascular Medical History: Positive for: Arrhythmia - Afib Endocrine Medical History: Negative for: Type I Diabetes; Type II Diabetes Musculoskeletal Medical History: Positive for: Osteoarthritis Neurologic Medical History: Positive for: Neuropathy HBO Extended History Items Eyes: TIMEA, BREED ([id number]) [id number] Page 7 of 7 Cataracts Immunizations Pneumococcal Vaccine: Received Pneumococcal Vaccination: Yes Received Pneumococcal Vaccination On or After 60th Birthday: Yes Implantable Devices None Family and Social History Former smoker; Alcohol Use: Never; Drug Use: No History; Caffeine Use: Rarely Electronic Signature(s) Signed: 01/08/2023 12:31:39 PM By: Mackenzie Corwin DO Signed: 01/08/2023 4:54:10 PM By: Elliot Gurney, BSN, RN, CWS, Kim RN, BSN Entered By: Mackenzie [name] on 01/08/2023 12:04:24 -------------------------------------------------------------------------------- SuperBill Details Patient Name: Date of Service: Mackenzie [name],  Mackenzie [name] 01/08/2023 Medical Record Number: [id number] Patient Account Number: [id number] Date of Birth/Sex: Treating RN: 03-Mar-1932 (89 y.o. Mackenzie [name], Mackenzie [name] Primary Care Provider: Kerby [name] Other Clinician: Betha [name] Referring Provider: Treating Provider/Extender: Mackenzie [name], Mackenzie [name] in Treatment: 13 Diagnosis Coding ICD-10 Codes Code Description [phone] Non-pressure chronic ulcer of other part of right lower leg with fat layer exposed I87.311 Chronic venous hypertension (idiopathic) with ulcer of right lower extremity S81.802A Unspecified open wound, left lower leg, initial encounter I87.312 Chronic venous hypertension (idiopathic) with ulcer of left lower extremity I48.20 Chronic atrial fibrillation, unspecified Z79.01 Long term (current) use of anticoagulants Facility Procedures : CPT4 Code: [id number] Description: (Facility Use Only) [phone] - APPLY MULTLAY COMPRS LWR RT LEG Modifier: Quantity: 1 Physician Procedures : CPT4 Code Description Modifier [id number] - WC PHYS LEVEL 3 - EST PT ICD-10 Diagnosis Description L97.812 Non-pressure chronic ulcer of other part of right lower leg with fat layer exposed I87.311 Chronic venous hypertension (idiopathic) with ulcer  of right lower extremity S81.802A Unspecified open wound, left lower leg, initial encounter I87.312 Chronic venous hypertension (idiopathic) with ulcer of left lower extremity Quantity: 1 Electronic Signature(s) Signed: 01/08/2023 12:31:39 PM By: Mackenzie Corwin DO Entered By: Mackenzie [name] on 01/08/2023 12:04:06

## 2023-01-12 ENCOUNTER — Other Ambulatory Visit: Payer: Self-pay | Admitting: Family Medicine

## 2023-01-15 ENCOUNTER — Encounter: Payer: PPO | Attending: Internal Medicine | Admitting: Internal Medicine

## 2023-01-15 DIAGNOSIS — L97812 Non-pressure chronic ulcer of other part of right lower leg with fat layer exposed: Secondary | ICD-10-CM | POA: Diagnosis not present

## 2023-01-15 DIAGNOSIS — I87313 Chronic venous hypertension (idiopathic) with ulcer of bilateral lower extremity: Secondary | ICD-10-CM | POA: Diagnosis not present

## 2023-01-15 DIAGNOSIS — Z7901 Long term (current) use of anticoagulants: Secondary | ICD-10-CM | POA: Insufficient documentation

## 2023-01-15 DIAGNOSIS — Z87891 Personal history of nicotine dependence: Secondary | ICD-10-CM | POA: Diagnosis not present

## 2023-01-15 DIAGNOSIS — I87311 Chronic venous hypertension (idiopathic) with ulcer of right lower extremity: Secondary | ICD-10-CM

## 2023-01-15 DIAGNOSIS — I87312 Chronic venous hypertension (idiopathic) with ulcer of left lower extremity: Secondary | ICD-10-CM

## 2023-01-15 DIAGNOSIS — I482 Chronic atrial fibrillation, unspecified: Secondary | ICD-10-CM | POA: Diagnosis not present

## 2023-01-15 DIAGNOSIS — S81802A Unspecified open wound, left lower leg, initial encounter: Secondary | ICD-10-CM

## 2023-01-15 DIAGNOSIS — W228XXA Striking against or struck by other objects, initial encounter: Secondary | ICD-10-CM | POA: Diagnosis not present

## 2023-01-16 NOTE — Progress Notes (Signed)
AKEMI, OVERHOLSER [place] ([id number]) [id number] Page 1 of 7 Visit Report for 01/15/2023 Chief Complaint Document Details Patient Name: Date of Service: [name], [name] 01/15/2023 2:15 PM Medical Record Number: [id number] Patient Account Number: [id number] Date of Birth/Sex: Treating RN: 04-10-1932 (89 y.o. [name] Primary Care Provider: Kerby Nora Other Clinician: Referring Provider: Treating Provider/Extender: Madie Reno, [name] in Treatment: 14 Information Obtained from: Patient Chief Complaint 10/09/2022; right lower extremity wound, 12/25/2022; left lower extremity wound Electronic Signature(s) Signed: 01/15/2023 3:04:48 PM By: Geralyn Corwin DO Entered By: Geralyn Corwin on 01/15/2023 14:31:14 -------------------------------------------------------------------------------- HPI Details Patient Name: Date of Service: [name], [name] 01/15/2023 2:15 PM Medical Record Number: [id number] Patient Account Number: [id number] Date of Birth/Sex: Treating RN: 15-Apr-1932 (89 y.o. [name] Primary Care Provider: Kerby Nora Other Clinician: Referring Provider: Treating Provider/Extender: Madie Reno, [name] in Treatment: 14 History of Present Illness HPI Description: 05/08/2021 upon evaluation today patient appears for initial inspection here in the clinic concerning issues that she has been having actually with a wound over the anterior portion of her right leg. She tells me that this was due to an injury where she struck this on something causing the initial trauma and she has been having trouble getting it healed. [name] over at [facility] has been helping take care of this. With that being said because it was not healing quite as quickly and effectively is what she wanted to see she did want [place] to take a look and make any recommendations to try to help things along. I am definitely happy to do that for sure. Fortunately  there does not appear to be any signs of active infection at this time which is great news. The patient also does not seem to have too deep of the wound which is also good news. Her biggest concern is she really does not want to come weekly to the clinic especially since she has care at the [facility] location as far as getting the dressing changed and keeping an eye on it. She does have a history of atrial fibrillation for which she is on warfarin and she does not like to do water aerobics twice a week although she is not able to right now due to the fact that she has this wound and cannot get in the water. That is a significant issue for her at this point. Otherwise does have noncompressible pulses based on what we are seeing today. She wears compression socks for her chronic venous insufficiency she has had previous ablation therapy. 05/29/2021 upon evaluation today patient appears to be doing well with regard to her wound. Overall this is actually showing signs of good improvement with significant overall decrease in size since last week even. Overall I feel like that she is doing awesome and making excellent progress. Over at [facility] they been taking good care of her. 06/14/2021 upon evaluation today patient appears to be doing excellent in regard to her wound. In fact she is completely healed based on what we are seeing currently. I do not see any signs of active infection at this time which is great news. 10/05/2022 [name] is an 89 year old female with a past medical history of chronic venous insufficiency and atrial fibrillation on warfarin that presents the clinic for a 1-66-month history of nonhealing ulcer to the anterior aspect of the right lower extremity. She states she hit her leg against an object in  the beginning of December. She describes what started out as a hematoma and was debrided by a physician in her facility. She has been using Xeroform to the wound bed. She  currently denies signs of infection. She has been wearing her compression stocking. 1/31; patient presents for follow-up. She has been using Hydrofera Blue and Santyl to the wound bed daily. There is been improvement in wound healing. She is scheduled for ABIs with TBI's today. She has no issues or complaints today. She denies signs of infection. 2/7; patient presents for follow-up. She has been using Hydrofera Blue and Santyl to the wound bed. She had her ABIs with TBI's however the results have not been released. She has no issues or complaints today. 2/14; patient presents for follow-up. She has been using Hydrofera Blue and Santyl to the wound bed. Her ABI resulted and showed noncompressible on the right with a TBI of 0.88 with triphasic waveforms. 2/21; patient presents for follow-up. We have been using Hydrofera Blue and Santyl to the wound bed under 3 layer compression. She has no issues or complaints today. There is been improvement in wound healing. 2/28; patient presents for follow-up. We have been using Hydrofera Blue and Santyl to the wound bed under 3 layer compression to the right lower extremity. There continues to be improvement in wound healing. She has no issues or complaints today. 3/6; patient presents for follow-up. We have been using Hydrofera Blue and Santyl to the wound bed under 3 layer compression to the right lower extremity. She has no issues or complaints today. [name], Mackenzie [place] ([id number]) [id number] Page 2 of 7 3/20; patient presents for follow-up. We have been using Hydrofera Blue and Santyl to the wound bed under 3 layer compression. Wound is cleaner and smaller today. She has no issues or complaints today. She gets the wrap changed 2 additional times a week at her living facility. 3/27; patient presents for follow-up. We have been using Hydrofera Blue and Santyl to the wound bed under 3 layer compression. The wound is smaller today. 4/10;  patient presents for follow-up. We have been using Hydrofera Blue and Santyl to the right lower extremity wound under 3 layer compression. There is been improvement in healing. Unfortunately she is developed a new wound to the left leg. She is not sure how it started she thinks and it happened spontaneously. She has been keeping the area covered. 4/17; patient presents for follow-up. We have been using Hydrofera Blue to the right lower extremity under 3 layer compression. She developed a new wound to the left leg last clinic visit and now she has a new skin tear to the anterior portion of the left leg that she states she caused with her nail. We discussed doing a compression wrap to this leg however she declined. She states the [facility] is sticking to the left lower extremity wound sites. 4/24; patient presents for follow-up. We have been using Hydrofera Blue to the right lower extremity under 3 layer compression. This wound is just about healed. She still has a wound T the left lateral side. She has been using Xeroform here and her compression stocking daily. o 5/1; patient presents for follow-up. Her wound is healed on the right side. She still has open wounds to the left leg. Electronic Signature(s) Signed: 01/15/2023 3:04:48 PM By: Geralyn Corwin DO Entered By: Geralyn Corwin on 01/15/2023 14:31:41 -------------------------------------------------------------------------------- Physical Exam Details Patient Name: Date of Service: [name], [name] 01/15/2023 2:15 PM  Medical Record Number: [id number] Patient Account Number: [id number] Date of Birth/Sex: Treating RN: December 07, 1931 (89 y.o. [name] Primary Care Provider: Kerby Nora Other Clinician: Referring Provider: Treating Provider/Extender: Madie Reno, [name] in Treatment: 14 Constitutional . Cardiovascular . Psychiatric . Notes Right lower extremity: T the anterior aspect there is Epithelization to  the previous wound site. Good edema control. No surrounding signs of infection including o increased warmth, erythema or purulent drainage. Venous stasis dermatitis throughout the leg. T the left lower extremity there is a circular wound to the lateral o distal aspect with granulation tissue. T the anterior aspect this There is also a new area with skin breakdown. Significant hemosiderin staining throughout the o leg. 2+ pitting edema to the knee. No signs of surrounding infection. Electronic Signature(s) Signed: 01/15/2023 3:04:48 PM By: Geralyn Corwin DO Entered By: Geralyn Corwin on 01/15/2023 14:32:38 -------------------------------------------------------------------------------- Physician Orders Details Patient Name: Date of Service: [name], [name] 01/15/2023 2:15 PM Medical Record Number: [id number] Patient Account Number: [id number] Date of Birth/Sex: Treating RN: [birth year] (89 y.o. [name] Primary Care Provider: Kerby Nora Other Clinician: Referring Provider: Treating Provider/Extender: Madie Reno, [name] in Treatment: 99 Verbal / Phone Orders: No Diagnosis Coding ICD-10 Coding Code Description [id number] Non-pressure chronic ulcer of other part of right lower leg with fat layer exposed I87.311 Chronic venous hypertension (idiopathic) with ulcer of right lower extremity S81.802A Unspecified open wound, left lower leg, initial encounter I87.312 Chronic venous hypertension (idiopathic) with ulcer of left lower extremity Walker, [place] [name] ([id number]) [id number] Page 3 of 7 I48.20 Chronic atrial fibrillation, unspecified Z79.01 Long term (current) use of anticoagulants Follow-up Appointments ppointment in: - 2 months Return A Bathing/ Shower/ Hygiene Wash wounds with antibacterial soap and water. Edema Control - Lymphedema / Segmental Compressive Device / Other Other: - Bilateral compression socks during the day Wound  Treatment Wound #3 - Lower Leg Wound Laterality: Left, Lateral Cleanser: Soap and Water Discharge Instructions: Gently cleanse wound with antibacterial soap, rinse and pat dry prior to dressing wounds Secondary Dressing: Coverlet Latex-Free Fabric Adhesive Dressings Discharge Instructions: 1.5 x 2 Electronic Signature(s) Signed: 01/15/2023 3:04:48 PM By: Geralyn Corwin DO Signed: 01/15/2023 4:56:30 PM By: Midge Aver MSN RN CNS WTA Entered By: Midge Aver on 01/15/2023 14:46:14 -------------------------------------------------------------------------------- Problem List Details Patient Name: Date of Service: [name], [name] 01/15/2023 2:15 PM Medical Record Number: [id number] Patient Account Number: [id number] Date of Birth/Sex: Treating RN: 05/06/1932 (89 y.o. [name] Primary Care Provider: Kerby Nora Other Clinician: Referring Provider: Treating Provider/Extender: Madie Reno, [name] in Treatment: 14 Active Problems ICD-10 Encounter Code Description Active Date MDM Diagnosis L97.812 Non-pressure chronic ulcer of other part of right lower leg with fat layer 10/09/2022 No Yes exposed I87.311 Chronic venous hypertension (idiopathic) with ulcer of right lower extremity 10/09/2022 No Yes S81.802A Unspecified open wound, left lower leg, initial encounter 12/25/2022 No Yes I87.312 Chronic venous hypertension (idiopathic) with ulcer of left lower extremity 01/01/2023 No Yes I48.20 Chronic atrial fibrillation, unspecified 10/09/2022 No Yes Z79.01 Long term (current) use of anticoagulants 10/09/2022 No Yes Inactive Problems Resolved Problems HALENA, MOHAR ([id number]) [id number] Page 4 of 7 Electronic Signature(s) Signed: 01/15/2023 3:04:48 PM By: Geralyn Corwin DO Signed: 01/15/2023 4:56:30 PM By: Midge Aver MSN RN CNS WTA Entered By: Midge Aver on 01/15/2023  14:46:21 -------------------------------------------------------------------------------- Progress Note Details Patient Name: Date of Service: [name], [name] 01/15/2023 2:15 PM Medical Record Number: [id number]  Patient Account Number: [id number] Date of Birth/Sex: Treating RN: [birth year] (89 y.o. [name] Primary Care Provider: Kerby Nora Other Clinician: Referring Provider: Treating Provider/Extender: Madie Reno, [name] in Treatment: 14 Subjective Chief Complaint Information obtained from Patient 10/09/2022; right lower extremity wound, 12/25/2022; left lower extremity wound History of Present Illness (HPI) 05/08/2021 upon evaluation today patient appears for initial inspection here in the clinic concerning issues that she has been having actually with a wound over the anterior portion of her right leg. She tells me that this was due to an injury where she struck this on something causing the initial trauma and she has been having trouble getting it healed. [name] over at [facility] has been helping take care of this. With that being said because it was not healing quite as quickly and effectively is what she wanted to see she did want [place] to take a look and make any recommendations to try to help things along. I am definitely happy to do that for sure. Fortunately there does not appear to be any signs of active infection at this time which is great news. The patient also does not seem to have too deep of the wound which is also good news. Her biggest concern is she really does not want to come weekly to the clinic especially since she has care at the [facility] location as far as getting the dressing changed and keeping an eye on it. She does have a history of atrial fibrillation for which she is on warfarin and she does not like to do water aerobics twice a week although she is not able to right now due to the fact that she has this wound and cannot get in the water.  That is a significant issue for her at this point. Otherwise does have noncompressible pulses based on what we are seeing today. She wears compression socks for her chronic venous insufficiency she has had previous ablation therapy. 05/29/2021 upon evaluation today patient appears to be doing well with regard to her wound. Overall this is actually showing signs of good improvement with significant overall decrease in size since last week even. Overall I feel like that she is doing awesome and making excellent progress. Over at [facility] they been taking good care of her. 06/14/2021 upon evaluation today patient appears to be doing excellent in regard to her wound. In fact she is completely healed based on what we are seeing currently. I do not see any signs of active infection at this time which is great news. 10/05/2022 [name] is an 89 year old female with a past medical history of chronic venous insufficiency and atrial fibrillation on warfarin that presents the clinic for a 1-63-month history of nonhealing ulcer to the anterior aspect of the right lower extremity. She states she hit her leg against an object in the beginning of December. She describes what started out as a hematoma and was debrided by a physician in her facility. She has been using Xeroform to the wound bed. She currently denies signs of infection. She has been wearing her compression stocking. 1/31; patient presents for follow-up. She has been using Hydrofera Blue and Santyl to the wound bed daily. There is been improvement in wound healing. She is scheduled for ABIs with TBI's today. She has no issues or complaints today. She denies signs of infection. 2/7; patient presents for follow-up. She has been using Hydrofera Blue and Santyl to the wound bed. She had  her ABIs with TBI's however the results have not been released. She has no issues or complaints today. 2/14; patient presents for follow-up. She has been using  Hydrofera Blue and Santyl to the wound bed. Her ABI resulted and showed noncompressible on the right with a TBI of 0.88 with triphasic waveforms. 2/21; patient presents for follow-up. We have been using Hydrofera Blue and Santyl to the wound bed under 3 layer compression. She has no issues or complaints today. There is been improvement in wound healing. 2/28; patient presents for follow-up. We have been using Hydrofera Blue and Santyl to the wound bed under 3 layer compression to the right lower extremity. There continues to be improvement in wound healing. She has no issues or complaints today. 3/6; patient presents for follow-up. We have been using Hydrofera Blue and Santyl to the wound bed under 3 layer compression to the right lower extremity. She has no issues or complaints today. 3/20; patient presents for follow-up. We have been using Hydrofera Blue and Santyl to the wound bed under 3 layer compression. Wound is cleaner and smaller today. She has no issues or complaints today. She gets the wrap changed 2 additional times a week at her living facility. 3/27; patient presents for follow-up. We have been using Hydrofera Blue and Santyl to the wound bed under 3 layer compression. The wound is smaller today. 4/10; patient presents for follow-up. We have been using Hydrofera Blue and Santyl to the right lower extremity wound under 3 layer compression. There is been improvement in healing. Unfortunately she is developed a new wound to the left leg. She is not sure how it started she thinks and it happened spontaneously. She has been keeping the area covered. 4/17; patient presents for follow-up. We have been using Hydrofera Blue to the right lower extremity under 3 layer compression. She developed a new wound to the left leg last clinic visit and now she has a new skin tear to the anterior portion of the left leg that she states she caused with her nail. We discussed doing a compression wrap to this  leg however she declined. She states the [facility] is sticking to the left lower extremity wound sites. 4/24; patient presents for follow-up. We have been using Hydrofera Blue to the right lower extremity under 3 layer compression. This wound is just about healed. She still has a wound T the left lateral side. She has been using Xeroform here and her compression stocking daily. o 5/1; patient presents for follow-up. Her wound is healed on the right side. She still has open wounds to the left leg. AMERE, IOTT [place] ([id number]) [id number] Page 5 of 7 Objective Constitutional Vitals Time Taken: 2:11 PM, Height: 68 in, Weight: 137 lbs, BMI: 20.8, Temperature: 97.8 F, Pulse: 99 bpm, Respiratory Rate: 16 breaths/min, Blood Pressure: 109/68 mmHg. General Notes: Right lower extremity: T the anterior aspect there is Epithelization to the previous wound site. Good edema control. No surrounding signs of o infection including increased warmth, erythema or purulent drainage. Venous stasis dermatitis throughout the leg. T the left lower extremity there is a circular o wound to the lateral distal aspect with granulation tissue. T the anterior aspect this There is also a new area with skin breakdown. Significant hemosiderin o staining throughout the leg. 2+ pitting edema to the knee. No signs of surrounding infection. Integumentary (Hair, Skin) Wound #2 status is Healed - Epithelialized. Original cause of wound was Skin Tear/Laceration. The date acquired was: 08/30/2022. The  wound has been in treatment 14 weeks. The wound is located on the Right,Anterior Lower Leg. The wound measures 0cm length x 0cm width x 0cm depth; 0cm^2 area and 0cm^3 volume. There is Fat Layer (Subcutaneous Tissue) exposed. There is a medium amount of serosanguineous drainage noted. There is small (1-33%) red, pink granulation within the wound bed. There is a large (67-100%) amount of necrotic tissue  within the wound bed. Wound #3 status is Open. Original cause of wound was Shear/Friction. The date acquired was: 12/20/2022. The wound has been in treatment 3 weeks. The wound is located on the Left,Lateral Lower Leg. The wound measures 1cm length x 0.5cm width x 0.1cm depth; 0.393cm^2 area and 0.039cm^3 volume. There is Fat Layer (Subcutaneous Tissue) exposed. There is a small amount of serosanguineous drainage noted. There is small (1-33%) pink granulation within the wound bed. There is a small (1-33%) amount of necrotic tissue within the wound bed including Adherent Slough. Assessment Active Problems ICD-10 Non-pressure chronic ulcer of other part of right lower leg with fat layer exposed Chronic venous hypertension (idiopathic) with ulcer of right lower extremity Unspecified open wound, left lower leg, initial encounter Chronic venous hypertension (idiopathic) with ulcer of left lower extremity Chronic atrial fibrillation, unspecified Long term (current) use of anticoagulants Patient's original wound on the right leg has healed. She has done well with Xeroform under compression wrap. Left lower extremity wound is stable. She has a new site as well. I again recommended doing a compression wrap to the left leg however patient declines this today. In fact patient does not want to follow weekly in our clinic for these wounds. She is hoping that she and her nurse can heal them. I did offer her the option of scheduling an appointment to be seen in 2 months. If the wounds are healed, great and she can cancel this appointment if not then we can start treating them. She was agreeable to this plan. Plan 1. Follow-up in 2 months 2. Can continue Xeroform to the left leg under compression stocking 3. Patient knows to call with any questions or concerns Electronic Signature(s) Signed: 01/15/2023 3:04:48 PM By: Geralyn Corwin DO Entered By: Geralyn Corwin on 01/15/2023  14:40:47 -------------------------------------------------------------------------------- ROS/PFSH Details Patient Name: Date of Service: [name], [name] 01/15/2023 2:15 PM Medical Record Number: [id number] Patient Account Number: [id number] Date of Birth/Sex: Treating RN: 09/16/1932 (89 y.o. [name] Primary Care Provider: Kerby Nora Other Clinician: Referring Provider: Treating Provider/Extender: Madie Reno, [name] in Treatment: [address] Information Obtained From Patient KATIEANN, HUNGATE ([id number]) [id number] Page 6 of 7 Eyes Medical History: Positive for: Cataracts Cardiovascular Medical History: Positive for: Arrhythmia - Afib Endocrine Medical History: Negative for: Type I Diabetes; Type II Diabetes Musculoskeletal Medical History: Positive for: Osteoarthritis Neurologic Medical History: Positive for: Neuropathy HBO Extended History Items Eyes: Cataracts Immunizations Pneumococcal Vaccine: Received Pneumococcal Vaccination: Yes Received Pneumococcal Vaccination On or After 60th Birthday: Yes Implantable Devices None Family and Social History Former smoker; Alcohol Use: Never; Drug Use: No History; Caffeine Use: Rarely Electronic Signature(s) Signed: 01/15/2023 3:04:48 PM By: Geralyn Corwin DO Signed: 01/15/2023 4:56:30 PM By: Midge Aver MSN RN CNS WTA Entered By: Geralyn Corwin on 01/15/2023 14:43:19 -------------------------------------------------------------------------------- SuperBill Details Patient Name: Date of Service: [name], [name] 01/15/2023 Medical Record Number: [id number] Patient Account Number: [id number] Date of Birth/Sex: Treating RN: 03/28/1932 (89 y.o. [name] Primary Care Provider: Kerby Nora Other Clinician: Referring Provider: Treating Provider/Extender: Mikey Bussing,  Clover Mealy, [name] in Treatment: 14 Diagnosis Coding ICD-10 Codes Code Description [phone]  Non-pressure chronic ulcer of other part of right lower leg with fat layer exposed I87.311 Chronic venous hypertension (idiopathic) with ulcer of right lower extremity S81.802A Unspecified open wound, left lower leg, initial encounter I87.312 Chronic venous hypertension (idiopathic) with ulcer of left lower extremity I48.20 Chronic atrial fibrillation, unspecified Z79.01 Long term (current) use of anticoagulants Facility Procedures : [place], Mackenzie CPT4 Code: [id number] Mahlon Gammon ([id number]) Description: 99213 - WOUND CARE VISIT-LEV 3 EST PT [id number] Modifier: [id number] Quantity: 1 [id number] Page 7 of 7 Physician Procedures : CPT4 Code Description Modifier [phone] 99213 - WC PHYS LEVEL 3 - EST PT ICD-10 Diagnosis Description L97.812 Non-pressure chronic ulcer of other part of right lower leg with fat layer exposed I87.311 Chronic venous 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

## 2023-01-16 NOTE — Progress Notes (Addendum)
[name], [name] [place] ([id number]) [id number] Page 1 of 10 Visit Report for 01/15/2023 Arrival Information Details Patient Name: Date of Service: [name] [name], [name] 01/15/2023 2:15 PM Medical Record Number: [id number] Patient Account Number: [id number] Date of Birth/Sex: Treating RN: 05-Jul-1932 (89 y.o. [name] Primary Care Shannyn Jankowiak: Kerby Nora Other Clinician: Referring Erine Phenix: Treating Julez Huseby/Extender: Madie Reno, [name] in Treatment: 14 Visit Information History Since Last Visit Added or deleted any medications: No Patient Arrived: Cane Has Dressing in Place as Prescribed: Yes Arrival Time: 14:08 Pain Present Now: No Accompanied By: self Transfer Assistance: None Patient Identification Verified: Yes Secondary Verification Process Completed: Yes Patient Requires Transmission-Based Precautions: No Patient Has Alerts: Yes Patient Alerts: Patient on Blood Thinner Warfarin Left ABI: 1.14 Electronic Signature(s) Signed: 01/15/2023 4:56:30 PM By: Midge Aver MSN RN CNS WTA Entered By: Midge Aver on 01/15/2023 11:09:19 -------------------------------------------------------------------------------- Clinic Level of Care Assessment Details Patient Name: Date of Service: [name] [name], [place] [name] 01/15/2023 2:15 PM Medical Record Number: [id number] Patient Account Number: [id number] Date of Birth/Sex: Treating RN: 1932-09-01 (89 y.o. [name] Primary Care Malanie Koloski: Kerby Nora Other Clinician: Referring Kele Withem: Treating Koua Deeg/Extender: Madie Reno, [name] in Treatment: 14 Clinic Level of Care Assessment Items TOOL 4 Quantity Score X- 1 0 Use when only an EandM is performed on FOLLOW-UP visit ASSESSMENTS - Nursing Assessment / Reassessment X- 1 10 Reassessment of Co-morbidities (includes updates in patient status) X- 1 5 Reassessment of Adherence to Treatment Plan ASSESSMENTS - Wound and Skin A ssessment /  Reassessment X - Simple Wound Assessment / Reassessment - one wound 1 5 [name], [name] ([id number]) [id number] Page 2 of 10 []  - 0 Complex Wound Assessment / Reassessment - multiple wounds []  - 0 Dermatologic / Skin Assessment (not related to wound area) ASSESSMENTS - Focused Assessment []  - 0 Circumferential Edema Measurements - multi extremities []  - 0 Nutritional Assessment / Counseling / Intervention []  - 0 Lower Extremity Assessment (monofilament, tuning fork, pulses) []  - 0 Peripheral Arterial Disease Assessment (using hand held doppler) ASSESSMENTS - Ostomy and/or Continence Assessment and Care []  - 0 Incontinence Assessment and Management []  - 0 Ostomy Care Assessment and Management (repouching, etc.) PROCESS - Coordination of Care X - Simple Patient / Family Education for ongoing care 1 15 []  - 0 Complex (extensive) Patient / Family Education for ongoing care X- 1 10 Staff obtains Chiropractor, Records, T Results / Process Orders est []  - 0 Staff telephones HHA, Nursing Homes / Clarify orders / etc []  - 0 Routine Transfer to another Facility (non-emergent condition) []  - 0 Routine Hospital Admission (non-emergent condition) []  - 0 New Admissions / Manufacturing engineer / Ordering NPWT Apligraf, etc. , []  - 0 Emergency Hospital Admission (emergent condition) X- 1 10 Simple Discharge Coordination []  - 0 Complex (extensive) Discharge Coordination PROCESS - Special Needs []  - 0 Pediatric / Minor Patient Management []  - 0 Isolation Patient Management []  - 0 Hearing / Language / Visual special needs []  - 0 Assessment of Community assistance (transportation, D/C planning, etc.) []  - 0 Additional assistance / Altered mentation []  - 0 Support Surface(s) Assessment (bed, cushion, seat, etc.) INTERVENTIONS - Wound Cleansing / Measurement X - Simple Wound Cleansing - one wound 1 5 []  - 0 Complex Wound Cleansing - multiple wounds X- 1  5 Wound Imaging (photographs - any number of wounds) []  - 0 Wound Tracing (instead of photographs) X- 1 5 Simple Wound Measurement - one wound []  -  0 Complex Wound Measurement - multiple wounds INTERVENTIONS - Wound Dressings X - Small Wound Dressing one or multiple wounds 1 10 []  - 0 Medium Wound Dressing one or multiple wounds []  - 0 Large Wound Dressing one or multiple wounds []  - 0 Application of Medications - topical []  - 0 Application of Medications - injection INTERVENTIONS - Miscellaneous []  - 0 External ear exam []  - 0 Specimen Collection (cultures, biopsies, blood, body fluids, etc.) [name], [name] ([id number]) [id number] Page 3 of 10 []  - 0 Specimen(s) / Culture(s) sent or taken to Lab for analysis []  - 0 Patient Transfer (multiple staff / Michiel Sites Lift / Similar devices) []  - 0 Simple Staple / Suture removal (25 or less) []  - 0 Complex Staple / Suture removal (26 or more) []  - 0 Hypo / Hyperglycemic Management (close monitor of Blood Glucose) []  - 0 Ankle / Brachial Index (ABI) - do not check if billed separately X- 1 5 Vital Signs Has the patient been seen at the hospital within the last three years: Yes Total Score: 85 Level Of Care: New/Established - Level 3 Electronic Signature(s) Signed: 05/16/2023 2:46:29 PM By: Midge Aver MSN RN CNS WTA Previous Signature: 01/15/2023 4:56:30 PM Version By: Midge Aver MSN RN CNS WTA Entered By: Midge Aver on 04/09/2023 11:56:55 -------------------------------------------------------------------------------- Encounter Discharge Information Details Patient Name: Date of Service: [name] FF, [name] [name] [name] [name] 01/15/2023 2:15 PM Medical Record Number: [id number] Patient Account Number: [id number] Date of Birth/Sex: Treating RN: 26-Mar-1932 (89 y.o. [name] Primary Care Kyndall Chaplin: Kerby Nora Other Clinician: Referring Heike Pounds: Treating Maleik Vanderzee/Extender: Madie Reno, [name] in  Treatment: 20 Encounter Discharge Information Items Discharge Condition: Stable Ambulatory Status: Cane Discharge Destination: Home Transportation: Other Accompanied By: self Schedule Follow-up Appointment: Yes Clinical Summary of Care: Electronic Signature(s) Signed: 01/15/2023 4:56:30 PM By: Midge Aver MSN RN CNS WTA Entered By: Midge Aver on 01/15/2023 11:46:56 -------------------------------------------------------------------------------- Lower Extremity Assessment Details Patient Name: Date of Service: [name] FF, [name] [name] [name] [name] 01/15/2023 2:15 PM Medical Record Number: [id number] Patient Account Number: [id number] JYLL, MONROE ([id number]) [id number] Page 4 of 10 Date of Birth/Sex: Treating RN: 1932-06-20 (89 y.o. [name] Primary Care Tarell Schollmeyer: Other Clinician: Kerby Nora Referring Aariyah Sampey: Treating Orelia Brandstetter/Extender: Madie Reno, [name] in Treatment: 14 Edema Assessment Assessed: [Left: No] [Right: No] [Left: Edema] [Right: :] Calf Left: Right: Point of Measurement: 34 cm From Medial Instep 29 cm Ankle Left: Right: Point of Measurement: 12 cm From Medial Instep 19 cm Vascular Assessment Pulses: Dorsalis Pedis Palpable: [Right:Yes] Electronic Signature(s) Signed: 01/15/2023 4:56:30 PM By: Midge Aver MSN RN CNS WTA Entered By: Midge Aver on 01/15/2023 11:38:52 -------------------------------------------------------------------------------- Multi Wound Chart Details Patient Name: Date of Service: [name] FF, [name] [name] [name] [name] 01/15/2023 2:15 PM Medical Record Number: [id number] Patient Account Number: [id number] Date of Birth/Sex: Treating RN: 1931-10-08 (89 y.o. [name] Primary Care Nicoles Sedlacek: Kerby Nora Other Clinician: Referring Deke Tilghman: Treating Margeaux Swantek/Extender: Madie Reno, [name] in Treatment: 14 Vital Signs Height(in): 68 Pulse(bpm): 99 Weight(lbs): 137 Blood Pressure(mmHg): 109/68 Body  Mass Index(BMI): 20.8 Temperature(F): 97.8 Respiratory Rate(breaths/min): 16 [2:Photos: No Photos] [N/A:N/A] Right, Anterior Lower Leg Left, Lateral Lower Leg N/A Wound Location: Skin Tear/Laceration Shear/Friction N/A Wounding Event: Skin Tear Venous Leg Ulcer N/A Primary Etiology: Cataracts, Arrhythmia, Osteoarthritis, Cataracts, Arrhythmia, Osteoarthritis, N/A Comorbid HistoryABBRIELLE, RENTAS ([id number]) [id number] Page 5 of 10 Neuropathy Neuropathy 08/30/2022 12/20/2022 N/A Date Acquired: 14 3 N/A Weeks of Treatment: Healed - Epithelialized Open  N/A Wound Status: No No N/A Wound Recurrence: 0x0x0 1x0.5x0.1 N/A Measurements L x W x D (cm) 0 0.393 N/A A (cm) : rea 0 0.039 N/A Volume (cm) : 100.00% 30.40% N/A % Reduction in Area: 100.00% 31.60% N/A % Reduction in Volume: Full Thickness Without Exposed Partial Thickness N/A Classification: Support Structures Medium Small N/A Exudate Amount: Serosanguineous Serosanguineous N/A Exudate Type: red, brown red, brown N/A Exudate Color: Small (1-33%) Small (1-33%) N/A Granulation Amount: Red, Pink Pink N/A Granulation Quality: Large (67-100%) Small (1-33%) N/A Necrotic Amount: Fat Layer (Subcutaneous Tissue): Yes Fat Layer (Subcutaneous Tissue): Yes N/A Exposed Structures: Fascia: No Fascia: No Tendon: No Tendon: No Muscle: No Muscle: No Joint: No Joint: No Bone: No Bone: No Small (1-33%) N/A N/A Epithelialization: Treatment Notes Electronic Signature(s) Signed: 01/15/2023 4:56:30 PM By: Midge Aver MSN RN CNS WTA Entered By: Midge Aver on 01/15/2023 11:42:51 -------------------------------------------------------------------------------- Multi-Disciplinary Care Plan Details Patient Name: Date of Service: [name] FF, [name] [name] [name] [name] 01/15/2023 2:15 PM Medical Record Number: [id number] Patient Account Number: [id number] Date of Birth/Sex: Treating RN: Mar 18, 1932 (89 y.o. [name] Primary Care Levante Simones: Kerby Nora Other Clinician: Referring Rosalynd Mcwright: Treating Ariabella Brien/Extender: Madie Reno, [name] in Treatment: 14 Active Inactive Electronic Signature(s) Signed: 04/09/2023 2:57:30 PM By: Midge Aver MSN RN CNS WTA Previous Signature: 01/15/2023 4:56:30 PM Version By: Midge Aver MSN RN CNS WTA Entered By: Midge Aver on 04/09/2023 11:57:30 Laster, [name] ([id number]) [id number] Page 6 of 10 -------------------------------------------------------------------------------- Pain Assessment Details Patient Name: Date of Service: [name] [name], [place] [name] 01/15/2023 2:15 PM Medical Record Number: [id number] Patient Account Number: [id number] Date of Birth/Sex: Treating RN: [birth year] (89 y.o. [name] Primary Care Nikodem Leadbetter: Kerby Nora Other Clinician: Referring Shannelle Alguire: Treating Sharonne Ricketts/Extender: Madie Reno, [name] in Treatment: 63 Active Problems Location of Pain Severity and Description of Pain Patient Has Paino No Site Locations Pain Management and Medication Current Pain Management: Electronic Signature(s) Signed: 01/15/2023 4:56:30 PM By: Midge Aver MSN RN CNS WTA Entered By: Midge Aver on 01/15/2023 11:11:50 -------------------------------------------------------------------------------- Patient/Caregiver Education Details Patient Name: Date of Service: [name] FF, [name] Wyona Almas [name] 5/1/2024andnbsp2:15 PM Medical Record Number: [id number] Patient Account Number: [id number] Date of Birth/Gender: Treating RN: April 08, 1932 (89 y.o. [name] Primary Care Physician: Kerby Nora Other Clinician: Referring Physician: Treating Physician/Extender: Madie Reno, [name] in Treatment: [address], [place] ([id number]) [id number] Page 7 of 10 Education Assessment Education Provided To: Patient Education Topics Provided Discharge Packet: Methods:  Explain/Verbal Responses: State content correctly [employer]) Signed: 05/16/2023 2:46:29 PM By: Midge Aver MSN RN CNS WTA Previous Signature: 01/15/2023 4:56:30 PM Version By: Midge Aver MSN RN CNS WTA Entered By: Midge Aver on 04/09/2023 11:58:03 -------------------------------------------------------------------------------- Wound Assessment Details Patient Name: Date of Service: [name] FF, [name] [name] [name] [name] 01/15/2023 2:15 PM Medical Record Number: [id number] Patient Account Number: [id number] Date of Birth/Sex: Treating RN: 02/11/1932 (89 y.o. [name] Primary Care Sabryna Lahm: Kerby Nora Other Clinician: Referring Ronel Rodeheaver: Treating Pablo Mathurin/Extender: Madie Reno, [name] in Treatment: 14 Wound Status Wound Number: 2 Primary Etiology: Skin Tear Wound Location: Right, Anterior Lower Leg Wound Status: Healed - Epithelialized Wounding Event: Skin Tear/Laceration Comorbid History: Cataracts, Arrhythmia, Osteoarthritis, Neuropathy Date Acquired: 08/30/2022 Weeks Of Treatment: 14 Clustered Wound: No Wound Measurements Length: (cm) Width: (cm) Depth: (cm) Area: (cm) Volume: (cm) 0 % Reduction in Area: 100% 0 % Reduction in Volume: 100% 0 Epithelialization: Small (1-33%) 0 0 Wound Description Classification: Full Thickness Without  Exposed Suppor Exudate Amount: Medium Exudate Type: Serosanguineous Exudate Color: red, brown t Structures Foul Odor After Cleansing: No Slough/Fibrino Yes Wound Bed Granulation Amount: Small (1-33%) Exposed Structure Granulation Quality: Red, Pink Fascia Exposed: No Necrotic Amount: Large (67-100%) Fat Layer (Subcutaneous Tissue) Exposed: Yes Tendon Exposed: No Muscle Exposed: No Joint Exposed: No Bone Exposed: No Treatment Notes Wound #2 (Lower Leg) Wound Laterality: Right, Anterior Cherney, [name] ([id number]) [id number] Page 8 of 10 Cleanser Peri-Wound Care Topical Primary  Dressing Secondary Dressing Secured With Compression Wrap Compression Stockings Add-Ons Electronic Signature(s) Signed: 01/15/2023 4:56:30 PM By: Midge Aver MSN RN CNS WTA Entered By: Midge Aver on 01/15/2023 11:28:22 -------------------------------------------------------------------------------- Wound Assessment Details Patient Name: Date of Service: [name] FF, [name] [name] [name] [name] 01/15/2023 2:15 PM Medical Record Number: [id number] Patient Account Number: [id number] Date of Birth/Sex: Treating RN: 1932/08/18 (89 y.o. [name] Primary Care Emersynn Deatley: Kerby Nora Other Clinician: Referring Ezechiel Stooksbury: Treating Siaosi Alter/Extender: Madie Reno, [name] in Treatment: 14 Wound Status Wound Number: 3 Primary Etiology: Venous Leg Ulcer Wound Location: Left, Lateral Lower Leg Wound Status: Healed - Epithelialized Wounding Event: Shear/Friction Comorbid History: Cataracts, Arrhythmia, Osteoarthritis, Neuropathy Date Acquired: 12/20/2022 Weeks Of Treatment: 3 Clustered Wound: No Photos Wound Measurements Length: (cm) Width: (cm) Depth: (cm) Area: (cm) Volume: (cm) 0 % Reduction in Area: 30.4% 0 % Reduction in Volume: 31.6% 0 0 0 Wound Description Classification: Partial Thickness Exudate Amount: Small Janik, [name] ([id number]) Exudate Amount: Small Exudate Type: Serosanguineous Exudate Color: red, brown Foul Odor After Cleansing: No Slough/Fibrino Yes [id number] Page 9 of 10 Slough/Fibrino Yes Wound Bed Granulation Amount: Small (1-33%) Exposed Structure Granulation Quality: Pink Fascia Exposed: No Necrotic Amount: Small (1-33%) Fat Layer (Subcutaneous Tissue) Exposed: Yes Necrotic Quality: Adherent Slough Tendon Exposed: No Muscle Exposed: No Joint Exposed: No Bone Exposed: No Treatment Notes Wound #3 (Lower Leg) Wound Laterality: Left, Lateral Cleanser Soap and Water Discharge Instruction: Gently cleanse wound with  antibacterial soap, rinse and pat dry prior to dressing wounds Peri-Wound Care Topical Primary Dressing Secondary Dressing Coverlet Latex-Free Fabric Adhesive Dressings Discharge Instruction: 1.5 x 2 Secured With Compression Wrap Compression Stockings Add-Ons Electronic Signature(s) Signed: 04/09/2023 2:55:05 PM By: Midge Aver MSN RN CNS WTA Previous Signature: 01/15/2023 4:56:30 PM Version By: Midge Aver MSN RN CNS WTA Entered By: Midge Aver on 04/09/2023 11:55:05 -------------------------------------------------------------------------------- Vitals Details Patient Name: Date of Service: [name] FF, [name] [name] [name] [name] 01/15/2023 2:15 PM Medical Record Number: [id number] Patient Account Number: [id number] Date of Birth/Sex: Treating RN: May 09, 1932 (89 y.o. [name] Primary Care Knox Cervi: Kerby Nora Other Clinician: Referring Moxie Kalil: Treating Ulysess Witz/Extender: Madie Reno, [name] in Treatment: 14 Vital Signs Time Taken: 14:11 Temperature (F): 97.8 Height (in): 68 Pulse (bpm): 99 Weight (lbs): 137 Respiratory Rate (breaths/min): 16 Body Mass Index (BMI): 20.8 Blood Pressure (mmHg): 109/68 Reference Range: 80 - 120 mg / dl Electronic Signature(s) Signed: 01/15/2023 4:56:30 PM By: Midge Aver MSN RN CNS [address] MARY Oden ([id number]) PM By: Midge Aver MSN RN CNS Margarita Rana [id number].pdf Page 10 of 10 Signed: 01/15/2023 4:56:30 Entered By: Midge Aver on 01/15/2023 11:11:38

## 2023-01-23 ENCOUNTER — Ambulatory Visit (INDEPENDENT_AMBULATORY_CARE_PROVIDER_SITE_OTHER): Payer: PPO

## 2023-01-23 DIAGNOSIS — D802 Selective deficiency of immunoglobulin A [IgA]: Secondary | ICD-10-CM | POA: Diagnosis not present

## 2023-01-23 DIAGNOSIS — Z7901 Long term (current) use of anticoagulants: Secondary | ICD-10-CM | POA: Diagnosis not present

## 2023-01-23 DIAGNOSIS — I482 Chronic atrial fibrillation, unspecified: Secondary | ICD-10-CM | POA: Diagnosis not present

## 2023-01-23 LAB — POCT INR: INR: 2 (ref 2.0–3.0)

## 2023-01-23 NOTE — Progress Notes (Addendum)
Received INR result of 2.0 from [facility] via email. [name] has [facility] draw labs on Mondays and Thursdays. PTT was 19.8.  Continue 1 tablet daily except take 1/2 tablet on Sundays and Thursdays. Recheck in 4 weeks, on 02/20/23. LVM and advised of dosing and when to recheck. Instructions also emailed to [place] and [name] at [facility].

## 2023-01-23 NOTE — Patient Instructions (Addendum)
Pre visit review using our clinic review tool, if applicable. No additional management support is needed unless otherwise documented below in the visit note.  Continue 1 tablet daily except take 1/2 tablet on Sundays and Thursdays. Recheck in 4 weeks, on 02/20/23.

## 2023-02-20 ENCOUNTER — Ambulatory Visit (INDEPENDENT_AMBULATORY_CARE_PROVIDER_SITE_OTHER): Payer: PPO

## 2023-02-20 DIAGNOSIS — Z7901 Long term (current) use of anticoagulants: Secondary | ICD-10-CM

## 2023-02-20 DIAGNOSIS — I482 Chronic atrial fibrillation, unspecified: Secondary | ICD-10-CM | POA: Diagnosis not present

## 2023-02-20 DIAGNOSIS — D802 Selective deficiency of immunoglobulin A [IgA]: Secondary | ICD-10-CM | POA: Diagnosis not present

## 2023-02-20 LAB — POCT INR: INR: 2.6 (ref 2.0–3.0)

## 2023-02-20 NOTE — Patient Instructions (Addendum)
Pre visit review using our clinic review tool, if applicable. No additional management support is needed unless otherwise documented below in the visit note.  Continue 1 tablet daily except take 1/2 tablet on Sundays and Thursdays. Recheck in 4 weeks, on 03/27/23.

## 2023-02-20 NOTE — Progress Notes (Addendum)
Received INR result of 2.6 from [facility] via email. [place] [name] has [facility] draw labs on Mondays and Thursdays. PTT was 25.2.  Continue 1 tablet daily except take 1/2 tablet on Sundays and Thursdays. Recheck in 5 contacted pt by phone and advised of dosing and when to recheck. Instructions also emailed to [place] and [name] at [facility].

## 2023-03-19 ENCOUNTER — Ambulatory Visit: Payer: PPO | Admitting: Internal Medicine

## 2023-04-03 ENCOUNTER — Telehealth: Payer: Self-pay | Admitting: Family Medicine

## 2023-04-03 ENCOUNTER — Ambulatory Visit (INDEPENDENT_AMBULATORY_CARE_PROVIDER_SITE_OTHER): Payer: PPO

## 2023-04-03 DIAGNOSIS — Z7901 Long term (current) use of anticoagulants: Secondary | ICD-10-CM

## 2023-04-03 DIAGNOSIS — I482 Chronic atrial fibrillation, unspecified: Secondary | ICD-10-CM | POA: Diagnosis not present

## 2023-04-03 LAB — POCT INR: INR: 1.9 — AB (ref 2.0–3.0)

## 2023-04-03 MED ORDER — WARFARIN SODIUM 5 MG PO TABS
ORAL_TABLET | ORAL | 1 refills | Status: DC
Start: 2023-04-03 — End: 2023-06-30

## 2023-04-03 NOTE — Progress Notes (Signed)
Received INR result of 1.9 from [facility] via email. [name] has [facility] draw labs on Mondays and Thursdays. PTT was 19.7.  Increase dose today to take 1 tablet and then continue 1 tablet daily except take 1/2 tablet on Sundays and Thursdays. Recheck in 3 weeks, on 04/24/23. LVM and advised of dosing and when to recheck. Instructions also emailed to [place] and [name] at [facility].

## 2023-04-03 NOTE — Patient Instructions (Addendum)
Pre visit review using our clinic review tool, if applicable. No additional management support is needed unless otherwise documented below in the visit note.  Increase dose today to take 1 tablet and then continue 1 tablet daily except take 1/2 tablet on Sundays and Thursdays. Recheck in 3 weeks, on 04/24/23.

## 2023-04-03 NOTE — Telephone Encounter (Signed)
Prescription Request  04/03/2023  LOV: 12/26/2022  What is the name of the medication or equipment? warfarin (COUMADIN) 5 MG tablet [[id number]]   Have you contacted your pharmacy to request a refill? Yes. Pt states she's been told by [facility] that they've tried requesting a refill from our office multiple times.   Which pharmacy would you like this sent to?  [id number] [address] [place] [address] Phone: [phone] Fax: [phone]    Patient notified that their request is being sent to the clinical staff for review and that they should receive a response within 2 business days.   Please advise at Mobile [phone] (mobile)

## 2023-04-03 NOTE — Telephone Encounter (Signed)
There have not been any warfarin refill requests sent to the coumadin clinic.  Pt is compliant with warfarin management and PCP apts.  Sent in refill of warfarin to requested pharmacy.    Contacted pt and advised refill has been sent.  She reports she has contacted the pharmacy and the recording said they are no longer taking calls. Advised this nurse will contact pharmacy and then call her back. Warfarin refill sent do Tried to contact pharmacy but they do not accept calls directly in any longer and a VM must be left with a promise of returning the call within 1 hour.  Received call from [facility] at [facility]. He reports warfarin script has been received and will be filled in the next 20 minutes. Azalia Bilis to make sure pt is set up for phone calls when her script is ready. Loraine Leriche said she was already set up. He reports if pt joined [employer] with [facility] they could deliver her meds but it cost $5 monthly.   Advised pt refill will be ready in 20 minutes and advised pt about [facility]. Advised pt to check into [facility] [facility] [facility] as they may be able to deliver or mail her medication and it may even be cheaper. Pt reports she did read about the pharmacies on mychart and will call them. Pt verbalized understanding and was appreciative of the help.

## 2023-04-04 ENCOUNTER — Other Ambulatory Visit: Payer: Self-pay | Admitting: Family Medicine

## 2023-04-17 ENCOUNTER — Other Ambulatory Visit: Payer: Self-pay | Admitting: Family Medicine

## 2023-04-17 NOTE — Telephone Encounter (Signed)
Last office visit 12/26/2022 for CPE.  Last refilled 10/18/22 for #270 with 1 refill.  Next Appt: CPE 12/30/2023.

## 2023-04-24 DIAGNOSIS — D802 Selective deficiency of immunoglobulin A [IgA]: Secondary | ICD-10-CM | POA: Diagnosis not present

## 2023-04-24 DIAGNOSIS — I482 Chronic atrial fibrillation, unspecified: Secondary | ICD-10-CM | POA: Diagnosis not present

## 2023-04-24 DIAGNOSIS — Z7901 Long term (current) use of anticoagulants: Secondary | ICD-10-CM | POA: Diagnosis not present

## 2023-04-24 LAB — POCT INR: INR: 2.4 (ref 2.0–3.0)

## 2023-04-28 ENCOUNTER — Ambulatory Visit (INDEPENDENT_AMBULATORY_CARE_PROVIDER_SITE_OTHER): Payer: PPO

## 2023-04-28 DIAGNOSIS — Z7901 Long term (current) use of anticoagulants: Secondary | ICD-10-CM
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
## 2023-04-28 NOTE — Patient Instructions (Addendum)
Pre visit review using our clinic review tool, if applicable. No additional management support is needed unless otherwise documented below in the visit note.  Continue 1 tablet daily except take 1/2 tablet on Sundays and Thursdays. Recheck in 5 weeks, on 05/29/23.

## 2023-05-29 ENCOUNTER — Ambulatory Visit (INDEPENDENT_AMBULATORY_CARE_PROVIDER_SITE_OTHER): Payer: PPO

## 2023-05-29 DIAGNOSIS — D802 Selective deficiency of immunoglobulin A [IgA]: Secondary | ICD-10-CM | POA: Diagnosis not present

## 2023-05-29 DIAGNOSIS — Z7901 Long term (current) use of anticoagulants: Secondary | ICD-10-CM

## 2023-05-29 DIAGNOSIS — I482 Chronic atrial fibrillation, unspecified: Secondary | ICD-10-CM | POA: Diagnosis not present

## 2023-05-29 LAB — POCT INR: INR: 2.9 (ref 2.0–3.0)

## 2023-05-29 NOTE — Progress Notes (Signed)
Received INR result of 2.9 from [facility] via email. [name] has [facility] draw labs on Mondays and Thursdays. PTT was 28.7. Continue 1 tablet daily except take 1/2 tablet on Sundays and Thursdays. Recheck in 4 weeks, on 06/26/23. Contacted pt by phone and advised of dosing and recheck. Instructions also emailed to [place] and [name] at [facility].

## 2023-05-29 NOTE — Patient Instructions (Addendum)
Pre visit review using our clinic review tool, if applicable. No additional management support is needed unless otherwise documented below in the visit note.  Continue 1 tablet daily except take 1/2 tablet on Sundays and Thursdays. Recheck in 4 weeks, on 06/26/23.

## 2023-06-07 DIAGNOSIS — I7091 Generalized atherosclerosis: Secondary | ICD-10-CM | POA: Diagnosis not present

## 2023-06-07 DIAGNOSIS — B351 Tinea unguium: Secondary | ICD-10-CM | POA: Diagnosis not present

## 2023-06-26 ENCOUNTER — Ambulatory Visit (INDEPENDENT_AMBULATORY_CARE_PROVIDER_SITE_OTHER): Payer: PPO

## 2023-06-26 DIAGNOSIS — I482 Chronic atrial fibrillation, unspecified: Secondary | ICD-10-CM | POA: Diagnosis not present

## 2023-06-26 DIAGNOSIS — Z7901 Long term (current) use of anticoagulants: Secondary | ICD-10-CM

## 2023-06-26 DIAGNOSIS — D802 Selective deficiency of immunoglobulin A [IgA]: Secondary | ICD-10-CM | POA: Diagnosis not present

## 2023-06-26 LAB — POCT INR: INR: 2.1 (ref 2.0–3.0)

## 2023-06-26 NOTE — Progress Notes (Signed)
Received INR result of 2.1 from [facility] via email. [name] has [facility] draw labs on Mondays and Thursdays. PTT was 21.4. Continue 1 tablet daily except take 1/2 tablet on Sundays and Thursdays. Recheck in 4 weeks, on 07/24/23. LVM with instructions and recheck date. Instructions also emailed to [place] and [name] at [facility].

## 2023-06-26 NOTE — Patient Instructions (Addendum)
Pre visit review using our clinic review tool, if applicable. No additional management support is needed unless otherwise documented below in the visit note.  Continue 1 tablet daily except take 1/2 tablet on Sundays and Thursdays. Recheck in 4 weeks, on 07/24/23.

## 2023-06-27 ENCOUNTER — Other Ambulatory Visit: Payer: Self-pay | Admitting: Family Medicine

## 2023-06-27 DIAGNOSIS — Z7901 Long term (current) use of anticoagulants: Secondary | ICD-10-CM

## 2023-06-30 NOTE — Telephone Encounter (Signed)
Pt is compliant with warfarin management and PCP apts.  Sent in refill of warfarin to requested pharmacy.

## 2023-07-24 DIAGNOSIS — D802 Selective deficiency of immunoglobulin A [IgA]: Secondary | ICD-10-CM | POA: Diagnosis not present

## 2023-07-24 DIAGNOSIS — I482 Chronic atrial fibrillation, unspecified: Secondary | ICD-10-CM | POA: Diagnosis not present

## 2023-07-24 DIAGNOSIS — Z7901 Long term (current) use of anticoagulants: Secondary | ICD-10-CM | POA: Diagnosis not present

## 2023-07-24 LAB — POCT INR: INR: 2.9 (ref 2.0–3.0)

## 2023-07-25 ENCOUNTER — Ambulatory Visit (INDEPENDENT_AMBULATORY_CARE_PROVIDER_SITE_OTHER): Payer: Self-pay

## 2023-07-25 DIAGNOSIS — Z7901 Long term (current) use of anticoagulants: Secondary | ICD-10-CM

## 2023-07-25 NOTE — Patient Instructions (Addendum)
Pre visit review using our clinic review tool, if applicable. No additional management support is needed unless otherwise documented below in the visit note.  Continue 1 tablet daily except take 1/2 tablet on Sundays and Thursdays. Recheck in 4 weeks, on 08/21/23.

## 2023-07-25 NOTE — Progress Notes (Signed)
Received INR result of 2.9 from [facility]  from yesterday via email. [place] [facility] has [facility] draw labs on Mondays and Thursdays. PTT was 29.0. Continue 1 tablet daily except take 1/2 tablet on Sundays and Thursdays. Recheck in 4 weeks, on 08/21/23. LVM with instructions and recheck date. Instructions also emailed to [place] and [name] at [facility].

## 2023-07-28 DIAGNOSIS — Z961 Presence of intraocular lens: Secondary | ICD-10-CM | POA: Diagnosis not present

## 2023-08-16 ENCOUNTER — Encounter: Payer: Self-pay | Admitting: Family Medicine

## 2023-08-18 ENCOUNTER — Other Ambulatory Visit: Payer: Self-pay | Admitting: Family Medicine

## 2023-08-18 MED ORDER — GABAPENTIN 100 MG PO CAPS: ORAL_CAPSULE | ORAL | Status: DC

## 2023-08-21 DIAGNOSIS — I482 Chronic atrial fibrillation, unspecified: Secondary | ICD-10-CM | POA: Diagnosis not present

## 2023-08-21 DIAGNOSIS — Z7901 Long term (current) use of anticoagulants: Secondary | ICD-10-CM | POA: Diagnosis not present

## 2023-08-21 DIAGNOSIS — D802 Selective deficiency of immunoglobulin A [IgA]: Secondary | ICD-10-CM | POA: Diagnosis not present

## 2023-08-21 LAB — POCT INR: INR: 3.3 — AB (ref 2.0–3.0)

## 2023-08-22 ENCOUNTER — Ambulatory Visit (INDEPENDENT_AMBULATORY_CARE_PROVIDER_SITE_OTHER): Payer: PPO

## 2023-08-22 DIAGNOSIS — Z7901 Long term (current) use of anticoagulants: Secondary | ICD-10-CM

## 2023-08-22 NOTE — Progress Notes (Signed)
Received INR result of 3.3 from [facility] from yesterday via email. Email was not received with result until after hours last night. [place] [facility] has [facility] draw labs on Mondays and Thursdays. PTT was 31.9. Hold dose today and then continue 1 tablet daily except take 1/2 tablet on Sundays and Thursdays. Recheck in 2 weeks, on 09/04/23. LVM with instructions and recheck date. Instructions also emailed to [place] and [name] at [facility].

## 2023-08-22 NOTE — Patient Instructions (Addendum)
Pre visit review using our clinic review tool, if applicable. No additional management support is needed unless otherwise documented below in the visit note.  Hold dose today and then continue 1 tablet daily except take 1/2 tablet on Sundays and Thursdays. Recheck in 2 weeks, on 09/04/23.

## 2023-09-01 NOTE — Progress Notes (Signed)
Cardiology Office Note  Date:  09/02/2023   ID:  [name], DOB 03/09/1932, MRN [id number]  PCP:  Mackenzie Seltzer, MD   Chief Complaint  Patient presents with   12 month follow up     Patient c/o shortness of breath with exertion. Medications reviewed by the patient verbally.     HPI:  [name] is a 89 year old woman with  chronic atrial fibrillation, on warfarin COPD,   40 years of smoking, quit 20 years ago hyperlipidemia Chronic leg pain, weakness [facility] lives [facility] [place]. Chronic leg swelling who presents for routine followup of her atrial fibrillation.   Last seen in clinic September 2023 Lives at [facility] with a cane, gait instability Likes chair volleyball  In follow-up today reports she is doing well overall Rare episodes of SOB, "part of the COPD" "I am much slower", feels her ambulation is limited secondary to neuropathy For COPD, previously seen by pulmonary  on wixela ("has to take twice is much"), prefers advair Chronic shortness of breath  No open wounds on her legs, no leg edema Denies any chest pain concerning for angina No near-syncope or syncope  EKG personally reviewed by myself on todays visit EKG Interpretation Date/Time:  Tuesday September 02 2023 15:00:32 EST Ventricular Rate:  99 PR Interval:    QRS Duration:  82 QT Interval:  338 QTC Calculation: 433 R Axis:   268  Text Interpretation: Atrial fibrillation Right superior axis deviation Right ventricular hypertrophy Septal infarct , age undetermined No previous ECGs available Confirmed by [name] [phone]) on 09/02/2023 3:13:07 PM   Other past medical history reviewed chronic vertigo, balance issues, legs weak Previously seen by [name], [name] for vertigo  Prior cardiac notes from [name] in [place] indicate she has chronic atrial fibrillation,  echocardiogram in 2010 and March 2014 confirming atrial fibrillation. No old EKGs are available.   Echocardiogram from  March 2014 shows normal LV systolic function, severely dilated left and right atrium, mildly elevated right ventricular systolic pressure estimated at 35 mm of mercury  PMH:   has a past medical history of Atrial fibrillation (HCC), COPD (chronic obstructive pulmonary disease) (HCC), Hypothyroidism, Microscopic colitis, Osteoporosis, Squamous cell carcinoma, and Syncope.  PSH:    Past Surgical History:  Procedure Laterality Date   APPENDECTOMY     CATARACT EXTRACTION Bilateral    COLONOSCOPY     LIPOMA EXCISION     MULTIPLE TOOTH EXTRACTIONS     REFRACTIVE SURGERY     SQUAMOUS CELL CARCINOMA EXCISION  07/10/2017   left shin    Current Outpatient Medications  Medication Sig Dispense Refill   gabapentin (NEURONTIN) 100 MG capsule TAKE 2 CAPSULES(200 MG) BY MOUTH AT BEDTIME     ipratropium (ATROVENT) 0.03 % nasal spray PLACE 2 SPRAYS INTO BOTH NOSTRILS 3 (THREE) TIMES DAILY 90 mL 0   levothyroxine (SYNTHROID) 75 MCG tablet TAKE 1 TABLET BY MOUTH 30 MINS BEFORE EATING WITH A FULL GLASS OF WATER 90 tablet 2   verapamil (CALAN-SR) 120 MG CR tablet TAKE 1 TABLET BY MOUTH EVERY DAY 90 tablet 3   warfarin (COUMADIN) 5 MG tablet TAKE 1 TABLET BY MOUTH DAILY EXCEPT TAKE 1/2 TABLET ON SUNDAYS AND THURSDAYS OR AS DIRECTED BY ANTICOAGULATION CLINIC 95 tablet 1   WIXELA INHUB 250-50 MCG/ACT AEPB Inhale 1 puff into the lungs in the morning and at bedtime. 180 each 3   No current facility-administered medications for this visit.    Allergies:  Sulfa antibiotics   Social History:  The patient  reports that she quit smoking about 15 years ago. Her smoking use included cigarettes. She started smoking about 55 years ago. She has a 40 pack-year smoking history. She has never used smokeless tobacco. She reports that she does not currently use alcohol. She reports that she does not use drugs.   Family History:   family history includes Alzheimer's disease in her mother; Bladder Cancer in her brother; Brain  cancer in her father; Cancer in her father; Cancer (age of onset: 96) in her brother; Skin cancer in her brother.    Review of Systems: Review of Systems  HENT: Negative.    Respiratory: Negative.    Cardiovascular: Negative.   Gastrointestinal: Negative.   Musculoskeletal: Negative.        Poor balance, pain in her feet  Neurological: Negative.   Psychiatric/Behavioral: Negative.    All other systems reviewed and are negative.   PHYSICAL EXAM: VS:  BP 110/70 (BP Location: Left Arm, Patient Position: Sitting, Cuff Size: Normal)   Pulse 99   Ht 5\' 8"  (1.727 m)   Wt 132 lb 4 oz (60 kg)   SpO2 90%   BMI 20.11 kg/m  , BMI Body mass index is 20.11 kg/m. Constitutional:  oriented to person, place, and time. No distress.  HENT:  Head: Grossly normal Eyes:  no discharge. No scleral icterus.  Neck: No JVD, no carotid bruits  Cardiovascular: Regular rate and rhythm, no murmurs appreciated Pulmonary/Chest: Clear to auscultation bilaterally, no wheezes or rails Abdominal: Soft.  no distension.  no tenderness.  Musculoskeletal: Normal range of motion Neurological:  normal muscle tone. Coordination normal. No atrophy Skin: Skin warm and dry Psychiatric: normal affect, pleasant   Recent Labs: 12/19/2022: ALT 23; BUN 15; Creatinine, Ser 0.65; Potassium 4.0; Sodium 137; TSH 0.98    Lipid Panel Lab Results  Component Value Date   CHOL 216 (H) 12/19/2022   HDL 78.40 12/19/2022   LDLCALC 121 (H) 12/19/2022   TRIG 82.0 12/19/2022    Wt Readings from Last 3 Encounters:  09/02/23 132 lb 4 oz (60 kg)  12/26/22 134 lb (60.8 kg)  12/04/22 140 lb (63.5 kg)     ASSESSMENT AND PLAN:  Atrial fibrillation, unspecified type (HCC) - Plan: EKG 12-Lead Rate controlled, asymptomatic, Continue verapamil 120 daily for rate control  HLD (hyperlipidemia) No known CAD or PAD On previous discussions, prefers not to be on a statin  Centrilobular emphysema (HCC) Stable, chronic shortness of  breath Followed by [name] No recent exacerbation, prefers Advair over current inhaler  Leg pain/neuropathy Followed by podiatry and neurology  Foot drop. Hammertoes  On Neurontin 100 up to 200 mg Significant gait instability   Orders Placed This Encounter  Procedures   EKG 12-Lead     Signed, Dossie Arbour, M.D., Ph.D. 09/02/2023  [facility] [place], [place] [phone]

## 2023-09-02 ENCOUNTER — Encounter: Payer: Self-pay | Admitting: Cardiovascular Disease

## 2023-09-02 ENCOUNTER — Ambulatory Visit: Payer: PPO | Attending: Cardiovascular Disease | Admitting: Cardiovascular Disease

## 2023-09-02 VITALS — BP 110/70 | HR 99 | Ht 68.0 in | Wt 132.2 lb

## 2023-09-02 DIAGNOSIS — E782 Mixed hyperlipidemia: Secondary | ICD-10-CM | POA: Diagnosis not present

## 2023-09-02 DIAGNOSIS — J432 Centrilobular emphysema: Secondary | ICD-10-CM | POA: Diagnosis not present

## 2023-09-02 DIAGNOSIS — I482 Chronic atrial fibrillation, unspecified: Secondary | ICD-10-CM | POA: Diagnosis not present

## 2023-09-02 DIAGNOSIS — I872 Venous insufficiency (chronic) (peripheral): Secondary | ICD-10-CM

## 2023-09-02 NOTE — Patient Instructions (Signed)

## 2023-09-04 ENCOUNTER — Ambulatory Visit (INDEPENDENT_AMBULATORY_CARE_PROVIDER_SITE_OTHER): Payer: PPO

## 2023-09-04 DIAGNOSIS — I482 Chronic atrial fibrillation, unspecified: Secondary | ICD-10-CM | POA: Diagnosis not present

## 2023-09-04 DIAGNOSIS — Z7901 Long term (current) use of anticoagulants: Secondary | ICD-10-CM

## 2023-09-04 DIAGNOSIS — D802 Selective deficiency of immunoglobulin A [IgA]: Secondary | ICD-10-CM | POA: Diagnosis not present

## 2023-09-04 LAB — POCT INR: INR: 1.7 — AB (ref 2.0–3.0)

## 2023-09-04 NOTE — Progress Notes (Signed)
Received INR result of 1.7 from [facility] from yesterday via email. Email was not received with result until after hours last night. [place] [facility] has [facility] draw labs on Mondays and Thursdays. PTT was 17.3. Pt missed 1/2 tablet one week ago.  Increase dose today to take 1 tablet and then continue 1 tablet daily except take 1/2 tablet on Sundays and Thursdays. Recheck in 3 weeks, on 09/25/23. Advised pt on the phone. Pt verbalized understanding. Instructions also emailed to [place] and [name] at [facility].

## 2023-09-04 NOTE — Patient Instructions (Addendum)
Pre visit review using our clinic review tool, if applicable. No additional management support is needed unless otherwise documented below in the visit note.  Increase dose today to take 1 tablet and then continue 1 tablet daily except take 1/2 tablet on Sundays and Thursdays. Recheck in 3 weeks, on 09/25/23.

## 2023-09-25 ENCOUNTER — Telehealth: Payer: Self-pay

## 2023-09-25 NOTE — Telephone Encounter (Signed)
 Received email from pt's nurse at assisted living facility she resides. He reports he has been out sick all week and was not able to get the pt's INR lab order in for her lab draw today. He is requesting to move the lab draw to Monday, 1/13, because they only have lab draw on Monday and Thursday.  Advised ok to move lab draw to 1/13.

## 2023-09-29 ENCOUNTER — Ambulatory Visit (INDEPENDENT_AMBULATORY_CARE_PROVIDER_SITE_OTHER): Payer: Self-pay

## 2023-09-29 DIAGNOSIS — Z7901 Long term (current) use of anticoagulants: Secondary | ICD-10-CM | POA: Diagnosis not present

## 2023-09-29 DIAGNOSIS — I482 Chronic atrial fibrillation, unspecified: Secondary | ICD-10-CM | POA: Diagnosis not present

## 2023-09-29 DIAGNOSIS — D802 Selective deficiency of immunoglobulin A [IgA]: Secondary | ICD-10-CM | POA: Diagnosis not present

## 2023-09-29 LAB — POCT INR: INR: 2.5 (ref 2.0–3.0)

## 2023-09-29 NOTE — Patient Instructions (Addendum)
 Pre visit review using our clinic review tool, if applicable. No additional management support is needed unless otherwise documented below in the visit note.  Continue 1 tablet daily except take 1/2 tablet on Sundays and Thursdays. Recheck in 4 weeks, on 10/30/23.

## 2023-09-29 NOTE — Progress Notes (Signed)
 Received INR result of 2.5 from [facility] from today via email.  PTT was 25.7. Continue 1 tablet daily except take 1/2 tablet on Sundays and Thursdays. Recheck in 4 weeks, on 10/30/23. LVM with instructions. Instructions also emailed to Janci Minor and Darilyn Baseman at [facility].

## 2023-09-30 ENCOUNTER — Encounter: Payer: Self-pay | Admitting: Family Medicine

## 2023-10-07 ENCOUNTER — Telehealth: Payer: Self-pay | Admitting: Cardiovascular Disease

## 2023-10-07 MED ORDER — VERAPAMIL HCL ER 120 MG PO TBCR: EXTENDED_RELEASE_TABLET | ORAL | 3 refills | Status: DC

## 2023-10-07 NOTE — Telephone Encounter (Signed)
*  STAT* If patient is at the pharmacy, call can be transferred to refill team.   1. Which medications need to be refilled? (please list name of each medication and dose if known) verapamil (CALAN-SR) 120 MG CR tablet   2. Which pharmacy/location (including street and city if local pharmacy) is medication to be sent to?  [id number] [address], [place] [phone]    3. Do they need a 30 day or 90 day supply? 90

## 2023-10-16 DIAGNOSIS — D225 Melanocytic nevi of trunk: Secondary | ICD-10-CM | POA: Diagnosis not present

## 2023-10-16 DIAGNOSIS — D2262 Melanocytic nevi of left upper limb, including shoulder: Secondary | ICD-10-CM | POA: Diagnosis not present

## 2023-10-16 DIAGNOSIS — D2261 Melanocytic nevi of right upper limb, including shoulder: Secondary | ICD-10-CM | POA: Diagnosis not present

## 2023-10-16 DIAGNOSIS — L570 Actinic keratosis: Secondary | ICD-10-CM | POA: Diagnosis not present

## 2023-10-16 DIAGNOSIS — I872 Venous insufficiency (chronic) (peripheral): Secondary | ICD-10-CM | POA: Diagnosis not present

## 2023-10-16 DIAGNOSIS — Z85828 Personal history of other malignant neoplasm of skin: Secondary | ICD-10-CM | POA: Diagnosis not present

## 2023-10-30 ENCOUNTER — Ambulatory Visit (INDEPENDENT_AMBULATORY_CARE_PROVIDER_SITE_OTHER): Payer: Self-pay

## 2023-10-30 DIAGNOSIS — I482 Chronic atrial fibrillation, unspecified: Secondary | ICD-10-CM | POA: Diagnosis not present

## 2023-10-30 DIAGNOSIS — D802 Selective deficiency of immunoglobulin A [IgA]: Secondary | ICD-10-CM | POA: Diagnosis not present

## 2023-10-30 DIAGNOSIS — Z7901 Long term (current) use of anticoagulants: Secondary | ICD-10-CM | POA: Diagnosis not present

## 2023-10-30 LAB — POCT INR: INR: 1.9 — AB (ref 2.0–3.0)

## 2023-10-30 NOTE — Patient Instructions (Addendum)
Pre visit review using our clinic review tool, if applicable. No additional management support is needed unless otherwise documented below in the visit note.  Increase dose today to take 1 tablet and then continue 1 tablet daily except take 1/2 tablet on Sundays and Thursdays. Recheck in 3 weeks, on 11/20/23.

## 2023-10-30 NOTE — Progress Notes (Signed)
Received INR result of 1.9 from [facility] from today via email.  PTT was 19.6. Increase dose today to take 1 tablet and then continue 1 tablet daily except take 1/2 tablet on Sundays and Thursdays. Recheck in 3 weeks, on 11/20/23. LVM with instructions. Instructions also emailed to [place] and [name] at [facility].

## 2023-11-06 ENCOUNTER — Other Ambulatory Visit: Payer: Self-pay | Admitting: Family Medicine

## 2023-11-13 DIAGNOSIS — D485 Neoplasm of uncertain behavior of skin: Secondary | ICD-10-CM | POA: Diagnosis not present

## 2023-11-13 DIAGNOSIS — C44622 Squamous cell carcinoma of skin of right upper limb, including shoulder: Secondary | ICD-10-CM | POA: Diagnosis not present

## 2023-11-20 ENCOUNTER — Ambulatory Visit (INDEPENDENT_AMBULATORY_CARE_PROVIDER_SITE_OTHER): Payer: Self-pay

## 2023-11-20 DIAGNOSIS — Z7901 Long term (current) use of anticoagulants: Secondary | ICD-10-CM

## 2023-11-20 DIAGNOSIS — I482 Chronic atrial fibrillation, unspecified: Secondary | ICD-10-CM | POA: Diagnosis not present

## 2023-11-20 DIAGNOSIS — D802 Selective deficiency of immunoglobulin A [IgA]: Secondary | ICD-10-CM | POA: Diagnosis not present

## 2023-11-20 LAB — POCT INR: INR: 1.5 — AB (ref 2.0–3.0)

## 2023-11-20 NOTE — Patient Instructions (Addendum)
 Pre visit review using our clinic review tool, if applicable. No additional management support is needed unless otherwise documented below in the visit note.  Increase dose today to take 1 tablet and  increase dose tomorrow to take 1 1/2 tablets and then change weekly dose to take 1 tablet daily except take 1/2 tablet on Sundays. Recheck in 3 weeks, on 12/11/23.

## 2023-11-20 NOTE — Progress Notes (Addendum)
 Received INR result of 1.5 from [facility] from today via email.  PTT was 16.0. Pt denies any changes except she has an increase in protein intake with using Ensure drinks. Increases in protein can decrease INR due to binding with albumin and reducing free warfarin in the blood stream.  Increase dose today to take 1 tablet and  increase dose tomorrow to take 1 1/2 tablets and then change weekly dose to take 1 tablet daily except take 1/2 tablet on Sundays. Recheck in 3 weeks, on 12/11/23, per pt request due to cost. Contacted pt and advised of dosing and recheck date. Instructions also emailed to [place] and [name] at [facility].

## 2023-12-02 ENCOUNTER — Telehealth: Payer: Self-pay

## 2023-12-02 NOTE — Telephone Encounter (Signed)
 Pt has INR lab draw at her facility at [facility] [place], by [facility] coming in twice weekly to provide the service. She LVM reporting the cost of the lab has risen to $40 and she is wondering if this nurse can help her figure out why it has risen. She reported she also talked to [facility] and her insurance and cannot seem to get any help.  LVM for pt to return call if help is still needed. Unable to change any pricing from [facility] but may be able to advise pt on further steps.

## 2023-12-05 ENCOUNTER — Telehealth: Payer: Self-pay | Admitting: *Deleted

## 2023-12-05 DIAGNOSIS — E782 Mixed hyperlipidemia: Secondary | ICD-10-CM

## 2023-12-05 DIAGNOSIS — E039 Hypothyroidism, unspecified: Secondary | ICD-10-CM

## 2023-12-05 NOTE — Telephone Encounter (Signed)
-----   Message from Alvina Chou sent at 12/05/2023 10:06 AM EDT ----- Regarding: Lab orders for Tue, 4.8.25 Patient is scheduled for CPX labs, please order future labs, Thanks , Camelia Eng

## 2023-12-06 ENCOUNTER — Other Ambulatory Visit: Payer: Self-pay | Admitting: Family Medicine

## 2023-12-09 ENCOUNTER — Ambulatory Visit (INDEPENDENT_AMBULATORY_CARE_PROVIDER_SITE_OTHER): Payer: PPO

## 2023-12-09 VITALS — Ht 68.0 in | Wt 132.0 lb

## 2023-12-09 DIAGNOSIS — Z0000 Encounter for general adult medical examination without abnormal findings: Secondary | ICD-10-CM

## 2023-12-09 NOTE — Patient Instructions (Signed)
 [name] , Thank you for taking time to come for your Medicare Wellness Visit. I appreciate your ongoing commitment to your health goals. Please review the following plan we discussed and let me know if I can assist you in the future.   Referrals/Orders/Follow-Ups/Clinician Recommendations: none  This is a list of the screening recommended for you and due dates:  Health Maintenance  Topic Date Due   DTaP/Tdap/Td vaccine (2 - Tdap) 04/30/2014   DEXA scan (bone density measurement)  06/02/2017   COVID-19 Vaccine (7 - Moderna risk 2024-25 season) 04/06/2024   Medicare Annual Wellness Visit  12/08/2024   Pneumonia Vaccine  Completed   Flu Shot  Completed   Zoster (Shingles) Vaccine  Completed   HPV Vaccine  Aged Out    Advanced directives: (In Chart) A copy of your advanced directives are scanned into your chart should your provider ever need it.  Next Medicare Annual Wellness Visit scheduled for next year: Yes 12/09/24 @ 11:30am

## 2023-12-09 NOTE — Progress Notes (Signed)
 Subjective:   [name] is a 89 y.o. who presents for a Medicare Wellness preventive visit.  Visit Complete: Virtual I connected with  [name] on 12/09/23 by a audio enabled telemedicine application and verified that I am speaking with the correct person using two identifiers.  Patient Location: Home  Provider Location: Home Office  I discussed the limitations of evaluation and management by telemedicine. The patient expressed understanding and agreed to proceed.  Vital Signs: Because this visit was a virtual/telehealth visit, some criteria may be missing or patient reported. Any vitals not documented were not able to be obtained and vitals that have been documented are patient reported.  VideoDeclined- This patient declined Librarian, academic. Therefore the visit was completed with audio only.  Persons Participating in Visit: Patient.  AWV Questionnaire: No: Patient Medicare AWV questionnaire was not completed prior to this visit.  Cardiac Risk Factors include: advanced age (>73men, >6 women);dyslipidemia     Objective:    Today's Vitals   12/09/23 1136  Weight: 132 lb (59.9 kg)  Height: 5\' 8"  (1.727 m)   Body mass index is 20.07 kg/m.     12/09/2023   11:57 AM 12/04/2022    1:50 PM 09/02/2022    1:57 PM 11/15/2021    2:50 PM 11/14/2020    2:59 PM 11/02/2019    8:58 AM 10/27/2018   11:15 AM  Advanced Directives  Does Patient Have a Medical Advance Directive? Yes Yes Yes Yes Yes Yes Yes  Type of Estate agent of [place];Living will Healthcare Power of [place];Living will Living will;Out of facility DNR (pink MOST or yellow form) Healthcare Power of [place];Living will Healthcare Power of [place];Living will Healthcare Power of [place];Living will Healthcare Power of [place];Living will  Does patient want to make changes to medical advance directive?  No - Patient declined No - Patient declined Yes  (MAU/Ambulatory/Procedural Areas - Information given)     Copy of Healthcare Power of Attorney in Chart? Yes - validated most recent copy scanned in chart (See row information) Yes - validated most recent copy scanned in chart (See row information)  Yes - validated most recent copy scanned in chart (See row information) Yes - validated most recent copy scanned in chart (See row information) No - copy requested No - copy requested    Current Medications (verified) Outpatient Encounter Medications as of 12/09/2023  Medication Sig   fluticasone-salmeterol (WIXELA INHUB) 250-50 MCG/ACT AEPB INHALE 1 PUFF INTO THE LUNGS IN THE MORNING AND AT BEDTIME.   gabapentin (NEURONTIN) 100 MG capsule TAKE 2 CAPSULES(200 MG) BY MOUTH AT BEDTIME   ipratropium (ATROVENT) 0.03 % nasal spray PLACE 2 SPRAYS INTO BOTH NOSTRILS 3 (THREE) TIMES DAILY   levothyroxine (SYNTHROID) 75 MCG tablet TAKE 1 TABLET BY MOUTH 30 MINS BEFORE EATING WITH A FULL GLASS OF WATER   verapamil (CALAN-SR) 120 MG CR tablet TAKE 1 TABLET BY MOUTH EVERY DAY   warfarin (COUMADIN) 5 MG tablet TAKE 1 TABLET BY MOUTH DAILY EXCEPT TAKE 1/2 TABLET ON SUNDAYS AND THURSDAYS OR AS DIRECTED BY ANTICOAGULATION CLINIC   No facility-administered encounter medications on file as of 12/09/2023.    Allergies (verified) Sulfa antibiotics   History: Past Medical History:  Diagnosis Date   Atrial fibrillation (HCC)    COPD (chronic obstructive pulmonary disease) (HCC)    Hypothyroidism    Microscopic colitis    Osteoporosis    Squamous cell carcinoma    Syncope  Past Surgical History:  Procedure Laterality Date   APPENDECTOMY     CATARACT EXTRACTION Bilateral    COLONOSCOPY     LIPOMA EXCISION     MULTIPLE TOOTH EXTRACTIONS     REFRACTIVE SURGERY     SQUAMOUS CELL CARCINOMA EXCISION  07/10/2017   left shin   Family History  Problem Relation Age of Onset   Alzheimer's disease Mother    Brain cancer Father    Cancer Father    Cancer  Brother 77       bladder   Bladder Cancer Brother    Skin cancer Brother    Social History   Socioeconomic History   Marital status: Widowed    Spouse name: Not on file   Number of children: Not on file   Years of education: Not on file   Highest education level: Not on file  Occupational History   Not on file  Tobacco Use   Smoking status: Former    Current packs/day: 0.00    Average packs/day: 1 pack/day for 40.0 years (40.0 ttl pk-yrs)    Types: Cigarettes    Start date: 66    Quit date: 2009    Years since quitting: 16.2   Smokeless tobacco: Never  Vaping Use   Vaping status: Never Used  Substance and Sexual Activity   Alcohol use: Not Currently    Comment: social   Drug use: No   Sexual activity: Never  Other Topics Concern   Not on file  Social History Narrative   Recently moved to [place] [facility] 03/2013 from [place].   Husband died in 31-Oct-2011.      Son (adopted) lives in [place], [place].  Two grandchildren- 25, 8 yo.   DNR- forms signed and returned to patient.            Social Drivers of Corporate investment banker Strain: Low Risk  (12/09/2023)   Overall Financial Resource Strain (CARDIA)    Difficulty of Paying Living Expenses: Not hard at all  Food Insecurity: No Food Insecurity (12/09/2023)   Hunger Vital Sign    Worried About Running Out of Food in the Last Year: Never true    Ran Out of Food in the Last Year: Never true  Transportation Needs: No Transportation Needs (12/09/2023)   PRAPARE - Administrator, Civil Service (Medical): No    Lack of Transportation (Non-Medical): No  Physical Activity: Inactive (12/09/2023)   Exercise Vital Sign    Days of Exercise per Week: 0 days    Minutes of Exercise per Session: 0 min  Stress: No Stress Concern Present (12/09/2023)   [employer] of Occupational Health - Occupational Stress Questionnaire    Feeling of Stress : Only a little  Social Connections: Moderately Integrated (12/09/2023)    Social Connection and Isolation Panel [NHANES]    Frequency of Communication with Friends and Family: More than three times a week    Frequency of Social Gatherings with Friends and Family: More than three times a week    Attends Religious Services: More than 4 times per year    Active Member of Clubs or Organizations: No    Attends Banker Meetings: 1 to 4 times per year    Marital Status: Widowed    Tobacco Counseling Counseling given: Not Answered    Clinical Intake:  Pre-visit preparation completed: Yes  Pain : No/denies pain     BMI - recorded: 20.07 Nutritional Status: BMI of  19-24  Normal Nutritional Risks: None Diabetes: No  Lab Results  Component Value Date   HGBA1C 6.0 09/19/2014     How often do you need to have someone help you when you read instructions, pamphlets, or other written materials from your doctor or pharmacy?: 1 - Never  Interpreter Needed?: No  Comments: lives at [facility] Information entered by :: B.Hawke Villalpando,LPN   Activities of Daily Living     12/09/2023   11:57 AM  In your present state of health, do you have any difficulty performing the following activities:  Hearing? 1  Vision? 1  Difficulty concentrating or making decisions? 1  Walking or climbing stairs? 0  Dressing or bathing? 0  Doing errands, shopping? 1  Comment does not drive  Preparing Food and eating ? N  Using the Toilet? N  In the past six months, have you accidently leaked urine? N  Do you have problems with loss of bowel control? N  Managing your Medications? N  Managing your Finances? N  Housekeeping or managing your Housekeeping? N    Patient Care Team: Excell Seltzer, MD as PCP - General (Family Medicine) Debbrah Alar, MD (Dermatology) Bud Face, MD as Referring Physician (Otolaryngology) Sallee Lange, MD as Consulting Physician (Ophthalmology) Antonieta Iba, MD as Consulting Physician (Cardiology) Elnita Maxwell, MD as Referring Physician (Gastroenterology) Debbrah Alar, MD (Dermatology)  Indicate any recent Medical Services you may have received from other than [facility] providers in the past year (date may be approximate).     Assessment:   This is a routine wellness examination for [name].  Hearing/Vision screen Hearing Screening - Comments:: Pt says her hearing is not good: got hearing aids from [employer] but does not use much Vision Screening - Comments:: Pt says her vision is not good Dr Dellie Burns   Goals Addressed               This Visit's Progress     Patient Stated   On track     12/09/23-Would like to stop eating sweets      Patient Stated (pt-stated)        I would like to keep active, walking and moving       Depression Screen     12/09/2023   11:52 AM 12/04/2022    1:49 PM 11/15/2021    2:56 PM 11/14/2020    3:01 PM 11/02/2019    9:00 AM 10/27/2018   11:03 AM 10/21/2017    2:11 PM  PHQ 2/9 Scores  PHQ - 2 Score 0 0 0 0 2 0 0  PHQ- 9 Score    0 2 0 0    Fall Risk     12/09/2023   11:45 AM 12/04/2022    1:51 PM 01/24/2022    8:55 AM 11/15/2021    2:53 PM 11/14/2020    3:01 PM  Fall Risk   Falls in the past year? 1 1 0 0 0  Number falls in past yr: 0 0  0 0  Injury with Fall? 1 1  0 0  Comment  right leg goes to wound clinic     Risk for fall due to : Impaired balance/gait;Impaired vision;Impaired mobility No Fall Risks  Impaired balance/gait Impaired balance/gait  Follow up Education provided;Falls prevention discussed Falls prevention discussed;Falls evaluation completed Falls evaluation completed Falls prevention discussed Falls evaluation completed;Falls prevention discussed    MEDICARE RISK AT HOME:  Medicare Risk at Home Any  stairs in or around the home?: No If so, are there any without handrails?: No Home free of loose throw rugs in walkways, pet beds, electrical cords, etc?: Yes Adequate lighting in your home to reduce risk of falls?: Yes Life  alert?: Yes Use of a cane, walker or w/c?: Yes (cane and walker) Grab bars in the bathroom?: Yes (by commode and shower) Shower chair or bench in shower?: No Elevated toilet seat or a handicapped toilet?: Yes  TIMED UP AND GO:  Was the test performed?  No  Cognitive Function: 6CIT completed    11/14/2020    3:07 PM 11/02/2019    9:06 AM 10/27/2018   11:03 AM 10/21/2017    2:15 PM 10/07/2016    1:29 PM  MMSE - Mini Mental State Exam  Orientation to time 5 5 5 5 5   Orientation to Place 5 5 5 5 5   Registration 3 3 3 3 3   Attention/ Calculation 5 5 0 0 0  Recall 3 3 3 3 3   Language- name 2 objects   0 0 0  Language- repeat 1 1 1 1 1   Language- follow 3 step command   3 3 3   Language- read & follow direction   0 0 0  Write a sentence   0 0 0  Copy design   0 0 0  Total score   20 20 20         12/09/2023   12:02 PM 12/04/2022    1:53 PM 11/15/2021    3:10 PM  6CIT Screen  What Year? 0 points 0 points 0 points  What month? 0 points 0 points 0 points  What time? 0 points 0 points 0 points  Count back from 20 0 points 0 points 0 points  Months in reverse 0 points 0 points 0 points  Repeat phrase 0 points 0 points 2 points  Total Score 0 points 0 points 2 points    Immunizations Immunization History  Administered Date(s) Administered   DTaP 04/30/2004   Influenza, High Dose Seasonal PF 06/17/2014   Influenza,inj,Quad PF,6+ Mos 06/22/2013   Influenza-Unspecified 06/26/2015, 06/25/2017, 06/24/2019, 06/16/2020, 06/21/2021, 07/02/2022, 07/18/2023   Moderna Covid-19 Vaccine Bivalent Booster 31yrs & up 02/12/2022   Moderna Sars-Covid-2 Vaccination 10/07/2019, 10/28/2019, 07/28/2020   Pfizer Covid-19 Vaccine Bivalent Booster 81yrs & up 06/07/2021   Pfizer(Comirnaty)Fall Seasonal Vaccine 12 years and older 10/08/2023   Pneumococcal Conjugate-13 09/29/2014   Pneumococcal Polysaccharide-23 06/01/2012   Respiratory Syncytial Virus Vaccine,Recomb Aduvanted(Arexvy) 09/30/2022   Varicella  08/06/2007   Zoster Recombinant(Shingrix) 01/17/2022, 03/18/2022    Screening Tests Health Maintenance  Topic Date Due   DTaP/Tdap/Td (2 - Tdap) 04/30/2014   DEXA SCAN  06/02/2017   COVID-19 Vaccine (7 - Moderna risk 2024-25 season) 04/06/2024   Medicare Annual Wellness (AWV)  12/08/2024   Pneumonia Vaccine 83+ Years old  Completed   INFLUENZA VACCINE  Completed   Zoster Vaccines- Shingrix  Completed   HPV VACCINES  Aged Out    Health Maintenance  Health Maintenance Due  Topic Date Due   DTaP/Tdap/Td (2 - Tdap) 04/30/2014   DEXA SCAN  06/02/2017   Health Maintenance Items Addressed: None needed  Additional Screening:  Vision Screening: Recommended annual ophthalmology exams for early detection of glaucoma and other disorders of the eye.  Dental Screening: Recommended annual dental exams for proper oral hygiene  Community Resource Referral / Chronic Care Management: CRR required this visit?  No   CCM required this visit?  No     Plan:     I have personally reviewed and noted the following in the patient's chart:   Medical and social history Use of alcohol, tobacco or illicit drugs  Current medications and supplements including opioid prescriptions. Patient is not currently taking opioid prescriptions. Functional ability and status Nutritional status Physical activity Advanced directives List of other physicians Hospitalizations, surgeries, and ER visits in previous 12 months Vitals Screenings to include cognitive, depression, and falls Referrals and appointments  In addition, I have reviewed and discussed with patient certain preventive protocols, quality metrics, and best practice recommendations. A written personalized care plan for preventive services as well as general preventive health recommendations were provided to patient.     Sue Lush, LPN   1/61/0960   After Visit Summary: (MyChart) Due to this being a telephonic visit, the after  visit summary with patients personalized plan was offered to patient via MyChart   Notes: Nothing significant to report at this time.

## 2023-12-11 ENCOUNTER — Ambulatory Visit (INDEPENDENT_AMBULATORY_CARE_PROVIDER_SITE_OTHER): Payer: Self-pay

## 2023-12-11 DIAGNOSIS — D802 Selective deficiency of immunoglobulin A [IgA]: Secondary | ICD-10-CM | POA: Diagnosis not present

## 2023-12-11 DIAGNOSIS — Z7901 Long term (current) use of anticoagulants: Secondary | ICD-10-CM | POA: Diagnosis not present

## 2023-12-11 DIAGNOSIS — I482 Chronic atrial fibrillation, unspecified: Secondary | ICD-10-CM | POA: Diagnosis not present

## 2023-12-11 LAB — POCT INR: INR: 3 (ref 2.0–3.0)

## 2023-12-11 NOTE — Patient Instructions (Addendum)
 Pre visit review using our clinic review tool, if applicable. No additional management support is needed unless otherwise documented below in the visit note.  Continue 1 tablet daily except take 1/2 tablet on Sundays. Recheck in 4 weeks, on 4/24.

## 2023-12-11 NOTE — Progress Notes (Addendum)
 Received INR result of 3.0 from [facility] from today via email.  PTT was 29.5. Continue 1 tablet daily except take 1/2 tablet on Sundays. Recheck in 4 weeks, on 4/24. LVM for pt and advised of dosing and recheck date. Instructions also emailed to [place] [employer] and [name] at [facility].  Pt returned call and LVM that she does not currently have a phone and would like results and dosing instructions emailed to [email or web address] Emailed results and instructions.

## 2023-12-12 ENCOUNTER — Other Ambulatory Visit: Payer: Self-pay | Admitting: Family Medicine

## 2023-12-12 NOTE — Telephone Encounter (Signed)
 Last office visit 12/26/2022 for CPE.  Last refilled?   Next Appt: CPE 12/30/2023.

## 2023-12-15 ENCOUNTER — Encounter: Payer: Self-pay | Admitting: Family Medicine

## 2023-12-23 ENCOUNTER — Other Ambulatory Visit (INDEPENDENT_AMBULATORY_CARE_PROVIDER_SITE_OTHER): Payer: PPO

## 2023-12-23 ENCOUNTER — Encounter: Payer: Self-pay | Admitting: Family Medicine

## 2023-12-23 DIAGNOSIS — E039 Hypothyroidism, unspecified: Secondary | ICD-10-CM

## 2023-12-23 DIAGNOSIS — E782 Mixed hyperlipidemia: Secondary | ICD-10-CM

## 2023-12-23 LAB — LIPID PANEL
Cholesterol: 210 mg/dL — ABNORMAL HIGH (ref 0–200)
HDL: 78.6 mg/dL (ref >39.00)
LDL Cholesterol: 109 mg/dL — ABNORMAL HIGH (ref 0–99)
NonHDL: 131.83
Total CHOL/HDL Ratio: 3
Triglycerides: 114 mg/dL (ref 0.0–149.0)
VLDL: 22.8 mg/dL (ref 0.0–40.0)

## 2023-12-23 LAB — COMPREHENSIVE METABOLIC PANEL WITH GFR
ALT: 23 U/L (ref 0–35)
AST: 27 U/L (ref 0–37)
Albumin: 4.4 g/dL (ref 3.5–5.2)
Alkaline Phosphatase: 87 U/L (ref 39–117)
BUN: 16 mg/dL (ref 6–23)
CO2: 27 meq/L (ref 19–32)
Calcium: 10.2 mg/dL (ref 8.4–10.5)
Chloride: 100 meq/L (ref 96–112)
Creatinine, Ser: 0.68 mg/dL (ref 0.40–1.20)
GFR: 76.9 mL/min (ref >60.00)
Glucose, Bld: 94 mg/dL (ref 70–99)
Potassium: 3.9 meq/L (ref 3.5–5.1)
Sodium: 136 meq/L (ref 135–145)
Total Bilirubin: 0.8 mg/dL (ref 0.2–1.2)
Total Protein: 7.1 g/dL (ref 6.0–8.3)

## 2023-12-23 LAB — TSH: TSH: 2.04 u[IU]/mL (ref 0.35–5.50)

## 2023-12-23 LAB — T3, FREE: T3, Free: 3.2 pg/mL (ref 2.3–4.2)

## 2023-12-23 LAB — T4, FREE: Free T4: 1.21 ng/dL (ref 0.60–1.60)

## 2023-12-23 NOTE — Progress Notes (Signed)
 No critical labs need to be addressed urgently. We will discuss labs in detail at upcoming office visit.

## 2023-12-30 ENCOUNTER — Encounter: Payer: PPO | Admitting: Family Medicine

## 2024-01-06 ENCOUNTER — Ambulatory Visit: Admitting: Family Medicine

## 2024-01-06 ENCOUNTER — Encounter: Payer: Self-pay | Admitting: Family Medicine

## 2024-01-06 VITALS — BP 112/76 | HR 82 | Temp 98.0°F | Ht 67.75 in | Wt 128.2 lb

## 2024-01-06 DIAGNOSIS — M8580 Other specified disorders of bone density and structure, unspecified site: Secondary | ICD-10-CM | POA: Diagnosis not present

## 2024-01-06 DIAGNOSIS — L97921 Non-pressure chronic ulcer of unspecified part of left lower leg limited to breakdown of skin: Secondary | ICD-10-CM

## 2024-01-06 DIAGNOSIS — E039 Hypothyroidism, unspecified: Secondary | ICD-10-CM

## 2024-01-06 DIAGNOSIS — I4891 Unspecified atrial fibrillation: Secondary | ICD-10-CM

## 2024-01-06 DIAGNOSIS — J432 Centrilobular emphysema: Secondary | ICD-10-CM | POA: Diagnosis not present

## 2024-01-06 DIAGNOSIS — E782 Mixed hyperlipidemia: Secondary | ICD-10-CM

## 2024-01-06 DIAGNOSIS — Z0000 Encounter for general adult medical examination without abnormal findings: Secondary | ICD-10-CM

## 2024-01-06 DIAGNOSIS — J4489 Other specified chronic obstructive pulmonary disease: Secondary | ICD-10-CM

## 2024-01-06 NOTE — Patient Instructions (Addendum)
 Can start ALA  ([phone] mg of alpha-lipoic acid) omega three fatty acid to help with neuropathy pain.

## 2024-01-06 NOTE — Progress Notes (Signed)
 Patient ID: [name], female    DOB: 03/19/1932, 89 y.o.   MRN: [id number]  This visit was conducted in person.  BP 112/76 (BP Location: Right Arm, Patient Position: Sitting, Cuff Size: Normal)   Pulse 82   Temp 98 F (36.7 C) (Temporal)   Ht 5' 7.75" (1.721 m)   Wt 128 lb 4 oz (58.2 kg)   SpO2 96%   BMI 19.64 kg/m    CC: Chief Complaint  Patient presents with   Annual Exam    Part 2 (M[facility] 12/09/2023)    Subjective:   HPI: [name] is a 89 y.o. female presenting on 01/06/2024 for Annual Exam (Part 2 (MWV 12/09/2023))  The patient presents for  complete physical and review of chronic health problems. He/She also has the following acute concerns today: none  The patient saw a LPN or RN for medicare wellness visit. 12/04/22  Prevention and wellness was reviewed in detail. Note reviewed and important notes copied below.  MOST form renewed and reviewed with patient in detail  COPD/emphysema: Controlled with Wixella  BID.  Followed by pulmonary Dr. Auston Left  Last office visit note reviewed  Hypothyroidism stable control on levothyroxine  75 mcg daily Lab Results  Component Value Date   TSH 2.04 12/23/2023   S/P Squamous cell ca on right  dorsal hand  Atrial fibrillation: Followed by Dr. Gollan.  Last office visit note reviewed.  On Coumadin  for anticoagulation  Chronic leg pain and neuropathy, Hammertoe, foot drop : Followed by neurology and podiatry.  On Neurontin  with tolerable control, no pain in daytime.  History of venous insufficiency  Nml ABIs in 09/2022  Has history of recurrent ulcers on legs... Followed  by wound nurse at [facility].  Has new injury on left lower leg 1 month ago... hit leg with other foot.  Clear discharge, no odor,  Washes it with antibacterial soap. Wearing the compression hose every day.    Elevated Cholesterol: LDL not at goal but given patient age she is not on a statin medication to avoid side effects. Lab Results  Component  Value Date   CHOL 210 (H) 12/23/2023   HDL 78.60 12/23/2023   LDLCALC 109 (H) 12/23/2023   LDLDIRECT 126.5 07/08/2013   TRIG 114.0 12/23/2023   CHOLHDL 3 12/23/2023  Using medications without problems: Muscle aches:  Diet compliance: Heart healthy. Exercise:  walking  every other day... using a cane Other complaints: Wt Readings from Last 3 Encounters:  01/06/24 128 lb 4 oz (58.2 kg)  12/09/23 132 lb (59.9 kg)  09/02/23 132 lb 4 oz (60 kg)  Body mass index is 19.64 kg/m.   Relevant past medical, surgical, family and social history reviewed and updated as indicated. Interim medical history since our last visit reviewed. Allergies and medications reviewed and updated. Outpatient Medications Prior to Visit  Medication Sig Dispense Refill   fluticasone -salmeterol (WIXELA INHUB ) 250-50 MCG/ACT AEPB INHALE 1 PUFF INTO THE LUNGS IN THE MORNING AND AT BEDTIME. 180 each 0   gabapentin  (NEURONTIN ) 100 MG capsule Take 2 capsules (200 mg total) by mouth at bedtime. 180 capsule 1   ipratropium (ATROVENT ) 0.03 % nasal spray PLACE 2 SPRAYS INTO BOTH NOSTRILS 3 (THREE) TIMES DAILY 90 mL 0   levothyroxine  (SYNTHROID ) 75 MCG tablet TAKE 1 TABLET BY MOUTH 30 MINS BEFORE EATING WITH A FULL GLASS OF WATER 90 tablet 0   verapamil  (CALAN -SR) 120 MG CR tablet TAKE 1 TABLET BY MOUTH EVERY DAY  90 tablet 3   warfarin (COUMADIN ) 5 MG tablet TAKE 1 TABLET BY MOUTH DAILY EXCEPT TAKE 1/2 TABLET ON SUNDAYS AND THURSDAYS OR AS DIRECTED BY ANTICOAGULATION CLINIC 95 tablet 1   No facility-administered medications prior to visit.     Per HPI unless specifically indicated in ROS section below Review of Systems  Constitutional:  Negative for fatigue and fever.  HENT:  Negative for congestion.   Eyes:  Negative for pain.  Respiratory:  Negative for cough and shortness of breath.   Cardiovascular:  Negative for chest pain, palpitations and leg swelling.  Gastrointestinal:  Negative for abdominal pain.   Genitourinary:  Negative for dysuria and vaginal bleeding.  Musculoskeletal:  Negative for back pain.  Neurological:  Negative for syncope, light-headedness and headaches.  Psychiatric/Behavioral:  Negative for dysphoric mood.    Objective:  BP 112/76 (BP Location: Right Arm, Patient Position: Sitting, Cuff Size: Normal)   Pulse 82   Temp 98 F (36.7 C) (Temporal)   Ht 5' 7.75" (1.721 m)   Wt 128 lb 4 oz (58.2 kg)   SpO2 96%   BMI 19.64 kg/m   Wt Readings from Last 3 Encounters:  01/06/24 128 lb 4 oz (58.2 kg)  12/09/23 132 lb (59.9 kg)  09/02/23 132 lb 4 oz (60 kg)      Physical Exam Constitutional:      General: She is not in acute distress.    Appearance: Normal appearance. She is well-developed. She is not ill-appearing or toxic-appearing.  HENT:     Head: Normocephalic.     Right Ear: Hearing, tympanic membrane, ear canal and external ear normal. Tympanic membrane is not erythematous, retracted or bulging.     Left Ear: Hearing, tympanic membrane, ear canal and external ear normal. Tympanic membrane is not erythematous, retracted or bulging.     Nose: No mucosal edema or rhinorrhea.     Right Sinus: No maxillary sinus tenderness or frontal sinus tenderness.     Left Sinus: No maxillary sinus tenderness or frontal sinus tenderness.     Mouth/Throat:     Pharynx: Uvula midline.  Eyes:     General: Lids are normal. Lids are everted, no foreign bodies appreciated.     Conjunctiva/sclera: Conjunctivae normal.     Pupils: Pupils are equal, round, and reactive to light.  Neck:     Thyroid : No thyroid  mass or thyromegaly.     Vascular: No carotid bruit.     Trachea: Trachea normal.  Cardiovascular:     Rate and Rhythm: Normal rate and regular rhythm.     Pulses: Normal pulses.     Heart sounds: Normal heart sounds, S1 normal and S2 normal. No murmur heard.    No friction rub. No gallop.  Pulmonary:     Effort: Pulmonary effort is normal. No tachypnea or respiratory  distress.     Breath sounds: Normal breath sounds. No decreased breath sounds, wheezing, rhonchi or rales.  Abdominal:     General: Bowel sounds are normal.     Palpations: Abdomen is soft.     Tenderness: There is no abdominal tenderness.  Musculoskeletal:     Cervical back: Normal range of motion and neck supple.  Skin:    General: Skin is warm and dry.     Findings: Lesion present. No rash.          Comments: Ulcer, shallow based, no discharge, no odor no surrounding  erythema.  Neurological:     Mental Status:  She is alert.  Psychiatric:        Mood and Affect: Mood is not anxious or depressed.        Speech: Speech normal.        Behavior: Behavior normal. Behavior is cooperative.        Thought Content: Thought content normal.        Judgment: Judgment normal.       Results for orders placed or performed in visit on 12/23/23  TSH   Collection Time: 12/23/23  8:52 AM  Result Value Ref Range   TSH 2.04 0.35 - 5.50 uIU/mL  T4, free   Collection Time: 12/23/23  8:52 AM  Result Value Ref Range   Free T4 1.21 0.60 - 1.60 ng/dL  T3, free   Collection Time: 12/23/23  8:52 AM  Result Value Ref Range   T3, Free 3.2 2.3 - 4.2 pg/mL  Comprehensive metabolic panel   Collection Time: 12/23/23  8:52 AM  Result Value Ref Range   Sodium 136 135 - 145 mEq/L   Potassium 3.9 3.5 - 5.1 mEq/L   Chloride 100 96 - 112 mEq/L   CO2 27 19 - 32 mEq/L   Glucose, Bld 94 70 - 99 mg/dL   BUN 16 6 - 23 mg/dL   Creatinine, Ser 1.61 0.40 - 1.20 mg/dL   Total Bilirubin 0.8 0.2 - 1.2 mg/dL   Alkaline Phosphatase 87 39 - 117 U/L   AST 27 0 - 37 U/L   ALT 23 0 - 35 U/L   Total Protein 7.1 6.0 - 8.3 g/dL   Albumin 4.4 3.5 - 5.2 g/dL   GFR 09.60 >45.40 mL/min   Calcium 10.2 8.4 - 10.5 mg/dL  Lipid panel   Collection Time: 12/23/23  8:52 AM  Result Value Ref Range   Cholesterol 210 (H) 0 - 200 mg/dL   Triglycerides 981.1 0.0 - 149.0 mg/dL   HDL 91.47 >82.95 mg/dL   VLDL 62.1 0.0 - 30.8  mg/dL   LDL Cholesterol 657 (H) 0 - 99 mg/dL   Total CHOL/HDL Ratio 3    NonHDL 131.83     This visit occurred during the SARS-CoV-2 public health emergency.  Safety protocols were in place, including screening questions prior to the visit, additional usage of staff PPE, and extensive cleaning of exam room while observing appropriate contact time as indicated for disinfecting solutions.   COVID 19 screen:  No recent travel or known exposure to COVID19 The patient denies respiratory symptoms of COVID 19 at this time. The importance of social distancing was discussed today.   Assessment and Plan The patient's preventative maintenance and recommended screening tests for an annual wellness exam were reviewed in full today. Brought up to date unless services declined.  Counselled on the importance of diet, exercise, and its role in overall health and mortality. The patient's FH and SH was reviewed, including their home life, tobacco status, and drug and alcohol status.   Vaccines:up-to-date with COVID ( recent booster 4/-2024), shingrix and pneumonia vaccine.  Next flu vaccine due in the fall.  Postpone tetanus given not covered by Medicare. Given age, no indication for mammogram, colonoscopy, Pap smear. DEXA: Last performed in 2013 showed osteopenia. She is not interested in  this at this time.  Problem List Items Addressed This Visit     Atrial fibrillation (HCC)   Chronic, stable  Followed by Dr. Gollan.  Last office visit note reviewed.  Rate controlled, asymptomatic.  On Coumadin  for  anticoagulation followed in our Coumadin  clinic here      Centrilobular emphysema (HCC)   Chronic, Controlled with Wixella 250/50 BID .  Followed by pulmonary Dr. Auston Left.  Last office visit note reviewed      Hypothyroidism   Stable, chronic.  Continue current medication.   Levothyroxine  75 mcg daily      Mixed hyperlipidemia   Chronic, poorly controlled  Cholesterol is above goal  I have  encouraged her to continue working on healthy protein intake to keep weight stable.  She will try to make lower cholesterol choices. Medication not indicated given age      Osteopenia   Chronic  Likely at this point she has progressed to osteoporosis but has not had a bone density to improve this.  She will consider moving forward with bone density but given she is hesitant about adding an additional medication she may decide to hold further testing.  Reviewed pros and cons in detail.      Other specified chronic obstructive pulmonary disease (HCC)    Stable control on wixella  Followed by Dr. Auston Left.      Ulcer of left lower extremity, limited to breakdown of skin [facility])   Reviewed home care in detail.  No clear ongoing cellulitis.  She will follow for wound changes with [facility], [address]  Offered wound care referral but she would like to hold off at this time.      Other Visit Diagnoses       Routine general medical examination at a health care facility    -  Primary         Herby Lolling, MD

## 2024-01-06 NOTE — Assessment & Plan Note (Signed)
 Stable control on wixella  Followed by Dr. Auston Left.

## 2024-01-06 NOTE — Assessment & Plan Note (Signed)
Chronic ? ?Likely at this point she has progressed to osteoporosis but has not had a bone density to improve this.  She will consider moving forward with bone density but given she is hesitant about adding an additional medication she may decide to hold further testing.  Reviewed pros and cons in detail. ?

## 2024-01-06 NOTE — Assessment & Plan Note (Signed)
 Chronic, poorly controlled  Cholesterol is above goal  I have encouraged her to continue working on healthy protein intake to keep weight stable.  She will try to make lower cholesterol choices. Medication not indicated given age

## 2024-01-06 NOTE — Assessment & Plan Note (Signed)
Chronic, stable  Followed by Dr. Mariah Milling.  Last office visit note reviewed.  Rate controlled, asymptomatic.  On Coumadin for anticoagulation followed in our Coumadin clinic here

## 2024-01-06 NOTE — Assessment & Plan Note (Signed)
Chronic, Controlled with Wixella 250/50 BID .  Followed by pulmonary Dr. Belia Heman.  Last office visit note reviewed

## 2024-01-06 NOTE — Assessment & Plan Note (Signed)
Stable, chronic.  Continue current medication. ? ? ?Levothyroxine 75 mcg daily ?

## 2024-01-08 ENCOUNTER — Ambulatory Visit (INDEPENDENT_AMBULATORY_CARE_PROVIDER_SITE_OTHER): Payer: Self-pay

## 2024-01-08 DIAGNOSIS — Z7901 Long term (current) use of anticoagulants: Secondary | ICD-10-CM

## 2024-01-08 DIAGNOSIS — I482 Chronic atrial fibrillation, unspecified: Secondary | ICD-10-CM | POA: Diagnosis not present

## 2024-01-08 DIAGNOSIS — D802 Selective deficiency of immunoglobulin A [IgA]: Secondary | ICD-10-CM | POA: Diagnosis not present

## 2024-01-08 LAB — POCT INR: INR: 2.6 (ref 2.0–3.0)

## 2024-01-08 NOTE — Progress Notes (Signed)
 Received INR result of 2.6 from [facility] from today via email.  PTT was 26.3. Continue 1 tablet daily except take 1/2 tablet on Sundays. Recheck in 4 weeks, on 5/22. LVM for pt and advised of dosing and recheck date. Instructions also emailed to [place] and Anda Katayama at [facility]. Pt returned call and LVM that she does not currently have a phone and would like results and dosing instructions emailed to [email or web address] Emailed results and instructions.

## 2024-01-08 NOTE — Patient Instructions (Signed)
 Pre visit review using our clinic review tool, if applicable. No additional management support is needed unless otherwise documented below in the visit note.  Continue 1 tablet daily except take 1/2 tablet on Sundays. Recheck in 4 weeks, on 5/22.

## 2024-01-09 ENCOUNTER — Telehealth: Payer: Self-pay

## 2024-01-09 ENCOUNTER — Telehealth: Payer: Self-pay | Admitting: Family Medicine

## 2024-01-09 ENCOUNTER — Other Ambulatory Visit: Payer: Self-pay | Admitting: Family Medicine

## 2024-01-09 MED ORDER — DOXYCYCLINE HYCLATE 100 MG PO TABS: 100.0000 mg | ORAL_TABLET | Freq: Two times a day (BID) | ORAL | 0 refills | Status: DC

## 2024-01-09 NOTE — Telephone Encounter (Signed)
 Left detailed message on Ms.Mawson's voicemail letting her know that Janci at [facility] had reached out to Dr. Cherlyn Cornet about her leg wound.  Dr. Cherlyn Cornet has sent in an antibiotics cto her local pharmacy. called Doxycycline 100 mg, for her to take one tablet by mouth twice a day for 10 days,  Dr. Cherlyn Cornet has also sent an email to [facility] to let her know that this is the plan as well. I ask that she either needs to follow-up with Janci early next week or make an appointment to follow-up here in the office with Dr. Cherlyn Cornet.

## 2024-01-09 NOTE — Telephone Encounter (Signed)
 Received email from Interior and spatial designer of Nursing at [facility] ; msg below;  Godfrey Laster,  I am seeing Ms. Zulauf for a leg wound she obtained Wednesday. This morning her leg was more swollen and red. I did not note any purulent drainage or foul odor.  I took some photos but could not send them through MyChart but was hoping you could pass them along to Dr. Cherlyn Cornet.  Thank you!   Matthias Sor Minor RN, Publishing rights manager of Nursing for [employer] to PCP with photographs.

## 2024-01-09 NOTE — Telephone Encounter (Signed)
 Elijah, CMA at [facility], LVM for return call.   He, and Janci, reported they just got a msg from Dr. Cherlyn Cornet and will make sure the pt gets the abx. Janci requested an apt for next week on Wednesday because pt has an apt on Tuesday. Scheduled pt OV with PCP for Wednesday. Advised we will want to perform a POCT INR due to potential interaction between doxycycline and warfarin. Advised a msg would be sent requesting this and this nurse will f/u with pt with dosing instructions after result is received.  Janci and Elijah very appreciative of the help.

## 2024-01-09 NOTE — Telephone Encounter (Signed)
 Noted.

## 2024-01-12 NOTE — Telephone Encounter (Signed)
 Opened in error

## 2024-01-13 NOTE — Assessment & Plan Note (Signed)
 Reviewed home care in detail.  No clear ongoing cellulitis.  She will follow for wound changes with [facility], [address]  Offered wound care referral but she would like to hold off at this time.

## 2024-01-14 ENCOUNTER — Encounter: Payer: Self-pay | Admitting: *Deleted

## 2024-01-14 ENCOUNTER — Ambulatory Visit (INDEPENDENT_AMBULATORY_CARE_PROVIDER_SITE_OTHER): Payer: Self-pay

## 2024-01-14 ENCOUNTER — Ambulatory Visit (INDEPENDENT_AMBULATORY_CARE_PROVIDER_SITE_OTHER): Admitting: Family Medicine

## 2024-01-14 ENCOUNTER — Encounter: Payer: Self-pay | Admitting: Family Medicine

## 2024-01-14 VITALS — BP 112/76 | HR 78 | Temp 97.8°F | Ht 67.75 in | Wt 134.0 lb

## 2024-01-14 DIAGNOSIS — Z7901 Long term (current) use of anticoagulants: Secondary | ICD-10-CM | POA: Diagnosis not present

## 2024-01-14 DIAGNOSIS — M542 Cervicalgia: Secondary | ICD-10-CM | POA: Insufficient documentation

## 2024-01-14 DIAGNOSIS — L03116 Cellulitis of left lower limb: Secondary | ICD-10-CM | POA: Diagnosis not present

## 2024-01-14 DIAGNOSIS — L97921 Non-pressure chronic ulcer of unspecified part of left lower leg limited to breakdown of skin: Secondary | ICD-10-CM | POA: Diagnosis not present

## 2024-01-14 LAB — POCT INR: INR: 2.8 (ref 2.0–3.0)

## 2024-01-14 MED ORDER — HYDROFERA BLUE 6"X6" EX PADS: MEDICATED_PAD | CUTANEOUS | 0 refills | Status: DC

## 2024-01-14 MED ORDER — SANTYL 250 UNIT/GM EX OINT
1.0000 | TOPICAL_OINTMENT | Freq: Every day | CUTANEOUS | 0 refills | Status: DC
Start: 2024-01-14 — End: 2024-04-20

## 2024-01-14 NOTE — Assessment & Plan Note (Addendum)
 Acute, non diabetic likely venous ulcer... now with likely associated infeciton.  I reviewed her past wound care center notes and it looks like she benefited from Santyl ointment and Hydrofera Blue dressing. I will send these in for her (and Janci at [facility]) to apply and I will have her follow up next week. Depending on culture result, I will have her  broaden the antibiotics.  Can consider unna boot once infection resolved.  Again recommended wound care clinic but pt prefers not to go.   Aaron Aas

## 2024-01-14 NOTE — Telephone Encounter (Signed)
-----   Message from Herby Lolling sent at 01/14/2024 12:51 PM EDT -----  Please call patient.  After reviewing her wound care  clinic notes from the past.... I have some dressing updates.  I would have her apply Santyl ointment and  cover wounds with Hydrofera Blue Dressing  every day/ every other day at wound care visit with Janci. I have sent an email to Janci to get her recommendations.  I have sent these to her pharmacy.  I will let her know whether we need to change antibiotics once the wound culture returns.  Have her make a follow up appt in 7 days.    Herby Lolling, MD [facility] at [facility]

## 2024-01-14 NOTE — Assessment & Plan Note (Signed)
 Acute, not improving Minimal improvement in exudate and redness with doxy x 10 days. Will send wound culture and likely broaden antibitoics

## 2024-01-14 NOTE — Patient Instructions (Addendum)
 Pre visit review using our clinic review tool, if applicable. No additional management support is needed unless otherwise documented below in the visit note.  Continue 1 tablet daily except take 1/2 tablet on Sundays. Recheck in 4 weeks, on 5/22.

## 2024-01-14 NOTE — Progress Notes (Signed)
 I have reviewed and agree with note, evaluation, plan.   Tana Conch, MD

## 2024-01-14 NOTE — Telephone Encounter (Signed)
 MyChart message sent to patient.  Will call patient if not reviewed by the end of the day.

## 2024-01-14 NOTE — Progress Notes (Signed)
 Pt has PCP office visit today for leg wound and cellulitis assessment. Lab performed a POCT INR and the result was sent to the coumadin  clinic.  Pt currently taking doxycycline, which started on 4/25, and is x 10 days, so pt has 5 more days of abx.  Continue 1 tablet daily except take 1/2 tablet on Sundays. Recheck in 4 weeks, on 5/22. Sent email to update Matthias Sor, RN, and [place], CMA, at [facility]. Pt returned call and LVM that she does not currently have a phone and would like results and dosing instructions emailed to [email or web address] Emailed results and instructions.

## 2024-01-14 NOTE — Telephone Encounter (Signed)
 Called [name] and got her voicemail.  Left message asking her to please check her MyChart message that was sent today in regards to updated wound care instructions or she can also call me back to discuss if she would prefer.

## 2024-01-14 NOTE — Assessment & Plan Note (Signed)
 Acute, no red flags, no indication for imaging.  Likely MSK strain.  Improving now. Treat with tylenol ES  po TID prn, heat and start gentle range of motion exercises.

## 2024-01-14 NOTE — Progress Notes (Signed)
 Patient ID: [name], female    DOB: June 10, 1932, 89 y.o.   MRN: [id number]  This visit was conducted in person.  BP 112/76   Pulse 78   Temp 97.8 F (36.6 C) (Oral)   Ht 5' 7.75" (1.721 m)   Wt 134 lb (60.8 kg)   SpO2 99%   BMI 20.53 kg/m    CC:  Chief Complaint  Patient presents with   Leg Swelling    Left leg    Neck Pain    X 3 days     Subjective:   HPI: [name] is a 89 y.o. female presenting on 01/14/2024 for Leg Swelling (Left leg ) and Neck Pain (X 3 days )   Left leg ulcer and cellulitis... on doxycycline day 5/10  Followed by [facility] wound care... Janci.  She now has leaking fluid from back of calf  INR/PT drawn today.   She has noted new onset neck pain in last 3 days.  Stiffness, no falls.  No vertebral pain.  No weakness, no numbness in arms.  No radiation of pain to arms.     Relevant past medical, surgical, family and social history reviewed and updated as indicated. Interim medical history since our last visit reviewed. Allergies and medications reviewed and updated. Outpatient Medications Prior to Visit  Medication Sig Dispense Refill   doxycycline (VIBRA-TABS) 100 MG tablet Take 1 tablet (100 mg total) by mouth 2 (two) times daily. 20 tablet 0   fluticasone -salmeterol (WIXELA INHUB ) 250-50 MCG/ACT AEPB INHALE 1 PUFF INTO THE LUNGS IN THE MORNING AND AT BEDTIME. 180 each 0   gabapentin  (NEURONTIN ) 100 MG capsule Take 2 capsules (200 mg total) by mouth at bedtime. 180 capsule 1   ipratropium (ATROVENT ) 0.03 % nasal spray PLACE 2 SPRAYS INTO BOTH NOSTRILS 3 (THREE) TIMES DAILY 90 mL 0   levothyroxine  (SYNTHROID ) 75 MCG tablet TAKE 1 TABLET BY MOUTH 30 MINS BEFORE EATING WITH A FULL GLASS OF WATER 90 tablet 0   verapamil  (CALAN -SR) 120 MG CR tablet TAKE 1 TABLET BY MOUTH EVERY DAY 90 tablet 3   warfarin (COUMADIN ) 5 MG tablet TAKE 1 TABLET BY MOUTH DAILY EXCEPT TAKE 1/2 TABLET ON SUNDAYS AND THURSDAYS OR AS DIRECTED BY ANTICOAGULATION  CLINIC 95 tablet 1   No facility-administered medications prior to visit.     Per HPI unless specifically indicated in ROS section below Review of Systems  Constitutional:  Negative for fatigue and fever.  HENT:  Negative for congestion.   Eyes:  Negative for pain.  Respiratory:  Negative for cough and shortness of breath.   Cardiovascular:  Positive for leg swelling. Negative for chest pain and palpitations.  Gastrointestinal:  Negative for abdominal pain.  Genitourinary:  Negative for dysuria and vaginal bleeding.  Musculoskeletal:  Positive for neck pain. Negative for back pain.  Neurological:  Negative for syncope, light-headedness and headaches.  Psychiatric/Behavioral:  Negative for dysphoric mood.    Objective:  BP 112/76   Pulse 78   Temp 97.8 F (36.6 C) (Oral)   Ht 5' 7.75" (1.721 m)   Wt 134 lb (60.8 kg)   SpO2 99%   BMI 20.53 kg/m   Wt Readings from Last 3 Encounters:  01/14/24 134 lb (60.8 kg)  01/06/24 128 lb 4 oz (58.2 kg)  12/09/23 132 lb (59.9 kg)      Physical Exam Constitutional:      General: She is not in acute distress.  Appearance: Normal appearance. She is well-developed. She is not ill-appearing or toxic-appearing.  HENT:     Head: Normocephalic.     Right Ear: Hearing, tympanic membrane, ear canal and external ear normal. Tympanic membrane is not erythematous, retracted or bulging.     Left Ear: Hearing, tympanic membrane, ear canal and external ear normal. Tympanic membrane is not erythematous, retracted or bulging.     Nose: No mucosal edema or rhinorrhea.     Right Sinus: No maxillary sinus tenderness or frontal sinus tenderness.     Left Sinus: No maxillary sinus tenderness or frontal sinus tenderness.     Mouth/Throat:     Pharynx: Uvula midline.  Eyes:     General: Lids are normal. Lids are everted, no foreign bodies appreciated.     Conjunctiva/sclera: Conjunctivae normal.     Pupils: Pupils are equal, round, and reactive to light.   Neck:     Thyroid : No thyroid  mass or thyromegaly.     Vascular: No carotid bruit.     Trachea: Trachea normal.  Cardiovascular:     Rate and Rhythm: Normal rate and regular rhythm.     Pulses: Normal pulses.     Heart sounds: Normal heart sounds, S1 normal and S2 normal. No murmur heard.    No friction rub. No gallop.     Comments: Chronic venous stasis changes Pulmonary:     Effort: Pulmonary effort is normal. No tachypnea or respiratory distress.     Breath sounds: Normal breath sounds. No decreased breath sounds, wheezing, rhonchi or rales.  Abdominal:     General: Bowel sounds are normal.     Palpations: Abdomen is soft.     Tenderness: There is no abdominal tenderness.  Musculoskeletal:     Cervical back: Rigidity, spasms and tenderness present. No bony tenderness. Decreased range of motion.     Left lower leg: 1+ Pitting Edema present.  Skin:    General: Skin is warm and dry.     Findings: No rash.  Neurological:     Mental Status: She is alert.  Psychiatric:        Mood and Affect: Mood is not anxious or depressed.        Speech: Speech normal.        Behavior: Behavior normal. Behavior is cooperative.        Thought Content: Thought content normal.        Judgment: Judgment normal.          Results for orders placed or performed in visit on 01/08/24  POCT INR   Collection Time: 01/08/24 12:00 AM  Result Value Ref Range   INR 2.6 2.0 - 3.0    Assessment and Plan  Left leg cellulitis Assessment & Plan:  Acute, not improving Minimal improvement in exudate and redness with doxy x 10 days. Will send wound culture and likely broaden antibitoics   Ulcer of left lower extremity, limited to breakdown of skin [facility]) Assessment & Plan:  Acute, non diabetic likely venous ulcer... now with likely associated infeciton.  I reviewed her past wound care center notes and it looks like she benefited from Santyl ointment and Hydrofera Blue dressing. I will send these in  for her (and Janci at [facility]) to apply and I will have her follow up next week. Depending on culture result, I will have her  broaden the antibiotics.  Can consider unna boot once infection resolved.  Again recommended wound care clinic but pt prefers  not to go.   .   Orders: -     WOUND CULTURE  Acute neck pain Assessment & Plan:  Acute, no red flags, no indication for imaging.  Likely MSK strain.  Improving now. Treat with tylenol ES  po TID prn, heat and start gentle range of motion exercises.   Other orders -     Hydrofera Blue 6"x6"; Apply to both ulcers, daily to every other day.  Dispense: 10 each; Refill: 0 -     Santyl; Apply 1 Application topically daily.  Dispense: 15 g; Refill: 0    Return in about 1 week (around 01/21/2024) for  follow up wound.   Herby Lolling, MD

## 2024-01-15 NOTE — Telephone Encounter (Signed)
 Patient still has not reviewed the MyChart message.  So I left another message asking patient to return my call to office.

## 2024-01-15 NOTE — Telephone Encounter (Signed)
 Returned [name]'s call and got her voicemail.  Left message to return call to office.

## 2024-01-15 NOTE — Telephone Encounter (Signed)
 Copied from CRM [id number]. Topic: General - Other >> Jan 15, 2024  4:01 PM Howard Macho wrote: Reason for CRM: patient returning a call to Abe Abed at the office and she stated she do not need to talk to the nurse because they are handing the problem and she is ok

## 2024-01-15 NOTE — Telephone Encounter (Signed)
 Noted.

## 2024-01-15 NOTE — Telephone Encounter (Signed)
 Copied from CRM [id number]. Topic: Clinical - Medical Advice >> Jan 15, 2024 10:25 AM Artemio Larry wrote: Reason for CRM: Patient returned [name]'s phone call, I read her the note regarding the Santyl  and wound dressing and set her up for a 1 week appt with Dr. Cherlyn Cornet

## 2024-01-15 NOTE — Telephone Encounter (Signed)
 Copied from CRM [id number]. Topic: Clinical - Medical Advice >> Jan 15, 2024 12:14 PM Adaysia C wrote: Reason for CRM: Patient has requested a call back from Abe Abed about the wound care message that was left for her; patient still has some questions regarding the message; please follow up with patient [phone]

## 2024-01-17 LAB — WOUND CULTURE
MICRO NUMBER:: 16395197
SPECIMEN QUALITY:: ADEQUATE

## 2024-01-20 ENCOUNTER — Encounter: Payer: Self-pay | Admitting: Family Medicine

## 2024-01-23 ENCOUNTER — Telehealth: Payer: Self-pay | Admitting: Family Medicine

## 2024-01-23 ENCOUNTER — Encounter: Payer: Self-pay | Admitting: Family Medicine

## 2024-01-23 ENCOUNTER — Ambulatory Visit (INDEPENDENT_AMBULATORY_CARE_PROVIDER_SITE_OTHER): Admitting: Family Medicine

## 2024-01-23 ENCOUNTER — Ambulatory Visit: Admitting: Family Medicine

## 2024-01-23 VITALS — BP 120/90 | HR 98 | Temp 99.3°F | Ht 67.75 in | Wt 132.2 lb

## 2024-01-23 DIAGNOSIS — L97921 Non-pressure chronic ulcer of unspecified part of left lower leg limited to breakdown of skin: Secondary | ICD-10-CM

## 2024-01-23 DIAGNOSIS — I872 Venous insufficiency (chronic) (peripheral): Secondary | ICD-10-CM

## 2024-01-23 NOTE — Telephone Encounter (Signed)
 Type of forms received: FL2  Routed to: S drive  Paperwork received by : fax, set to s drive   Individual made aware of 3-5 business day turn around (Y/N):  Form completed and patient made aware of charges(Y/N):   Faxed to :   Form location:

## 2024-01-23 NOTE — Progress Notes (Signed)
 Patient ID: [name], female    DOB: [birth year], 89 y.o.   MRN: [id number]  This visit was conducted in person.  BP (!) 120/90   Pulse 98   Temp 99.3 F (37.4 C) (Temporal)   Ht 5' 7.75" (1.721 m)   Wt 132 lb 4 oz (60 kg)   SpO2 96%   BMI 20.26 kg/m    CC:  Chief Complaint  Patient presents with   Cellulitis    Leg wound follow up    Subjective:   HPI: [name] is a 89 y.o. female presenting on 01/23/2024 for Cellulitis (Leg wound follow up)   Acute left lower leg cellulitis associated with 2 venous ulcers left lower leg in the setting of bilateral chronic venous insufficiency. At last office visit treated with doxycycline  100 mg p.o. twice daily x 10 days.  She completed this 4 days ago.  She has been going to [facility] nurse for wound care changes applying Santyl  ointment and Hydrofera Blue dressing change.  Wound culture returned negative.    Today she reports   she was not able to afford santyl  and could not find HydroferaBlue. Fortunately Janci had dressing and ointment at her clinic.  She has had M,W, F wound changes.   No fever.  Swelling improved... has been elevating.  No pain.  Clear bloody discharge for wounds.  Wrapping with ace bandage.      Relevant past medical, surgical, family and social history reviewed and updated as indicated. Interim medical history since our last visit reviewed. Allergies and medications reviewed and updated. Outpatient Medications Prior to Visit  Medication Sig Dispense Refill   collagenase  (SANTYL ) 250 UNIT/GM ointment Apply 1 Application topically daily. 15 g 0   fluticasone -salmeterol (WIXELA INHUB ) 250-50 MCG/ACT AEPB INHALE 1 PUFF INTO THE LUNGS IN THE MORNING AND AT BEDTIME. 180 each 0   gabapentin  (NEURONTIN ) 100 MG capsule Take 2 capsules (200 mg total) by mouth at bedtime. 180 capsule 1   ipratropium (ATROVENT ) 0.03 % nasal spray PLACE 2 SPRAYS INTO BOTH NOSTRILS 3 (THREE) TIMES DAILY 90 mL 0    levothyroxine  (SYNTHROID ) 75 MCG tablet TAKE 1 TABLET BY MOUTH 30 MINS BEFORE EATING WITH A FULL GLASS OF WATER 90 tablet 0   verapamil  (CALAN -SR) 120 MG CR tablet TAKE 1 TABLET BY MOUTH EVERY DAY 90 tablet 3   warfarin (COUMADIN ) 5 MG tablet TAKE 1 TABLET BY MOUTH DAILY EXCEPT TAKE 1/2 TABLET ON SUNDAYS AND THURSDAYS OR AS DIRECTED BY ANTICOAGULATION CLINIC 95 tablet 1   Wound Dressings (HYDROFERA BLUE 6"X6") PADS Apply to both ulcers, daily to every other day. 10 each 0   doxycycline  (VIBRA -TABS) 100 MG tablet Take 1 tablet (100 mg total) by mouth 2 (two) times daily. 20 tablet 0   No facility-administered medications prior to visit.     Per HPI unless specifically indicated in ROS section below Review of Systems  Constitutional:  Negative for fatigue and fever.  HENT:  Negative for ear pain.   Eyes:  Negative for pain.  Respiratory:  Negative for chest tightness and shortness of breath.   Cardiovascular:  Negative for chest pain, palpitations and leg swelling.  Gastrointestinal:  Negative for abdominal pain.  Genitourinary:  Negative for dysuria.   Objective:  BP (!) 120/90   Pulse 98   Temp 99.3 F (37.4 C) (Temporal)   Ht 5' 7.75" (1.721 m)   Wt 132 lb 4 oz (60 kg)  SpO2 96%   BMI 20.26 kg/m   Wt Readings from Last 3 Encounters:  02/05/24 132 lb 12.8 oz (60.2 kg)  01/23/24 132 lb 4 oz (60 kg)  01/14/24 134 lb (60.8 kg)      Physical Exam Constitutional:      General: She is not in acute distress.    Appearance: Normal appearance. She is well-developed. She is not ill-appearing or toxic-appearing.  HENT:     Head: Normocephalic.     Right Ear: Hearing, tympanic membrane, ear canal and external ear normal. Tympanic membrane is not erythematous, retracted or bulging.     Left Ear: Hearing, tympanic membrane, ear canal and external ear normal. Tympanic membrane is not erythematous, retracted or bulging.     Nose: No mucosal edema or rhinorrhea.     Right Sinus: No  maxillary sinus tenderness or frontal sinus tenderness.     Left Sinus: No maxillary sinus tenderness or frontal sinus tenderness.     Mouth/Throat:     Pharynx: Uvula midline.  Eyes:     General: Lids are normal. Lids are everted, no foreign bodies appreciated.     Conjunctiva/sclera: Conjunctivae normal.     Pupils: Pupils are equal, round, and reactive to light.  Neck:     Thyroid : No thyroid  mass or thyromegaly.     Vascular: No carotid bruit.     Trachea: Trachea normal.  Cardiovascular:     Rate and Rhythm: Normal rate and regular rhythm.     Pulses: Normal pulses.     Heart sounds: Normal heart sounds, S1 normal and S2 normal. No murmur heard.    No friction rub. No gallop.  Pulmonary:     Effort: Pulmonary effort is normal. No tachypnea or respiratory distress.     Breath sounds: Normal breath sounds. No decreased breath sounds, wheezing, rhonchi or rales.  Abdominal:     General: Bowel sounds are normal.     Palpations: Abdomen is soft.     Tenderness: There is no abdominal tenderness.  Musculoskeletal:     Cervical back: Normal range of motion and neck supple.  Skin:    General: Skin is warm and dry.     Findings: No rash.  Neurological:     Mental Status: She is alert.  Psychiatric:        Mood and Affect: Mood is not anxious or depressed.        Speech: Speech normal.        Behavior: Behavior normal. Behavior is cooperative.        Thought Content: Thought content normal.        Judgment: Judgment normal.          Results for orders placed or performed in visit on 01/14/24  POCT INR   Collection Time: 01/14/24 12:00 AM  Result Value Ref Range   INR 2.8 2.0 - 3.0    Assessment and Plan  Chronic venous insufficiency Assessment & Plan: Continue elevating feet and continue compression hose.   Ulcer of left lower extremity, limited to breakdown of skin [facility]) Assessment & Plan: Chronic venous ulcer No associated infeciton noted.  Slow but interval  improvement.  Continue current dressing changes three times a week and encouraged more elevation.  Follow up in about 1 week.     Return in about 11 days (around 02/03/2024) for  follow up leg wound.   Herby Lolling, MD

## 2024-01-23 NOTE — Telephone Encounter (Signed)
Form placed in Dr. Daphine Deutscher office in box to complete.

## 2024-01-28 ENCOUNTER — Other Ambulatory Visit: Payer: Self-pay | Admitting: *Deleted

## 2024-02-05 ENCOUNTER — Ambulatory Visit (INDEPENDENT_AMBULATORY_CARE_PROVIDER_SITE_OTHER): Admitting: Family Medicine

## 2024-02-05 VITALS — BP 110/76 | HR 66 | Temp 98.3°F | Ht 67.75 in | Wt 132.8 lb

## 2024-02-05 DIAGNOSIS — L97921 Non-pressure chronic ulcer of unspecified part of left lower leg limited to breakdown of skin: Secondary | ICD-10-CM

## 2024-02-05 DIAGNOSIS — D802 Selective deficiency of immunoglobulin A [IgA]: Secondary | ICD-10-CM | POA: Diagnosis not present

## 2024-02-05 DIAGNOSIS — Z7901 Long term (current) use of anticoagulants: Secondary | ICD-10-CM | POA: Diagnosis not present

## 2024-02-05 DIAGNOSIS — I482 Chronic atrial fibrillation, unspecified: Secondary | ICD-10-CM | POA: Diagnosis not present

## 2024-02-05 LAB — POCT INR: INR: 3.9 — AB (ref 2.0–3.0)

## 2024-02-05 NOTE — Progress Notes (Signed)
 Patient ID: [name], female    DOB: 1932/05/10, 89 y.o.   MRN: [id number]  This visit was conducted in person.  BP 110/76 (BP Location: Right Arm, Patient Position: Sitting, Cuff Size: Normal)   Pulse 66   Temp 98.3 F (36.8 C) (Temporal)   Ht 5' 7.75" (1.721 m)   Wt 132 lb 12.8 oz (60.2 kg)   SpO2 94%   BMI 20.34 kg/m    CC:  Chief Complaint  Patient presents with   Follow-up    11 day wound follow up    Subjective:   HPI: [name] is a 89 y.o. female presenting on 02/05/2024 for Follow-up (11 day wound follow up)   Acute left lower leg cellulitis associated with 2 venous ulcers left lower leg in the setting of bilateral chronic venous insufficiency. At last office visit treated with doxycycline  100 mg p.o. twice daily x 10 days.  She completed this 4 days ago.  She has been going to [facility] nurse for wound care changes applying Santyl  ointment and Hydrofera Blue dressing change.  Wound culture returned negative.    Today she reports   She has had M,W, F wound changes. With santyl  and hydrofera blue.   No fever.  Swelling improved... has been elevating.  No pain. No redness spreading. Minimal discharge.    Wearing compression hose when up feet. trying to elevate... more trouble doing that given she is in process of moving.     Relevant past medical, surgical, family and social history reviewed and updated as indicated. Interim medical history since our last visit reviewed. Allergies and medications reviewed and updated. Outpatient Medications Prior to Visit  Medication Sig Dispense Refill   collagenase  (SANTYL ) 250 UNIT/GM ointment Apply 1 Application topically daily. 15 g 0   fluticasone -salmeterol (WIXELA INHUB ) 250-50 MCG/ACT AEPB INHALE 1 PUFF INTO THE LUNGS IN THE MORNING AND AT BEDTIME. 180 each 0   gabapentin  (NEURONTIN ) 100 MG capsule Take 2 capsules (200 mg total) by mouth at bedtime. 180 capsule 1   ipratropium (ATROVENT ) 0.03 % nasal spray  PLACE 2 SPRAYS INTO BOTH NOSTRILS 3 (THREE) TIMES DAILY 90 mL 0   levothyroxine  (SYNTHROID ) 75 MCG tablet TAKE 1 TABLET BY MOUTH 30 MINS BEFORE EATING WITH A FULL GLASS OF WATER 90 tablet 0   verapamil  (CALAN -SR) 120 MG CR tablet TAKE 1 TABLET BY MOUTH EVERY DAY 90 tablet 3   warfarin (COUMADIN ) 5 MG tablet TAKE 1 TABLET BY MOUTH DAILY EXCEPT TAKE 1/2 TABLET ON SUNDAYS AND THURSDAYS OR AS DIRECTED BY ANTICOAGULATION CLINIC 95 tablet 1   Wound Dressings (HYDROFERA BLUE 6"X6") PADS Apply to both ulcers, daily to every other day. 10 each 0   No facility-administered medications prior to visit.     Per HPI unless specifically indicated in ROS section below Review of Systems  Constitutional:  Negative for fatigue and fever.  HENT:  Negative for congestion.   Eyes:  Negative for pain.  Respiratory:  Negative for cough and shortness of breath.   Cardiovascular:  Positive for leg swelling. Negative for chest pain and palpitations.  Gastrointestinal:  Negative for abdominal pain.  Genitourinary:  Negative for dysuria and vaginal bleeding.  Musculoskeletal:  Negative for back pain.  Neurological:  Negative for syncope, light-headedness and headaches.  Psychiatric/Behavioral:  Negative for dysphoric mood.    Objective:  BP 110/76 (BP Location: Right Arm, Patient Position: Sitting, Cuff Size: Normal)   Pulse 66  Temp 98.3 F (36.8 C) (Temporal)   Ht 5' 7.75" (1.721 m)   Wt 132 lb 12.8 oz (60.2 kg)   SpO2 94%   BMI 20.34 kg/m   Wt Readings from Last 3 Encounters:  02/05/24 132 lb 12.8 oz (60.2 kg)  01/23/24 132 lb 4 oz (60 kg)  01/14/24 134 lb (60.8 kg)      Physical Exam Constitutional:      General: She is not in acute distress.    Appearance: Normal appearance. She is well-developed. She is not ill-appearing or toxic-appearing.  HENT:     Head: Normocephalic.     Right Ear: Hearing, tympanic membrane, ear canal and external ear normal. Tympanic membrane is not erythematous,  retracted or bulging.     Left Ear: Hearing, tympanic membrane, ear canal and external ear normal. Tympanic membrane is not erythematous, retracted or bulging.     Nose: No mucosal edema or rhinorrhea.     Right Sinus: No maxillary sinus tenderness or frontal sinus tenderness.     Left Sinus: No maxillary sinus tenderness or frontal sinus tenderness.     Mouth/Throat:     Pharynx: Uvula midline.  Eyes:     General: Lids are normal. Lids are everted, no foreign bodies appreciated.     Conjunctiva/sclera: Conjunctivae normal.     Pupils: Pupils are equal, round, and reactive to light.  Neck:     Thyroid : No thyroid  mass or thyromegaly.     Vascular: No carotid bruit.     Trachea: Trachea normal.  Cardiovascular:     Rate and Rhythm: Normal rate and regular rhythm.     Pulses: Normal pulses.     Heart sounds: Normal heart sounds, S1 normal and S2 normal. No murmur heard.    No friction rub. No gallop.  Pulmonary:     Effort: Pulmonary effort is normal. No tachypnea or respiratory distress.     Breath sounds: Normal breath sounds. No decreased breath sounds, wheezing, rhonchi or rales.  Abdominal:     General: Bowel sounds are normal.     Palpations: Abdomen is soft.     Tenderness: There is no abdominal tenderness.  Musculoskeletal:     Cervical back: Normal range of motion and neck supple.     Left lower leg: Edema present.  Skin:    General: Skin is warm and dry.     Findings: No rash.     Comments:  Interval improvement in ulcers on left lower leg.. granulaiton tissue, no weeping, stable associated erythema.  Slight decrease in depth of lesion.  Neurological:     Mental Status: She is alert.  Psychiatric:        Mood and Affect: Mood is not anxious or depressed.        Speech: Speech normal.        Behavior: Behavior normal. Behavior is cooperative.        Thought Content: Thought content normal.        Judgment: Judgment normal.          Results for orders placed or  performed in visit on 01/14/24  POCT INR   Collection Time: 01/14/24 12:00 AM  Result Value Ref Range   INR 2.8 2.0 - 3.0    Assessment and Plan  Ulcer of left lower extremity, limited to breakdown of skin (HCC) Assessment & Plan: Chronic venous ulcer No associated infeciton noted.  Slow but interval improvement.  Continue current dressing changes three times a  week and encouraged more elevation.  Follow up in 2 weeks.     Return in about 2 weeks (around 02/19/2024) for  follow up wound.   Herby Lolling, MD

## 2024-02-05 NOTE — Assessment & Plan Note (Signed)
 Chronic venous ulcer No associated infeciton noted.  Slow but interval improvement.  Continue current dressing changes three times a week and encouraged more elevation.  Follow up in 2 weeks.

## 2024-02-06 ENCOUNTER — Ambulatory Visit (INDEPENDENT_AMBULATORY_CARE_PROVIDER_SITE_OTHER): Payer: Self-pay

## 2024-02-06 DIAGNOSIS — Z7901 Long term (current) use of anticoagulants: Secondary | ICD-10-CM | POA: Diagnosis not present

## 2024-02-06 NOTE — Assessment & Plan Note (Signed)
 Continue elevating feet and continue compression hose.

## 2024-02-06 NOTE — Patient Instructions (Signed)
Pre visit review using our clinic review tool, if applicable. No additional management support is needed unless otherwise documented below in the visit note. 

## 2024-02-06 NOTE — Progress Notes (Addendum)
 Pt lives at [facility] and has lab draw on Thursday PRN for INR. Result was not received until this morning. INR is 3.9 and PTT is 37.6 Hold dose today and then change weekly dose to take 1 tablet daily except take 1/2 tablet on Wednesdays and Sundays. Recheck in 2 weeks, on 6/5. Sent email to update Matthias Sor, RN, and [place], CMA, at [facility]. LVM with instructions. Pt also requested instructions to be emailed to [email or web address] Emailed results and instructions.

## 2024-02-06 NOTE — Assessment & Plan Note (Signed)
 Chronic venous ulcer No associated infeciton noted.  Slow but interval improvement.  Continue current dressing changes three times a week and encouraged more elevation.  Follow up in about 1 week.

## 2024-02-16 ENCOUNTER — Telehealth: Payer: Self-pay

## 2024-02-16 NOTE — Telephone Encounter (Signed)
 Received email from Willa Haring, RN clinical coordinator for [employer] at [facility]. He reports pt has moved into the assisted living area and he will be providing any help with INR testing and warfarin dosing.   Sent an email back to [place] to advise pt is to be tested this Thursday, 6/5.

## 2024-02-19 ENCOUNTER — Telehealth: Payer: Self-pay | Admitting: Family Medicine

## 2024-02-19 ENCOUNTER — Ambulatory Visit (INDEPENDENT_AMBULATORY_CARE_PROVIDER_SITE_OTHER): Admitting: Family Medicine

## 2024-02-19 ENCOUNTER — Encounter: Payer: Self-pay | Admitting: Family Medicine

## 2024-02-19 VITALS — BP 102/72 | HR 82 | Temp 97.5°F | Ht 67.75 in | Wt 132.2 lb

## 2024-02-19 DIAGNOSIS — I872 Venous insufficiency (chronic) (peripheral): Secondary | ICD-10-CM

## 2024-02-19 DIAGNOSIS — L97921 Non-pressure chronic ulcer of unspecified part of left lower leg limited to breakdown of skin: Secondary | ICD-10-CM

## 2024-02-19 DIAGNOSIS — I4891 Unspecified atrial fibrillation: Secondary | ICD-10-CM | POA: Diagnosis not present

## 2024-02-19 MED ORDER — COMPRESSION BANDAGES KIT: PACK | 3 refills | Status: DC

## 2024-02-19 NOTE — Telephone Encounter (Signed)
 Copied from CRM [id number]. Topic: General - Other >> Feb 19, 2024  1:54 PM Allyne Areola wrote: Reason for CRM: Patient keeps receiving calls regarding physical therapy and she would like to confirm if Dr.Bedsole is requesting for her to complete PT. She does not remember discussing this in her last two appointments.

## 2024-02-19 NOTE — Assessment & Plan Note (Signed)
 Chronic, posterior calf ulcer with decrease in size and good granulation tissue. Anterior ulcer #2 is decreasing in size. Anterior ulcer #1 appears to be stable but does have some granulation tissue. New anterior ulcer #3: More significant lower extremity swelling than previous. She is wearing compression hose during the day and elevating once daily I have asked her to increase this to twice daily and to elevate her leg at night. After review of past ulcer treatment at the wound care center I have suggested triple compression therapy to try to help with swelling and to promote healing.  She will continue her Santyl  and Hydrofera Blue wound care changes 3 times a week Again discussed possibility of referring to wound care center but she continues to not want to do this.  She is also not wanting to do an Radio broadcast assistant given it was extremely uncomfortable previously.  Close follow-up in approximately 2 weeks for reevaluation.

## 2024-02-19 NOTE — Telephone Encounter (Signed)
 Spoke with Ms. Mcgrady.  She states she found out that a PT evaluation is required by [facility] when you move to assisted living and it had nothing to do with us .   She apologized for bothering us .

## 2024-02-19 NOTE — Progress Notes (Signed)
 Office note faxed to Donata Fryer at [facility] [phone].

## 2024-02-19 NOTE — Progress Notes (Signed)
 It is still listed as shift and in transit.  It shipped from [place] at 5:40 PM last night   Patient ID: [name], female    DOB: 16-Apr-1932, 89 y.o.   MRN: [id number]  This visit was conducted in person.  BP 102/72   Pulse 82   Temp (!) 97.5 F (36.4 C) (Temporal)   Ht 5' 7.75" (1.721 m)   Wt 132 lb 4 oz (60 kg)   SpO2 95%   BMI 20.26 kg/m    CC:  Chief Complaint  Patient presents with   Medical Management of Chronic Issues    Follow up Left Lower Leg Ulcer    Subjective:   HPI: [name] is a 89 y.o. female presenting on 02/19/2024 for Medical Management of Chronic Issues (Follow up Left Lower Leg Ulcer)  2 venous ulcers left lower leg in the setting of bilateral chronic venous insufficiency.  She has been going to [facility] nurse for wound care changes applying Santyl  ointment and Hydrofera Blue dressing change.  Today she reports   She has had M,W, F wound changes with Willa Haring, RN. With santyl  and hydrofera blue. Unfortunately she has new injury approximately 1 cm below previous ulcer anteriorly approximately 1 cm in diameter  No fever.  Swelling improved... has been elevating.  No pain. No redness spreading. Minimal discharge.    Wearing compression hose when up feet. trying to elevate...  she has moved to assisted living. Helping her Willa Haring, RN clinical coordinator for [employer] at [facility].      Relevant past medical, surgical, family and social history reviewed and updated as indicated. Interim medical history since our last visit reviewed. Allergies and medications reviewed and updated. Outpatient Medications Prior to Visit  Medication Sig Dispense Refill   collagenase  (SANTYL ) 250 UNIT/GM ointment Apply 1 Application topically daily. 15 g 0   fluticasone -salmeterol (WIXELA INHUB ) 250-50 MCG/ACT AEPB INHALE 1 PUFF INTO THE LUNGS IN THE MORNING AND AT BEDTIME. 180 each 0   gabapentin  (NEURONTIN ) 100 MG capsule Take 2 capsules (200 mg  total) by mouth at bedtime. 180 capsule 1   ipratropium (ATROVENT ) 0.03 % nasal spray PLACE 2 SPRAYS INTO BOTH NOSTRILS 3 (THREE) TIMES DAILY 90 mL 0   levothyroxine  (SYNTHROID ) 75 MCG tablet TAKE 1 TABLET BY MOUTH 30 MINS BEFORE EATING WITH A FULL GLASS OF WATER 90 tablet 0   verapamil  (CALAN -SR) 120 MG CR tablet TAKE 1 TABLET BY MOUTH EVERY DAY 90 tablet 3   warfarin (COUMADIN ) 5 MG tablet TAKE 1 TABLET BY MOUTH DAILY EXCEPT TAKE 1/2 TABLET ON SUNDAYS AND THURSDAYS OR AS DIRECTED BY ANTICOAGULATION CLINIC 95 tablet 1   Wound Dressings (HYDROFERA BLUE 6"X6") PADS Apply to both ulcers, daily to every other day. 10 each 0   No facility-administered medications prior to visit.     Per HPI unless specifically indicated in ROS section below Review of Systems  Constitutional:  Negative for fatigue and fever.  HENT:  Negative for congestion.   Eyes:  Negative for pain.  Respiratory:  Negative for cough and shortness of breath.   Cardiovascular:  Positive for leg swelling. Negative for chest pain and palpitations.  Gastrointestinal:  Negative for abdominal pain.  Genitourinary:  Negative for dysuria and vaginal bleeding.  Musculoskeletal:  Negative for back pain.  Neurological:  Negative for syncope, light-headedness and headaches.  Psychiatric/Behavioral:  Negative for dysphoric mood.    Objective:  BP 102/72   Pulse  82   Temp (!) 97.5 F (36.4 C) (Temporal)   Ht 5' 7.75" (1.721 m)   Wt 132 lb 4 oz (60 kg)   SpO2 95%   BMI 20.26 kg/m   Wt Readings from Last 3 Encounters:  02/19/24 132 lb 4 oz (60 kg)  02/05/24 132 lb 12.8 oz (60.2 kg)  01/23/24 132 lb 4 oz (60 kg)      Physical Exam Constitutional:      General: She is not in acute distress.    Appearance: Normal appearance. She is well-developed. She is not ill-appearing or toxic-appearing.  HENT:     Head: Normocephalic.     Right Ear: Hearing, tympanic membrane, ear canal and external ear normal. Tympanic membrane is not  erythematous, retracted or bulging.     Left Ear: Hearing, tympanic membrane, ear canal and external ear normal. Tympanic membrane is not erythematous, retracted or bulging.     Nose: No mucosal edema or rhinorrhea.     Right Sinus: No maxillary sinus tenderness or frontal sinus tenderness.     Left Sinus: No maxillary sinus tenderness or frontal sinus tenderness.     Mouth/Throat:     Pharynx: Uvula midline.  Eyes:     General: Lids are normal. Lids are everted, no foreign bodies appreciated.     Conjunctiva/sclera: Conjunctivae normal.     Pupils: Pupils are equal, round, and reactive to light.  Neck:     Thyroid : No thyroid  mass or thyromegaly.     Vascular: No carotid bruit.     Trachea: Trachea normal.  Cardiovascular:     Rate and Rhythm: Normal rate and regular rhythm.     Pulses: Normal pulses.     Heart sounds: Normal heart sounds, S1 normal and S2 normal. No murmur heard.    No friction rub. No gallop.  Pulmonary:     Effort: Pulmonary effort is normal. No tachypnea or respiratory distress.     Breath sounds: Normal breath sounds. No decreased breath sounds, wheezing, rhonchi or rales.  Abdominal:     General: Bowel sounds are normal.     Palpations: Abdomen is soft.     Tenderness: There is no abdominal tenderness.  Musculoskeletal:     Cervical back: Normal range of motion and neck supple.     Left lower leg: Edema present.  Skin:    General: Skin is warm and dry.     Findings: No rash.     Comments:  Interval improvement in ulcers on left lower leg.. granulaiton tissue, no weeping, stable associated erythema.  Slight decrease in depth of lesion.  Neurological:     Mental Status: She is alert.  Psychiatric:        Mood and Affect: Mood is not anxious or depressed.        Speech: Speech normal.        Behavior: Behavior normal. Behavior is cooperative.        Thought Content: Thought content normal.        Judgment: Judgment normal.             Results  for orders placed or performed in visit on 02/06/24  POCT INR   Collection Time: 02/05/24 12:00 AM  Result Value Ref Range   INR 3.9 (A) 2.0 - 3.0    Assessment and Plan  Chronic venous insufficiency  Ulcer of left lower extremity, limited to breakdown of skin (HCC) Assessment & Plan: Chronic, posterior calf ulcer with decrease  in size and good granulation tissue. Anterior ulcer #2 is decreasing in size. Anterior ulcer #1 appears to be stable but does have some granulation tissue. New anterior ulcer #3: More significant lower extremity swelling than previous. She is wearing compression hose during the day and elevating once daily I have asked her to increase this to twice daily and to elevate her leg at night. After review of past ulcer treatment at the wound care center I have suggested triple compression therapy to try to help with swelling and to promote healing.  She will continue her Santyl  and Hydrofera Blue wound care changes 3 times a week Again discussed possibility of referring to wound care center but she continues to not want to do this.  She is also not wanting to do an Radio broadcast assistant given it was extremely uncomfortable previously.  Close follow-up in approximately 2 weeks for reevaluation.      Return in about 2 weeks (around 03/04/2024) for  follow up wound... can be after I retiurn from vacation.   Herby Lolling, MD

## 2024-02-20 ENCOUNTER — Ambulatory Visit (INDEPENDENT_AMBULATORY_CARE_PROVIDER_SITE_OTHER): Payer: Self-pay

## 2024-02-20 DIAGNOSIS — Z7901 Long term (current) use of anticoagulants: Secondary | ICD-10-CM | POA: Diagnosis not present

## 2024-02-20 LAB — POCT INR: INR: 2.3 (ref 2.0–3.0)

## 2024-02-20 NOTE — Patient Instructions (Addendum)
 Pre visit review using our clinic review tool, if applicable. No additional management support is needed unless otherwise documented below in the visit note.  Continue 1 tablet daily except take 1/2 tablet on Wednesdays and Sundays. Recheck in 3 weeks, on 6/26.

## 2024-02-20 NOTE — Progress Notes (Signed)
 Pt lives at [facility] and has lab draw on Thursday PRN for INR. Result was not received until this morning. INR is 2.3 and PTT is 22.9 Continue 1 tablet daily except take 1/2 tablet on Wednesdays and Sundays. Recheck in 3 weeks, on 6/26. Pt is now residing in [facility] in [place] and the nurse is Willa Haring. Receive results via email. Sent dosing instructions and next testing date via email.

## 2024-02-24 DIAGNOSIS — R278 Other lack of coordination: Secondary | ICD-10-CM | POA: Diagnosis not present

## 2024-02-24 DIAGNOSIS — R2689 Other abnormalities of gait and mobility: Secondary | ICD-10-CM | POA: Diagnosis not present

## 2024-02-24 DIAGNOSIS — M159 Polyosteoarthritis, unspecified: Secondary | ICD-10-CM | POA: Diagnosis not present

## 2024-02-24 DIAGNOSIS — M6281 Muscle weakness (generalized): Secondary | ICD-10-CM | POA: Diagnosis not present

## 2024-02-24 DIAGNOSIS — J432 Centrilobular emphysema: Secondary | ICD-10-CM | POA: Diagnosis not present

## 2024-02-24 DIAGNOSIS — I872 Venous insufficiency (chronic) (peripheral): Secondary | ICD-10-CM | POA: Diagnosis not present

## 2024-02-24 DIAGNOSIS — I480 Paroxysmal atrial fibrillation: Secondary | ICD-10-CM | POA: Diagnosis not present

## 2024-02-24 DIAGNOSIS — G629 Polyneuropathy, unspecified: Secondary | ICD-10-CM | POA: Diagnosis not present

## 2024-02-24 DIAGNOSIS — Z741 Need for assistance with personal care: Secondary | ICD-10-CM | POA: Diagnosis not present

## 2024-02-24 DIAGNOSIS — I4891 Unspecified atrial fibrillation: Secondary | ICD-10-CM | POA: Diagnosis not present

## 2024-02-24 DIAGNOSIS — R4189 Other symptoms and signs involving cognitive functions and awareness: Secondary | ICD-10-CM | POA: Diagnosis not present

## 2024-03-07 ENCOUNTER — Other Ambulatory Visit: Payer: Self-pay | Admitting: Family Medicine

## 2024-03-10 ENCOUNTER — Ambulatory Visit (INDEPENDENT_AMBULATORY_CARE_PROVIDER_SITE_OTHER): Admitting: Family Medicine

## 2024-03-10 VITALS — BP 102/62 | HR 77 | Temp 98.0°F | Ht 67.75 in | Wt 133.0 lb

## 2024-03-10 DIAGNOSIS — I872 Venous insufficiency (chronic) (peripheral): Secondary | ICD-10-CM

## 2024-03-10 DIAGNOSIS — L97921 Non-pressure chronic ulcer of unspecified part of left lower leg limited to breakdown of skin: Secondary | ICD-10-CM | POA: Diagnosis not present

## 2024-03-10 NOTE — Progress Notes (Signed)
 It is still listed as shift and in transit.  It shipped from [place] at 5:40 PM last night   Patient ID: [name], female    DOB: 1932-05-04, 89 y.o.   MRN: [id number]  This visit was conducted in person.  BP 102/62   Pulse 77   Temp 98 F (36.7 C) (Oral)   Ht 5' 7.75 (1.721 m)   Wt 133 lb (60.3 kg)   SpO2 96%   BMI 20.37 kg/m    CC:  Chief Complaint  Patient presents with   Follow-up    Leg ulcer. Patient says its much better    Subjective:   HPI: [name] is a 89 y.o. female presenting on 03/10/2024 for Follow-up (Leg ulcer. Patient says its much better)  2 venous ulcers left lower leg in the setting of bilateral chronic venous insufficiency.   Today she reports   She has had M,W, F wound changes with Maude Gaskins, RN. With santyl  and hydrofera blue. No fever.  Swelling improved... has been elevating.  No pain. No redness spreading. Minimal discharge.    Wearing compression hose when up feet. trying to elevate...  she has moved to assisted living. Helping her Maude Gaskins, RN clinical coordinator for [employer] at [facility].      Relevant past medical, surgical, family and social history reviewed and updated as indicated. Interim medical history since our last visit reviewed. Allergies and medications reviewed and updated. Outpatient Medications Prior to Visit  Medication Sig Dispense Refill   collagenase  (SANTYL ) 250 UNIT/GM ointment Apply 1 Application topically daily. 15 g 0   Compression Bandages KIT Triple layer compression bandage. Apply  M, W, F 1 kit 3   fluticasone -salmeterol (WIXELA INHUB ) 250-50 MCG/ACT AEPB INHALE 1 PUFF INTO THE LUNGS IN THE MORNING AND AT BEDTIME. 180 each 0   gabapentin  (NEURONTIN ) 100 MG capsule Take 2 capsules (200 mg total) by mouth at bedtime. 180 capsule 1   ipratropium (ATROVENT ) 0.03 % nasal spray PLACE 2 SPRAYS INTO BOTH NOSTRILS 3 (THREE) TIMES DAILY 90 mL 0   levothyroxine  (SYNTHROID ) 75 MCG tablet TAKE 1 TABLET  BY MOUTH 30 MINS BEFORE EATING WITH A FULL GLASS OF WATER 90 tablet 3   verapamil  (CALAN -SR) 120 MG CR tablet TAKE 1 TABLET BY MOUTH EVERY DAY 90 tablet 3   warfarin (COUMADIN ) 5 MG tablet TAKE 1 TABLET BY MOUTH DAILY EXCEPT TAKE 1/2 TABLET ON SUNDAYS AND THURSDAYS OR AS DIRECTED BY ANTICOAGULATION CLINIC 95 tablet 1   Wound Dressings (HYDROFERA BLUE 6X6) PADS Apply to both ulcers, daily to every other day. 10 each 0   No facility-administered medications prior to visit.     Per HPI unless specifically indicated in ROS section below Review of Systems  Constitutional:  Negative for fatigue and fever.  HENT:  Negative for congestion.   Eyes:  Negative for pain.  Respiratory:  Negative for cough and shortness of breath.   Cardiovascular:  Positive for leg swelling. Negative for chest pain and palpitations.  Gastrointestinal:  Negative for abdominal pain.  Genitourinary:  Negative for dysuria and vaginal bleeding.  Musculoskeletal:  Negative for back pain.  Neurological:  Negative for syncope, light-headedness and headaches.  Psychiatric/Behavioral:  Negative for dysphoric mood.    Objective:  BP 102/62   Pulse 77   Temp 98 F (36.7 C) (Oral)   Ht 5' 7.75 (1.721 m)   Wt 133 lb (60.3 kg)   SpO2 96%   BMI  20.37 kg/m   Wt Readings from Last 3 Encounters:  03/10/24 133 lb (60.3 kg)  02/19/24 132 lb 4 oz (60 kg)  02/05/24 132 lb 12.8 oz (60.2 kg)      Physical Exam Constitutional:      General: She is not in acute distress.    Appearance: Normal appearance. She is well-developed. She is not ill-appearing or toxic-appearing.  HENT:     Head: Normocephalic.     Right Ear: Hearing, tympanic membrane, ear canal and external ear normal. Tympanic membrane is not erythematous, retracted or bulging.     Left Ear: Hearing, tympanic membrane, ear canal and external ear normal. Tympanic membrane is not erythematous, retracted or bulging.     Nose: No mucosal edema or rhinorrhea.      Right Sinus: No maxillary sinus tenderness or frontal sinus tenderness.     Left Sinus: No maxillary sinus tenderness or frontal sinus tenderness.     Mouth/Throat:     Pharynx: Uvula midline.   Eyes:     General: Lids are normal. Lids are everted, no foreign bodies appreciated.     Conjunctiva/sclera: Conjunctivae normal.     Pupils: Pupils are equal, round, and reactive to light.   Neck:     Thyroid : No thyroid  mass or thyromegaly.     Vascular: No carotid bruit.     Trachea: Trachea normal.   Cardiovascular:     Rate and Rhythm: Normal rate and regular rhythm.     Pulses: Normal pulses.     Heart sounds: Normal heart sounds, S1 normal and S2 normal. No murmur heard.    No friction rub. No gallop.  Pulmonary:     Effort: Pulmonary effort is normal. No tachypnea or respiratory distress.     Breath sounds: Normal breath sounds. No decreased breath sounds, wheezing, rhonchi or rales.  Abdominal:     General: Bowel sounds are normal.     Palpations: Abdomen is soft.     Tenderness: There is no abdominal tenderness.   Musculoskeletal:     Cervical back: Normal range of motion and neck supple.     Left lower leg: No edema.   Skin:    General: Skin is warm and dry.     Findings: No rash.     Comments: Significant interval improvement in ulcers on left lower leg..  only 2 small areas remain open with granulation tissue, no weeping, stable associated erythema.  Scab on majority of wounds    Neurological:     Mental Status: She is alert.   Psychiatric:        Mood and Affect: Mood is not anxious or depressed.        Speech: Speech normal.        Behavior: Behavior normal. Behavior is cooperative.        Thought Content: Thought content normal.        Judgment: Judgment normal.             Results for orders placed or performed in visit on 02/20/24  POCT INR   Collection Time: 02/20/24 12:00 AM  Result Value Ref Range   INR 2.3 2.0 - 3.0    Assessment and  Plan  Chronic venous insufficiency  Ulcer of left lower extremity, limited to breakdown of skin (HCC) Assessment & Plan:  Chronic, insignificant improvement since starting triple compression.  Continue this for 1 more week  along with santyl  and Hydrofera blue on open area.  Follow  up in 2 weeks.       No follow-ups on file.   Greig Ring, MD

## 2024-03-10 NOTE — Assessment & Plan Note (Signed)
 Chronic, insignificant improvement since starting triple compression.  Continue this for 1 more week  along with santyl  and Hydrofera blue on open area.  Follow up in 2 weeks.

## 2024-03-11 ENCOUNTER — Ambulatory Visit (INDEPENDENT_AMBULATORY_CARE_PROVIDER_SITE_OTHER): Payer: Self-pay

## 2024-03-11 DIAGNOSIS — Z7901 Long term (current) use of anticoagulants: Secondary | ICD-10-CM | POA: Diagnosis not present

## 2024-03-11 DIAGNOSIS — I4891 Unspecified atrial fibrillation: Secondary | ICD-10-CM | POA: Diagnosis not present

## 2024-03-11 LAB — POCT INR: INR: 1.9 — AB (ref 2.0–3.0)

## 2024-03-11 NOTE — Progress Notes (Signed)
 Pt is now residing in [facility] in [place] and the nurse is Maude Gaskins ([email or web address]). Receive results via email. He emails INR lab result and dosing instructions are emailed back to him. Unable to contact pt any longer because her phone is not working. Increase dose today to take 7.5 mg and then continue 5 mg daily except take 2.5 mg on Wednesdays and Sundays. Recheck in 3 weeks, on 7/17. Pt is now residing in [facility] in [place] and the nurse is Maude Gaskins ([email or web address]).  Sent dosing instructions and next testing date via email.

## 2024-03-11 NOTE — Patient Instructions (Addendum)
 Pre visit review using our clinic review tool, if applicable. No additional management support is needed unless otherwise documented below in the visit note.  Increase dose today to take 7.5 mg and then continue 5 mg daily except take 2.5 mg on Wednesdays and Sundays. Recheck in 3 weeks, on 7/17.

## 2024-03-16 DIAGNOSIS — M159 Polyosteoarthritis, unspecified: Secondary | ICD-10-CM | POA: Diagnosis not present

## 2024-03-16 DIAGNOSIS — I872 Venous insufficiency (chronic) (peripheral): Secondary | ICD-10-CM | POA: Diagnosis not present

## 2024-03-16 DIAGNOSIS — M6281 Muscle weakness (generalized): Secondary | ICD-10-CM | POA: Diagnosis not present

## 2024-03-16 DIAGNOSIS — R278 Other lack of coordination: Secondary | ICD-10-CM | POA: Diagnosis not present

## 2024-03-16 DIAGNOSIS — G629 Polyneuropathy, unspecified: Secondary | ICD-10-CM | POA: Diagnosis not present

## 2024-03-16 DIAGNOSIS — J432 Centrilobular emphysema: Secondary | ICD-10-CM | POA: Diagnosis not present

## 2024-03-16 DIAGNOSIS — I480 Paroxysmal atrial fibrillation: Secondary | ICD-10-CM | POA: Diagnosis not present

## 2024-03-16 DIAGNOSIS — Z741 Need for assistance with personal care: Secondary | ICD-10-CM | POA: Diagnosis not present

## 2024-03-16 DIAGNOSIS — R4189 Other symptoms and signs involving cognitive functions and awareness: Secondary | ICD-10-CM | POA: Diagnosis not present

## 2024-03-16 DIAGNOSIS — R2689 Other abnormalities of gait and mobility: Secondary | ICD-10-CM | POA: Diagnosis not present

## 2024-03-16 DIAGNOSIS — I4891 Unspecified atrial fibrillation: Secondary | ICD-10-CM | POA: Diagnosis not present

## 2024-03-23 ENCOUNTER — Ambulatory Visit (INDEPENDENT_AMBULATORY_CARE_PROVIDER_SITE_OTHER): Admitting: Family Medicine

## 2024-03-23 ENCOUNTER — Encounter: Payer: Self-pay | Admitting: Family Medicine

## 2024-03-23 VITALS — BP 100/74 | HR 78 | Temp 98.2°F | Ht 67.75 in | Wt 135.2 lb

## 2024-03-23 DIAGNOSIS — L97921 Non-pressure chronic ulcer of unspecified part of left lower leg limited to breakdown of skin: Secondary | ICD-10-CM

## 2024-03-23 NOTE — Progress Notes (Signed)
 It is still listed as shift and in transit.  It shipped from [place] at 5:40 PM last night   Patient ID: [name], female    DOB: 01-Sep-1932, 89 y.o.   MRN: [id number]  This visit was conducted in person.  BP 100/74   Pulse 78   Temp 98.2 F (36.8 C) (Temporal)   Ht 5' 7.75 (1.721 m)   Wt 135 lb 4 oz (61.3 kg)   SpO2 93%   BMI 20.72 kg/m    CC:  Chief Complaint  Patient presents with   Skin Ulcer    LL Extremity follow up    Subjective:   HPI: [name] is a 89 y.o. female presenting on 03/23/2024 for Skin Ulcer (LL Extremity follow up)   Only 1 remaining venous ulcers left lower leg in the setting of bilateral chronic venous insufficiency.   Today she reports   She has had M,W, F wound changes with Maude Gaskins, RN. With santyl  and hydrofera blue. Now just on anterior wound. No fever.  Swelling improved... has been elevating.  No pain. No redness spreading. Minimal discharge.    Wearing compression hose when up feet. trying to elevate...   She plans to change MDs to assisted living associated MD to avoid trips to our office.  Relevant past medical, surgical, family and social history reviewed and updated as indicated. Interim medical history since our last visit reviewed. Allergies and medications reviewed and updated. Outpatient Medications Prior to Visit  Medication Sig Dispense Refill   collagenase  (SANTYL ) 250 UNIT/GM ointment Apply 1 Application topically daily. 15 g 0   Compression Bandages KIT Triple layer compression bandage. Apply  M, W, F 1 kit 3   fluticasone -salmeterol (WIXELA INHUB ) 250-50 MCG/ACT AEPB INHALE 1 PUFF INTO THE LUNGS IN THE MORNING AND AT BEDTIME. 180 each 0   gabapentin  (NEURONTIN ) 100 MG capsule Take 2 capsules (200 mg total) by mouth at bedtime. 180 capsule 1   ipratropium (ATROVENT ) 0.03 % nasal spray PLACE 2 SPRAYS INTO BOTH NOSTRILS 3 (THREE) TIMES DAILY 90 mL 0   levothyroxine  (SYNTHROID ) 75 MCG tablet TAKE 1 TABLET BY MOUTH  30 MINS BEFORE EATING WITH A FULL GLASS OF WATER 90 tablet 3   verapamil  (CALAN -SR) 120 MG CR tablet TAKE 1 TABLET BY MOUTH EVERY DAY 90 tablet 3   warfarin (COUMADIN ) 5 MG tablet TAKE 1 TABLET BY MOUTH DAILY EXCEPT TAKE 1/2 TABLET ON SUNDAYS AND THURSDAYS OR AS DIRECTED BY ANTICOAGULATION CLINIC 95 tablet 1   Wound Dressings (HYDROFERA BLUE 6X6) PADS Apply to both ulcers, daily to every other day. 10 each 0   No facility-administered medications prior to visit.     Per HPI unless specifically indicated in ROS section below Review of Systems  Constitutional:  Negative for fatigue and fever.  HENT:  Negative for congestion.   Eyes:  Negative for pain.  Respiratory:  Negative for cough and shortness of breath.   Cardiovascular:  Positive for leg swelling. Negative for chest pain and palpitations.  Gastrointestinal:  Negative for abdominal pain.  Genitourinary:  Negative for dysuria and vaginal bleeding.  Musculoskeletal:  Negative for back pain.  Neurological:  Negative for syncope, light-headedness and headaches.  Psychiatric/Behavioral:  Negative for dysphoric mood.    Objective:  BP 100/74   Pulse 78   Temp 98.2 F (36.8 C) (Temporal)   Ht 5' 7.75 (1.721 m)   Wt 135 lb 4 oz (61.3 kg)   SpO2 93%  BMI 20.72 kg/m   Wt Readings from Last 3 Encounters:  03/23/24 135 lb 4 oz (61.3 kg)  03/10/24 133 lb (60.3 kg)  02/19/24 132 lb 4 oz (60 kg)      Physical Exam Constitutional:      General: She is not in acute distress.    Appearance: Normal appearance. She is well-developed. She is not ill-appearing or toxic-appearing.  HENT:     Head: Normocephalic.     Right Ear: Hearing, tympanic membrane, ear canal and external ear normal. Tympanic membrane is not erythematous, retracted or bulging.     Left Ear: Hearing, tympanic membrane, ear canal and external ear normal. Tympanic membrane is not erythematous, retracted or bulging.     Nose: No mucosal edema or rhinorrhea.     Right  Sinus: No maxillary sinus tenderness or frontal sinus tenderness.     Left Sinus: No maxillary sinus tenderness or frontal sinus tenderness.     Mouth/Throat:     Pharynx: Uvula midline.  Eyes:     General: Lids are normal. Lids are everted, no foreign bodies appreciated.     Conjunctiva/sclera: Conjunctivae normal.     Pupils: Pupils are equal, round, and reactive to light.  Neck:     Thyroid : No thyroid  mass or thyromegaly.     Vascular: No carotid bruit.     Trachea: Trachea normal.  Cardiovascular:     Rate and Rhythm: Normal rate and regular rhythm.     Pulses: Normal pulses.     Heart sounds: Normal heart sounds, S1 normal and S2 normal. No murmur heard.    No friction rub. No gallop.  Pulmonary:     Effort: Pulmonary effort is normal. No tachypnea or respiratory distress.     Breath sounds: Normal breath sounds. No decreased breath sounds, wheezing, rhonchi or rales.  Abdominal:     General: Bowel sounds are normal.     Palpations: Abdomen is soft.     Tenderness: There is no abdominal tenderness.  Musculoskeletal:     Cervical back: Normal range of motion and neck supple.     Left lower leg: No edema.  Skin:    General: Skin is warm and dry.     Findings: No rash.     Comments: Significant interval improvement in ulcers on left lower leg..  only 1 small area 0.5 cm remain open with granulation tissue, no weeping, stable associated erythema.  Scab on all other wounds   Neurological:     Mental Status: She is alert.  Psychiatric:        Mood and Affect: Mood is not anxious or depressed.        Speech: Speech normal.        Behavior: Behavior normal. Behavior is cooperative.        Thought Content: Thought content normal.        Judgment: Judgment normal.             Results for orders placed or performed in visit on 03/11/24  POCT INR   Collection Time: 03/11/24 12:00 AM  Result Value Ref Range   INR 1.9 (A) 2.0 - 3.0    Assessment and Plan  Ulcer of  left lower extremity, limited to breakdown of skin (HCC) Assessment & Plan: Chronic, only small half centimeter shallow ulcer with granular Tatian tissue remaining in left anterior lower leg. She will continue to elevate and wear compression hose but will change dressing to antibiotic ointment and  nonstick absorbable dressing.  She will wash the area daily.        No follow-ups on file.   Greig Ring, MD

## 2024-03-23 NOTE — Assessment & Plan Note (Signed)
 Chronic, only small half centimeter shallow ulcer with granular Tatian tissue remaining in left anterior lower leg. She will continue to elevate and wear compression hose but will change dressing to antibiotic ointment and nonstick absorbable dressing.  She will wash the area daily.

## 2024-04-05 ENCOUNTER — Ambulatory Visit (INDEPENDENT_AMBULATORY_CARE_PROVIDER_SITE_OTHER): Payer: Self-pay

## 2024-04-05 DIAGNOSIS — Z7901 Long term (current) use of anticoagulants: Secondary | ICD-10-CM

## 2024-04-05 DIAGNOSIS — I4891 Unspecified atrial fibrillation: Secondary | ICD-10-CM | POA: Diagnosis not present

## 2024-04-05 LAB — POCT INR: INR: 1.5 — AB (ref 2.0–3.0)

## 2024-04-05 NOTE — Patient Instructions (Signed)
Pre visit review using our clinic review tool, if applicable. No additional management support is needed unless otherwise documented below in the visit note. 

## 2024-04-05 NOTE — Progress Notes (Signed)
 Pt is now residing in [facility] in [place] and the nurse is Maude Gaskins ([email or web address]). Receive results via email. INR today is 1.5 and PTT is 16.1. He emails INR lab result and dosing instructions are emailed back to him. Unable to contact pt any longer because her phone is not working. Increase dose today to take 7.5 mg and increase dose tomorrow to take 7.5 mg and then change weekly dose to take 5 mg daily except take 2.5 mg on Wednesdays. Recheck in 32 weeks, on 8/7. Pt is now residing in [facility] in [place] and the nurse is Maude Gaskins ([email or web address]).  Sent dosing instructions and next testing date via email.

## 2024-04-19 ENCOUNTER — Non-Acute Institutional Stay: Payer: Self-pay | Admitting: Student

## 2024-04-19 ENCOUNTER — Encounter: Payer: Self-pay | Admitting: Student

## 2024-04-19 DIAGNOSIS — J432 Centrilobular emphysema: Secondary | ICD-10-CM

## 2024-04-19 DIAGNOSIS — Z66 Do not resuscitate: Secondary | ICD-10-CM | POA: Diagnosis not present

## 2024-04-19 DIAGNOSIS — I4811 Longstanding persistent atrial fibrillation: Secondary | ICD-10-CM

## 2024-04-19 DIAGNOSIS — G629 Polyneuropathy, unspecified: Secondary | ICD-10-CM | POA: Diagnosis not present

## 2024-04-19 DIAGNOSIS — E039 Hypothyroidism, unspecified: Secondary | ICD-10-CM

## 2024-04-19 DIAGNOSIS — L97921 Non-pressure chronic ulcer of unspecified part of left lower leg limited to breakdown of skin: Secondary | ICD-10-CM | POA: Diagnosis not present

## 2024-04-19 DIAGNOSIS — I872 Venous insufficiency (chronic) (peripheral): Secondary | ICD-10-CM

## 2024-04-19 DIAGNOSIS — R296 Repeated falls: Secondary | ICD-10-CM

## 2024-04-19 NOTE — Progress Notes (Unsigned)
 Location:  Other [facility]) Nursing Home Room Number: 314 S Place of Service:  ALF [phone]) Provider:  Richerd MYRTIS Brigham, M.D.   Patient Care Team: Avelina Greig BRAVO, MD as PCP - General (Family Medicine) Isenstein, Arin L, MD (Dermatology) Milissa Hamming, MD as Referring Physician (Otolaryngology) Lenn Standing, MD as Consulting Physician (Ophthalmology) Perla Evalene PARAS, MD as Consulting Physician (Cardiology) Jeri Donnice Burkes, MD as Referring Physician (Gastroenterology) Isenstein, Arin L, MD (Dermatology)  Extended Emergency Contact Information Primary Emergency Contact: Lanius,Kevin  [place] Mobile Phone: [phone] Relation: Son Secondary Emergency Contact: Cutillo,Andrea Mobile Phone: [phone] Relation: Relative  Code Status:  DNR Goals of care: Advanced Directive information    12/09/2023   11:57 AM  Advanced Directives  Does Patient Have a Medical Advance Directive? Yes  Type of Estate agent of [place];Living will  Copy of Healthcare Power of Attorney in Chart? Yes - validated most recent copy scanned in chart (See row information)     Chief Complaint  Patient presents with   Medical Management of Chronic Issues    Routine visit. Discuss need for TD/Tdap and DEXA     HPI:  Pt is a 89 y.o. female seen today for medical management of chronic diseases.     Past Medical History:  Diagnosis Date   Atrial fibrillation (HCC)    COPD (chronic obstructive pulmonary disease) (HCC)    Hypothyroidism    Microscopic colitis    Osteoporosis    Squamous cell carcinoma    Syncope    Past Surgical History:  Procedure Laterality Date   APPENDECTOMY     CATARACT EXTRACTION Bilateral    COLONOSCOPY     LIPOMA EXCISION     MULTIPLE TOOTH EXTRACTIONS     REFRACTIVE SURGERY     SQUAMOUS CELL CARCINOMA EXCISION  07/10/2017   left shin    Allergies  Allergen Reactions   Sulfa Antibiotics Nausea And Vomiting     Outpatient Encounter Medications as of 04/19/2024  Medication Sig   acetaminophen (TYLENOL) 325 MG tablet Take 650 mg by mouth every 4 (four) hours as needed.   alum & mag hydroxide-simeth (MAALOX/MYLANTA) 200-200-20 MG/5ML suspension Give 2 Tbsp by mouth every 4 hours as needed for gas, indigestion, or upset stomach supervised self-administration Notify MD if no relief   bismuth subsalicylate (PEPTO BISMOL) 262 MG/15ML suspension Give 10 cc by mouth as needed for loose stool supervised self-administration   carbamide peroxide (DEBROX) 6.5 % OTIC solution Place 5 drops into both ears as needed.   cetirizine (ZYRTEC) 5 MG tablet Take 5 mg by mouth as needed for allergies.   dextromethorphan-guaiFENesin (ROBITUSSIN-DM) 10-100 MG/5ML liquid Give 2 tsp by mouth every 4 hours as needed for coughing. supervised self-administration Notify MD if no relief in 3 days or continued use   fluticasone -salmeterol (WIXELA INHUB ) 250-50 MCG/ACT AEPB INHALE 1 PUFF INTO THE LUNGS IN THE MORNING AND AT BEDTIME.   gabapentin  (NEURONTIN ) 100 MG capsule Take 2 capsules (200 mg total) by mouth at bedtime.   Glucose 15 GM/32ML GEL Take 1 packet by mouth as needed (for low blood sugar).   ipratropium (ATROVENT ) 0.03 % nasal spray PLACE 2 SPRAYS INTO BOTH NOSTRILS 3 (THREE) TIMES DAILY   levothyroxine  (SYNTHROID ) 75 MCG tablet TAKE 1 TABLET BY MOUTH 30 MINS BEFORE EATING WITH A FULL GLASS OF WATER   magnesium hydroxide (MILK OF MAGNESIA) 400 MG/5ML suspension Give 2 Tbsp by mouth as needed for constipation. supervised self-administration daily --  Call MD if no relief in 3 days of continued use.   nystatin (MYCOSTATIN/NYSTOP) powder Apply to excoriated area topically as needed for skin breakdown supervised selfadministration twice daily   ondansetron (ZOFRAN) 4 MG tablet Take 4 mg by mouth 3 (three) times daily as needed for nausea or vomiting.   OXYGEN  Inhale 2 L into the lungs. may start for dyspnea or SOB    verapamil  (CALAN -SR) 120 MG CR tablet TAKE 1 TABLET BY MOUTH EVERY DAY   warfarin (COUMADIN ) 5 MG tablet Give 0.5 tablet by mouth one time a day every Wed related to UNSPECIFIED ATRIAL FIBRILLATION (I48.91) unsupervised self-administration  Give 1 tablet by mouth one time a day every Mon, Tue, Thu, Fri, Sat, Sun related to UNSPECIFIED ATRIAL FIBRILLATION (I48.91) unsupervised selfadministration   collagenase  (SANTYL ) 250 UNIT/GM ointment Apply 1 Application topically daily. (Patient not taking: Reported on 04/19/2024)   Compression Bandages KIT Triple layer compression bandage. Apply  M, W, F (Patient not taking: Reported on 04/19/2024)   warfarin (COUMADIN ) 5 MG tablet TAKE 1 TABLET BY MOUTH DAILY EXCEPT TAKE 1/2 TABLET ON SUNDAYS AND THURSDAYS OR AS DIRECTED BY ANTICOAGULATION CLINIC (Patient not taking: Reported on 04/19/2024)   Wound Dressings (HYDROFERA BLUE 6X6) PADS Apply to both ulcers, daily to every other day. (Patient not taking: Reported on 04/19/2024)   No facility-administered encounter medications on file as of 04/19/2024.    Review of Systems  Immunization History  Administered Date(s) Administered   DTaP 04/30/2004   Influenza, High Dose Seasonal PF 06/17/2014   Influenza,inj,Quad PF,6+ Mos 06/22/2013   Influenza-Unspecified 06/26/2015, 06/25/2017, 06/24/2019, 06/16/2020, 06/21/2021, 07/02/2022, 07/18/2023   Moderna Covid-19 Vaccine Bivalent Booster 68yrs & up 02/12/2022   Moderna Sars-Covid-2 Vaccination 10/07/2019, 10/28/2019, 07/28/2020   Pfizer Covid-19 Vaccine Bivalent Booster 44yrs & up 06/07/2021   Pfizer(Comirnaty)Fall Seasonal Vaccine 12 years and older 10/08/2023   Pneumococcal Conjugate-13 09/29/2014   Pneumococcal Polysaccharide-23 06/01/2012   Respiratory Syncytial Virus Vaccine,Recomb Aduvanted(Arexvy) 09/30/2022   Varicella 08/06/2007   Zoster Recombinant(Shingrix) 01/17/2022, 03/18/2022   Pertinent  Health Maintenance Due  Topic Date Due   DEXA SCAN   06/02/2017   INFLUENZA VACCINE  05/17/2024 (Originally 04/16/2024)      12/09/2023   11:45 AM 01/14/2024   10:42 AM 01/14/2024   10:50 AM 03/10/2024   12:04 PM 03/10/2024   12:17 PM  Fall Risk  Falls in the past year? 1 1 0 0 1  Was there an injury with Fall? 1 1 0 0 1  Fall Risk Category Calculator 2 2 0 0 2  Patient at Risk for Falls Due to Impaired balance/gait;Impaired vision;Impaired mobility No Fall Risks No Fall Risks No Fall Risks History of fall(s)  Fall risk Follow up Education provided;Falls prevention discussed Falls evaluation completed Falls evaluation completed     Functional Status Survey:    Vitals:   04/19/24 1124  BP: 98/68  Pulse: 85  Weight: 131 lb 8 oz (59.6 kg)  Height: 5' 7.75 (1.721 m)   Body mass index is 20.14 kg/m. Physical Exam  Labs reviewed: Recent Labs    12/23/23 0852  NA 136  K 3.9  CL 100  CO2 27  GLUCOSE 94  BUN 16  CREATININE 0.68  CALCIUM 10.2   Recent Labs    12/23/23 0852  AST 27  ALT 23  ALKPHOS 87  BILITOT 0.8  PROT 7.1  ALBUMIN 4.4   No results for input(s): WBC, NEUTROABS, HGB, HCT, MCV, PLT in the last  8760 hours. Lab Results  Component Value Date   TSH 2.04 12/23/2023   Lab Results  Component Value Date   HGBA1C 6.0 09/19/2014   Lab Results  Component Value Date   CHOL 210 (H) 12/23/2023   HDL 78.60 12/23/2023   LDLCALC 109 (H) 12/23/2023   LDLDIRECT 126.5 07/08/2013   TRIG 114.0 12/23/2023   CHOLHDL 3 12/23/2023    Significant Diagnostic Results in last 30 days:  No results found.  Assessment/Plan There are no diagnoses linked to this encounter.   Family/ staff Communication: ***  Labs/tests ordered:  ***

## 2024-04-20 ENCOUNTER — Encounter: Payer: Self-pay | Admitting: Student

## 2024-04-20 ENCOUNTER — Telehealth: Payer: Self-pay

## 2024-04-20 DIAGNOSIS — G629 Polyneuropathy, unspecified: Secondary | ICD-10-CM | POA: Diagnosis not present

## 2024-04-20 DIAGNOSIS — I4891 Unspecified atrial fibrillation: Secondary | ICD-10-CM | POA: Diagnosis not present

## 2024-04-20 DIAGNOSIS — R2689 Other abnormalities of gait and mobility: Secondary | ICD-10-CM | POA: Diagnosis not present

## 2024-04-20 DIAGNOSIS — I872 Venous insufficiency (chronic) (peripheral): Secondary | ICD-10-CM | POA: Diagnosis not present

## 2024-04-20 DIAGNOSIS — M6281 Muscle weakness (generalized): Secondary | ICD-10-CM | POA: Diagnosis not present

## 2024-04-20 DIAGNOSIS — R4189 Other symptoms and signs involving cognitive functions and awareness: Secondary | ICD-10-CM | POA: Diagnosis not present

## 2024-04-20 DIAGNOSIS — J432 Centrilobular emphysema: Secondary | ICD-10-CM | POA: Diagnosis not present

## 2024-04-20 DIAGNOSIS — M159 Polyosteoarthritis, unspecified: Secondary | ICD-10-CM | POA: Diagnosis not present

## 2024-04-20 DIAGNOSIS — I480 Paroxysmal atrial fibrillation: Secondary | ICD-10-CM | POA: Diagnosis not present

## 2024-04-20 DIAGNOSIS — Z741 Need for assistance with personal care: Secondary | ICD-10-CM | POA: Diagnosis not present

## 2024-04-20 DIAGNOSIS — R278 Other lack of coordination: Secondary | ICD-10-CM | POA: Diagnosis not present

## 2024-04-20 MED ORDER — APIXABAN 5 MG PO TABS: 5.0000 mg | ORAL_TABLET | Freq: Two times a day (BID) | ORAL | 3 refills | Status: AC

## 2024-04-20 NOTE — Telephone Encounter (Signed)
 Noted

## 2024-04-20 NOTE — Telephone Encounter (Signed)
 Received email from Maude Gaskins, RN, in charge of assisted living in [place]. He reported pt is changing PCP to the physician there, Dr. Richerd Brigham. Dr. Brigham is considering transitioning pt to Eliquis . Sent email and advised Maude that [facility] coumadin  clinic cannot continue to dose warfarin if pt is not seeing a [facility] provider. Advised to wish the pt the best.   Advised a msg would be sent to her provider at [facility].

## 2024-04-22 ENCOUNTER — Ambulatory Visit: Payer: Self-pay

## 2024-04-22 DIAGNOSIS — D519 Vitamin B12 deficiency anemia, unspecified: Secondary | ICD-10-CM | POA: Diagnosis not present

## 2024-04-22 DIAGNOSIS — E559 Vitamin D deficiency, unspecified: Secondary | ICD-10-CM | POA: Diagnosis not present

## 2024-04-22 DIAGNOSIS — I4891 Unspecified atrial fibrillation: Secondary | ICD-10-CM | POA: Diagnosis not present

## 2024-04-22 LAB — BASIC METABOLIC PANEL WITH GFR
BUN: 15 (ref 4–21)
CO2: 27 — AB (ref 13–22)
Chloride: 104 (ref 99–108)
Creatinine: 0.7 (ref 0.5–1.1)
Glucose: 79
Potassium: 4.2 meq/L (ref 3.5–5.1)
Sodium: 138 (ref 137–147)

## 2024-04-22 LAB — HEPATIC FUNCTION PANEL
ALT: 16 U/L (ref 7–35)
AST: 20 (ref 13–35)
Alkaline Phosphatase: 78 (ref 25–125)
Bilirubin, Total: 0.8

## 2024-04-22 LAB — CBC AND DIFFERENTIAL
HCT: 41 (ref 36–46)
Hemoglobin: 13.3 (ref 12.0–16.0)
Neutrophils Absolute: 2189
Platelets: 148 K/uL — AB (ref 150–400)
WBC: 4.3

## 2024-04-22 LAB — COMPREHENSIVE METABOLIC PANEL WITH GFR
Albumin: 3.5 (ref 3.5–5.0)
Calcium: 9.5 (ref 8.7–10.7)
Globulin: 2.2
eGFR: 82

## 2024-04-22 LAB — CBC: RBC: 4.3 (ref 3.87–5.11)

## 2024-04-22 LAB — VITAMIN B12: Vitamin B-12: 252

## 2024-04-22 NOTE — Progress Notes (Signed)
 Pt is now residing in [facility] in [place] and the nurse is Maude Gaskins ([email or web address]). Received email from Maude Gaskins, RN, in charge of assisted living in [place]. He reported pt is changing PCP to the physician there, Dr. Richerd Brigham. Dr. Brigham is considering transitioning pt to Eliquis . Sent email and advised Maude that [facility]  clinic cannot continue to dose warfarin if pt is not seeing a [facility] provider. Advised to wish the pt the best. Resolving anticoagulation encounters.

## 2024-08-06 ENCOUNTER — Encounter: Payer: Self-pay | Admitting: Orthopedic Surgery

## 2024-08-06 ENCOUNTER — Non-Acute Institutional Stay: Payer: Self-pay | Admitting: Orthopedic Surgery

## 2024-08-06 DIAGNOSIS — I872 Venous insufficiency (chronic) (peripheral): Secondary | ICD-10-CM | POA: Diagnosis not present

## 2024-08-06 DIAGNOSIS — G629 Polyneuropathy, unspecified: Secondary | ICD-10-CM

## 2024-08-06 DIAGNOSIS — I4811 Longstanding persistent atrial fibrillation: Secondary | ICD-10-CM | POA: Diagnosis not present

## 2024-08-06 DIAGNOSIS — E039 Hypothyroidism, unspecified: Secondary | ICD-10-CM | POA: Diagnosis not present

## 2024-08-06 DIAGNOSIS — J432 Centrilobular emphysema: Secondary | ICD-10-CM

## 2024-08-06 DIAGNOSIS — L819 Disorder of pigmentation, unspecified: Secondary | ICD-10-CM | POA: Diagnosis not present

## 2024-08-06 NOTE — Progress Notes (Signed)
 Location:  Other [facility].  Nursing Home Room Number: [place] [id number] Place of Service:  ALF [phone]) Provider:  Greig Cluster, NP  PCP: Laurence Locus, DO  Patient Care Team: Laurence Locus, DO as PCP - General (Internal Medicine) Chrystie Edmund CROME, MD (Dermatology) Milissa Hamming, MD as Referring Physician (Otolaryngology) Lenn Standing, MD as Consulting Physician (Ophthalmology) Perla Evalene PARAS, MD as Consulting Physician (Cardiology) Jeri Donnice Burkes, MD as Referring Physician (Gastroenterology) Isenstein, Arin L, MD (Dermatology)  Extended Emergency Contact Information Primary Emergency Contact: Caudell,Kevin  [place] Mobile Phone: [phone] Relation: Son Secondary Emergency Contact: Chavira,Andrea Mobile Phone: [phone] Relation: Relative  Code Status:  DNR Goals of care: Advanced Directive information    08/06/2024    7:28 AM  Advanced Directives  Does Patient Have a Medical Advance Directive? Yes  Type of Advance Directive Living will;Out of facility DNR (pink MOST or yellow form)  Does patient want to make changes to medical advance directive? No - Patient declined     Chief Complaint  Patient presents with   Medical Management of Chronic Issues    Medical Management of Chronic Issues.     HPI:  Pt is a 89 y.o. female seen today for medical management of chronic diseases.    She currently resides on the assisted living unit at [facility]. PMH: Chronic venous insufficiency, centrilobular emphysema, hypothyroidism, polyneuropathy, osteopenia, HLD, and incontinence.   Atrial fibrillation- TSH 2.04 12/23/2023, remains on verapamil  for rate control and eliquis  for clot prevention Emphysema- admits to some DOE, not on oxygen , remains on fluticasone -salmeterol  Polyneuropathy- denies increased pain, remains on gabapentin   Chronic venous insufficiency- stable with compression stockings Hypothyroidism- remains on levothyroxine   Recent  weights:  11/15- 135.4 lbs  10/16- 135.8 lbs  09/15- 135.1 lbs  Recent blood pressures:  11/15- 148/87  10/16- 113/69  09/15- 101/69   Past Medical History:  Diagnosis Date   Atrial fibrillation (HCC)    COPD (chronic obstructive pulmonary disease) (HCC)    Hypothyroidism    Microscopic colitis    Osteoporosis    Squamous cell carcinoma    Syncope    Past Surgical History:  Procedure Laterality Date   APPENDECTOMY     CATARACT EXTRACTION Bilateral    COLONOSCOPY     LIPOMA EXCISION     MULTIPLE TOOTH EXTRACTIONS     REFRACTIVE SURGERY     SQUAMOUS CELL CARCINOMA EXCISION  07/10/2017   left shin    Allergies  Allergen Reactions   Sulfa Antibiotics Nausea And Vomiting    Outpatient Encounter Medications as of 08/06/2024  Medication Sig   acetaminophen (TYLENOL) 325 MG tablet Take 650 mg by mouth every 4 (four) hours as needed.   alum & mag hydroxide-simeth (MAALOX/MYLANTA) 200-200-20 MG/5ML suspension Give 2 Tbsp by mouth every 4 hours as needed for gas, indigestion, or upset stomach supervised self-administration Notify MD if no relief   apixaban  (ELIQUIS ) 5 MG TABS tablet Take 1 tablet (5 mg total) by mouth 2 (two) times daily.   bismuth subsalicylate (PEPTO BISMOL) 262 MG/15ML suspension Give 10 cc by mouth as needed for loose stool supervised self-administration   carbamide peroxide (DEBROX) 6.5 % OTIC solution Place 5 drops into both ears as needed.   cetirizine (ZYRTEC) 5 MG tablet Take 5 mg by mouth as needed for allergies.   dextromethorphan-guaiFENesin (ROBITUSSIN-DM) 10-100 MG/5ML liquid Give 2 tsp by mouth every 4 hours as needed for coughing. supervised self-administration Notify MD if no relief  in 3 days or continued use   fluticasone -salmeterol (WIXELA INHUB ) 250-50 MCG/ACT AEPB INHALE 1 PUFF INTO THE LUNGS IN THE MORNING AND AT BEDTIME.   gabapentin  (NEURONTIN ) 100 MG capsule Take 2 capsules (200 mg total) by mouth at bedtime.   Glucose 15 GM/32ML  GEL Take 1 packet by mouth as needed (for low blood sugar).   ipratropium (ATROVENT ) 0.03 % nasal spray PLACE 2 SPRAYS INTO BOTH NOSTRILS 3 (THREE) TIMES DAILY   levothyroxine  (SYNTHROID ) 75 MCG tablet TAKE 1 TABLET BY MOUTH 30 MINS BEFORE EATING WITH A FULL GLASS OF WATER   magnesium hydroxide (MILK OF MAGNESIA) 400 MG/5ML suspension Give 2 Tbsp by mouth as needed for constipation. supervised self-administration daily -- Call MD if no relief in 3 days of continued use.   nystatin (MYCOSTATIN/NYSTOP) powder Apply to excoriated area topically as needed for skin breakdown supervised selfadministration twice daily   ondansetron (ZOFRAN) 4 MG tablet Take 4 mg by mouth 3 (three) times daily as needed for nausea or vomiting.   OXYGEN  Inhale 2 L into the lungs. may start for dyspnea or SOB   verapamil  (CALAN -SR) 120 MG CR tablet TAKE 1 TABLET BY MOUTH EVERY DAY   No facility-administered encounter medications on file as of 08/06/2024.    Review of Systems  Constitutional: Negative.   HENT:  Positive for rhinorrhea. Negative for sore throat and trouble swallowing.   Respiratory:  Positive for cough and shortness of breath. Negative for wheezing.   Cardiovascular:  Positive for leg swelling. Negative for chest pain.  Gastrointestinal: Negative.   Endocrine: Negative.   Genitourinary: Negative.   Musculoskeletal:  Positive for gait problem.  Skin: Negative.   Neurological:  Positive for weakness.  Hematological: Negative.   Psychiatric/Behavioral: Negative.      Immunization History  Administered Date(s) Administered   DTaP 04/30/2004   INFLUENZA, HIGH DOSE SEASONAL PF 06/17/2014   Influenza,inj,Quad PF,6+ Mos 06/22/2013   Influenza-Unspecified 06/26/2015, 06/25/2017, 06/24/2019, 06/16/2020, 06/21/2021, 07/02/2022, 07/18/2023, 06/29/2024   Moderna Covid-19 Vaccine Bivalent Booster 86yrs & up 02/12/2022   Moderna Sars-Covid-2 Vaccination 10/07/2019, 10/28/2019, 07/28/2020   Pfizer Covid-19  Vaccine Bivalent Booster 7yrs & up 06/07/2021   Pfizer(Comirnaty)Fall Seasonal Vaccine 12 years and older 10/08/2023   Pneumococcal Conjugate-13 09/29/2014   Pneumococcal Polysaccharide-23 06/01/2012   Respiratory Syncytial Virus Vaccine,Recomb Aduvanted(Arexvy) 09/30/2022   Unspecified SARS-COV-2 Vaccination 07/09/2024   Varicella 08/06/2007   Zoster Recombinant(Shingrix) 01/17/2022, 03/18/2022   Pertinent  Health Maintenance Due  Topic Date Due   Bone Density Scan  06/02/2017   Influenza Vaccine  Completed      12/09/2023   11:45 AM 01/14/2024   10:42 AM 01/14/2024   10:50 AM 03/10/2024   12:04 PM 03/10/2024   12:17 PM  Fall Risk  Falls in the past year? 1 1 0 0 1  Was there an injury with Fall? 1 1 0 0 1  Fall Risk Category Calculator 2 2 0 0 2  Patient at Risk for Falls Due to Impaired balance/gait;Impaired vision;Impaired mobility No Fall Risks No Fall Risks No Fall Risks History of fall(s)  Fall risk Follow up Education provided;Falls prevention discussed Falls evaluation completed Falls evaluation completed     Functional Status Survey:    Vitals:   08/06/24 0720  BP: (!) 148/87  Pulse: (!) 101  Resp: 19  Temp: (!) 97.4 F (36.3 C)  SpO2: 94%  Weight: 135 lb 6.4 oz (61.4 kg)  Height: 5' 7.75 (1.721 m)   Body mass  index is 20.74 kg/m. Physical Exam Vitals reviewed.  Constitutional:      General: She is not in acute distress. HENT:     Head: Normocephalic.  Eyes:     General:        Right eye: No discharge.        Left eye: No discharge.  Cardiovascular:     Rate and Rhythm: Normal rate and regular rhythm.     Pulses: Normal pulses.     Heart sounds: Normal heart sounds.  Pulmonary:     Effort: Pulmonary effort is normal.     Breath sounds: Normal breath sounds.  Abdominal:     General: Bowel sounds are normal. There is no distension.     Palpations: Abdomen is soft.     Tenderness: There is no abdominal tenderness.  Musculoskeletal:     Cervical  back: Neck supple.     Right lower leg: No edema.     Left lower leg: No edema.  Skin:    General: Skin is warm.     Capillary Refill: Capillary refill takes less than 2 seconds.     Comments: LUE with raised irregular shaped lesion, mild tenderness  Neurological:     General: No focal deficit present.     Mental Status: She is alert and oriented to person, place, and time.     Gait: Gait normal.  Psychiatric:        Mood and Affect: Mood normal.     Labs reviewed: Recent Labs    12/23/23 0852 04/22/24 0000  NA 136 138  K 3.9 4.2  CL 100 104  CO2 27 27*  GLUCOSE 94  --   BUN 16 15  CREATININE 0.68 0.7  CALCIUM 10.2 9.5   Recent Labs    12/23/23 0852 04/22/24 0000  AST 27 20  ALT 23 16  ALKPHOS 87 78  BILITOT 0.8  --   PROT 7.1  --   ALBUMIN 4.4 3.5   Recent Labs    04/22/24 0000  WBC 4.3  NEUTROABS 2,189.00  HGB 13.3  HCT 41  PLT 148*   Lab Results  Component Value Date   TSH 2.04 12/23/2023   Lab Results  Component Value Date   HGBA1C 6.0 09/19/2014   Lab Results  Component Value Date   CHOL 210 (H) 12/23/2023   HDL 78.60 12/23/2023   LDLCALC 109 (H) 12/23/2023   LDLDIRECT 126.5 07/08/2013   TRIG 114.0 12/23/2023   CHOLHDL 3 12/23/2023    Significant Diagnostic Results in last 30 days:  No results found.  Assessment/Plan 1. Pigmented skin lesion suspicious for malignant neoplasm (Primary) - h/o squamous cell skin cancer - suspicious lesion to LUE - in house dermatology consult  2. Longstanding persistent atrial fibrillation (HCC) - HR< 100 with verapamil  - cont Eliquis  for clot prevention  3. Centrilobular emphysema (HCC) - no exacerbations - not using oxygen  - cont fluticasone  inhaler  4. Polyneuropathy, unspecified - stable with gabapentin   5. Chronic venous insufficiency - no edema - cont compression stockings  6. Acquired hypothyroidism - TSH stable with levothyroxine     Family/ staff Communication: plan discussed  with patient and nurse  Labs/tests ordered:  none

## 2024-08-16 DIAGNOSIS — L821 Other seborrheic keratosis: Secondary | ICD-10-CM | POA: Diagnosis not present

## 2024-08-16 DIAGNOSIS — L570 Actinic keratosis: Secondary | ICD-10-CM | POA: Diagnosis not present

## 2024-08-16 DIAGNOSIS — L814 Other melanin hyperpigmentation: Secondary | ICD-10-CM | POA: Diagnosis not present

## 2024-08-31 NOTE — Progress Notes (Deleted)
 Cardiology Office Note  Date:  08/31/2024   ID:  [name], DOB 03/19/1932, MRN [id number]  PCP:  Laurence Locus, DO   No chief complaint on file.   HPI:  [name] is a 89 year old woman with  chronic atrial fibrillation, on warfarin COPD,   40 years of smoking, quit 20 years ago hyperlipidemia Chronic leg pain, weakness [facility] lives [facility] [place]. Chronic leg swelling who presents for routine followup of her atrial fibrillation.   Last seen in clinic 12/24   Lives at [facility] with a cane, gait instability Likes chair volleyball  In follow-up today reports she is doing well overall Rare episodes of SOB, part of the COPD I am much slower, feels her ambulation is limited secondary to neuropathy For COPD, previously seen by pulmonary  on wixela (has to take twice is much), prefers advair Chronic shortness of breath  No open wounds on her legs, no leg edema Denies any chest pain concerning for angina No near-syncope or syncope  EKG personally reviewed by myself on todays visit     Other past medical history reviewed chronic vertigo, balance issues, legs weak Previously seen by [name], Dr. Milissa for vertigo  Prior cardiac notes from Dr. Marsa in [place] indicate she has chronic atrial fibrillation,  echocardiogram in 2010 and March 2014 confirming atrial fibrillation. No old EKGs are available.   Echocardiogram from March 2014 shows normal LV systolic function, severely dilated left and right atrium, mildly elevated right ventricular systolic pressure estimated at 35 mm of mercury  PMH:   has a past medical history of Atrial fibrillation (HCC), COPD (chronic obstructive pulmonary disease) (HCC), Hypothyroidism, Microscopic colitis, Osteoporosis, Squamous cell carcinoma, and Syncope.  PSH:    Past Surgical History:  Procedure Laterality Date   APPENDECTOMY     CATARACT EXTRACTION Bilateral    COLONOSCOPY     LIPOMA EXCISION     MULTIPLE TOOTH  EXTRACTIONS     REFRACTIVE SURGERY     SQUAMOUS CELL CARCINOMA EXCISION  07/10/2017   left shin    Current Outpatient Medications  Medication Sig Dispense Refill   acetaminophen (TYLENOL) 325 MG tablet Take 650 mg by mouth every 4 (four) hours as needed.     alum & mag hydroxide-simeth (MAALOX/MYLANTA) 200-200-20 MG/5ML suspension Give 2 Tbsp by mouth every 4 hours as needed for gas, indigestion, or upset stomach supervised self-administration Notify MD if no relief     apixaban  (ELIQUIS ) 5 MG TABS tablet Take 1 tablet (5 mg total) by mouth 2 (two) times daily. 180 tablet 3   bismuth subsalicylate (PEPTO BISMOL) 262 MG/15ML suspension Give 10 cc by mouth as needed for loose stool supervised self-administration     carbamide peroxide (DEBROX) 6.5 % OTIC solution Place 5 drops into both ears as needed.     cetirizine (ZYRTEC) 5 MG tablet Take 5 mg by mouth as needed for allergies.     dextromethorphan-guaiFENesin (ROBITUSSIN-DM) 10-100 MG/5ML liquid Give 2 tsp by mouth every 4 hours as needed for coughing. supervised self-administration Notify MD if no relief in 3 days or continued use     fluticasone -salmeterol (WIXELA INHUB ) 250-50 MCG/ACT AEPB INHALE 1 PUFF INTO THE LUNGS IN THE MORNING AND AT BEDTIME. 180 each 0   gabapentin  (NEURONTIN ) 100 MG capsule Take 2 capsules (200 mg total) by mouth at bedtime. 180 capsule 1   Glucose 15 GM/32ML GEL Take 1 packet by mouth as needed (for low blood sugar).  ipratropium (ATROVENT ) 0.03 % nasal spray PLACE 2 SPRAYS INTO BOTH NOSTRILS 3 (THREE) TIMES DAILY 90 mL 0   levothyroxine  (SYNTHROID ) 75 MCG tablet TAKE 1 TABLET BY MOUTH 30 MINS BEFORE EATING WITH A FULL GLASS OF WATER 90 tablet 3   magnesium hydroxide (MILK OF MAGNESIA) 400 MG/5ML suspension Give 2 Tbsp by mouth as needed for constipation. supervised self-administration daily -- Call MD if no relief in 3 days of continued use.     nystatin (MYCOSTATIN/NYSTOP) powder Apply to excoriated  area topically as needed for skin breakdown supervised selfadministration twice daily     ondansetron (ZOFRAN) 4 MG tablet Take 4 mg by mouth 3 (three) times daily as needed for nausea or vomiting.     OXYGEN  Inhale 2 L into the lungs. may start for dyspnea or SOB     verapamil  (CALAN -SR) 120 MG CR tablet TAKE 1 TABLET BY MOUTH EVERY DAY 90 tablet 3   No current facility-administered medications for this visit.    Allergies:   Sulfa antibiotics   Social History:  The patient  reports that she quit smoking about 16 years ago. Her smoking use included cigarettes. She started smoking about 56 years ago. She has a 40 pack-year smoking history. She has never used smokeless tobacco. She reports that she does not currently use alcohol. She reports that she does not use drugs.   Family History:   family history includes Alzheimer's disease in her mother; Bladder Cancer in her brother; Brain cancer in her father; Cancer in her father; Cancer (age of onset: 65) in her brother; Skin cancer in her brother.    Review of Systems: Review of Systems  HENT: Negative.    Respiratory: Negative.    Cardiovascular: Negative.   Gastrointestinal: Negative.   Musculoskeletal: Negative.        Poor balance, pain in her feet  Neurological: Negative.   Psychiatric/Behavioral: Negative.    All other systems reviewed and are negative.   PHYSICAL EXAM: VS:  There were no vitals taken for this visit. , BMI There is no height or weight on file to calculate BMI. Constitutional:  oriented to person, place, and time. No distress.  HENT:  Head: Grossly normal Eyes:  no discharge. No scleral icterus.  Neck: No JVD, no carotid bruits  Cardiovascular: Regular rate and rhythm, no murmurs appreciated Pulmonary/Chest: Clear to auscultation bilaterally, no wheezes or rails Abdominal: Soft.  no distension.  no tenderness.  Musculoskeletal: Normal range of motion Neurological:  normal muscle tone. Coordination normal. No  atrophy Skin: Skin warm and dry Psychiatric: normal affect, pleasant   Recent Labs: 12/23/2023: TSH 2.04 04/22/2024: ALT 16; BUN 15; Creatinine 0.7; Hemoglobin 13.3; Platelets 148; Potassium 4.2; Sodium 138    Lipid Panel Lab Results  Component Value Date   CHOL 210 (H) 12/23/2023   HDL 78.60 12/23/2023   LDLCALC 109 (H) 12/23/2023   TRIG 114.0 12/23/2023    Wt Readings from Last 3 Encounters:  08/06/24 135 lb 6.4 oz (61.4 kg)  04/19/24 131 lb 8 oz (59.6 kg)  03/23/24 135 lb 4 oz (61.3 kg)     ASSESSMENT AND PLAN:  Atrial fibrillation, unspecified type (HCC) - Plan: EKG 12-Lead Rate controlled, asymptomatic, Continue verapamil  120 daily for rate control  HLD (hyperlipidemia) No known CAD or PAD On previous discussions, prefers not to be on a statin  Centrilobular emphysema (HCC) Stable, chronic shortness of breath Followed by Dr. Isaiah No recent exacerbation, prefers Advair over current inhaler  Leg pain/neuropathy Followed by podiatry and neurology  Foot drop. Hammertoes  On Neurontin  100 up to 200 mg Significant gait instability   No orders of the defined types were placed in this encounter.    Signed, Velinda Lunger, M.D., Ph.D. 08/31/2024  [facility] [place], [place] [phone]

## 2024-09-03 ENCOUNTER — Ambulatory Visit: Admitting: Cardiovascular Disease

## 2024-09-23 ENCOUNTER — Encounter: Payer: Self-pay | Admitting: Nurse Practitioner

## 2024-09-23 ENCOUNTER — Non-Acute Institutional Stay: Admitting: Nurse Practitioner

## 2024-09-23 DIAGNOSIS — S81801A Unspecified open wound, right lower leg, initial encounter: Secondary | ICD-10-CM | POA: Diagnosis not present

## 2024-09-23 NOTE — Progress Notes (Signed)
 " Location:  Other [facility].  Nursing Home Room Number: [facility] [place] [id number] Place of Service:  ALF [phone]) Harlene An, NP  PCP: Laurence Locus, DO  Patient Care Team: Laurence Locus, DO as PCP - General (Internal Medicine) Isenstein, Arin L, MD (Dermatology) Milissa Hamming, MD as Referring Physician (Otolaryngology) Lenn Standing, MD as Consulting Physician (Ophthalmology) Perla Evalene PARAS, MD as Consulting Physician (Cardiology) Jeri Donnice Burkes, MD as Referring Physician (Gastroenterology) Isenstein, Arin L, MD (Dermatology)  Extended Emergency Contact Information Primary Emergency Contact: Stthomas,Kevin  [place] Mobile Phone: [phone] Relation: Son Secondary Emergency Contact: Hatton,Andrea Mobile Phone: [phone] Relation: Relative  Goals of care: Advanced Directive information    08/06/2024    7:28 AM  Advanced Directives  Does Patient Have a Medical Advance Directive? Yes  Type of Advance Directive Living will;Out of facility DNR (pink MOST or yellow form)  Does patient want to make changes to medical advance directive? No - Patient declined     Chief Complaint  Patient presents with   Leg Wound    Leg Wound    HPI:  Pt is a 89 y.o. female seen today for an acute visit for Leg Wound She noticed a bruise on her leg several weeks ago, then opened to small area. She was using a bandaid that was not effective and area became slightly bigger.  She is unsure if there was any injury- reports if she touches something the wrong way it can cause a bruise.  No pain or increase in redness  Does have some discoloration of LE due to chronic venous insufficiency She uses compression hoses  Past Medical History:  Diagnosis Date   Atrial fibrillation (HCC)    COPD (chronic obstructive pulmonary disease) (HCC)    Hypothyroidism    Microscopic colitis    Osteoporosis    Squamous cell carcinoma    Syncope    Past Surgical History:   Procedure Laterality Date   APPENDECTOMY     CATARACT EXTRACTION Bilateral    COLONOSCOPY     LIPOMA EXCISION     MULTIPLE TOOTH EXTRACTIONS     REFRACTIVE SURGERY     SQUAMOUS CELL CARCINOMA EXCISION  07/10/2017   left shin    Allergies[1]  Outpatient Encounter Medications as of 09/23/2024  Medication Sig   acetaminophen (TYLENOL) 325 MG tablet Take 650 mg by mouth every 4 (four) hours as needed.   alum & mag hydroxide-simeth (MAALOX/MYLANTA) 200-200-20 MG/5ML suspension Give 2 Tbsp by mouth every 4 hours as needed for gas, indigestion, or upset stomach supervised self-administration Notify MD if no relief   apixaban  (ELIQUIS ) 5 MG TABS tablet Take 1 tablet (5 mg total) by mouth 2 (two) times daily.   bismuth subsalicylate (PEPTO BISMOL) 262 MG/15ML suspension Give 10 cc by mouth as needed for loose stool supervised self-administration   carbamide peroxide (DEBROX) 6.5 % OTIC solution Place 5 drops into both ears as needed.   cetirizine (ZYRTEC) 5 MG tablet Take 5 mg by mouth as needed for allergies.   dextromethorphan-guaiFENesin (ROBITUSSIN-DM) 10-100 MG/5ML liquid Give 2 tsp by mouth every 4 hours as needed for coughing. supervised self-administration Notify MD if no relief in 3 days or continued use   fluticasone -salmeterol (WIXELA INHUB ) 250-50 MCG/ACT AEPB INHALE 1 PUFF INTO THE LUNGS IN THE MORNING AND AT BEDTIME.   gabapentin  (NEURONTIN ) 100 MG capsule Take 2 capsules (200 mg total) by mouth at bedtime.   Glucose 15 GM/32ML GEL Take 1 packet by  mouth as needed (for low blood sugar).   ipratropium (ATROVENT ) 0.03 % nasal spray PLACE 2 SPRAYS INTO BOTH NOSTRILS 3 (THREE) TIMES DAILY   levothyroxine  (SYNTHROID ) 75 MCG tablet TAKE 1 TABLET BY MOUTH 30 MINS BEFORE EATING WITH A FULL GLASS OF WATER   magnesium hydroxide (MILK OF MAGNESIA) 400 MG/5ML suspension Give 2 Tbsp by mouth as needed for constipation. supervised self-administration daily -- Call MD if no relief in 3 days  of continued use.   nystatin (MYCOSTATIN/NYSTOP) powder Apply to excoriated area topically as needed for skin breakdown supervised selfadministration twice daily   ondansetron (ZOFRAN) 4 MG tablet Take 4 mg by mouth 3 (three) times daily as needed for nausea or vomiting.   OXYGEN  Inhale 2 L into the lungs. may start for dyspnea or SOB   verapamil  (CALAN -SR) 120 MG CR tablet TAKE 1 TABLET BY MOUTH EVERY DAY   No facility-administered encounter medications on file as of 09/23/2024.    Review of Systems  Constitutional:  Negative for activity change, appetite change, diaphoresis, fatigue and fever.  Skin:  Positive for wound. Negative for color change.    Immunization History  Administered Date(s) Administered   DTaP 04/30/2004   INFLUENZA, HIGH DOSE SEASONAL PF 06/17/2014   Influenza,inj,Quad PF,6+ Mos 06/22/2013   Influenza-Unspecified 06/26/2015, 06/25/2017, 06/24/2019, 06/16/2020, 06/21/2021, 07/02/2022, 07/18/2023, 06/29/2024   Moderna Covid-19 Vaccine Bivalent Booster 41yrs & up 02/12/2022   Moderna Sars-Covid-2 Vaccination 10/07/2019, 10/28/2019, 07/28/2020   Pfizer Covid-19 Vaccine Bivalent Booster 68yrs & up 06/07/2021   Pfizer(Comirnaty)Fall Seasonal Vaccine 12 years and older 10/08/2023   Pneumococcal Conjugate-13 09/29/2014   Pneumococcal Polysaccharide-23 06/01/2012   Respiratory Syncytial Virus Vaccine,Recomb Aduvanted(Arexvy) 09/30/2022   Unspecified SARS-COV-2 Vaccination 07/09/2024   Varicella 08/06/2007   Zoster Recombinant(Shingrix) 01/17/2022, 03/18/2022   Pertinent  Health Maintenance Due  Topic Date Due   Bone Density Scan  06/02/2017   Influenza Vaccine  Completed      01/14/2024   10:42 AM 01/14/2024   10:50 AM 03/10/2024   12:04 PM 03/10/2024   12:17 PM 08/06/2024    4:15 PM  Fall Risk  Falls in the past year? 1 0 0 1 1  Was there an injury with Fall? 1  0  0  1  0   Fall Risk Category Calculator 2 0 0 2 1  Patient at Risk for Falls Due to No Fall Risks  No Fall Risks No Fall Risks History of fall(s) History of fall(s)  Fall risk Follow up Falls evaluation completed Falls evaluation completed   Falls evaluation completed     Data saved with a previous flowsheet row definition   Functional Status Survey:    Vitals:   09/23/24 1111  BP: 119/75  Pulse: 88  Resp: 18  Temp: 97.6 F (36.4 C)  SpO2: 96%  Weight: 135 lb (61.2 kg)  Height: 5' 7.75 (1.721 m)   Body mass index is 20.68 kg/m. Physical Exam Constitutional:      Appearance: Normal appearance.  Pulmonary:     Effort: Pulmonary effort is normal.  Skin:    General: Skin is warm and dry.  Neurological:     Mental Status: She is alert. Mental status is at baseline.  Psychiatric:        Mood and Affect: Mood normal.     Labs reviewed: Recent Labs    12/23/23 0852 04/22/24 0000  NA 136 138  K 3.9 4.2  CL 100 104  CO2 27 27*  GLUCOSE  94  --   BUN 16 15  CREATININE 0.68 0.7  CALCIUM 10.2 9.5   Recent Labs    12/23/23 0852 04/22/24 0000  AST 27 20  ALT 23 16  ALKPHOS 87 78  BILITOT 0.8  --   PROT 7.1  --   ALBUMIN 4.4 3.5   Recent Labs    04/22/24 0000  WBC 4.3  NEUTROABS 2,189.00  HGB 13.3  HCT 41  PLT 148*   Lab Results  Component Value Date   TSH 2.04 12/23/2023   Lab Results  Component Value Date   HGBA1C 6.0 09/19/2014   Lab Results  Component Value Date   CHOL 210 (H) 12/23/2023   HDL 78.60 12/23/2023   LDLCALC 109 (H) 12/23/2023   LDLDIRECT 126.5 07/08/2013   TRIG 114.0 12/23/2023   CHOLHDL 3 12/23/2023    Significant Diagnostic Results in last 30 days:  No results found.  Assessment/Plan 1. Wound of right lower extremity, initial encounter (Primary) No signs of infection Continue dressing changes M, W, F with hydrofera blue foam dressing -continue compression hose  -elevate LE as tolerates.  - nursing doing dressing changes and monitoring.    Torina Ey K. Caro BODILY [facility] [phone]       [1]  Allergies Allergen Reactions   Sulfa Antibiotics Nausea And Vomiting   "

## 2024-10-17 NOTE — Progress Notes (Unsigned)
 Cardiology Office Note  Date:  10/19/2024   ID:  [name], DOB [birth year], MRN [id number]  PCP:  Laurence Locus, DO   Chief Complaint  Patient presents with   12 month follow up     Patient c/o shortness of breath with over exertion.     HPI:  [name] is a 89 year old woman with  chronic atrial fibrillation, on warfarin COPD,   40 years of smoking, quit 20 years ago hyperlipidemia Chronic leg pain, weakness [facility] lives [facility] [place]. Chronic leg swelling neuropathy who presents for routine followup of her permanent atrial fibrillation, shortness of breath/COPD.   Last seen in clinic 12/24 Lives at [facility],  moved to assisted living 02/11/24  [facility] with a cane and walker, gait instability No recent falls  Stopped chair volleyball, was bruising her skin  Followed by Dr. Laurence and nurse practitioner at [facility]  Reports breathing is stable Attributes symptoms to COPD For COPD, previously seen by pulmonary  Chronic shortness of breath symptoms [place] taking wixela, prefers advair  Limited mobility secondary to neuropathy  No open wounds on her legs, no leg edema Denies chest pain or shortness of breath, blood pressure low but denies orthostasis symptoms  EKG personally reviewed by myself on todays visit EKG Interpretation Date/Time:  Tuesday October 19 2024 10:12:01 EST Ventricular Rate:  76 PR Interval:    QRS Duration:  84 QT Interval:  356 QTC Calculation: 400 R Axis:   261  Text Interpretation: Atrial fibrillation Septal infarct (cited on or before 02-Sep-2023) When compared with ECG of 02-Sep-2023 15:00, Nonspecific T wave abnormality, worse in Inferior leads Confirmed by Perla Lye [phone]) on 10/19/2024 10:19:31 AM   Other past medical history reviewed chronic vertigo, balance issues, legs weak Previously seen by [name], Dr. Milissa for vertigo  Prior cardiac notes from Dr. Marsa in [place] indicate she has chronic atrial fibrillation,   echocardiogram in 2010 and March 2014 confirming atrial fibrillation. No old EKGs are available.   Echocardiogram from March 2014 shows normal LV systolic function, severely dilated left and right atrium, mildly elevated right ventricular systolic pressure estimated at 35 mm of mercury  PMH:   has a past medical history of Atrial fibrillation (HCC), COPD (chronic obstructive pulmonary disease) (HCC), Hypothyroidism, Microscopic colitis, Osteoporosis, Squamous cell carcinoma, and Syncope.  PSH:    Past Surgical History:  Procedure Laterality Date   APPENDECTOMY     CATARACT EXTRACTION Bilateral    COLONOSCOPY     LIPOMA EXCISION     MULTIPLE TOOTH EXTRACTIONS     REFRACTIVE SURGERY     SQUAMOUS CELL CARCINOMA EXCISION  07/10/2017   left shin    Current Outpatient Medications  Medication Sig Dispense Refill   apixaban  (ELIQUIS ) 5 MG TABS tablet Take 1 tablet (5 mg total) by mouth 2 (two) times daily. 180 tablet 3   fluticasone -salmeterol (WIXELA INHUB ) 250-50 MCG/ACT AEPB INHALE 1 PUFF INTO THE LUNGS IN THE MORNING AND AT BEDTIME. 180 each 0   gabapentin  (NEURONTIN ) 100 MG capsule Take 2 capsules (200 mg total) by mouth at bedtime. 180 capsule 1   levothyroxine  (SYNTHROID ) 75 MCG tablet TAKE 1 TABLET BY MOUTH 30 MINS BEFORE EATING WITH A FULL GLASS OF WATER 90 tablet 3   verapamil  (CALAN -SR) 120 MG CR tablet TAKE 1 TABLET BY MOUTH EVERY DAY 90 tablet 3   No current facility-administered medications for this visit.    Allergies:   Sulfa antibiotics   Social History:  The patient  reports that she quit smoking about 17 years ago. Her smoking use included cigarettes. She started smoking about 57 years ago. She has a 40 pack-year smoking history. She has never used smokeless tobacco. She reports that she does not currently use alcohol. She reports that she does not use drugs.   Family History:   family history includes Alzheimer's disease in her mother; Bladder Cancer in her brother;  Brain cancer in her father; Cancer in her father; Cancer (age of onset: 58) in her brother; Skin cancer in her brother.    Review of Systems: Review of Systems  HENT: Negative.    Respiratory: Negative.    Cardiovascular: Negative.   Gastrointestinal: Negative.   Musculoskeletal: Negative.        Poor balance, pain in her feet  Neurological: Negative.   Psychiatric/Behavioral: Negative.    All other systems reviewed and are negative.   PHYSICAL EXAM: VS:  BP 98/68 (BP Location: Left Arm, Patient Position: Sitting, Cuff Size: Normal)   Pulse 76   Ht 5' 7 (1.702 m)   Wt 137 lb 6 oz (62.3 kg)   SpO2 98%   BMI 21.52 kg/m  , BMI Body mass index is 21.52 kg/m. Constitutional:  oriented to person, place, and time. No distress.  HENT:  Head: Grossly normal Eyes:  no discharge. No scleral icterus.  Neck: No JVD, no carotid bruits  Cardiovascular: Regular rate and rhythm, no murmurs appreciated Pulmonary/Chest: Clear to auscultation bilaterally, no wheezes or rails Abdominal: Soft.  no distension.  no tenderness.  Musculoskeletal: Normal range of motion Neurological:  normal muscle tone. Coordination normal. No atrophy Skin: Skin warm and dry Psychiatric: normal affect, pleasant   Recent Labs: 12/23/2023: TSH 2.04 04/22/2024: ALT 16; BUN 15; Creatinine 0.7; Hemoglobin 13.3; Platelets 148; Potassium 4.2; Sodium 138    Lipid Panel Lab Results  Component Value Date   CHOL 210 (H) 12/23/2023   HDL 78.60 12/23/2023   LDLCALC 109 (H) 12/23/2023   TRIG 114.0 12/23/2023    Wt Readings from Last 3 Encounters:  10/19/24 137 lb 6 oz (62.3 kg)  09/23/24 135 lb (61.2 kg)  08/06/24 135 lb 6.4 oz (61.4 kg)     ASSESSMENT AND PLAN:  Atrial fibrillation, unspecified type (HCC) -  Rate controlled, asymptomatic, Tolerating Eliquis  5 twice daily Does not meet criteria for reduced dosing but if weight continues to drop less than 60 kg, she would qualify for Eliquis  2.5 twice daily.   Weight today 63 kg Continue verapamil  120 daily for rate control Blood pressure low but denies orthostasis symptoms, will continue to monitor  HLD (hyperlipidemia) No known CAD or PAD Previously elected not to be on a statin  Centrilobular emphysema (HCC) Stable, chronic shortness of breath Followed by Dr. Isaiah Will defer inhalers to pulmonary  Leg pain/neuropathy Followed by podiatry and neurology  Foot drop. Hammertoes  Taking Neurontin  100 up to 200 mg Significant gait instability, uses a walker   Orders Placed This Encounter  Procedures   EKG 12-Lead     Signed, Velinda Lunger, M.D., Ph.D. 10/19/2024  [facility] [place], [place] [phone]

## 2024-10-19 ENCOUNTER — Encounter: Payer: Self-pay | Admitting: Cardiovascular Disease

## 2024-10-19 ENCOUNTER — Ambulatory Visit: Admitting: Cardiovascular Disease

## 2024-10-19 VITALS — BP 98/68 | HR 76 | Ht 67.0 in | Wt 137.4 lb

## 2024-10-19 DIAGNOSIS — E782 Mixed hyperlipidemia: Secondary | ICD-10-CM | POA: Diagnosis not present

## 2024-10-19 DIAGNOSIS — I4821 Permanent atrial fibrillation: Secondary | ICD-10-CM

## 2024-10-19 DIAGNOSIS — J432 Centrilobular emphysema: Secondary | ICD-10-CM

## 2024-10-19 MED ORDER — VERAPAMIL HCL ER 120 MG PO TBCR: EXTENDED_RELEASE_TABLET | ORAL | 3 refills | Status: AC

## 2024-10-19 NOTE — Patient Instructions (Signed)

## 2024-12-09 ENCOUNTER — Ambulatory Visit

## 2024-12-10 ENCOUNTER — Ambulatory Visit

## 2024-12-30 ENCOUNTER — Other Ambulatory Visit

## 2025-01-06 ENCOUNTER — Encounter: Admitting: Family Medicine
